# Patient Record
Sex: Male | Born: 1965 | Race: White | Hispanic: No | Smoking: Former smoker
Health system: Southern US, Community
[De-identification: ages and names within clinical notes are randomized; demographics above are authoritative.]

## PROBLEM LIST (undated history)

## (undated) DIAGNOSIS — L089 Local infection of the skin and subcutaneous tissue, unspecified: Secondary | ICD-10-CM

## (undated) DIAGNOSIS — N289 Disorder of kidney and ureter, unspecified: Secondary | ICD-10-CM

## (undated) DIAGNOSIS — L97323 Non-pressure chronic ulcer of left ankle with necrosis of muscle: Secondary | ICD-10-CM

## (undated) DIAGNOSIS — I10 Essential (primary) hypertension: Secondary | ICD-10-CM

## (undated) DIAGNOSIS — L98492 Non-pressure chronic ulcer of skin of other sites with fat layer exposed: Secondary | ICD-10-CM

## (undated) DIAGNOSIS — C801 Malignant (primary) neoplasm, unspecified: Secondary | ICD-10-CM

## (undated) DIAGNOSIS — M86172 Other acute osteomyelitis, left ankle and foot: Secondary | ICD-10-CM

## (undated) DIAGNOSIS — E119 Type 2 diabetes mellitus without complications: Secondary | ICD-10-CM

## (undated) DIAGNOSIS — Z22322 Carrier or suspected carrier of Methicillin resistant Staphylococcus aureus: Secondary | ICD-10-CM

## (undated) DIAGNOSIS — E11628 Type 2 diabetes mellitus with other skin complications: Secondary | ICD-10-CM

## (undated) DIAGNOSIS — H547 Unspecified visual loss: Secondary | ICD-10-CM

## (undated) HISTORY — PX: SKIN GRAFT: SHX250

## (undated) HISTORY — DX: Non-pressure chronic ulcer of skin of other sites with fat layer exposed: L98.492

## (undated) HISTORY — DX: Non-pressure chronic ulcer of left ankle with necrosis of muscle: L97.323

## (undated) HISTORY — DX: Local infection of the skin and subcutaneous tissue, unspecified: L08.9

## (undated) HISTORY — DX: Other acute osteomyelitis, left ankle and foot: M86.172

## (undated) HISTORY — PX: TOE AMPUTATION: SHX809

---

## 2003-11-15 ENCOUNTER — Emergency Department (HOSPITAL_COMMUNITY): Admission: EM | Admit: 2003-11-15 | Discharge: 2003-11-15 | Payer: Self-pay | Admitting: Emergency Medicine

## 2004-08-12 HISTORY — PX: KIDNEY TRANSPLANT: SHX239

## 2007-07-14 ENCOUNTER — Ambulatory Visit: Payer: Self-pay | Admitting: Pain Medicine

## 2010-09-12 ENCOUNTER — Ambulatory Visit: Payer: BC Managed Care – PPO

## 2010-09-12 DIAGNOSIS — J069 Acute upper respiratory infection, unspecified: Secondary | ICD-10-CM

## 2010-09-12 DIAGNOSIS — J209 Acute bronchitis, unspecified: Secondary | ICD-10-CM

## 2010-09-20 ENCOUNTER — Encounter: Payer: Self-pay | Admitting: Internal Medicine

## 2010-09-27 NOTE — Letter (Signed)
Summary: medication list  medication list   Imported By: Orma Flaming 09/20/2010 10:22:09  _____________________________________________________________________  External Attachment:    Type:   Image     Comment:   External Document

## 2010-09-27 NOTE — Progress Notes (Signed)
Summary: Office Visit-09/12/10  Office Visit-09/12/10   Imported By: Orma Flaming 09/20/2010 10:16:59  _____________________________________________________________________  External Attachment:    Type:   Image     Comment:   External Document

## 2010-09-27 NOTE — Progress Notes (Signed)
Summary: office visit-09/12/10  office visit-09/12/10   Imported By: Orma Flaming 09/20/2010 10:18:51  _____________________________________________________________________  External Attachment:    Type:   Image     Comment:   External Document

## 2013-04-20 ENCOUNTER — Emergency Department: Payer: Self-pay | Admitting: Emergency Medicine

## 2013-04-20 LAB — COMPREHENSIVE METABOLIC PANEL
Alkaline Phosphatase: 105 U/L (ref 50–136)
BUN: 16 mg/dL (ref 7–18)
Bilirubin,Total: 0.7 mg/dL (ref 0.2–1.0)
Calcium, Total: 10 mg/dL (ref 8.5–10.1)
Chloride: 104 mmol/L (ref 98–107)
Creatinine: 1.69 mg/dL — ABNORMAL HIGH (ref 0.60–1.30)
EGFR (Non-African Amer.): 48 — ABNORMAL LOW
Glucose: 94 mg/dL (ref 65–99)
Potassium: 4.1 mmol/L (ref 3.5–5.1)
SGOT(AST): 22 U/L (ref 15–37)
SGPT (ALT): 17 U/L (ref 12–78)
Sodium: 133 mmol/L — ABNORMAL LOW (ref 136–145)
Total Protein: 8.2 g/dL (ref 6.4–8.2)

## 2013-04-20 LAB — URINALYSIS, COMPLETE
Glucose,UR: NEGATIVE mg/dL (ref 0–75)
Leukocyte Esterase: NEGATIVE
Nitrite: NEGATIVE
RBC,UR: NONE SEEN /HPF (ref 0–5)

## 2013-04-20 LAB — CBC
HCT: 46.9 % (ref 40.0–52.0)
RDW: 14.1 % (ref 11.5–14.5)
WBC: 29.3 10*3/uL — ABNORMAL HIGH (ref 3.8–10.6)

## 2013-04-21 LAB — COMPREHENSIVE METABOLIC PANEL
Albumin: 3.5 g/dL (ref 3.4–5.0)
Calcium, Total: 9 mg/dL (ref 8.5–10.1)
Creatinine: 1.88 mg/dL — ABNORMAL HIGH (ref 0.60–1.30)
EGFR (African American): 49 — ABNORMAL LOW
Osmolality: 277 (ref 275–301)
Sodium: 134 mmol/L — ABNORMAL LOW (ref 136–145)
Total Protein: 6.9 g/dL (ref 6.4–8.2)

## 2013-04-21 LAB — CBC WITH DIFFERENTIAL/PLATELET
Basophil %: 0.2 %
Eosinophil %: 0.1 %
Lymphocyte #: 4.8 10*3/uL — ABNORMAL HIGH (ref 1.0–3.6)
MCH: 29.4 pg (ref 26.0–34.0)
MCHC: 33.3 g/dL (ref 32.0–36.0)
Neutrophil %: 75.5 %
RBC: 4.71 10*6/uL (ref 4.40–5.90)

## 2013-04-25 LAB — CULTURE, BLOOD (SINGLE)

## 2013-06-19 ENCOUNTER — Emergency Department: Payer: Self-pay | Admitting: Emergency Medicine

## 2013-06-19 LAB — COMPREHENSIVE METABOLIC PANEL
Albumin: 4.2 g/dL (ref 3.4–5.0)
Anion Gap: 3 — ABNORMAL LOW (ref 7–16)
Bilirubin,Total: 0.9 mg/dL (ref 0.2–1.0)
Calcium, Total: 9.9 mg/dL (ref 8.5–10.1)
Creatinine: 0.82 mg/dL (ref 0.60–1.30)
EGFR (African American): >60
EGFR (Non-African Amer.): >60
Potassium: 3.9 mmol/L (ref 3.5–5.1)
SGOT(AST): 27 U/L (ref 15–37)
SGPT (ALT): 23 U/L (ref 12–78)
Sodium: 119 mmol/L — CL (ref 136–145)
Total Protein: 8 g/dL (ref 6.4–8.2)

## 2013-06-19 LAB — CBC
HCT: 39.1 % — ABNORMAL LOW (ref 40.0–52.0)
MCHC: 35 g/dL (ref 32.0–36.0)
MCV: 84 fL (ref 80–100)
RDW: 14.1 % (ref 11.5–14.5)
WBC: 19.8 10*3/uL — ABNORMAL HIGH (ref 3.8–10.6)

## 2013-06-19 LAB — URINALYSIS, COMPLETE
Bacteria: NONE SEEN
Bilirubin,UR: NEGATIVE
Glucose,UR: NEGATIVE mg/dL (ref 0–75)
Leukocyte Esterase: NEGATIVE
Nitrite: NEGATIVE
Specific Gravity: 1.005 (ref 1.003–1.030)
WBC UR: 1 /HPF (ref 0–5)

## 2017-08-21 ENCOUNTER — Encounter (HOSPITAL_COMMUNITY): Payer: Self-pay | Admitting: Emergency Medicine

## 2017-08-21 ENCOUNTER — Emergency Department (HOSPITAL_COMMUNITY): Payer: Medicare Other

## 2017-08-21 ENCOUNTER — Inpatient Hospital Stay (HOSPITAL_COMMUNITY)
Admission: EM | Admit: 2017-08-21 | Discharge: 2017-08-26 | DRG: 617 | Disposition: A | Discharge status: Home / Self Care | Payer: Medicare Other | Attending: Oncology | Admitting: Oncology

## 2017-08-21 ENCOUNTER — Other Ambulatory Visit: Payer: Self-pay

## 2017-08-21 DIAGNOSIS — I129 Hypertensive chronic kidney disease with stage 1 through stage 4 chronic kidney disease, or unspecified chronic kidney disease: Secondary | ICD-10-CM | POA: Diagnosis present

## 2017-08-21 DIAGNOSIS — M86172 Other acute osteomyelitis, left ankle and foot: Secondary | ICD-10-CM | POA: Diagnosis present

## 2017-08-21 DIAGNOSIS — H547 Unspecified visual loss: Secondary | ICD-10-CM | POA: Diagnosis present

## 2017-08-21 DIAGNOSIS — W19XXXA Unspecified fall, initial encounter: Secondary | ICD-10-CM | POA: Diagnosis not present

## 2017-08-21 DIAGNOSIS — I10 Essential (primary) hypertension: Secondary | ICD-10-CM | POA: Diagnosis not present

## 2017-08-21 DIAGNOSIS — E1022 Type 1 diabetes mellitus with diabetic chronic kidney disease: Secondary | ICD-10-CM | POA: Diagnosis present

## 2017-08-21 DIAGNOSIS — Z823 Family history of stroke: Secondary | ICD-10-CM | POA: Diagnosis not present

## 2017-08-21 DIAGNOSIS — Z888 Allergy status to other drugs, medicaments and biological substances status: Secondary | ICD-10-CM

## 2017-08-21 DIAGNOSIS — I70209 Unspecified atherosclerosis of native arteries of extremities, unspecified extremity: Secondary | ICD-10-CM

## 2017-08-21 DIAGNOSIS — M869 Osteomyelitis, unspecified: Secondary | ICD-10-CM | POA: Diagnosis not present

## 2017-08-21 DIAGNOSIS — Z794 Long term (current) use of insulin: Secondary | ICD-10-CM

## 2017-08-21 DIAGNOSIS — S88112D Complete traumatic amputation at level between knee and ankle, left lower leg, subsequent encounter: Secondary | ICD-10-CM | POA: Diagnosis not present

## 2017-08-21 DIAGNOSIS — L97323 Non-pressure chronic ulcer of left ankle with necrosis of muscle: Secondary | ICD-10-CM | POA: Diagnosis present

## 2017-08-21 DIAGNOSIS — I70208 Unspecified atherosclerosis of native arteries of extremities, other extremity: Secondary | ICD-10-CM | POA: Diagnosis not present

## 2017-08-21 DIAGNOSIS — B955 Unspecified streptococcus as the cause of diseases classified elsewhere: Secondary | ICD-10-CM | POA: Diagnosis not present

## 2017-08-21 DIAGNOSIS — N186 End stage renal disease: Secondary | ICD-10-CM | POA: Diagnosis not present

## 2017-08-21 DIAGNOSIS — L97429 Non-pressure chronic ulcer of left heel and midfoot with unspecified severity: Secondary | ICD-10-CM | POA: Diagnosis not present

## 2017-08-21 DIAGNOSIS — E785 Hyperlipidemia, unspecified: Secondary | ICD-10-CM | POA: Diagnosis present

## 2017-08-21 DIAGNOSIS — D649 Anemia, unspecified: Secondary | ICD-10-CM | POA: Diagnosis not present

## 2017-08-21 DIAGNOSIS — N183 Chronic kidney disease, stage 3 unspecified: Secondary | ICD-10-CM

## 2017-08-21 DIAGNOSIS — D72829 Elevated white blood cell count, unspecified: Secondary | ICD-10-CM | POA: Diagnosis not present

## 2017-08-21 DIAGNOSIS — D631 Anemia in chronic kidney disease: Secondary | ICD-10-CM | POA: Diagnosis present

## 2017-08-21 DIAGNOSIS — K5900 Constipation, unspecified: Secondary | ICD-10-CM | POA: Diagnosis present

## 2017-08-21 DIAGNOSIS — L97322 Non-pressure chronic ulcer of left ankle with fat layer exposed: Secondary | ICD-10-CM | POA: Diagnosis present

## 2017-08-21 DIAGNOSIS — M5430 Sciatica, unspecified side: Secondary | ICD-10-CM | POA: Diagnosis present

## 2017-08-21 DIAGNOSIS — N179 Acute kidney failure, unspecified: Secondary | ICD-10-CM | POA: Diagnosis present

## 2017-08-21 DIAGNOSIS — Z79899 Other long term (current) drug therapy: Secondary | ICD-10-CM | POA: Diagnosis not present

## 2017-08-21 DIAGNOSIS — N189 Chronic kidney disease, unspecified: Secondary | ICD-10-CM | POA: Diagnosis not present

## 2017-08-21 DIAGNOSIS — D62 Acute posthemorrhagic anemia: Secondary | ICD-10-CM

## 2017-08-21 DIAGNOSIS — M549 Dorsalgia, unspecified: Secondary | ICD-10-CM | POA: Diagnosis not present

## 2017-08-21 DIAGNOSIS — E10621 Type 1 diabetes mellitus with foot ulcer: Secondary | ICD-10-CM | POA: Diagnosis not present

## 2017-08-21 DIAGNOSIS — E1069 Type 1 diabetes mellitus with other specified complication: Principal | ICD-10-CM | POA: Diagnosis present

## 2017-08-21 DIAGNOSIS — E1142 Type 2 diabetes mellitus with diabetic polyneuropathy: Secondary | ICD-10-CM | POA: Diagnosis not present

## 2017-08-21 DIAGNOSIS — R1030 Lower abdominal pain, unspecified: Secondary | ICD-10-CM | POA: Diagnosis not present

## 2017-08-21 DIAGNOSIS — E1042 Type 1 diabetes mellitus with diabetic polyneuropathy: Secondary | ICD-10-CM | POA: Diagnosis present

## 2017-08-21 DIAGNOSIS — R7881 Bacteremia: Secondary | ICD-10-CM | POA: Diagnosis present

## 2017-08-21 DIAGNOSIS — G8929 Other chronic pain: Secondary | ICD-10-CM | POA: Diagnosis not present

## 2017-08-21 DIAGNOSIS — Z940 Kidney transplant status: Secondary | ICD-10-CM

## 2017-08-21 DIAGNOSIS — Z89512 Acquired absence of left leg below knee: Secondary | ICD-10-CM

## 2017-08-21 DIAGNOSIS — R1032 Left lower quadrant pain: Secondary | ICD-10-CM | POA: Diagnosis not present

## 2017-08-21 DIAGNOSIS — Z978 Presence of other specified devices: Secondary | ICD-10-CM | POA: Diagnosis not present

## 2017-08-21 DIAGNOSIS — G8918 Other acute postprocedural pain: Secondary | ICD-10-CM | POA: Diagnosis not present

## 2017-08-21 DIAGNOSIS — E1040 Type 1 diabetes mellitus with diabetic neuropathy, unspecified: Secondary | ICD-10-CM | POA: Diagnosis present

## 2017-08-21 DIAGNOSIS — S88112S Complete traumatic amputation at level between knee and ankle, left lower leg, sequela: Secondary | ICD-10-CM | POA: Diagnosis not present

## 2017-08-21 DIAGNOSIS — Z9483 Pancreas transplant status: Secondary | ICD-10-CM | POA: Diagnosis not present

## 2017-08-21 DIAGNOSIS — E10319 Type 1 diabetes mellitus with unspecified diabetic retinopathy without macular edema: Secondary | ICD-10-CM | POA: Diagnosis not present

## 2017-08-21 DIAGNOSIS — Z8614 Personal history of Methicillin resistant Staphylococcus aureus infection: Secondary | ICD-10-CM

## 2017-08-21 DIAGNOSIS — E1051 Type 1 diabetes mellitus with diabetic peripheral angiopathy without gangrene: Secondary | ICD-10-CM | POA: Diagnosis not present

## 2017-08-21 DIAGNOSIS — Z7952 Long term (current) use of systemic steroids: Secondary | ICD-10-CM

## 2017-08-21 DIAGNOSIS — E1049 Type 1 diabetes mellitus with other diabetic neurological complication: Secondary | ICD-10-CM | POA: Diagnosis not present

## 2017-08-21 DIAGNOSIS — I120 Hypertensive chronic kidney disease with stage 5 chronic kidney disease or end stage renal disease: Secondary | ICD-10-CM | POA: Diagnosis not present

## 2017-08-21 DIAGNOSIS — B954 Other streptococcus as the cause of diseases classified elsewhere: Secondary | ICD-10-CM | POA: Diagnosis not present

## 2017-08-21 DIAGNOSIS — Z4781 Encounter for orthopedic aftercare following surgical amputation: Secondary | ICD-10-CM | POA: Diagnosis not present

## 2017-08-21 DIAGNOSIS — L98492 Non-pressure chronic ulcer of skin of other sites with fat layer exposed: Secondary | ICD-10-CM

## 2017-08-21 DIAGNOSIS — Z8249 Family history of ischemic heart disease and other diseases of the circulatory system: Secondary | ICD-10-CM

## 2017-08-21 DIAGNOSIS — E10622 Type 1 diabetes mellitus with other skin ulcer: Secondary | ICD-10-CM | POA: Diagnosis not present

## 2017-08-21 DIAGNOSIS — Z90410 Acquired total absence of pancreas: Secondary | ICD-10-CM | POA: Diagnosis not present

## 2017-08-21 DIAGNOSIS — Z833 Family history of diabetes mellitus: Secondary | ICD-10-CM

## 2017-08-21 DIAGNOSIS — E871 Hypo-osmolality and hyponatremia: Secondary | ICD-10-CM | POA: Diagnosis not present

## 2017-08-21 DIAGNOSIS — L97529 Non-pressure chronic ulcer of other part of left foot with unspecified severity: Secondary | ICD-10-CM | POA: Diagnosis not present

## 2017-08-21 DIAGNOSIS — L089 Local infection of the skin and subcutaneous tissue, unspecified: Secondary | ICD-10-CM | POA: Diagnosis present

## 2017-08-21 DIAGNOSIS — E891 Postprocedural hypoinsulinemia: Secondary | ICD-10-CM | POA: Diagnosis not present

## 2017-08-21 DIAGNOSIS — L03116 Cellulitis of left lower limb: Secondary | ICD-10-CM | POA: Diagnosis not present

## 2017-08-21 HISTORY — DX: Disorder of kidney and ureter, unspecified: N28.9

## 2017-08-21 HISTORY — DX: Essential (primary) hypertension: I10

## 2017-08-21 HISTORY — DX: Local infection of the skin and subcutaneous tissue, unspecified: E11.628

## 2017-08-21 HISTORY — DX: Unspecified visual loss: H54.7

## 2017-08-21 HISTORY — DX: Carrier or suspected carrier of methicillin resistant Staphylococcus aureus: Z22.322

## 2017-08-21 HISTORY — DX: Type 2 diabetes mellitus with other skin complications: L08.9

## 2017-08-21 HISTORY — DX: Type 2 diabetes mellitus without complications: E11.9

## 2017-08-21 LAB — COMPREHENSIVE METABOLIC PANEL
ALBUMIN: 2.7 g/dL — AB (ref 3.5–5.0)
ALT: 9 U/L — ABNORMAL LOW (ref 17–63)
ANION GAP: 11 (ref 5–15)
AST: 13 U/L — ABNORMAL LOW (ref 15–41)
Alkaline Phosphatase: 116 U/L (ref 38–126)
BILIRUBIN TOTAL: 1 mg/dL (ref 0.3–1.2)
BUN: 17 mg/dL (ref 6–20)
CO2: 20 mmol/L — ABNORMAL LOW (ref 22–32)
Calcium: 8.7 mg/dL — ABNORMAL LOW (ref 8.9–10.3)
Chloride: 98 mmol/L — ABNORMAL LOW (ref 101–111)
Creatinine, Ser: 1.26 mg/dL — ABNORMAL HIGH (ref 0.61–1.24)
GFR calc non Af Amer: >60 mL/min (ref >60)
GLUCOSE: 312 mg/dL — AB (ref 65–99)
POTASSIUM: 4.2 mmol/L (ref 3.5–5.1)
SODIUM: 129 mmol/L — AB (ref 135–145)
Total Protein: 6.5 g/dL (ref 6.5–8.1)

## 2017-08-21 LAB — CBC WITH DIFFERENTIAL/PLATELET
BASOS ABS: 0 10*3/uL (ref 0.0–0.1)
BASOS PCT: 0 %
Eosinophils Absolute: 0.1 10*3/uL (ref 0.0–0.7)
Eosinophils Relative: 0 %
HEMATOCRIT: 34.5 % — AB (ref 39.0–52.0)
HEMOGLOBIN: 11.5 g/dL — AB (ref 13.0–17.0)
LYMPHS PCT: 6 %
Lymphs Abs: 1.1 10*3/uL (ref 0.7–4.0)
MCH: 28.4 pg (ref 26.0–34.0)
MCHC: 33.3 g/dL (ref 30.0–36.0)
MCV: 85.2 fL (ref 78.0–100.0)
MONO ABS: 0.5 10*3/uL (ref 0.1–1.0)
Monocytes Relative: 3 %
NEUTROS ABS: 16.5 10*3/uL — AB (ref 1.7–7.7)
NEUTROS PCT: 91 %
Platelets: 402 10*3/uL — ABNORMAL HIGH (ref 150–400)
RBC: 4.05 MIL/uL — AB (ref 4.22–5.81)
RDW: 13.2 % (ref 11.5–15.5)
WBC: 18.2 10*3/uL — ABNORMAL HIGH (ref 4.0–10.5)

## 2017-08-21 LAB — HEMOGLOBIN A1C
HEMOGLOBIN A1C: 8.6 % — AB (ref 4.8–5.6)
MEAN PLASMA GLUCOSE: 200.12 mg/dL

## 2017-08-21 LAB — GLUCOSE, CAPILLARY: GLUCOSE-CAPILLARY: 97 mg/dL (ref 65–99)

## 2017-08-21 LAB — I-STAT CG4 LACTIC ACID, ED: Lactic Acid, Venous: 1.34 mmol/L (ref 0.5–1.9)

## 2017-08-21 LAB — LIPASE, BLOOD: Lipase: 16 U/L (ref 11–51)

## 2017-08-21 MED ORDER — ACETAMINOPHEN 325 MG PO TABS
650.0000 mg | ORAL_TABLET | Freq: Once | ORAL | Status: AC
  Administered 2017-08-21: 650 mg via ORAL
  Filled 2017-08-21: qty 2

## 2017-08-21 MED ORDER — SODIUM CHLORIDE 0.9 % IV SOLN: 250.0000 mL | INTRAVENOUS | Status: DC | PRN

## 2017-08-21 MED ORDER — HYDROMORPHONE HCL 1 MG/ML IJ SOLN
1.0000 mg | Freq: Once | INTRAMUSCULAR | Status: AC
  Administered 2017-08-21: 1 mg via INTRAVENOUS
  Filled 2017-08-21: qty 1

## 2017-08-21 MED ORDER — VANCOMYCIN HCL IN DEXTROSE 1-5 GM/200ML-% IV SOLN: 1000.0000 mg | Freq: Once | INTRAVENOUS | Status: DC

## 2017-08-21 MED ORDER — PIPERACILLIN-TAZOBACTAM 3.375 G IVPB 30 MIN
3.3750 g | Freq: Once | INTRAVENOUS | Status: AC
  Administered 2017-08-21: 3.375 g via INTRAVENOUS
  Filled 2017-08-21: qty 50

## 2017-08-21 MED ORDER — SODIUM CHLORIDE 0.9% FLUSH
3.0000 mL | Freq: Two times a day (BID) | INTRAVENOUS | Status: DC
  Administered 2017-08-21 – 2017-08-23 (×2): 3 mL via INTRAVENOUS

## 2017-08-21 MED ORDER — CARVEDILOL 25 MG PO TABS
25.0000 mg | ORAL_TABLET | Freq: Two times a day (BID) | ORAL | Status: DC
  Administered 2017-08-21 – 2017-08-26 (×10): 25 mg via ORAL
  Filled 2017-08-21 (×11): qty 1

## 2017-08-21 MED ORDER — SODIUM CHLORIDE 0.9% FLUSH
3.0000 mL | Freq: Two times a day (BID) | INTRAVENOUS | Status: DC
  Administered 2017-08-22 – 2017-08-26 (×3): 3 mL via INTRAVENOUS

## 2017-08-21 MED ORDER — PIPERACILLIN-TAZOBACTAM 3.375 G IVPB
3.3750 g | Freq: Three times a day (TID) | INTRAVENOUS | Status: DC
  Administered 2017-08-21: 3.375 g via INTRAVENOUS
  Filled 2017-08-21: qty 50

## 2017-08-21 MED ORDER — HYDROMORPHONE HCL 1 MG/ML IJ SOLN
1.0000 mg | Freq: Once | INTRAMUSCULAR | Status: AC
Start: 2017-08-21 — End: 2017-08-21
  Administered 2017-08-21: 1 mg via INTRAVENOUS
  Filled 2017-08-21: qty 1

## 2017-08-21 MED ORDER — ONDANSETRON HCL 4 MG/2ML IJ SOLN
4.0000 mg | Freq: Once | INTRAMUSCULAR | Status: AC
  Administered 2017-08-21: 4 mg via INTRAVENOUS
  Filled 2017-08-21: qty 2

## 2017-08-21 MED ORDER — VITAMIN B-12 1000 MCG PO TABS
500.0000 ug | ORAL_TABLET | Freq: Every day | ORAL | Status: DC
  Administered 2017-08-22 – 2017-08-26 (×5): 500 ug via ORAL
  Filled 2017-08-21 (×5): qty 1

## 2017-08-21 MED ORDER — PANTOPRAZOLE SODIUM 40 MG PO TBEC
40.0000 mg | DELAYED_RELEASE_TABLET | Freq: Every day | ORAL | Status: DC
  Administered 2017-08-21 – 2017-08-26 (×6): 40 mg via ORAL
  Filled 2017-08-21 (×6): qty 1

## 2017-08-21 MED ORDER — VANCOMYCIN HCL IN DEXTROSE 1-5 GM/200ML-% IV SOLN
1000.0000 mg | Freq: Two times a day (BID) | INTRAVENOUS | Status: DC
  Administered 2017-08-21 – 2017-08-24 (×6): 1000 mg via INTRAVENOUS
  Filled 2017-08-21 (×6): qty 200

## 2017-08-21 MED ORDER — MYCOPHENOLATE MOFETIL 250 MG PO CAPS
500.0000 mg | ORAL_CAPSULE | Freq: Two times a day (BID) | ORAL | Status: DC
  Administered 2017-08-21 – 2017-08-26 (×11): 500 mg via ORAL
  Filled 2017-08-21 (×12): qty 2

## 2017-08-21 MED ORDER — RAMIPRIL 10 MG PO CAPS
20.0000 mg | ORAL_CAPSULE | Freq: Every day | ORAL | Status: DC
Start: 2017-08-21 — End: 2017-08-26
  Administered 2017-08-21 – 2017-08-26 (×6): 20 mg via ORAL
  Filled 2017-08-21 (×6): qty 2

## 2017-08-21 MED ORDER — DOCUSATE SODIUM 100 MG PO CAPS
100.0000 mg | ORAL_CAPSULE | Freq: Two times a day (BID) | ORAL | Status: DC
  Administered 2017-08-21 – 2017-08-22 (×3): 100 mg via ORAL
  Filled 2017-08-21 (×3): qty 1

## 2017-08-21 MED ORDER — ENOXAPARIN SODIUM 40 MG/0.4ML ~~LOC~~ SOLN: 40.0000 mg | SUBCUTANEOUS | Status: DC

## 2017-08-21 MED ORDER — INSULIN ASPART 100 UNIT/ML ~~LOC~~ SOLN
0.0000 [IU] | Freq: Three times a day (TID) | SUBCUTANEOUS | Status: DC
Start: 2017-08-21 — End: 2017-08-21

## 2017-08-21 MED ORDER — INSULIN GLARGINE 100 UNIT/ML ~~LOC~~ SOLN
28.0000 [IU] | Freq: Every day | SUBCUTANEOUS | Status: DC
  Administered 2017-08-21: 28 [IU] via SUBCUTANEOUS
  Filled 2017-08-21 (×2): qty 0.28

## 2017-08-21 MED ORDER — OXYCODONE HCL 5 MG PO TABS
15.0000 mg | ORAL_TABLET | ORAL | Status: DC | PRN
  Administered 2017-08-21 – 2017-08-23 (×7): 15 mg via ORAL
  Filled 2017-08-21 (×7): qty 3

## 2017-08-21 MED ORDER — TACROLIMUS 1 MG PO CAPS
1.0000 mg | ORAL_CAPSULE | Freq: Two times a day (BID) | ORAL | Status: DC
  Administered 2017-08-21 – 2017-08-26 (×11): 1 mg via ORAL
  Filled 2017-08-21 (×12): qty 1

## 2017-08-21 MED ORDER — INSULIN ASPART 100 UNIT/ML ~~LOC~~ SOLN: 0.0000 [IU] | Freq: Every day | SUBCUTANEOUS | Status: DC

## 2017-08-21 MED ORDER — INSULIN ASPART 100 UNIT/ML ~~LOC~~ SOLN
0.0000 [IU] | Freq: Three times a day (TID) | SUBCUTANEOUS | Status: DC
Start: 2017-08-21 — End: 2017-08-23
  Administered 2017-08-22 – 2017-08-23 (×2): 11 [IU] via SUBCUTANEOUS
  Administered 2017-08-23: 5 [IU] via SUBCUTANEOUS

## 2017-08-21 MED ORDER — SODIUM CHLORIDE 0.9 % IV BOLUS (SEPSIS)
1000.0000 mL | Freq: Once | INTRAVENOUS | Status: AC
  Administered 2017-08-21: 1000 mL via INTRAVENOUS

## 2017-08-21 MED ORDER — CLONIDINE HCL 0.1 MG PO TABS
0.1000 mg | ORAL_TABLET | Freq: Two times a day (BID) | ORAL | Status: DC
  Administered 2017-08-21 – 2017-08-26 (×10): 0.1 mg via ORAL
  Filled 2017-08-21 (×11): qty 1

## 2017-08-21 MED ORDER — HYDROMORPHONE HCL 1 MG/ML IJ SOLN
0.5000 mg | INTRAMUSCULAR | Status: DC | PRN
  Administered 2017-08-21 – 2017-08-23 (×7): 0.5 mg via INTRAVENOUS
  Filled 2017-08-21 (×7): qty 0.5

## 2017-08-21 MED ORDER — GADOBENATE DIMEGLUMINE 529 MG/ML IV SOLN
15.0000 mL | Freq: Once | INTRAVENOUS | Status: AC
  Administered 2017-08-21: 15 mL via INTRAVENOUS

## 2017-08-21 MED ORDER — ATORVASTATIN CALCIUM 40 MG PO TABS
40.0000 mg | ORAL_TABLET | Freq: Every evening | ORAL | Status: DC
  Administered 2017-08-21 – 2017-08-25 (×5): 40 mg via ORAL
  Filled 2017-08-21 (×6): qty 1

## 2017-08-21 MED ORDER — PREDNISONE 5 MG PO TABS
5.0000 mg | ORAL_TABLET | Freq: Every day | ORAL | Status: DC
  Administered 2017-08-21 – 2017-08-26 (×6): 5 mg via ORAL
  Filled 2017-08-21 (×6): qty 1

## 2017-08-21 MED ORDER — DEXTROSE 5 % IV SOLN
2.0000 g | INTRAVENOUS | Status: DC
  Administered 2017-08-21: 2 g via INTRAVENOUS
  Filled 2017-08-21 (×2): qty 2

## 2017-08-21 MED ORDER — SODIUM CHLORIDE 0.9% FLUSH: 3.0000 mL | INTRAVENOUS | Status: DC | PRN

## 2017-08-21 MED ORDER — AMLODIPINE BESYLATE 10 MG PO TABS
10.0000 mg | ORAL_TABLET | Freq: Every day | ORAL | Status: DC
  Administered 2017-08-21 – 2017-08-26 (×6): 10 mg via ORAL
  Filled 2017-08-21 (×6): qty 1

## 2017-08-21 MED ORDER — VANCOMYCIN HCL 10 G IV SOLR
1500.0000 mg | Freq: Once | INTRAVENOUS | Status: AC
  Administered 2017-08-21: 1500 mg via INTRAVENOUS
  Filled 2017-08-21: qty 1500

## 2017-08-21 MED ORDER — ONDANSETRON 4 MG PO TBDP
4.0000 mg | ORAL_TABLET | Freq: Once | ORAL | Status: AC | PRN
  Administered 2017-08-21: 4 mg via ORAL
  Filled 2017-08-21: qty 1

## 2017-08-21 NOTE — ED Provider Notes (Signed)
8:38 AM Care assumed from Dr. Roxanne Mins.  At time of transfer of care, patient is awaiting MRI results to evaluate for osteomyelitis.  After imaging is completed, patient will require admission for further management of foot infection in immunocompromised patient.  Patient has already received broad-spectrum antibiotics.  MRI returned showing evidence of osteomyelitis.   Patient will be admitted for further management of infection.  Patient given more Dilaudid for pain management.    Clinical Impression: 1. Infected ulcer of skin, with fat layer exposed ([place])   2. Renal transplant recipient   3. Anemia in chronic kidney disease, unspecified CKD stage     Disposition: Admit  This note was prepared with assistance of Dragon voice recognition software. Occasional wrong-word or sound-a-like substitutions may have occurred due to the inherent limitations of voice recognition software.     Anaja Monts, Gwenyth Allegra, MD 08/21/17 1140

## 2017-08-21 NOTE — ED Notes (Signed)
Patient stood without assistance to use a uranal

## 2017-08-21 NOTE — ED Notes (Signed)
Patient transported to MRI 

## 2017-08-21 NOTE — ED Notes (Signed)
PT in MRI at this time.

## 2017-08-21 NOTE — Progress Notes (Signed)
Pharmacy Antibiotic Note  Arthur Burke is a 52 y.o. male admitted on 08/21/2017 with fever, chills and N/V. Pt is s/p kidney transplant in 2005. Presents with WBC 18, SCr 1.26, eCrCl 70-75, LA wnl. Starting empiric abx for cellulitis of L foot and ankle.   Plan: -Vancomycin 1500 mg IV x1 then 1g/12h -Zosyn 3.375 g IV q8h -Monitor renal fx, cultures, VR as needed -F/u MRI for rule out MRI   Height: 5\' 11"  (180.3 cm) Weight: 178 lb (80.7 kg) IBW/kg (Calculated) : 75.3  Temp (24hrs), Avg:100.6 F (38.1 C), Min:100.6 F (38.1 C), Max:100.6 F (38.1 C)  No results for input(s): WBC, CREATININE, LATICACIDVEN, VANCOTROUGH, VANCOPEAK, VANCORANDOM, GENTTROUGH, GENTPEAK, GENTRANDOM, TOBRATROUGH, TOBRAPEAK, TOBRARND, AMIKACINPEAK, AMIKACINTROU, AMIKACIN in the last 168 hours.  CrCl cannot be calculated (Patient's most recent lab result is older than the maximum 21 days allowed.).    Allergies  Allergen Reactions  . Ambien [Zolpidem]     Antimicrobials this admission: 1/10 vancomycin > 1/10 zosyn >  Dose adjustments this admission: N/A  Microbiology results: 1/10 blood cx: 1/10 wound cx:   Arthur Burke 08/21/2017 4:33 AM

## 2017-08-21 NOTE — ED Provider Notes (Signed)
[facility] EMERGENCY DEPARTMENT Provider Note   CSN: [id number] Arrival date & time: 08/21/17  0343     History   Chief Complaint Chief Complaint  Patient presents with  . Fever  . Chills  . Nausea  . Emesis    HPI Arthur Burke is a 52 y.o. male.  The history is provided by the patient.  He has history of renal transplant, diabetes, hypertension, MRSA and comes in with fever and chills which started tonight.  He has a chronic wound on the left heel and has been in a special boot to keep pressure off of that.  Unfortunately, the strap from the boot has been rubbing on the top of the foot and he noted some breakdown of the skin several months ago.  That has actually been stable until about 3 days ago when he started to notice some foul-smelling drainage from it, and pain has increased.  He currently rates pain at 9/10.  Because of the chills, he did call his transplant team at [facility] and they advised him to come to the nearest emergency department for a sepsis evaluation.  He denies cough, dysuria.  He has had nausea and vomiting.  His renal transplant was in 2005, and he is currently on low-dose prednisone, mycophenolate, and tacrolimus for immunosuppression.  Past Medical History:  Diagnosis Date  . Diabetes mellitus without complication ([place])   . Diabetic foot infection ([place])   . Hypertension   . MRSA (methicillin resistant staph aureus) culture positive   . Renal disorder   . Vision impairment     There are no active problems to display for this patient.   Past Surgical History:  Procedure Laterality Date  . KIDNEY TRANSPLANT  2006   [facility]  . SKIN GRAFT     L diabetic foot wound with debriedment and skin grafting  . TOE AMPUTATION     R great and 2nd toe       Home Medications    Prior to Admission medications   Not on File    Family History History reviewed. No pertinent family history.  Social History Social History   Tobacco Use  .  Smoking status: Never Smoker  . Smokeless tobacco: Never Used  Substance Use Topics  . Alcohol use: No    Frequency: Never  . Drug use: Not on file     Allergies   Ambien [zolpidem]   Review of Systems Review of Systems  All other systems reviewed and are negative.    Physical Exam Updated Vital Signs BP 128/77   Pulse (!) 107   Temp (!) 100.6 F (38.1 C) (Oral)   Resp 18   Ht 5\' 11"  (1.803 m)   Wt 80.7 kg (178 lb)   SpO2 94%   BMI 24.83 kg/m   Physical Exam  Nursing note and vitals reviewed.  52 year old male, resting comfortably and in no acute distress. Vital signs are significant for fever and tachycardia. Oxygen saturation is 94%, which is normal. Head is normocephalic and atraumatic. PERRLA, EOMI. Oropharynx is clear. Neck is nontender and supple without adenopathy or JVD. Back is nontender and there is no CVA tenderness. Lungs are clear without rales, wheezes, or rhonchi. Chest is nontender. Heart has regular rate and rhythm without murmur. Abdomen is soft, flat, nontender without masses or hepatosplenomegaly and peristalsis is normoactive. Extremities: Status post amputation of left first toe.  Wound on left heel is clean appearing and without signs  of infection.  Ulcer on the dorsum of the left foot and anterior aspect of the left ankle has foul-smelling drainage.  There is erythema extending about halfway up the left lower leg. Skin is warm and dry without rash. Neurologic: Mental status is normal, cranial nerves are intact, there are no motor or sensory deficits.  ED Treatments / Results  Labs (all labs ordered are listed, but only abnormal results are displayed) Labs Reviewed  COMPREHENSIVE METABOLIC PANEL - Abnormal; Notable for the following components:      Result Value   Sodium 129 (*)    Chloride 98 (*)    CO2 20 (*)    Glucose, Bld 312 (*)    Creatinine, Ser 1.26 (*)    Calcium 8.7 (*)    Albumin 2.7 (*)    AST 13 (*)    ALT 9 (*)    All  other components within normal limits  CBC WITH DIFFERENTIAL/PLATELET - Abnormal; Notable for the following components:   WBC 18.2 (*)    RBC 4.05 (*)    Hemoglobin 11.5 (*)    HCT 34.5 (*)    Platelets 402 (*)    Neutro Abs 16.5 (*)    All other components within normal limits  CULTURE, BLOOD (ROUTINE X 2)  CULTURE, BLOOD (ROUTINE X 2)  AEROBIC CULTURE (SUPERFICIAL SPECIMEN)  LIPASE, BLOOD  URINALYSIS, ROUTINE W REFLEX MICROSCOPIC  I-STAT CG4 LACTIC ACID, ED  I-STAT CG4 LACTIC ACID, ED    EKG  EKG Interpretation  Date/Time:  Thursday August 21 2017 04:37:21 EST Ventricular Rate:  99 PR Interval:    QRS Duration: 90 QT Interval:  321 QTC Calculation: 412 R Axis:   -19 Text Interpretation:  Sinus rhythm Borderline left axis deviation RSR' in V1 or V2, probably normal variant When compared with ECG of 11/15/2003, No significant change was found Confirmed by Delora Fuel ([id number]) on 08/21/2017 4:57:55 AM Also confirmed by Delora Fuel ([id number]), editor Hattie Perch [phone])  on 08/21/2017 6:52:05 AM       Radiology Dg Chest Port 1 View  Result Date: 08/21/2017 CLINICAL DATA:  Fever EXAM: PORTABLE CHEST 1 VIEW COMPARISON:  Chest radiograph 04/20/2013 FINDINGS: The heart size and mediastinal contours are within normal limits. There is mild bibasilar atelectasis. Both lungs are otherwise clear. The visualized skeletal structures are unremarkable. IMPRESSION: Mild bibasilar atelectasis. Electronically Signed   By: Ulyses Jarred M.D.   On: 08/21/2017 04:50    Procedures Procedures (including critical care time)  Medications Ordered in ED Medications  ondansetron (ZOFRAN-ODT) disintegrating tablet 4 mg (not administered)  piperacillin-tazobactam (ZOSYN) IVPB 3.375 g (not administered)  vancomycin (VANCOCIN) IVPB 1000 mg/200 mL premix (not administered)  HYDROmorphone (DILAUDID) injection 1 mg (not administered)  ondansetron (ZOFRAN) injection 4 mg (not administered)  sodium  chloride 0.9 % bolus 1,000 mL (not administered)  acetaminophen (TYLENOL) tablet 650 mg (650 mg Oral Given 08/21/17 0429)     Initial Impression / Assessment and Plan / ED Course  I have reviewed the triage vital signs and the nursing notes.  Pertinent labs & imaging results that were available during my care of the patient were reviewed by me and considered in my medical decision making (see chart for details).  Cellulitis and infected ulcer of the left foot and ankle.  Given history of diabetes and immunosuppression, concern for osteomyelitis.  Old records are reviewed confirming renal transplant status, no admissions that I can see related to infection or sepsis.  Cultures are  obtained of blood and wound, lactic acid level will be checked.  He is started on IV fluids and antibiotics of vancomycin and ampicillin-tazobactam.  He will be sent for MRI to evaluate possible osteomyelitis.   Lactic acid level is normal.  WBC is significantly elevated with left shift.  Creatinine has actually come down from most recent value-no evidence of transplant rejection.  MRI is in process.  Case is signed out to Dr. Sherry Ruffing to evaluate MRI report.  He will need to be admitted for IV antibiotics regardless of results of MRI.  Final Clinical Impressions(s) / ED Diagnoses   Final diagnoses:  Infected ulcer of skin, with fat layer exposed ([place])  Renal transplant recipient  Anemia in chronic kidney disease, unspecified CKD stage    ED Discharge Orders    None       Delora Fuel, MD 58/48/35 [phone]

## 2017-08-21 NOTE — Consult Note (Signed)
Reason for Consult:Left heel osteo Referring Physician: Chryl Heck is an 52 y.o. male with DM, HTN, s/p renal transplant. HPI: Arthur Burke was in his usual state of health when he developed N/V and fevers and chills and presented to the ED at the recommendation of the transplant team at [facility]. He has been struggling with left foot ulcers for 7 years. He sees podiatry ([facility]?) for this. This particular time the anterior strap on his offloading boot rubbed an ulcer on his ankle and it got infected. He does get occasional pain with it.  Past Medical History:  Diagnosis Date  . Diabetes mellitus without complication ([place])   . Diabetic foot infection ([place])   . Hypertension   . MRSA (methicillin resistant staph aureus) culture positive   . Renal disorder   . Vision impairment     Past Surgical History:  Procedure Laterality Date  . KIDNEY TRANSPLANT  2006   [facility]  . SKIN GRAFT     L diabetic foot wound with debriedment and skin grafting  . TOE AMPUTATION     R great and 2nd toe    History reviewed. No pertinent family history.  Social History:  reports that  has never smoked. he has never used smokeless tobacco. He reports that he does not drink alcohol. His drug history is not on file.  Allergies:  Allergies  Allergen Reactions  . Zolpidem Other (See Comments)    Other Reaction: CNS Disorder    Medications: I have reviewed the patient's current medications.  Results for orders placed or performed during the hospital encounter of 08/21/17 (from the past 48 hour(s))  Lipase, blood     Status: None   Collection Time: 08/21/17  4:57 AM  Result Value Ref Range   Lipase 16 11 - 51 U/L  Comprehensive metabolic panel     Status: Abnormal   Collection Time: 08/21/17  4:57 AM  Result Value Ref Range   Sodium 129 (L) 135 - 145 mmol/L   Potassium 4.2 3.5 - 5.1 mmol/L   Chloride 98 (L) 101 - 111 mmol/L   CO2 20 (L) 22 - 32 mmol/L   Glucose, Bld 312 (H) 65 - 99 mg/dL   BUN  17 6 - 20 mg/dL   Creatinine, Ser 1.26 (H) 0.61 - 1.24 mg/dL   Calcium 8.7 (L) 8.9 - 10.3 mg/dL   Total Protein 6.5 6.5 - 8.1 g/dL   Albumin 2.7 (L) 3.5 - 5.0 g/dL   AST 13 (L) 15 - 41 U/L   ALT 9 (L) 17 - 63 U/L   Alkaline Phosphatase 116 38 - 126 U/L   Total Bilirubin 1.0 0.3 - 1.2 mg/dL   GFR calc non Af Amer >60 >60 mL/min   GFR calc Af Amer >60 >60 mL/min    Comment: (NOTE) The eGFR has been calculated using the CKD EPI equation. This calculation has not been validated in all clinical situations. eGFR's persistently <60 mL/min signify possible Chronic Kidney Disease.    Anion gap 11 5 - 15  CBC with Differential     Status: Abnormal   Collection Time: 08/21/17  4:57 AM  Result Value Ref Range   WBC 18.2 (H) 4.0 - 10.5 K/uL   RBC 4.05 (L) 4.22 - 5.81 MIL/uL   Hemoglobin 11.5 (L) 13.0 - 17.0 g/dL   HCT 34.5 (L) 39.0 - 52.0 %   MCV 85.2 78.0 - 100.0 fL   MCH 28.4 26.0 - 34.0 pg  MCHC 33.3 30.0 - 36.0 g/dL   RDW 13.2 11.5 - 15.5 %   Platelets 402 (H) 150 - 400 K/uL   Neutrophils Relative % 91 %   Neutro Abs 16.5 (H) 1.7 - 7.7 K/uL   Lymphocytes Relative 6 %   Lymphs Abs 1.1 0.7 - 4.0 K/uL   Monocytes Relative 3 %   Monocytes Absolute 0.5 0.1 - 1.0 K/uL   Eosinophils Relative 0 %   Eosinophils Absolute 0.1 0.0 - 0.7 K/uL   Basophils Relative 0 %   Basophils Absolute 0.0 0.0 - 0.1 K/uL  Wound or Superficial Culture     Status: None (Preliminary result)   Collection Time: 08/21/17  5:01 AM  Result Value Ref Range   Specimen Description WOUND ANKLE    Special Requests Immunocompromised    Gram Stain      MODERATE WBC PRESENT, PREDOMINANTLY PMN ABUNDANT GRAM NEGATIVE RODS ABUNDANT GRAM POSITIVE COCCI RARE GRAM POSITIVE RODS    Culture PENDING    Report Status PENDING   I-Stat CG4 Lactic Acid, ED  (not at  [facility])     Status: None   Collection Time: 08/21/17  5:21 AM  Result Value Ref Range   Lactic Acid, Venous 1.34 0.5 - 1.9 mmol/L    Mr Foot Left W Wo  Contrast  Result Date: 08/21/2017 CLINICAL DATA:  Left anterior ankle and heel ulcers with fever and chills. Concern for osteomyelitis. History of diabetes and renal transplant. EXAM: MRI OF THE LEFT FOREFOOT WITHOUT AND WITH CONTRAST; MRI OF THE LEFT ANKLE WITHOUT AND WITH CONTRAST TECHNIQUE: Multiplanar, multisequence MR imaging of the left ankle and foot was performed both before and after administration of intravenous contrast. CONTRAST:  95m MULTIHANCE GADOBENATE DIMEGLUMINE 529 MG/ML IV SOLN COMPARISON:  None. FINDINGS: Bones/Joint/Cartilage There is a 2.0 x 1.2 x 1.7 cm (AP by transverse by CC) rim enhancing fluid collection within the calcaneal tuberosity, consistent with abscess, this extends into the plantar soft tissues of the heel, where it measures 4.9 x 4.6 x 2.2 cm, with a small drainage tract to the skin (series 20, image 10). No fracture or dislocation. Prior amputations of the first and second toes. Mild degenerative marrow edema in the midfoot. No joint effusion. Ligaments Ankle ligaments are intact. Lisfranc ligament is intact. Toe collateral ligaments are intact. Muscles and Tendons Soft tissue ulceration over the anterior ankle, with exposed anterior tibialis tendon. There is rim enhancement surrounding the tendon, consistent with tenosynovitis. Remaining flexor, extensor, and peroneal tendons are unremarkable. Moderate Achilles tendinosis. Complete fatty atrophy of the intrinsic muscles of the foot. Soft tissue Plantar heel abscess as described above. Diffuse soft tissue swelling and enhancement along the anterior ankle. IMPRESSION: 1. 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 2. Soft tissue ulceration over the anterior ankle with exposed anterior tibialis tendon and resultant infectious tenosynovitis. Extensive surrounding cellulitis along the anterior lower leg and ankle. 3. No osteomyelitis or soft  tissue abscess within the forefoot. Electronically Signed   By: WTitus DubinM.D.   On: 08/21/2017 09:40   Mr Ankle Left W Wo Contrast  Result Date: 08/21/2017 CLINICAL DATA:  Left anterior ankle and heel ulcers with fever and chills. Concern for osteomyelitis. History of diabetes and renal transplant. EXAM: MRI OF THE LEFT FOREFOOT WITHOUT AND WITH CONTRAST; MRI OF THE LEFT ANKLE WITHOUT AND WITH CONTRAST TECHNIQUE: Multiplanar, multisequence MR imaging of the left ankle and foot was  performed both before and after administration of intravenous contrast. CONTRAST:  30m MULTIHANCE GADOBENATE DIMEGLUMINE 529 MG/ML IV SOLN COMPARISON:  None. FINDINGS: Bones/Joint/Cartilage There is a 2.0 x 1.2 x 1.7 cm (AP by transverse by CC) rim enhancing fluid collection within the calcaneal tuberosity, consistent with abscess, this extends into the plantar soft tissues of the heel, where it measures 4.9 x 4.6 x 2.2 cm, with a small drainage tract to the skin (series 20, image 10). No fracture or dislocation. Prior amputations of the first and second toes. Mild degenerative marrow edema in the midfoot. No joint effusion. Ligaments Ankle ligaments are intact. Lisfranc ligament is intact. Toe collateral ligaments are intact. Muscles and Tendons Soft tissue ulceration over the anterior ankle, with exposed anterior tibialis tendon. There is rim enhancement surrounding the tendon, consistent with tenosynovitis. Remaining flexor, extensor, and peroneal tendons are unremarkable. Moderate Achilles tendinosis. Complete fatty atrophy of the intrinsic muscles of the foot. Soft tissue Plantar heel abscess as described above. Diffuse soft tissue swelling and enhancement along the anterior ankle. IMPRESSION: 1. 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 2. Soft tissue ulceration over the anterior ankle with exposed anterior tibialis tendon and resultant  infectious tenosynovitis. Extensive surrounding cellulitis along the anterior lower leg and ankle. 3. No osteomyelitis or soft tissue abscess within the forefoot. Electronically Signed   By: WTitus DubinM.D.   On: 08/21/2017 09:40   Dg Chest Port 1 View  Result Date: 08/21/2017 CLINICAL DATA:  Fever EXAM: PORTABLE CHEST 1 VIEW COMPARISON:  Chest radiograph 04/20/2013 FINDINGS: The heart size and mediastinal contours are within normal limits. There is mild bibasilar atelectasis. Both lungs are otherwise clear. The visualized skeletal structures are unremarkable. IMPRESSION: Mild bibasilar atelectasis. Electronically Signed   By: KUlyses JarredM.D.   On: 08/21/2017 04:50    Review of Systems  Constitutional: Positive for chills and fever. Negative for weight loss.  HENT: Negative for ear discharge, ear pain, hearing loss and tinnitus.   Eyes: Negative for blurred vision, double vision, photophobia and pain.  Respiratory: Negative for cough, sputum production and shortness of breath.   Cardiovascular: Negative for chest pain.  Gastrointestinal: Positive for nausea and vomiting. Negative for abdominal pain.  Genitourinary: Negative for dysuria, flank pain, frequency and urgency.  Musculoskeletal: Positive for joint pain (Left foot/ankle). Negative for back pain, falls, myalgias and neck pain.  Neurological: Negative for dizziness, tingling, sensory change, focal weakness, loss of consciousness and headaches.  Endo/Heme/Allergies: Does not bruise/bleed easily.  Psychiatric/Behavioral: Negative for depression, memory loss and substance abuse. The patient is not nervous/anxious.    Blood pressure 125/61, pulse 78, temperature (!) 100.4 F (38 C), temperature source Oral, resp. rate 17, height '5\' 11"'  (1.803 m), weight 80.7 kg (178 lb), SpO2 97 %. Physical Exam  Constitutional: He appears well-developed and well-nourished. No distress.  HENT:  Head: Normocephalic.  Eyes: Conjunctivae are normal.  Right eye exhibits no discharge. Left eye exhibits no discharge. No scleral icterus.  Neck: Normal range of motion.  Cardiovascular: Normal rate and regular rhythm.  Respiratory: Effort normal. No respiratory distress.  Musculoskeletal:  LLE No traumatic wounds, ecchymosis, or rash  2x3cm ulceration anterior ankle, purulent discharge, heel pad s/p reconstruction, old ulceration with eschar on bottom  No knee or ankle effusion  Knee stable to varus/ valgus and anterior/posterior stress  Sens DPN, SPN, TN paresthetic, especially DPN  Motor EHL, ext, flex, evers 5/5  DP 1+, PT 1+, No significant edema  Neurological: He is alert.  Skin: Skin is warm and dry. He is not diaphoretic.  Psychiatric: He has a normal mood and affect. His behavior is normal.    Assessment/Plan: Left foot/ankle ulcerations with osteo and cellulitis -- Agree with initial broad-spectrum abx. Will get ABI to check vascularity but anticipate it will be ok. Likely will recommend amputation at this point. Dr. Sharol Given to evaluate either this afternoon or in the morning. Will make NPO after MN just in case.    Lisette Abu, PA-C Orthopedic Surgery [phone] 08/21/2017, 3:08 PM

## 2017-08-21 NOTE — H&P (Signed)
Date: 08/21/2017               Patient Name:  Arthur Burke MRN: [id number]  DOB: 1965/09/16 Age / Sex: 52 y.o., male   PCP: Patient, No Pcp Per         Medical Service: Internal Medicine Teaching Service         Attending Physician: Dr. Annia Belt, MD    First Contact: Dr. Frederico Hamman Pager: [phone]  Second Contact: Dr. Hetty Ely Pager: [phone]       After Hours (After 5p/  First Contact Pager: [phone]  weekends / holidays): Second Contact Pager: [phone]   Chief Complaint: Foot ulcer  History of Present Illness:  Mr. 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 He reports that he has a chronic left heel ulcer that he wears a boot to offload pressure. He reports that he strap of the boot was rubbing the dorsum of his foot and he noted some skin breakdown about over the past several weeks. He noticed worsening of this over the past several days with an ulcer developing and foul smelling discharge. He was planning to go to his podiatrist but developed fevers and chills last night. He called his renal transplant team at [facility] who advised him to come to the ED for evaluation. His renal function has been stable and did not feel there was need to transfer to [facility] for management.   In the ED, he was noted to be febrile to 100.6 with a leukocytosis. MRI was obtained which demonstrated osteomyelitis of the calcaneal tuberosity with intraosseous abscess extending into the plantar soft tissues as well as infectious tenosynovitis of the anterior tibialis tendon and extensive surround cellulitis along the anterior lower leg and ankle. He was started on vancomycin and zosyn in the ED and IMTS was asked to admit the patient for further management.    Meds:  Current Meds  Medication Sig  . amLODipine (NORVASC) 10 MG tablet Take 10 mg by mouth daily.  Marland Kitchen atorvastatin (LIPITOR) 40 MG tablet Take 40 mg by mouth every evening.  . Calcium 600-200 MG-UNIT tablet Take 1 tablet by mouth daily.  . Calcium Ascorbate 500 MG TABS Take 500 mg by mouth daily.  . carvedilol (COREG) 25 MG tablet Take 25 mg by mouth 2 (two) times daily.  . cloNIDine (CATAPRES) 0.1 MG tablet Take 0.1 mg by mouth 2 (two) times daily.  . Insulin Glargine (LANTUS SOLOSTAR) 100 UNIT/ML Solostar Pen Inject 32 Units into the skin at bedtime.  . insulin lispro (HUMALOG KWIKPEN) 100 UNIT/ML KiwkPen Inject 11-15 Units into the skin 3 (three) times daily with meals. Sliding scale  . Multiple Vitamins-Minerals (ZINC PO) Take 1 tablet by mouth every Monday, Wednesday, and Friday.  . mycophenolate (CELLCEPT) 500 MG tablet Take 500 mg by mouth 2 (two) times daily.  Marland Kitchen omeprazole (PRILOSEC) 20 MG capsule Take 20 mg by mouth 2 (two) times daily.  Marland Kitchen oxyCODONE (ROXICODONE) 15 MG immediate release tablet Take 15 mg by mouth every 4 (four) hours.  . predniSONE (DELTASONE) 5 MG tablet Take 5 mg by mouth daily.  . ramipril (ALTACE) 10 MG capsule Take 20 mg by mouth daily.  . tacrolimus (PROGRAF) 1 MG capsule Take 1  mg by mouth 2 (two) times daily.  . vitamin B-12 (CYANOCOBALAMIN) 500 MCG tablet Take 500 mcg by mouth daily.     Allergies: Allergies as of 08/21/2017 - Review Complete 08/21/2017  Allergen Reaction Noted  . Zolpidem Other (See Comments) 08/21/2017   Past Medical History:  Diagnosis Date  . Diabetes mellitus without complication ([place])   . Diabetic foot infection ([place])   . Hypertension   . MRSA (methicillin resistant staph aureus) culture positive   . Renal disorder   . Vision impairment     Family History:  CAD - father CVA - father DM - maternal GM CAD - maternal GM HTN - mother  Social History:  Social History   Socioeconomic History  . Marital status:  Divorced    Spouse name: None  . Number of children: None  . Years of education: None  . Highest education level: None  Social Needs  . Financial resource strain: None  . Food insecurity - worry: None  . Food insecurity - inability: None  . Transportation needs - medical: None  . Transportation needs - non-medical: None  Occupational History  . None  Tobacco Use  . Smoking status: Never Smoker  . Smokeless tobacco: Never Used  Substance and Sexual Activity  . Alcohol use: No    Frequency: Never  . Drug use: None  . Sexual activity: None  Other Topics Concern  . None  Social History Narrative  . None   Review of Systems: A complete ROS was negative except as per HPI.   Physical Exam: Blood pressure 118/64, pulse 76, temperature (!) 100.4 F (38 C), temperature source Oral, resp. rate 18, height 5\' 11"  (1.803 m), weight 178 lb (80.7 kg), SpO2 93 %.  General: alert, well-developed, and cooperative to examination.  Head: normocephalic and atraumatic.  Eyes: vision grossly intact, pupils equal, pupils round, pupils reactive to light, no injection and anicteric.  Mouth: pharynx pink and moist, no erythema, and no exudates. Neck: supple, full ROM, no thyromegaly, no JVD, and no carotid bruits. Lungs: normal respiratory effort, no accessory muscle use, normal breath sounds, no crackles, and no wheezes. Heart: tachycardic, regular rhythm, no murmur, no gallop, and no rub.  Abdomen: soft, non-tender, normal bowel sounds, no distention, no guarding Msk: no joint swelling, no joint warmth, and no redness over joints.  Pulses: 2+ DP/PT pulses bilaterally Extremities: No cyanosis, clubbing, edema Neurologic: alert & oriented X3, no focal deficits Psych: normal mood and affect Skin: S/P amputation of left great and second toes. Heel wound clean without signs of infection. Ucler on dorsum of left foot 2 cm x 2 cm with yellow purulent drainage and foul odor. Surrounding erythema and  warmth extending up anterior shin. Picture below.        EKG: personally reviewed my interpretation is NSR  CXR: personally reviewed my interpretation is no acute abnormalities  Assessment & Plan by Problem:  Osteomyelitis of left calcaneal tuberosity and intraosseous abscess with surrounding cellulitis: MRI obtained in the ED c/w osteomyelitis. Started on vanc/zosyn in the ED. He does have a history of MRSA osteomyelitis in this foot with toe amputations. Given his history of renal transplant will narrow to vanc and ceftriaxone for now. Clinically appears stable and non-toxic. Did have a fever on presentation with a leukocytosis of 18k (though on chronic low dose prednisone). Ortho consulted and suspect he will require amputation. Obtaining ABIs per ortho request to assess vascularity. Albumin 2.7 on presentation. Will consult nutrition  to attmept to maximize his nutrition status in anticipation of amputation.   S/P renal transplant with immunosuppression: Followed at [facility]. On tacrolimus, cellcept and prednisone 5 mg daily. Cr function 1.0-1.1 baseline. Cr mildly elevated at 1.26 on presentation today. Will continue home medications and monitor renal function.   DM type I: Reports most recent A1c was 7.8. Reports taking Lantus 32 units qhs and sliding scale TID with meals. CBGs here initially in the 300s. Will order SSI-m with Lantus 28 units qhs and monitor. Will need good CBG control in anticipation of amputation.  HTN: Continue home amlodipine, coreg, clonidine, ramipril   Dispo: Admit patient to Inpatient with expected length of stay greater than 2 midnights.  Signed: Maryellen Pile, MD 08/21/2017, 2:03 PM  Pager: [phone]

## 2017-08-21 NOTE — ED Notes (Signed)
Care handoff to [facility]

## 2017-08-21 NOTE — ED Triage Notes (Signed)
Pt presents from home with PTAR for fever, chills, n/v since 0100 this morning; pt states he is a kidney transplant patient on rejection meds since 2005 at [facility]; denies being around other with similar symptoms

## 2017-08-22 ENCOUNTER — Other Ambulatory Visit: Payer: Self-pay

## 2017-08-22 ENCOUNTER — Inpatient Hospital Stay (HOSPITAL_COMMUNITY): Payer: Medicare Other

## 2017-08-22 DIAGNOSIS — L089 Local infection of the skin and subcutaneous tissue, unspecified: Secondary | ICD-10-CM | POA: Diagnosis present

## 2017-08-22 DIAGNOSIS — L97323 Non-pressure chronic ulcer of left ankle with necrosis of muscle: Secondary | ICD-10-CM

## 2017-08-22 DIAGNOSIS — N186 End stage renal disease: Secondary | ICD-10-CM

## 2017-08-22 DIAGNOSIS — M869 Osteomyelitis, unspecified: Secondary | ICD-10-CM

## 2017-08-22 DIAGNOSIS — Z8614 Personal history of Methicillin resistant Staphylococcus aureus infection: Secondary | ICD-10-CM

## 2017-08-22 DIAGNOSIS — E1051 Type 1 diabetes mellitus with diabetic peripheral angiopathy without gangrene: Secondary | ICD-10-CM | POA: Diagnosis present

## 2017-08-22 DIAGNOSIS — Z940 Kidney transplant status: Secondary | ICD-10-CM

## 2017-08-22 DIAGNOSIS — R1030 Lower abdominal pain, unspecified: Secondary | ICD-10-CM

## 2017-08-22 DIAGNOSIS — E1022 Type 1 diabetes mellitus with diabetic chronic kidney disease: Secondary | ICD-10-CM

## 2017-08-22 DIAGNOSIS — Z8249 Family history of ischemic heart disease and other diseases of the circulatory system: Secondary | ICD-10-CM

## 2017-08-22 DIAGNOSIS — E1042 Type 1 diabetes mellitus with diabetic polyneuropathy: Secondary | ICD-10-CM

## 2017-08-22 DIAGNOSIS — R1032 Left lower quadrant pain: Secondary | ICD-10-CM

## 2017-08-22 DIAGNOSIS — D649 Anemia, unspecified: Secondary | ICD-10-CM | POA: Diagnosis present

## 2017-08-22 DIAGNOSIS — I70209 Unspecified atherosclerosis of native arteries of extremities, unspecified extremity: Secondary | ICD-10-CM

## 2017-08-22 DIAGNOSIS — M86172 Other acute osteomyelitis, left ankle and foot: Secondary | ICD-10-CM | POA: Diagnosis present

## 2017-08-22 DIAGNOSIS — L98492 Non-pressure chronic ulcer of skin of other sites with fat layer exposed: Secondary | ICD-10-CM

## 2017-08-22 DIAGNOSIS — Z89422 Acquired absence of other left toe(s): Secondary | ICD-10-CM

## 2017-08-22 DIAGNOSIS — Z833 Family history of diabetes mellitus: Secondary | ICD-10-CM

## 2017-08-22 DIAGNOSIS — L03116 Cellulitis of left lower limb: Secondary | ICD-10-CM

## 2017-08-22 DIAGNOSIS — I120 Hypertensive chronic kidney disease with stage 5 chronic kidney disease or end stage renal disease: Secondary | ICD-10-CM

## 2017-08-22 DIAGNOSIS — E1069 Type 1 diabetes mellitus with other specified complication: Principal | ICD-10-CM

## 2017-08-22 DIAGNOSIS — E10319 Type 1 diabetes mellitus with unspecified diabetic retinopathy without macular edema: Secondary | ICD-10-CM

## 2017-08-22 DIAGNOSIS — E1040 Type 1 diabetes mellitus with diabetic neuropathy, unspecified: Secondary | ICD-10-CM | POA: Diagnosis present

## 2017-08-22 DIAGNOSIS — Z7952 Long term (current) use of systemic steroids: Secondary | ICD-10-CM

## 2017-08-22 DIAGNOSIS — L97429 Non-pressure chronic ulcer of left heel and midfoot with unspecified severity: Secondary | ICD-10-CM

## 2017-08-22 LAB — BLOOD CULTURE ID PANEL (REFLEXED)
Acinetobacter baumannii: NOT DETECTED
CANDIDA KRUSEI: NOT DETECTED
Candida albicans: NOT DETECTED
Candida glabrata: NOT DETECTED
Candida parapsilosis: NOT DETECTED
Candida tropicalis: NOT DETECTED
ESCHERICHIA COLI: NOT DETECTED
Enterobacter cloacae complex: NOT DETECTED
Enterobacteriaceae species: NOT DETECTED
Enterococcus species: NOT DETECTED
HAEMOPHILUS INFLUENZAE: NOT DETECTED
Klebsiella oxytoca: NOT DETECTED
Klebsiella pneumoniae: NOT DETECTED
Listeria monocytogenes: NOT DETECTED
NEISSERIA MENINGITIDIS: NOT DETECTED
PROTEUS SPECIES: NOT DETECTED
PSEUDOMONAS AERUGINOSA: NOT DETECTED
SERRATIA MARCESCENS: NOT DETECTED
STAPHYLOCOCCUS AUREUS BCID: NOT DETECTED
STAPHYLOCOCCUS SPECIES: NOT DETECTED
STREPTOCOCCUS PNEUMONIAE: NOT DETECTED
Streptococcus agalactiae: NOT DETECTED
Streptococcus pyogenes: NOT DETECTED
Streptococcus species: DETECTED — AB

## 2017-08-22 LAB — GLUCOSE, CAPILLARY
GLUCOSE-CAPILLARY: 79 mg/dL (ref 65–99)
Glucose-Capillary: 100 mg/dL — ABNORMAL HIGH (ref 65–99)
Glucose-Capillary: 108 mg/dL — ABNORMAL HIGH (ref 65–99)
Glucose-Capillary: 340 mg/dL — ABNORMAL HIGH (ref 65–99)
Glucose-Capillary: 335 mg/dL — ABNORMAL HIGH (ref 65–99)

## 2017-08-22 LAB — BASIC METABOLIC PANEL
Anion gap: 11 (ref 5–15)
BUN: 23 mg/dL — AB (ref 6–20)
CO2: 23 mmol/L (ref 22–32)
Calcium: 8.5 mg/dL — ABNORMAL LOW (ref 8.9–10.3)
Chloride: 98 mmol/L — ABNORMAL LOW (ref 101–111)
Creatinine, Ser: 1.29 mg/dL — ABNORMAL HIGH (ref 0.61–1.24)
GFR calc Af Amer: >60 mL/min (ref >60)
GFR calc non Af Amer: >60 mL/min (ref >60)
Glucose, Bld: 92 mg/dL (ref 65–99)
POTASSIUM: 3.8 mmol/L (ref 3.5–5.1)
SODIUM: 132 mmol/L — AB (ref 135–145)

## 2017-08-22 LAB — CBC
HCT: 34.6 % — ABNORMAL LOW (ref 39.0–52.0)
HEMOGLOBIN: 11.1 g/dL — AB (ref 13.0–17.0)
MCH: 28.1 pg (ref 26.0–34.0)
MCHC: 32.1 g/dL (ref 30.0–36.0)
MCV: 87.6 fL (ref 78.0–100.0)
Platelets: 340 10*3/uL (ref 150–400)
RBC: 3.95 MIL/uL — AB (ref 4.22–5.81)
RDW: 13.7 % (ref 11.5–15.5)
WBC: 22 10*3/uL — ABNORMAL HIGH (ref 4.0–10.5)

## 2017-08-22 LAB — HIV ANTIBODY (ROUTINE TESTING W REFLEX): HIV Screen 4th Generation wRfx: NONREACTIVE

## 2017-08-22 LAB — SURGICAL PCR SCREEN
MRSA, PCR: NEGATIVE
STAPHYLOCOCCUS AUREUS: POSITIVE — AB

## 2017-08-22 MED ORDER — INSULIN GLARGINE 100 UNIT/ML ~~LOC~~ SOLN
25.0000 [IU] | Freq: Every day | SUBCUTANEOUS | Status: DC
  Administered 2017-08-22: 25 [IU] via SUBCUTANEOUS
  Filled 2017-08-22 (×2): qty 0.25

## 2017-08-22 MED ORDER — GLUCERNA SHAKE PO LIQD
237.0000 mL | Freq: Two times a day (BID) | ORAL | Status: DC
  Administered 2017-08-23 – 2017-08-24 (×3): 237 mL via ORAL
  Filled 2017-08-22 (×11): qty 237

## 2017-08-22 MED ORDER — CHLORHEXIDINE GLUCONATE 4 % EX LIQD
60.0000 mL | Freq: Once | CUTANEOUS | Status: DC
  Filled 2017-08-22: qty 60

## 2017-08-22 MED ORDER — CHLORHEXIDINE GLUCONATE CLOTH 2 % EX PADS
6.0000 | MEDICATED_PAD | Freq: Every day | CUTANEOUS | Status: DC
  Administered 2017-08-22: 6 via TOPICAL

## 2017-08-22 MED ORDER — POVIDONE-IODINE 10 % EX SWAB: 2.0000 "application " | Freq: Once | CUTANEOUS | Status: DC

## 2017-08-22 MED ORDER — JUVEN PO PACK
1.0000 | PACK | Freq: Two times a day (BID) | ORAL | Status: DC
  Administered 2017-08-22 – 2017-08-26 (×8): 1 via ORAL
  Filled 2017-08-22 (×9): qty 1

## 2017-08-22 MED ORDER — MUPIROCIN 2 % EX OINT
1.0000 "application " | TOPICAL_OINTMENT | Freq: Two times a day (BID) | CUTANEOUS | Status: DC
  Administered 2017-08-22 – 2017-08-26 (×9): 1 via NASAL
  Filled 2017-08-22 (×2): qty 22

## 2017-08-22 MED ORDER — CEFAZOLIN SODIUM-DEXTROSE 2-4 GM/100ML-% IV SOLN
2.0000 g | INTRAVENOUS | Status: AC
  Administered 2017-08-23: 2 g via INTRAVENOUS
  Filled 2017-08-22 (×2): qty 100

## 2017-08-22 MED ORDER — DEXTROSE 5 % IV SOLN
2.0000 g | Freq: Three times a day (TID) | INTRAVENOUS | Status: DC
  Administered 2017-08-22 – 2017-08-23 (×4): 2 g via INTRAVENOUS
  Filled 2017-08-22 (×5): qty 2

## 2017-08-22 NOTE — Progress Notes (Signed)
ABI's have been completed. Right 1.47 Left 1.47  08/22/17 8:45 AM Carlos Levering RVT

## 2017-08-22 NOTE — Evaluation (Signed)
Physical Therapy Evaluation Patient Details Name: Arthur Burke MRN: [id number] DOB: 11/25/1965 Today's Date: 08/22/2017   History of Present Illness  52 y.o. male admitted on 08/21/17 for Infected L heel ulcer.  Dx with osteomyelitis of left calcaneal tuberosity and intraosseous abscess with surrounding cellulitis.  He is scheduled to undergo a L BKA on 08/23/17.  Pt with other significant PMH of DM1, kidney transplant, diabetic retinopathy and neuropathy, MRSA, HTN, and multiple left foot amputations.    Clinical Impression  Pt due to have L LE BKA tomorrow.  Therapist started education on post op therapy and BKA eduction.  He is not sure (and we will not be sure until we see him post op) if he wants to pursue CIR vs HHPT.  He has the support of his mother at home.   PT to follow acutely for deficits listed below.     Follow Up Recommendations CIR    Equipment Recommendations  Wheelchair (measurements PT);Wheelchair cushion (measurements PT);Other (comment)(16x16 with amputee leg attachment L)    Recommendations for Other Services Rehab consult     Precautions / Restrictions Precautions Precautions: Fall Restrictions Weight Bearing Restrictions: No      Mobility  Bed Mobility Overal bed mobility: Modified Independent                Transfers Overall transfer level: Needs assistance   Transfers: Sit to/from Stand Sit to Stand: Supervision         General transfer comment: supervision for safety      Balance Overall balance assessment: Needs assistance Sitting-balance support: Feet supported;No upper extremity supported Sitting balance-Leahy Scale: Good     Standing balance support: Single extremity supported Standing balance-Leahy Scale: Fair                               Pertinent Vitals/Pain Pain Assessment: Faces Faces Pain Scale: Hurts whole lot Pain Location: left foot/lower leg as well as chronic low back pain Pain Descriptors /  Indicators: Grimacing;Guarding Pain Intervention(s): Limited activity within patient's tolerance;Monitored during session;Repositioned    Home Living Family/patient expects to be discharged to:: Private residence Living Arrangements: Parent(mother) Available Help at Discharge: Family;Available 24 hours/day Type of Home: House Home Access: Level entry     Home Layout: One level Home Equipment: Walker - 2 wheels;Walker - 4 wheels;Cane - single point;Shower seat      Prior Function Level of Independence: Independent with assistive device(s)         Comments: cane vs RW     Hand Dominance   Dominant Hand: Right    Extremity/Trunk Assessment   Upper Extremity Assessment Upper Extremity Assessment: Defer to OT evaluation    Lower Extremity Assessment Lower Extremity Assessment: LLE deficits/detail LLE Deficits / Details: left foot with h/o multiple surgeries (toe amputations, heel surgery), and non healing ulcers.  He is very sensative to touch in his lower legs bil due to peripherial neuropathy.  Knee and hip ROM are WNL and strength is at least 3/5 per gross functional assessment.   LLE Sensation: history of peripheral neuropathy    Cervical / Trunk Assessment Cervical / Trunk Assessment: Other exceptions Cervical / Trunk Exceptions: chronic low back pain reported  Communication   Communication: No difficulties  Cognition Arousal/Alertness: Awake/alert Behavior During Therapy: WFL for tasks assessed/performed Overall Cognitive Status: Within Functional Limits for tasks assessed  General Comments General comments (skin integrity, edema, etc.): Educated pt on post-op therapy including visit on POD #1, phantom pain and self massage, no pillow balled up under left knee to avoid contractures.          Assessment/Plan    PT Assessment Patient needs continued PT services  PT Problem List Decreased  strength;Decreased activity tolerance;Decreased mobility;Decreased balance;Decreased knowledge of use of DME;Decreased knowledge of precautions;Impaired sensation;Pain;Decreased skin integrity       PT Treatment Interventions DME instruction;Gait training;Functional mobility training;Therapeutic activities;Therapeutic exercise;Balance training;Patient/family education;Wheelchair mobility training    PT Goals (Current goals can be found in the Care Plan section)  Acute Rehab PT Goals Patient Stated Goal: to get a prosthetic leg PT Goal Formulation: With patient Time For Goal Achievement: 08/29/17 Potential to Achieve Goals: Good    Frequency Min 3X/week           AM-PAC PT "6 Clicks" Daily Activity  Outcome Measure Difficulty turning over in bed (including adjusting bedclothes, sheets and blankets)?: None Difficulty moving from lying on back to sitting on the side of the bed? : None Difficulty sitting down on and standing up from a chair with arms (e.g., wheelchair, bedside commode, etc,.)?: None Help needed moving to and from a bed to chair (including a wheelchair)?: None Help needed walking in hospital room?: A Little Help needed climbing 3-5 steps with a railing? : A Little 6 Click Score: 22    End of Session   Activity Tolerance: Patient limited by pain Patient left: in chair;with call bell/phone within reach Nurse Communication: Mobility status PT Visit Diagnosis: Difficulty in walking, not elsewhere classified (R26.2);Pain;Muscle weakness (generalized) (M62.81) Pain - Right/Left: Left Pain - part of body: Leg    Time: 0630-1601 PT Time Calculation (min) (ACUTE ONLY): 25 min   Charges:           Wells Guiles B. Cassell Voorhies, PT, DPT [phone]   PT Evaluation $PT Eval Moderate Complexity: 1 Mod PT Treatments $Therapeutic Activity: 8-22 mins   08/22/2017, 5:36 PM

## 2017-08-22 NOTE — Progress Notes (Addendum)
PHARMACY - PHYSICIAN COMMUNICATION CRITICAL VALUE ALERT - BLOOD CULTURE IDENTIFICATION (BCID)  Arthur Burke is an 52 y.o. male who presented to [facility] on 08/21/2017 with chronic osteomyelitis - abscess and ulcer over tibial tendon. Planning for BKA amputation on Saturday. Blood cx with 1/4 bottles with strep species.  Name of physician (or Provider) Contacted: Sent page to IMTS  Current antibiotics: Vancomycin + cefepime  Changes to prescribed antibiotics recommended:  Patient is on recommended antibiotics - No changes needed  Results for orders placed or performed during the hospital encounter of 08/21/17  Blood Culture ID Panel (Reflexed) (Collected: 08/21/2017  5:08 AM)  Result Value Ref Range   Enterococcus species NOT DETECTED NOT DETECTED   Listeria monocytogenes NOT DETECTED NOT DETECTED   Staphylococcus species NOT DETECTED NOT DETECTED   Staphylococcus aureus NOT DETECTED NOT DETECTED   Streptococcus species DETECTED (A) NOT DETECTED   Streptococcus agalactiae NOT DETECTED NOT DETECTED   Streptococcus pneumoniae NOT DETECTED NOT DETECTED   Streptococcus pyogenes NOT DETECTED NOT DETECTED   Acinetobacter baumannii NOT DETECTED NOT DETECTED   Enterobacteriaceae species NOT DETECTED NOT DETECTED   Enterobacter cloacae complex NOT DETECTED NOT DETECTED   Escherichia coli NOT DETECTED NOT DETECTED   Klebsiella oxytoca NOT DETECTED NOT DETECTED   Klebsiella pneumoniae NOT DETECTED NOT DETECTED   Proteus species NOT DETECTED NOT DETECTED   Serratia marcescens NOT DETECTED NOT DETECTED   Haemophilus influenzae NOT DETECTED NOT DETECTED   Neisseria meningitidis NOT DETECTED NOT DETECTED   Pseudomonas aeruginosa NOT DETECTED NOT DETECTED   Candida albicans NOT DETECTED NOT DETECTED   Candida glabrata NOT DETECTED NOT DETECTED   Candida krusei NOT DETECTED NOT DETECTED   Candida parapsilosis NOT DETECTED NOT DETECTED   Candida tropicalis NOT DETECTED NOT DETECTED    [name] 08/22/2017  11:26 PM

## 2017-08-22 NOTE — Progress Notes (Signed)
   Subjective:  Febrile overnight 100.9, otherwise hemodynamically stable. No acute events overnight.  Continues to have pain in his lower extremities. No other complaints this morning. Discussed need for likely amputation   Objective:  Vital signs in last 24 hours: Vitals:   08/21/17 1750 08/21/17 1925 08/21/17 2024 08/22/17 0433  BP: (!) 158/66  132/64 138/60  Pulse: 85  91 72  Resp: 18  19 18   Temp: 99.1 F (37.3 C)  (!) 100.9 F (38.3 C) 98.7 F (37.1 C)  TempSrc: Oral  Oral Oral  SpO2: 97%  95% 97%  Weight:   177 lb 11.1 oz (80.6 kg)   Height:  5\' 11"  (1.803 m)     General: very pleasant male, well-nourished, well-developed, in no acute distress  HENT: NCAT, neck supple and FROM, OP clear without exudates or erythema Eyes: anicteric sclera, R pupil greater in size than L pupil  Cardiac: regular rate and rhythm, nl S1/S2, no murmurs, rubs or gallops  Pulm: CTAB, no wheezes or crackles, no increased work of breathing  Abd: soft, NTND, bowel sounds present Neuro: A&Ox3, no focal deficits noted  Ext: LLE warm to the touch with trace edema and overlying erythema. LLE ulcer bandaged.   Assessment/Plan:  # Osteomyelitis of left calcaneal tuberosity and intraosseous abscess with surrounding cellulitis: Febrile overnight and leukocytosis worsening, but appears stable clinically. Currently on vancomycin and Rocephin. Will switch to cefepime for pseudomonal coverage and monitor renal function given AKI on admission. Per Dr. Jess Barters note, OR tomorrow morning for transtibial amputation.  - Continue vancomycin  - Switch Rocephin to cefepime  - NPO at [facility] for OR tomorrow morning   # S/P renal and pancreatic transplant with immunosuppression: Followed at [facility]. On tacrolimus, cellcept and prednisone 5 mg daily. Cr function 1.0-1.1 baseline. Cr continues to be mildly elevated at 1.26. Will continue home medications and monitor renal function.  - Continue Cellcept 500 mg BID  - Continue  Prograf 1 mg BID  - Continue prednisone 5 mg QD   # DM type I: Reports most recent A1c was 7.8. A1c here 8.6. On Lantus 32 units qhs and sliding scale TID with meals at home. CBGs here initially in the 300s. Will need good CBG control in anticipation of amputation. - Lantus 28 qHS.  - SSI-M with CBG monitoring   # HTN: - Continue home amlodipine 10 mg Qd - Continue Coreg 25 mg BI - Continue clonidine 0.1mg  BID - Continue ramipril 20 mg QD    Dispo: Anticipated discharge in approximately 4-5 day(s) pending LLE amputation due to osteomyelitis.   Welford Roche, MD 08/22/2017, 6:36 AM Pager: [phone]

## 2017-08-22 NOTE — Care Management Note (Signed)
Case Management Note  Patient Details  Name: TRISTEN LUCE MRN: [id number] Date of Birth: 01-09-1966  Subjective/Objective:                 NEDIM OKI is a 52 y.o. male who presents with a left heel ulcer and necrotic ulcer over the anterior tibial tendon on the left. Surgery planned for 1/12 am for BKA. CM will continue to follow.      Action/Plan:   Expected Discharge Date:                  Expected Discharge Plan:  [place]  In-House Referral:     Discharge planning Services  CM Consult  Post Acute Care Choice:    Choice offered to:     DME Arranged:    DME Agency:     HH Arranged:    [place] Agency:     Status of Service:  In process, will continue to follow  If discussed at Long Length of Stay Meetings, dates discussed:    Additional Comments:  Carles Collet, RN 08/22/2017, 2:20 PM

## 2017-08-22 NOTE — Consult Note (Signed)
ORTHOPAEDIC CONSULTATION  REQUESTING PHYSICIAN: Annia Belt, MD  Chief Complaint: Chronic osteomyelitis left calcaneus with ischemic ulcer and necrotic anterior tibial tendon.  HPI: Arthur Burke is a 52 y.o. male who presents with a left heel ulcer and necrotic ulcer over the anterior tibial tendon on the left.  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  Patient presents at this time complaining of increasing necrotic ulcer anteriorly of the ankle with ulceration and drainage from the left heel.  Past Medical History:  Diagnosis Date  . Diabetes mellitus without complication ([place])   . Diabetic foot infection ([place])   . Hypertension   . MRSA (methicillin resistant staph aureus) culture positive   . Renal disorder   . Vision impairment    Past Surgical History:  Procedure Laterality Date  . KIDNEY TRANSPLANT  2006   [facility]  . SKIN GRAFT     L diabetic foot wound with debriedment and skin grafting  . TOE AMPUTATION     R great and 2nd toe   Social History   Socioeconomic History  . Marital status: Divorced    Spouse name: None  . Number of children: None  . Years of education: None  . Highest education level: None  Social Needs  . Financial resource strain: None  . Food insecurity - worry: None  . Food insecurity - inability: None  . Transportation needs - medical: None  . Transportation needs - non-medical: None  Occupational History  . None  Tobacco Use  . Smoking status: Never Smoker  . Smokeless tobacco: Never Used  Substance and Sexual Activity  . Alcohol use: No    Frequency: Never  . Drug use: None  . Sexual activity: None  Other Topics Concern  . None  Social History Narrative  . None   History reviewed. No pertinent family history. - negative except otherwise stated in the family history section Allergies  Allergen Reactions  . Zolpidem Other (See Comments)    Other Reaction: CNS  Disorder   Prior to Admission medications   Medication Sig Start Date End Date Taking? Authorizing Provider  amLODipine (NORVASC) 10 MG tablet Take 10 mg by mouth daily. 04/29/17  Yes [provider]  atorvastatin (LIPITOR) 40 MG tablet Take 40 mg by mouth every evening. 11/28/16  Yes [provider]  Calcium 600-200 MG-UNIT tablet Take 1 tablet by mouth daily.   Yes [provider]  Calcium Ascorbate 500 MG TABS Take 500 mg by mouth daily.   Yes [provider]  carvedilol (COREG) 25 MG tablet Take 25 mg by mouth 2 (two) times daily. 05/12/17  Yes [provider]  cloNIDine (CATAPRES) 0.1 MG tablet Take 0.1 mg by mouth 2 (two) times daily. 07/25/17  Yes [provider]  Insulin Glargine (LANTUS SOLOSTAR) 100 UNIT/ML Solostar Pen Inject 32 Units into the skin at bedtime. 10/09/16  Yes [provider]  insulin lispro (HUMALOG KWIKPEN) 100 UNIT/ML KiwkPen Inject 11-15 Units into the skin 3 (three) times daily with meals. Sliding scale 07/29/17  Yes [provider]  Multiple Vitamins-Minerals (ZINC PO) Take 1 tablet by mouth every Monday, Wednesday, and Friday.   Yes [provider]  mycophenolate (CELLCEPT) 500 MG tablet Take 500 mg by mouth 2 (two) times daily. 06/03/14  Yes [provider]  omeprazole (PRILOSEC) 20 MG capsule Take 20 mg by mouth 2 (two) times daily. 11/14/16 11/14/17 Yes [provider]  oxyCODONE (ROXICODONE) 15 MG immediate release tablet Take 15 mg by mouth every 4 (four) hours.   Yes [provider]  predniSONE (DELTASONE) 5 MG tablet Take 5 mg by mouth daily. 06/24/17  Yes [provider]  ramipril (ALTACE) 10 MG capsule Take 20 mg by mouth daily. 07/25/17  Yes [provider]  tacrolimus (PROGRAF) 1 MG capsule Take 1 mg by mouth 2 (two) times daily. 06/27/17  Yes [provider]  vitamin B-12 (CYANOCOBALAMIN) 500 MCG tablet Take 500 mcg by mouth  daily.   Yes [provider]   Mr Foot Left W Wo Contrast  Result Date: 08/21/2017 CLINICAL DATA:  Left anterior ankle and heel ulcers with fever and chills. Concern for osteomyelitis. History of diabetes and renal transplant. EXAM: MRI OF THE LEFT FOREFOOT WITHOUT AND WITH CONTRAST; MRI OF THE LEFT ANKLE WITHOUT AND WITH CONTRAST TECHNIQUE: Multiplanar, multisequence MR imaging of the left ankle and foot was performed both before and after administration of intravenous contrast. CONTRAST:  59mL MULTIHANCE GADOBENATE DIMEGLUMINE 529 MG/ML IV SOLN COMPARISON:  None. FINDINGS: Bones/Joint/Cartilage There is a 2.0 x 1.2 x 1.7 cm (AP by transverse by CC) rim enhancing fluid collection within the calcaneal tuberosity, consistent with abscess, this extends into the plantar soft tissues of the heel, where it measures 4.9 x 4.6 x 2.2 cm, with a small drainage tract to the skin (series 20, image 10). No fracture or dislocation. Prior amputations of the first and second toes. Mild degenerative marrow edema in the midfoot. No joint effusion. Ligaments Ankle ligaments are intact. Lisfranc ligament is intact. Toe collateral ligaments are intact. Muscles and Tendons Soft tissue ulceration over the anterior ankle, with exposed anterior tibialis tendon. There is rim enhancement surrounding the tendon, consistent with tenosynovitis. Remaining flexor, extensor, and peroneal tendons are unremarkable. Moderate Achilles tendinosis. Complete fatty atrophy of the intrinsic muscles of the foot. Soft tissue Plantar heel abscess as described above. Diffuse soft tissue swelling and enhancement along the anterior ankle. IMPRESSION: 1. 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 2. Soft tissue ulceration over the anterior ankle with exposed anterior tibialis tendon and resultant infectious tenosynovitis. Extensive surrounding cellulitis along the  anterior lower leg and ankle. 3. No osteomyelitis or soft tissue abscess within the forefoot. Electronically Signed   By: Titus Dubin M.D.   On: 08/21/2017 09:40   Mr Ankle Left W Wo Contrast  Result Date: 08/21/2017 CLINICAL DATA:  Left anterior ankle and heel ulcers with fever and chills. Concern for osteomyelitis. History of diabetes and renal transplant. EXAM: MRI OF THE LEFT FOREFOOT WITHOUT AND WITH CONTRAST; MRI OF THE LEFT ANKLE WITHOUT AND WITH CONTRAST TECHNIQUE: Multiplanar, multisequence MR imaging of the left ankle and foot was performed both before and after administration of intravenous contrast. CONTRAST:  53mL MULTIHANCE GADOBENATE DIMEGLUMINE 529 MG/ML IV SOLN COMPARISON:  None. FINDINGS: Bones/Joint/Cartilage There is a 2.0 x 1.2 x 1.7 cm (AP by transverse by CC) rim enhancing fluid collection within the calcaneal tuberosity, consistent with abscess, this extends into the plantar soft tissues of the heel, where it measures 4.9 x 4.6 x 2.2 cm, with a small drainage tract to the skin (series 20, image 10). No fracture or dislocation. Prior amputations of the first and second toes. Mild degenerative marrow edema in the midfoot. No joint effusion. Ligaments Ankle ligaments are intact. Lisfranc ligament is intact. Toe collateral ligaments are intact. Muscles  and Tendons Soft tissue ulceration over the anterior ankle, with exposed anterior tibialis tendon. There is rim enhancement surrounding the tendon, consistent with tenosynovitis. Remaining flexor, extensor, and peroneal tendons are unremarkable. Moderate Achilles tendinosis. Complete fatty atrophy of the intrinsic muscles of the foot. Soft tissue Plantar heel abscess as described above. Diffuse soft tissue swelling and enhancement along the anterior ankle. IMPRESSION: 1. 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 2. Soft tissue ulceration over the anterior  ankle with exposed anterior tibialis tendon and resultant infectious tenosynovitis. Extensive surrounding cellulitis along the anterior lower leg and ankle. 3. No osteomyelitis or soft tissue abscess within the forefoot. Electronically Signed   By: Titus Dubin M.D.   On: 08/21/2017 09:40   Dg Chest Port 1 View  Result Date: 08/21/2017 CLINICAL DATA:  Fever EXAM: PORTABLE CHEST 1 VIEW COMPARISON:  Chest radiograph 04/20/2013 FINDINGS: The heart size and mediastinal contours are within normal limits. There is mild bibasilar atelectasis. Both lungs are otherwise clear. The visualized skeletal structures are unremarkable. IMPRESSION: Mild bibasilar atelectasis. Electronically Signed   By: Ulyses Jarred M.D.   On: 08/21/2017 04:50   - pertinent xrays, CT, MRI studies were reviewed and independently interpreted  Positive ROS: All other systems have been reviewed and were otherwise negative with the exception of those mentioned in the HPI and as above.  Physical Exam: General: Alert, no acute distress Psychiatric: Patient is competent for consent with normal mood and affect Lymphatic: No axillary or cervical lymphadenopathy Cardiovascular: No pedal edema Respiratory: No cyanosis, no use of accessory musculature GI: No organomegaly, abdomen is soft and non-tender  Skin:  Patient has cellulitis dorsally over the wound on the ankle.  There is no cellulitis over the heel.  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   MUSCULOSKELETAL:  Examination patient does not have a palpable dorsalis pedis pulse he does have brawny skin color changes but very minimal swelling in the left leg.  He has an ulcer over the calcaneus he has a 3 cm necrotic ulcer over the anterior tibial tendon with necrotic tendon.  Review of the MRI scan shows tendon involvement as well as chronic osteomyelitis involving  the remainder of the calcaneus.  Assessment: With abscess and necrotic ulcer over the anterior tibial tendon on the left with chronic osteomyelitis of the left calcaneus plan: Discussed with patient as best option would be to proceed with a transtibial amputation.  Plan: Patient states he understands wished to proceed with surgery.  He did eat some mashed potatoes and drank Sprite earlier this morning.  Plan for surgery Saturday morning first case.  Risk and benefits were discussed patient understands and wished to proceed at this time.  Thank you for the consult and the opportunity to see Mr. Arthur Burke, [place] [phone] 10:50 AM

## 2017-08-22 NOTE — Progress Notes (Signed)
Initial Nutrition Assessment  DOCUMENTATION CODES:   Not applicable  INTERVENTION:   Provide Juven packet BID, provides arginine and glutamine for additional wound healing  Provide Glucerna Shake po BID, each supplement provides 220 kcal and 10 grams of protein  NUTRITION DIAGNOSIS:   Increased nutrient needs related to wound healing as evidenced by estimated needs.  GOAL:   Patient will meet greater than or equal to 90% of their needs  MONITOR:   PO intake, Supplement acceptance, Weight trends, I & O's, Skin  REASON FOR ASSESSMENT:   Consult Assessment of nutrition requirement/status, Wound healing  ASSESSMENT:   Pt with PMH of type I DM, HTN, s/p renal transplant (2006), multiple toe amputations, and vision impairment presents with osteomyelitis.    Per Orthopedics note, BKA surgery scheduled for tomorrow morning 01/12  Pt reports he is on Lantus and sliding scale at home, checking his blood sugars 4-6 times/day. Pt reports he consumes 2-3 meals per day and often a bedtime snack depending on his blood sugar levels.  Pt with intentional weight loss prior to admission, making lifestyle changes and choosing healthier snacks. However his appetite and activity level decreased slightly PTA r/t foot pain. No recent weight history available per chart. Pt reports no nutrition impact symptoms and that he is hungry at visit.   Pt amenable to supplementation. RD discussed optimizing his nutrition while admitted and also once discharged.   Labs reviewed; CBG 79-108, Na 132, Chloride 98, BUN 23, Albumin 2.7, Hemoglobin A1C 8.6 Medications reviewed; sliding scale insulin, Lantus, Protonix, Colace, Prednisone, vitamin B12  NUTRITION - FOCUSED PHYSICAL EXAM:    Most Recent Value  Orbital Region  No depletion  Upper Arm Region  No depletion  Thoracic and Lumbar Region  No depletion  Buccal Region  No depletion  Temple Region  No depletion  Clavicle Bone Region  Mild depletion   Clavicle and Acromion Bone Region  Mild depletion  Scapular Bone Region  No depletion  Dorsal Hand  No depletion  Patellar Region  Mild depletion  Anterior Thigh Region  Mild depletion  Posterior Calf Region  Mild depletion  Edema (RD Assessment)  Moderate      Diet Order:  Diet Carb Modified Fluid consistency: Thin; Room service appropriate? Yes Diet NPO time specified  EDUCATION NEEDS:   Education needs have been addressed  Skin:  Skin Assessment: Skin Integrity Issues: Skin Integrity Issues:: Diabetic Ulcer Diabetic Ulcer: L foot and lower L leg  Last BM:  08/20/17  Height:   Ht Readings from Last 1 Encounters:  08/21/17 5\' 11"  (1.803 m)   Weight:   Wt Readings from Last 1 Encounters:  08/21/17 177 lb 11.1 oz (80.6 kg)   Ideal Body Weight:  78.2 kg  BMI:  Body mass index is 24.78 kg/m.  Estimated Nutritional Needs:   Kcal:  2000-2200  Protein:  120-130 grams  Fluid:  >/= 2 L/d  Parks Ranger, MS, RDN, LDN 08/22/2017 2:51 PM

## 2017-08-22 NOTE — Progress Notes (Signed)
Patient ID: Arthur Burke, male   DOB: 11-Dec-1965, 52 y.o.   MRN: [id number] No operating room time available today, plan for BKA surgery Saturday AM 7:30

## 2017-08-22 NOTE — Progress Notes (Signed)
Pharmacy Antibiotic Note  Arthur Burke is a 52 y.o. male admitted on 08/21/2017 with fever, chills and N/V. Pt is s/p kidney transplant in 2005 and is on mycophenolate, tacrolimus, prednisone.  Pharmacy consulted for vancomycin and cefepime for osteomyelitis. Orthopedics has evaluated the patient and plans for amputation tomorrow morning. WBC elevated, tmax/24h 100.9. Renal function is stable.  Plan: -Vancomycin 1g IV q12h -Cefepime 2g IV q8h -Monitor renal fx, cultures, VR as needed -Follow length of therapy post-op   Height: 5\' 11"  (180.3 cm) Weight: 177 lb 11.1 oz (80.6 kg) IBW/kg (Calculated) : 75.3  Temp (24hrs), Avg:99.3 F (37.4 C), Min:98.6 F (37 C), Max:100.9 F (38.3 C)  Recent Labs  Lab 08/21/17 0457 08/21/17 0521 08/22/17 0452  WBC 18.2*  --  22.0*  CREATININE 1.26*  --  1.29*  LATICACIDVEN  --  1.34  --     Estimated Creatinine Clearance: 73 mL/min (A) (by C-G formula based on SCr of 1.29 mg/dL (H)).    Allergies  Allergen Reactions  . Zolpidem Other (See Comments)    Other Reaction: CNS Disorder    Antimicrobials this admission: 1/10 vancomycin > 1/10 zosyn >  Dose adjustments this admission: N/A  Microbiology results: 1/10 blood cx: 1/10 wound cx: abundant GNR and GPC, rare GPR  Shavonn Convey D. Lizzie Cokley, PharmD, BCPS Clinical Pharmacist Clinical Phone for 08/22/2017 until 3:30pm: [phone] If after 3:30pm, please call main pharmacy at [phone] 08/22/2017 11:58 AM

## 2017-08-22 NOTE — Progress Notes (Signed)
PT Cancellation Note  Patient Details Name: Arthur Burke MRN: [id number] DOB: Apr 29, 1966   Cancelled Treatment:    Reason Eval/Treat Not Completed: Patient at procedure or test/unavailable. Pt preparing to go to OR soon. RN requesting PT wait to perform evaluation. PT will continue to f/u with pt as available.    [place] 08/22/2017, 11:01 AM

## 2017-08-22 NOTE — Progress Notes (Signed)
OT Cancellation Note  Patient Details Name: Arthur Burke MRN: [id number] DOB: 10-May-1966   Cancelled Treatment:    Reason Eval/Treat Not Completed: Medical issues which prohibited therapy(Pt with plan for L transtibial amputation today.)  Malka So 08/22/2017, 1:15 PM  08/22/2017 Nestor Lewandowsky, OTR/L Pager: [phone]

## 2017-08-22 NOTE — H&P (View-Only) (Signed)
Patient ID: Arthur Burke, male   DOB: 10/14/1965, 52 y.o.   MRN: [id number] No operating room time available today, plan for BKA surgery Saturday AM 7:30

## 2017-08-23 ENCOUNTER — Inpatient Hospital Stay (HOSPITAL_COMMUNITY): Payer: Medicare Other | Admitting: Certified Registered Nurse Anesthetist

## 2017-08-23 ENCOUNTER — Encounter (HOSPITAL_COMMUNITY): Payer: Self-pay | Admitting: Certified Registered Nurse Anesthetist

## 2017-08-23 ENCOUNTER — Encounter (HOSPITAL_COMMUNITY)
Admission: EM | Disposition: A | Discharge status: Home / Self Care | Payer: Self-pay | Source: Home / Self Care | Attending: Oncology

## 2017-08-23 DIAGNOSIS — G8929 Other chronic pain: Secondary | ICD-10-CM

## 2017-08-23 DIAGNOSIS — M549 Dorsalgia, unspecified: Secondary | ICD-10-CM

## 2017-08-23 DIAGNOSIS — B955 Unspecified streptococcus as the cause of diseases classified elsewhere: Secondary | ICD-10-CM

## 2017-08-23 DIAGNOSIS — E1051 Type 1 diabetes mellitus with diabetic peripheral angiopathy without gangrene: Secondary | ICD-10-CM

## 2017-08-23 DIAGNOSIS — Z940 Kidney transplant status: Secondary | ICD-10-CM

## 2017-08-23 DIAGNOSIS — R7881 Bacteremia: Secondary | ICD-10-CM

## 2017-08-23 DIAGNOSIS — Z978 Presence of other specified devices: Secondary | ICD-10-CM

## 2017-08-23 DIAGNOSIS — L97529 Non-pressure chronic ulcer of other part of left foot with unspecified severity: Secondary | ICD-10-CM

## 2017-08-23 DIAGNOSIS — I70209 Unspecified atherosclerosis of native arteries of extremities, unspecified extremity: Secondary | ICD-10-CM

## 2017-08-23 DIAGNOSIS — Z90410 Acquired total absence of pancreas: Secondary | ICD-10-CM

## 2017-08-23 DIAGNOSIS — E891 Postprocedural hypoinsulinemia: Secondary | ICD-10-CM

## 2017-08-23 DIAGNOSIS — Z8619 Personal history of other infectious and parasitic diseases: Secondary | ICD-10-CM

## 2017-08-23 DIAGNOSIS — E10621 Type 1 diabetes mellitus with foot ulcer: Secondary | ICD-10-CM

## 2017-08-23 DIAGNOSIS — I70208 Unspecified atherosclerosis of native arteries of extremities, other extremity: Secondary | ICD-10-CM

## 2017-08-23 DIAGNOSIS — E1040 Type 1 diabetes mellitus with diabetic neuropathy, unspecified: Secondary | ICD-10-CM

## 2017-08-23 DIAGNOSIS — Z888 Allergy status to other drugs, medicaments and biological substances status: Secondary | ICD-10-CM

## 2017-08-23 DIAGNOSIS — Z89512 Acquired absence of left leg below knee: Secondary | ICD-10-CM

## 2017-08-23 DIAGNOSIS — Z79899 Other long term (current) drug therapy: Secondary | ICD-10-CM

## 2017-08-23 HISTORY — PX: AMPUTATION: SHX166

## 2017-08-23 HISTORY — PX: APPLICATION OF WOUND VAC: SHX5189

## 2017-08-23 LAB — CBC
HCT: 35.2 % — ABNORMAL LOW (ref 39.0–52.0)
Hemoglobin: 11.6 g/dL — ABNORMAL LOW (ref 13.0–17.0)
MCH: 28.2 pg (ref 26.0–34.0)
MCHC: 33 g/dL (ref 30.0–36.0)
MCV: 85.4 fL (ref 78.0–100.0)
PLATELETS: 312 10*3/uL (ref 150–400)
RBC: 4.12 MIL/uL — ABNORMAL LOW (ref 4.22–5.81)
RDW: 13.4 % (ref 11.5–15.5)
WBC: 15.8 10*3/uL — AB (ref 4.0–10.5)

## 2017-08-23 LAB — BASIC METABOLIC PANEL
Anion gap: 8 (ref 5–15)
BUN: 21 mg/dL — AB (ref 6–20)
CALCIUM: 8.7 mg/dL — AB (ref 8.9–10.3)
CO2: 23 mmol/L (ref 22–32)
CREATININE: 1.07 mg/dL (ref 0.61–1.24)
Chloride: 99 mmol/L — ABNORMAL LOW (ref 101–111)
GFR calc Af Amer: >60 mL/min (ref >60)
Glucose, Bld: 316 mg/dL — ABNORMAL HIGH (ref 65–99)
Potassium: 4.5 mmol/L (ref 3.5–5.1)
SODIUM: 130 mmol/L — AB (ref 135–145)

## 2017-08-23 LAB — GLUCOSE, CAPILLARY
GLUCOSE-CAPILLARY: 221 mg/dL — AB (ref 65–99)
GLUCOSE-CAPILLARY: 276 mg/dL — AB (ref 65–99)
GLUCOSE-CAPILLARY: 312 mg/dL — AB (ref 65–99)
Glucose-Capillary: 226 mg/dL — ABNORMAL HIGH (ref 65–99)
Glucose-Capillary: 215 mg/dL — ABNORMAL HIGH (ref 65–99)
Glucose-Capillary: 329 mg/dL — ABNORMAL HIGH (ref 65–99)
Glucose-Capillary: 422 mg/dL — ABNORMAL HIGH (ref 65–99)

## 2017-08-23 SURGERY — AMPUTATION BELOW KNEE
Anesthesia: Monitor Anesthesia Care | Site: Leg Lower | Laterality: Left

## 2017-08-23 MED ORDER — INSULIN ASPART 100 UNIT/ML ~~LOC~~ SOLN
5.0000 [IU] | Freq: Once | SUBCUTANEOUS | Status: AC
  Administered 2017-08-23: 5 [IU] via SUBCUTANEOUS

## 2017-08-23 MED ORDER — MIDAZOLAM HCL 2 MG/2ML IJ SOLN
INTRAMUSCULAR | Status: DC | PRN
  Administered 2017-08-23: 2 mg via INTRAVENOUS

## 2017-08-23 MED ORDER — ROPIVACAINE HCL 7.5 MG/ML IJ SOLN
INTRAMUSCULAR | Status: DC | PRN
  Administered 2017-08-23 (×2): 10 mL via PERINEURAL

## 2017-08-23 MED ORDER — BISACODYL 10 MG RE SUPP: 10.0000 mg | Freq: Every day | RECTAL | Status: DC | PRN

## 2017-08-23 MED ORDER — PROPOFOL 10 MG/ML IV BOLUS
INTRAVENOUS | Status: AC
  Filled 2017-08-23: qty 20

## 2017-08-23 MED ORDER — ACETAMINOPHEN 650 MG RE SUPP: 650.0000 mg | RECTAL | Status: DC | PRN

## 2017-08-23 MED ORDER — INSULIN GLARGINE 100 UNIT/ML ~~LOC~~ SOLN
30.0000 [IU] | Freq: Every day | SUBCUTANEOUS | Status: DC
  Administered 2017-08-23 – 2017-08-24 (×2): 30 [IU] via SUBCUTANEOUS
  Filled 2017-08-23 (×2): qty 0.3

## 2017-08-23 MED ORDER — OXYCODONE HCL 5 MG PO TABS
10.0000 mg | ORAL_TABLET | ORAL | Status: DC | PRN
  Administered 2017-08-23 – 2017-08-24 (×6): 10 mg via ORAL
  Filled 2017-08-23 (×6): qty 2

## 2017-08-23 MED ORDER — FENTANYL CITRATE (PF) 100 MCG/2ML IJ SOLN
INTRAMUSCULAR | Status: DC | PRN
  Administered 2017-08-23: 100 ug via INTRAVENOUS
  Administered 2017-08-23: 25 ug via INTRAVENOUS

## 2017-08-23 MED ORDER — MIDAZOLAM HCL 2 MG/2ML IJ SOLN
INTRAMUSCULAR | Status: AC
  Filled 2017-08-23: qty 2

## 2017-08-23 MED ORDER — INSULIN GLARGINE 100 UNIT/ML ~~LOC~~ SOLN
5.0000 [IU] | Freq: Once | SUBCUTANEOUS | Status: DC
  Filled 2017-08-23: qty 0.05

## 2017-08-23 MED ORDER — FENTANYL CITRATE (PF) 250 MCG/5ML IJ SOLN
INTRAMUSCULAR | Status: AC
  Filled 2017-08-23: qty 5

## 2017-08-23 MED ORDER — SENNOSIDES-DOCUSATE SODIUM 8.6-50 MG PO TABS
1.0000 | ORAL_TABLET | Freq: Two times a day (BID) | ORAL | Status: DC
  Administered 2017-08-23 – 2017-08-26 (×6): 1 via ORAL
  Filled 2017-08-23 (×6): qty 1

## 2017-08-23 MED ORDER — ONDANSETRON HCL 4 MG PO TABS: 4.0000 mg | ORAL_TABLET | Freq: Four times a day (QID) | ORAL | Status: DC | PRN

## 2017-08-23 MED ORDER — PROPOFOL 10 MG/ML IV BOLUS
INTRAVENOUS | Status: DC | PRN
  Administered 2017-08-23: 30 mg via INTRAVENOUS

## 2017-08-23 MED ORDER — ONDANSETRON HCL 4 MG/2ML IJ SOLN: 4.0000 mg | Freq: Four times a day (QID) | INTRAMUSCULAR | Status: DC | PRN

## 2017-08-23 MED ORDER — 0.9 % SODIUM CHLORIDE (POUR BTL) OPTIME
TOPICAL | Status: DC | PRN
  Administered 2017-08-23: 1000 mL

## 2017-08-23 MED ORDER — ACETAMINOPHEN 325 MG PO TABS
650.0000 mg | ORAL_TABLET | ORAL | Status: DC | PRN
  Filled 2017-08-23: qty 2

## 2017-08-23 MED ORDER — METHOCARBAMOL 500 MG PO TABS
500.0000 mg | ORAL_TABLET | Freq: Four times a day (QID) | ORAL | Status: DC | PRN
  Administered 2017-08-23 – 2017-08-24 (×4): 500 mg via ORAL
  Filled 2017-08-23 (×4): qty 1

## 2017-08-23 MED ORDER — OXYCODONE HCL 5 MG PO TABS: 5.0000 mg | ORAL_TABLET | Freq: Once | ORAL | Status: DC | PRN

## 2017-08-23 MED ORDER — FENTANYL CITRATE (PF) 100 MCG/2ML IJ SOLN: 25.0000 ug | INTRAMUSCULAR | Status: DC | PRN

## 2017-08-23 MED ORDER — PROPOFOL 1000 MG/100ML IV EMUL
INTRAVENOUS | Status: AC
  Filled 2017-08-23: qty 100

## 2017-08-23 MED ORDER — METOCLOPRAMIDE HCL 5 MG PO TABS: 5.0000 mg | ORAL_TABLET | Freq: Three times a day (TID) | ORAL | Status: DC | PRN

## 2017-08-23 MED ORDER — SODIUM CHLORIDE 0.9 % IV SOLN
INTRAVENOUS | Status: DC
Start: 2017-08-23 — End: 2017-08-24
  Administered 2017-08-23 (×2): via INTRAVENOUS

## 2017-08-23 MED ORDER — HYDROMORPHONE HCL 1 MG/ML IJ SOLN
1.0000 mg | INTRAMUSCULAR | Status: DC | PRN
  Administered 2017-08-23 – 2017-08-26 (×17): 1 mg via INTRAVENOUS
  Filled 2017-08-23 (×17): qty 1

## 2017-08-23 MED ORDER — DOCUSATE SODIUM 100 MG PO CAPS
100.0000 mg | ORAL_CAPSULE | Freq: Two times a day (BID) | ORAL | Status: DC
  Administered 2017-08-23: 100 mg via ORAL
  Filled 2017-08-23: qty 1

## 2017-08-23 MED ORDER — OXYCODONE HCL 5 MG PO TABS: 5.0000 mg | ORAL_TABLET | ORAL | Status: DC | PRN

## 2017-08-23 MED ORDER — OXYCODONE HCL 5 MG/5ML PO SOLN: 5.0000 mg | Freq: Once | ORAL | Status: DC | PRN

## 2017-08-23 MED ORDER — METHOCARBAMOL 1000 MG/10ML IJ SOLN: 500.0000 mg | Freq: Four times a day (QID) | INTRAVENOUS | Status: DC | PRN

## 2017-08-23 MED ORDER — INSULIN ASPART 100 UNIT/ML ~~LOC~~ SOLN
0.0000 [IU] | Freq: Three times a day (TID) | SUBCUTANEOUS | Status: DC
  Administered 2017-08-23: 15 [IU] via SUBCUTANEOUS
  Administered 2017-08-24: 5 [IU] via SUBCUTANEOUS
  Administered 2017-08-24: 3 [IU] via SUBCUTANEOUS
  Administered 2017-08-24: 5 [IU] via SUBCUTANEOUS
  Administered 2017-08-24: 2 [IU] via SUBCUTANEOUS
  Administered 2017-08-25: 3 [IU] via SUBCUTANEOUS
  Administered 2017-08-25: 8 [IU] via SUBCUTANEOUS
  Administered 2017-08-25: 2 [IU] via SUBCUTANEOUS
  Administered 2017-08-26: 8 [IU] via SUBCUTANEOUS
  Administered 2017-08-26: 3 [IU] via SUBCUTANEOUS

## 2017-08-23 MED ORDER — PROPOFOL 500 MG/50ML IV EMUL
INTRAVENOUS | Status: DC | PRN
  Administered 2017-08-23: 100 ug/kg/min via INTRAVENOUS

## 2017-08-23 MED ORDER — SODIUM CHLORIDE 0.9 % IV SOLN: INTRAVENOUS | Status: DC

## 2017-08-23 MED ORDER — LIDOCAINE HCL (CARDIAC) 20 MG/ML IV SOLN
INTRAVENOUS | Status: DC | PRN
  Administered 2017-08-23: 100 mg via INTRAVENOUS

## 2017-08-23 MED ORDER — POLYETHYLENE GLYCOL 3350 17 G PO PACK: 17.0000 g | PACK | Freq: Every day | ORAL | Status: DC | PRN

## 2017-08-23 MED ORDER — BUPIVACAINE-EPINEPHRINE (PF) 0.5% -1:200000 IJ SOLN
INTRAMUSCULAR | Status: DC | PRN
  Administered 2017-08-23: 10 mL via PERINEURAL
  Administered 2017-08-23: 20 mL via PERINEURAL

## 2017-08-23 MED ORDER — METOCLOPRAMIDE HCL 5 MG/ML IJ SOLN: 5.0000 mg | Freq: Three times a day (TID) | INTRAMUSCULAR | Status: DC | PRN

## 2017-08-23 MED ORDER — ONDANSETRON HCL 4 MG/2ML IJ SOLN
INTRAMUSCULAR | Status: DC | PRN
  Administered 2017-08-23: 4 mg via INTRAVENOUS

## 2017-08-23 MED ORDER — MAGNESIUM CITRATE PO SOLN
1.0000 | Freq: Once | ORAL | Status: AC | PRN
  Administered 2017-08-25: 1 via ORAL
  Filled 2017-08-23: qty 296

## 2017-08-23 SURGICAL SUPPLY — 32 items
BLADE SAW RECIP 87.9 MT (BLADE) ×6 IMPLANT
BLADE SURG 21 STRL SS (BLADE) ×3 IMPLANT
BNDG COHESIVE 6X5 TAN STRL LF (GAUZE/BANDAGES/DRESSINGS) ×6 IMPLANT
BNDG GAUZE ELAST 4 BULKY (GAUZE/BANDAGES/DRESSINGS) ×6 IMPLANT
COVER SURGICAL LIGHT HANDLE (MISCELLANEOUS) ×3 IMPLANT
CUFF TOURNIQUET SINGLE 34IN LL (TOURNIQUET CUFF) IMPLANT
CUFF TOURNIQUET SINGLE 44IN (TOURNIQUET CUFF) IMPLANT
DRAPE INCISE IOBAN 66X45 STRL (DRAPES) IMPLANT
DRAPE U-SHAPE 47X51 STRL (DRAPES) ×3 IMPLANT
DRESSING PREVENA PLUS CUSTOM (GAUZE/BANDAGES/DRESSINGS) ×2 IMPLANT
DRSG PREVENA PLUS CUSTOM (GAUZE/BANDAGES/DRESSINGS) ×6
ELECT REM PT RETURN 9FT ADLT (ELECTROSURGICAL) ×3
ELECTRODE REM PT RTRN 9FT ADLT (ELECTROSURGICAL) ×1 IMPLANT
GLOVE BIOGEL PI IND STRL 9 (GLOVE) ×1 IMPLANT
GLOVE BIOGEL PI INDICATOR 9 (GLOVE) ×2
GLOVE SURG ORTHO 9.0 STRL STRW (GLOVE) ×3 IMPLANT
GOWN STRL REUS W/ TWL XL LVL3 (GOWN DISPOSABLE) ×2 IMPLANT
GOWN STRL REUS W/TWL XL LVL3 (GOWN DISPOSABLE) ×4
KIT BASIN OR (CUSTOM PROCEDURE TRAY) ×3 IMPLANT
KIT ROOM TURNOVER OR (KITS) ×3 IMPLANT
MANIFOLD NEPTUNE II (INSTRUMENTS) ×3 IMPLANT
NS IRRIG 1000ML POUR BTL (IV SOLUTION) ×3 IMPLANT
PACK ORTHO EXTREMITY (CUSTOM PROCEDURE TRAY) ×3 IMPLANT
PAD ARMBOARD 7.5X6 YLW CONV (MISCELLANEOUS) ×3 IMPLANT
SPONGE LAP 18X18 X RAY DECT (DISPOSABLE) IMPLANT
STAPLER VISISTAT 35W (STAPLE) IMPLANT
STOCKINETTE IMPERVIOUS LG (DRAPES) ×3 IMPLANT
SUT SILK 2 0 (SUTURE) ×2
SUT SILK 2-0 18XBRD TIE 12 (SUTURE) ×1 IMPLANT
SUT VIC AB 1 CTX 27 (SUTURE) IMPLANT
TOWEL OR 17X26 10 PK STRL BLUE (TOWEL DISPOSABLE) ×3 IMPLANT
WND VAC CANISTER 500ML (MISCELLANEOUS) ×3 IMPLANT

## 2017-08-23 NOTE — Interval H&P Note (Signed)
History and Physical Interval Note:  08/23/2017 7:38 AM  Arthur Burke  has presented today for surgery, with the diagnosis of osteomeylitis left foot  The various methods of treatment have been discussed with the patient and family. After consideration of risks, benefits and other options for treatment, the patient has consented to  Procedure(s): AMPUTATION BELOW KNEE (Left) APPLICATION OF WOUND VAC (Left) as a surgical intervention .  The patient's history has been reviewed, patient examined, no change in status, stable for surgery.  I have reviewed the patient's chart and labs.  Questions were answered to the patient's satisfaction.     Newt Minion

## 2017-08-23 NOTE — Anesthesia Procedure Notes (Signed)
Anesthesia Regional Block: Popliteal block   Pre-Anesthetic Checklist: ,, timeout performed, Correct Patient, Correct Site, Correct Laterality, Correct Procedure, Correct Position, site marked, Risks and benefits discussed,  Surgical consent,  Pre-op evaluation,  At surgeon's request and post-op pain management  Laterality: Lower and Left  Prep: chloraprep       Needles:  Injection technique: Single-shot  Needle Type: Echogenic Needle          Additional Needles:   Procedures:, nerve stimulator,,,, intact distal pulses,,,   Nerve Stimulator or Paresthesia:  Response: plantar, 0.4 mA,   Additional Responses:   Narrative:  Start time: 08/23/2017 7:30 AM End time: 08/23/2017 7:45 AM Injection made incrementally with aspirations every 5 mL.  Performed by: Personally   Additional Notes: H+P and labs reviewed, risks and benefits discussed with patient, procedure tolerated well without complications

## 2017-08-23 NOTE — Anesthesia Postprocedure Evaluation (Signed)
Anesthesia Post Note  Patient: Arthur Burke  Procedure(s) Performed: AMPUTATION BELOW KNEE (Left Leg Lower) APPLICATION OF WOUND VAC (Left Leg Lower)     Patient location during evaluation: PACU Anesthesia Type: MAC and Regional Level of consciousness: awake and alert Pain management: pain level controlled Vital Signs Assessment: post-procedure vital signs reviewed and stable Respiratory status: spontaneous breathing, nonlabored ventilation, respiratory function stable and patient connected to nasal cannula oxygen Cardiovascular status: stable and blood pressure returned to baseline Postop Assessment: no apparent nausea or vomiting Anesthetic complications: no    Last Vitals:  Vitals:   08/23/17 0921 08/23/17 0930  BP: (!) 148/74   Pulse: 65 64  Resp: (!) 7 14  Temp:  (!) 36.3 C  SpO2: 97% 97%    Last Pain:  Vitals:   08/23/17 0920  TempSrc:   PainSc: 9                  Moses Odoherty

## 2017-08-23 NOTE — Progress Notes (Signed)
Orthopedic Tech Progress Note Patient Details:  Arthur Burke 19-Sep-1965 [id number]  Patient ID: Arthur Burke, male   DOB: 08-16-1965, 52 y.o.   MRN: [id number]   Arthur Burke 08/23/2017, 5:26 PMCalled [facility] for stump shrinker.

## 2017-08-23 NOTE — Anesthesia Preprocedure Evaluation (Signed)
Anesthesia Evaluation  Patient identified by MRN, date of birth, ID band Patient awake    Reviewed: Allergy & Precautions, NPO status , Patient's Chart, lab work & pertinent test results  History of Anesthesia Complications Negative for: history of anesthetic complications  Airway Mallampati: II  TM Distance: >3 FB Neck ROM: Full    Dental  (+) Teeth Intact   Pulmonary neg pulmonary ROS,    breath sounds clear to auscultation       Cardiovascular hypertension, Pt. 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, left UE av fistula  Reproductive/Obstetrics                             Anesthesia Physical Anesthesia Plan  ASA: III  Anesthesia Plan: MAC and Regional   Post-op Pain Management:    Induction:   PONV Risk Score and Plan: 1 and Ondansetron  Airway Management Planned: Nasal Cannula  Additional Equipment: None  Intra-op Plan:   Post-operative Plan:   Informed Consent: I have reviewed the patients History and Physical, chart, labs and discussed the procedure including the risks, benefits and alternatives for the proposed anesthesia with the patient or authorized representative who has indicated his/her understanding and acceptance.   Dental advisory given  Plan Discussed with: CRNA and Surgeon  Anesthesia Plan Comments:         Anesthesia Quick Evaluation

## 2017-08-23 NOTE — Op Note (Addendum)
   Date of Surgery: 08/23/2017  INDICATIONS: Mr. Arthur Burke is a 52 y.o.-year-old male who has severe peripheral vascular disease.  He has had chronic osteomyelitis of the left calcaneus is status post previous partial calcanectomy with MRI scan showing recurrent osteomyelitis he also has extensive necrosis anteriorly over the ankle of the anterior tibial tendon.Marland Kitchen  PREOPERATIVE DIAGNOSIS: Osteomyelitis and necrotic ulcer left ankle  POSTOPERATIVE DIAGNOSIS: Same.  PROCEDURE: Transtibial amputation Application of Prevena wound VAC  SURGEON: Sharol Given, M.D.  ANESTHESIA:  general  IV FLUIDS AND URINE: See anesthesia.  ESTIMATED BLOOD LOSS: Minimal mL.  COMPLICATIONS: None.  DESCRIPTION OF PROCEDURE: The patient was brought to the operating room after undergoing a femoral and popliteal block.  After adequate levels of anesthesia were obtained patient's lower extremity was prepped using DuraPrep draped into a sterile field. A timeout was called. The foot was draped out of the sterile field with impervious stockinette. A transverse incision was made 11 cm distal to the tibial tubercle. This curved proximally and a large posterior flap was created. The tibia was transected 1 cm proximal to the skin incision. The fibula was transected just proximal to the tibial incision. The tibia was beveled anteriorly. A large posterior flap was created. The sciatic nerve was pulled cut and allowed to retract. The vascular bundles were suture ligated with 2-0 silk. The deep and superficial fascial layers were closed using #1 Vicryl. The skin was closed using staples and 2-0 nylon. The wound was covered with a Prevena wound VAC. There was a good suction fit. A prosthetic shrinker will be applied on the floor.   Patient was taken to the PACU in stable condition.   DISCHARGE PLANNING:  Antibiotic duration: Continue antibiotics for 24 hours postoperatively and then discontinue.  There was no infection at the amputation  site.  Weightbearing: Nonweightbearing on the left.  Pain medication: Oral and IV medications ordered  Dressing care/ Wound VAC: Continue wound VAC for 1 week  Discharge to: Anticipate discharge to skilled nursing  Follow-up: In the office 1 week post operative.  Meridee Score, MD [place] 8:50 AM

## 2017-08-23 NOTE — Progress Notes (Signed)
   Subjective: Seen after returning from his room from surgery this morning. Says his only concern is that chronic back pain is not controlled post procedure.   Objective:  Vital signs in last 24 hours: Vitals:   08/23/17 0921 08/23/17 0930 08/23/17 0949 08/23/17 0951  BP: (!) 148/74  (!) 148/70   Pulse: 65 64 65   Resp: (!) 7 14 16    Temp:  (!) 97.3 F (36.3 C) 97.6 F (36.4 C)   TempSrc:   Oral   SpO2: 97% 97% 99% 99%  Weight:      Height:       General: well appearing, no acute distress  Cardiac: RRR, no murmur appreciated, no peripheral edema  Pulm: lungs clear to auscultation b/l  MSK: L leg BKA with wound vac in place   Assessment/Plan:    Acute osteomyelitis of left calcaneus (HCC)    Lower limb ulcer, ankle, left, with necrosis of muscle (HCC)   Infected ulcer of skin, with fat layer exposed ([place]) - S/p R BKA this morning  - Pain regiment increased today, currently: dilaudid 1 mg q2h prn, oxycodone 10 mg q3h PRN and oxycodone 5 mg q3 hr PRN - senokot scheduled, mag citrate PRN, miralax prn  - blood cultures 1/10 with GPC in anaerobic bottles only, awaiting speciation and sensitivities - repeat blood cultures today  - continue vanc and cefepime  - consulting ID   Type 1 diabetes    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   Ledell Noss,  MD 08/23/2017, 2:05 PM Pager: [phone]

## 2017-08-23 NOTE — Transfer of Care (Signed)
Immediate Anesthesia Transfer of Care Note  Patient: Arthur Burke  Procedure(s) Performed: AMPUTATION BELOW KNEE (Left Leg Lower) APPLICATION OF WOUND VAC (Left Leg Lower)  Patient Location: PACU  Anesthesia Type:MAC combined with regional for post-op pain  Level of Consciousness: awake, alert  and oriented  Airway & Oxygen Therapy: Patient Spontanous Breathing and Patient connected to face mask oxygen  Post-op Assessment: Report given to RN and Post -op Vital signs reviewed and stable  Post vital signs: Reviewed and stable  Last Vitals:  Vitals:   08/22/17 2129 08/23/17 0555  BP: 140/60 134/64  Pulse: 65 60  Resp: 18 16  Temp: (!) 36.3 C 36.6 C  SpO2: 100% 97%    Last Pain:  Vitals:   08/23/17 0555  TempSrc: Oral  PainSc:       Patients Stated Pain Goal: 4 (56/70/14 1030)  Complications: No apparent anesthesia complications

## 2017-08-23 NOTE — Progress Notes (Signed)
  Date: 08/23/2017  Patient name: Arthur Burke  Medical record number: [id number]  Date of birth: 1966/03/25   I have seen and evaluated this patient and I have discussed the plan of care with the house staff. Please see their note for complete details. I concur with their findings with the following additions/corrections:   Doing well post BKA this morning. We'll try to adjust his pain management regimen in light of his home prescription of oxycodone. Appreciate ID evaluating him and recommending narrowing to vancomycin alone given his streptococcal bacteremia.  Oda Kilts, MD 08/23/2017, 7:26 PM

## 2017-08-23 NOTE — Anesthesia Procedure Notes (Signed)
Anesthesia Regional Block: Femoral nerve block   Pre-Anesthetic Checklist: ,, timeout performed, Correct Patient, Correct Site, Correct Laterality, Correct Procedure, Correct Position, site marked, Risks and benefits discussed,  Surgical consent,  Pre-op evaluation,  At surgeon's request and post-op pain management  Laterality: Lower and Left  Prep: chloraprep       Needles:  Injection technique: Single-shot  Needle Type: Echogenic Stimulator Needle          Additional Needles:   Procedures:, nerve stimulator,,,,,,,   Nerve Stimulator or Paresthesia:  Response: quad, 0.4 mA,   Additional Responses:   Narrative:  Start time: 08/23/2017 7:30 AM End time: 08/23/2017 7:45 AM Injection made incrementally with aspirations every 5 mL.  Performed by: Personally  Anesthesiologist: Oleta Mouse, MD  Additional Notes: H+P and labs reviewed, risks and benefits discussed with patient, procedure tolerated well without complications

## 2017-08-23 NOTE — Consult Note (Signed)
Arthur Burke for Infectious Disease    Date of Admission:  08/21/2017           Day 4 vancomycin        Day 2 cefepime       Reason for Consult: Streptococcal bacteremia    Referring Provider: Dr. Ledell Noss  Assessment: He had transient streptococcal bacteremia related to his diabetic foot infection.  I would continue vancomycin pending final speciation and antibiotic susceptibilities.  Plan: 1. Continue vancomycin 2. 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 amLODipine  10 mg Oral Daily  . atorvastatin  40 mg Oral QPM  . carvedilol  25 mg Oral BID  . Chlorhexidine Gluconate Cloth  6 each Topical Daily  . cloNIDine  0.1 mg Oral BID  . feeding supplement (GLUCERNA SHAKE)  237 mL Oral BID BM  . insulin aspart  0-15 Units Subcutaneous TID WC  . insulin glargine  30 Units Subcutaneous QHS  . mupirocin ointment  1 application Nasal BID  . mycophenolate  500 mg Oral BID  . nutrition supplement (JUVEN)  1 packet Oral BID WC  . pantoprazole  40 mg Oral Daily  . predniSONE  5 mg Oral Daily  . ramipril  20 mg Oral Daily  . senna-docusate  1 tablet Oral BID  . sodium chloride flush  3 mL Intravenous Q12H  . sodium chloride flush  3 mL Intravenous Q12H  . tacrolimus  1 mg Oral BID  . vitamin B-12  500 mcg Oral Daily   Continuous Infusions: . sodium chloride    . sodium chloride 10 mL/hr at 08/23/17 0658  . sodium chloride    . ceFEPime (MAXIPIME) IV 2 g (08/23/17 1435)  .  methocarbamol (ROBAXIN)  IV    . vancomycin 1,000 mg (08/23/17 1102)   PRN Meds:.sodium chloride, acetaminophen **OR** acetaminophen, bisacodyl, HYDROmorphone (DILAUDID) injection, magnesium citrate, methocarbamol **OR** methocarbamol (ROBAXIN)  IV, metoCLOPramide **OR** metoCLOPramide (REGLAN) injection, ondansetron **OR** ondansetron (ZOFRAN) IV, oxyCODONE, oxyCODONE, polyethylene glycol, sodium chloride flush  HPI: Arthur Burke is a 52 y.o. male with type 1 diabetes who has had problems with a diabetic foot ulcer on his left heel for several years.  He recently developed an ulcer on the top of his left foot from his boot.  He has been followed by his podiatrist locally.  He had sudden onset of fever and chills on 08/20/2017.  He also noted some swelling, redness and warmth of his left foot.  He was admitted and an MRI showed calcaneal osteomyelitis with intraosseous abscess.  Blood cultures were obtained and he was started on broad empiric antibiotic therapy.  He underwent left BKA today.  He is feeling much better other than the chronic pain.  1 of 2 admission blood cultures is growing strep species.  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  Fortunately he has done well with regard to his renal transplant and required any further hemodialysis.   Review of Systems: Review of Systems  Constitutional: Positive for chills, fever, malaise/fatigue and weight loss. Negative for diaphoresis.  HENT: Negative for congestion and sore throat.   Respiratory: Negative for cough, sputum production and shortness of breath.   Cardiovascular: Negative for chest pain.  Gastrointestinal: Positive for nausea and vomiting. Negative for abdominal pain and diarrhea.  Genitourinary: Negative for dysuria.  Musculoskeletal: Positive for back pain.  Skin:  Negative for rash.  Neurological: Positive for sensory change. Negative for dizziness.    Past Medical History:  Diagnosis Date  . Diabetes mellitus without complication ([place])   . Diabetic foot infection ([place])   . Hypertension   . MRSA (methicillin resistant staph aureus) culture positive   . Renal disorder   . Vision impairment     Social History   Tobacco Use  . Smoking status: Never Smoker  . Smokeless tobacco: Never Used  Substance Use Topics  . Alcohol use: No    Frequency: Never  . Drug use: Not on file    History reviewed. No pertinent family history. Allergies  Allergen Reactions  . Zolpidem Other (See Comments)    Other Reaction: CNS Disorder    OBJECTIVE: Blood pressure (!) 148/70, pulse 65, temperature 97.6 F (36.4 C), temperature source Oral, resp. rate 16, height 5\' 11"  (1.803 m), weight 177 lb 11.1 oz (80.6 kg), SpO2 99 %.  Physical Exam  Constitutional: He is oriented to person, place, and time.  He is sitting up in bed watching television.  He is talkative and in good spirits.  Cardiovascular: Normal rate and regular rhythm.  No murmur heard. Pulmonary/Chest: Effort normal. He has no wheezes. He has no rales.  Abdominal: Soft. He exhibits no distension. There is no tenderness.  Musculoskeletal:  There is a VAC dressing on his left BKA stump.  He has a fistula in his left forearm.  Neurological: He is alert and oriented to person, place, and time.  Skin: No rash noted.  Psychiatric: Mood and affect normal.    Lab Results Lab Results  Component Value Date   WBC 15.8 (H) 08/23/2017   HGB 11.6 (L) 08/23/2017   HCT 35.2 (L) 08/23/2017   MCV 85.4 08/23/2017   PLT 312 08/23/2017    Lab Results  Component Value Date   CREATININE 1.07 08/23/2017   BUN 21 (H) 08/23/2017   NA 130 (L) 08/23/2017   K 4.5 08/23/2017   CL 99 (L) 08/23/2017   CO2 23 08/23/2017    Lab Results  Component Value Date   ALT 9 (L) 08/21/2017   AST 13 (L) 08/21/2017    ALKPHOS 116 08/21/2017   BILITOT 1.0 08/21/2017     Microbiology: Recent Results (from the past 240 hour(s))  Wound or Superficial Culture     Status: None (Preliminary result)   Collection Time: 08/21/17  5:01 AM  Result Value Ref Range Status   Specimen Description WOUND ANKLE  Final   Special Requests Immunocompromised  Final   Gram Stain   Final    MODERATE WBC PRESENT, PREDOMINANTLY PMN ABUNDANT GRAM NEGATIVE RODS ABUNDANT GRAM POSITIVE COCCI RARE GRAM POSITIVE RODS    Culture   Final    ABUNDANT STAPHYLOCOCCUS AUREUS SUSCEPTIBILITIES TO FOLLOW    Report Status PENDING  Incomplete  Blood Culture (routine x 2)     Status: None (Preliminary result)  Collection Time: 08/21/17  5:08 AM  Result Value Ref Range Status   Specimen Description BLOOD RIGHT ARM  Final   Special Requests   Final    BOTTLES DRAWN AEROBIC AND ANAEROBIC Blood Culture adequate volume   Culture  Setup Time   Final    GRAM POSITIVE COCCI ANAEROBIC BOTTLE ONLY CRITICAL RESULT CALLED TO, READ BACK BY AND VERIFIED WITH: AMASTERS,PHARMD @2300  08/22/17 BY LHOWARD    Culture   Final    GRAM POSITIVE COCCI CULTURE REINCUBATED FOR BETTER GROWTH    Report Status PENDING  Incomplete  Blood Culture ID Panel (Reflexed)     Status: Abnormal   Collection Time: 08/21/17  5:08 AM  Result Value Ref Range Status   Enterococcus species NOT DETECTED NOT DETECTED Final   Listeria monocytogenes NOT DETECTED NOT DETECTED Final   Staphylococcus species NOT DETECTED NOT DETECTED Final   Staphylococcus aureus NOT DETECTED NOT DETECTED Final   Streptococcus species DETECTED (A) NOT DETECTED Final    Comment: Not Enterococcus species, Streptococcus agalactiae, Streptococcus pyogenes, or Streptococcus pneumoniae. CRITICAL RESULT CALLED TO, READ BACK BY AND VERIFIED WITH: AMASTERS,PHARMD @2300  08/22/17 BY LHOWARD    Streptococcus agalactiae NOT DETECTED NOT DETECTED Final   Streptococcus pneumoniae NOT DETECTED NOT DETECTED  Final   Streptococcus pyogenes NOT DETECTED NOT DETECTED Final   Acinetobacter baumannii NOT DETECTED NOT DETECTED Final   Enterobacteriaceae species NOT DETECTED NOT DETECTED Final   Enterobacter cloacae complex NOT DETECTED NOT DETECTED Final   Escherichia coli NOT DETECTED NOT DETECTED Final   Klebsiella oxytoca NOT DETECTED NOT DETECTED Final   Klebsiella pneumoniae NOT DETECTED NOT DETECTED Final   Proteus species NOT DETECTED NOT DETECTED Final   Serratia marcescens NOT DETECTED NOT DETECTED Final   Haemophilus influenzae NOT DETECTED NOT DETECTED Final   Neisseria meningitidis NOT DETECTED NOT DETECTED Final   Pseudomonas aeruginosa NOT DETECTED NOT DETECTED Final   Candida albicans NOT DETECTED NOT DETECTED Final   Candida glabrata NOT DETECTED NOT DETECTED Final   Candida krusei NOT DETECTED NOT DETECTED Final   Candida parapsilosis NOT DETECTED NOT DETECTED Final   Candida tropicalis NOT DETECTED NOT DETECTED Final  Blood Culture (routine x 2)     Status: None (Preliminary result)   Collection Time: 08/21/17  5:16 AM  Result Value Ref Range Status   Specimen Description BLOOD WRIST RIGHT  Final   Special Requests IN PEDIATRIC BOTTLE Blood Culture adequate volume  Final   Culture NO GROWTH 2 DAYS  Final   Report Status PENDING  Incomplete  Surgical pcr screen     Status: Abnormal   Collection Time: 08/22/17 11:07 AM  Result Value Ref Range Status   MRSA, PCR NEGATIVE NEGATIVE Final   Staphylococcus aureus POSITIVE (A) NEGATIVE Final    Comment: (NOTE) The Xpert SA Assay (FDA approved for NASAL specimens in patients 46 years of age and older), is one component of a comprehensive surveillance program. It is not intended to diagnose infection nor to guide or monitor treatment.     Michel Bickers, MD [facility] [phone] pager   [phone] cell 08/23/2017, 5:01 PM

## 2017-08-24 ENCOUNTER — Encounter (HOSPITAL_COMMUNITY): Payer: Self-pay | Admitting: Orthopedic Surgery

## 2017-08-24 DIAGNOSIS — M5430 Sciatica, unspecified side: Secondary | ICD-10-CM

## 2017-08-24 LAB — IRON AND TIBC
IRON: 69 ug/dL (ref 45–182)
Saturation Ratios: 34 % (ref 17.9–39.5)
TIBC: 204 ug/dL — ABNORMAL LOW (ref 250–450)
UIBC: 135 ug/dL

## 2017-08-24 LAB — BASIC METABOLIC PANEL
ANION GAP: 10 (ref 5–15)
BUN: 21 mg/dL — AB (ref 6–20)
CHLORIDE: 99 mmol/L — AB (ref 101–111)
CO2: 21 mmol/L — ABNORMAL LOW (ref 22–32)
Calcium: 8.9 mg/dL (ref 8.9–10.3)
Creatinine, Ser: 0.77 mg/dL (ref 0.61–1.24)
GFR calc Af Amer: >60 mL/min (ref >60)
GLUCOSE: 170 mg/dL — AB (ref 65–99)
POTASSIUM: 4.5 mmol/L (ref 3.5–5.1)
Sodium: 130 mmol/L — ABNORMAL LOW (ref 135–145)

## 2017-08-24 LAB — GLUCOSE, CAPILLARY
GLUCOSE-CAPILLARY: 140 mg/dL — AB (ref 65–99)
Glucose-Capillary: 183 mg/dL — ABNORMAL HIGH (ref 65–99)
Glucose-Capillary: 212 mg/dL — ABNORMAL HIGH (ref 65–99)
Glucose-Capillary: 211 mg/dL — ABNORMAL HIGH (ref 65–99)

## 2017-08-24 MED ORDER — VANCOMYCIN HCL IN DEXTROSE 1-5 GM/200ML-% IV SOLN
1000.0000 mg | Freq: Three times a day (TID) | INTRAVENOUS | Status: DC
  Administered 2017-08-24 – 2017-08-25 (×3): 1000 mg via INTRAVENOUS
  Filled 2017-08-24 (×4): qty 200

## 2017-08-24 MED ORDER — OXYCODONE HCL 5 MG PO TABS
15.0000 mg | ORAL_TABLET | ORAL | Status: DC
  Administered 2017-08-24 – 2017-08-26 (×13): 15 mg via ORAL
  Filled 2017-08-24 (×13): qty 3

## 2017-08-24 MED ORDER — RAMELTEON 8 MG PO TABS
8.0000 mg | ORAL_TABLET | Freq: Every day | ORAL | Status: DC
  Administered 2017-08-24 – 2017-08-25 (×2): 8 mg via ORAL
  Filled 2017-08-24 (×3): qty 1

## 2017-08-24 MED ORDER — INSULIN ASPART 100 UNIT/ML ~~LOC~~ SOLN
8.0000 [IU] | Freq: Three times a day (TID) | SUBCUTANEOUS | Status: DC
  Administered 2017-08-24 – 2017-08-25 (×2): 8 [IU] via SUBCUTANEOUS

## 2017-08-24 MED ORDER — VANCOMYCIN HCL IN DEXTROSE 750-5 MG/150ML-% IV SOLN
750.0000 mg | Freq: Three times a day (TID) | INTRAVENOUS | Status: DC
  Filled 2017-08-24: qty 150

## 2017-08-24 MED ORDER — TRAZODONE HCL 50 MG PO TABS
50.0000 mg | ORAL_TABLET | Freq: Every evening | ORAL | Status: DC | PRN
  Administered 2017-08-24 – 2017-08-26 (×2): 50 mg via ORAL
  Filled 2017-08-24 (×2): qty 1

## 2017-08-24 MED ORDER — OXYCODONE HCL 5 MG PO TABS
15.0000 mg | ORAL_TABLET | ORAL | Status: DC | PRN
  Administered 2017-08-24: 15 mg via ORAL
  Filled 2017-08-24: qty 3

## 2017-08-24 NOTE — Evaluation (Signed)
Physical Therapy Re-Evaluation Patient Details Name: Arthur Burke MRN: [id number] DOB: Dec 26, 1965 Today's Date: 08/24/2017   History of Present Illness  52 y.o. male admitted on 08/21/17 for Infected L heel ulcer.  Dx with osteomyelitis of left calcaneal tuberosity and intraosseous abscess with surrounding cellulitis.  He is scheduled to undergo a L BKA on 08/23/17.  Pt with other significant PMH of DM1, kidney transplant, diabetic retinopathy and neuropathy, MRSA, HTN, and multiple left foot amputations.  Now s/p L BKA  Clinical Impression   Patient is s/p above surgery resulting in functional limitations due to the deficits listed below (see PT Problem List). Presents with decr functional mobility and decr activity tolerance, as well as impaired balance; very motivated to obtain and use prosthesis; Continue to recommend comprehensive inpatient rehab (CIR) for post-acute therapy needs; Will place rehab screen;  Patient will benefit from skilled PT to increase their independence and safety with mobility to allow discharge to the venue listed below.       Follow Up Recommendations CIR    Equipment Recommendations  Wheelchair (measurements PT);Wheelchair cushion (measurements PT);Other (comment)(16x16 with amputee leg attachment L)    Recommendations for Other Services Rehab consult     Precautions / Restrictions Precautions Precautions: Fall Restrictions Weight Bearing Restrictions: No      Mobility  Bed Mobility Overal bed mobility: Modified Independent             General bed mobility comments: Slow moving, HOB elevated  Transfers Overall transfer level: Needs assistance Equipment used: Rolling walker (2 wheeled) Transfers: Sit to/from Stand Sit to Stand: Min assist         General transfer comment: Min assist to steady, especially during transition of hands form bed to RW  Ambulation/Gait Ambulation/Gait assistance: Min guard Ambulation Distance (Feet): 4  Feet(including pivot steps bed to chair) Assistive device: Rolling walker (2 wheeled) Gait Pattern/deviations: (small hop-steps)     General Gait Details: Cues for technique and for full support on RW with bil UEs; Reports feeling like his balance is off after amputation  Stairs            Wheelchair Mobility    Modified Rankin (Stroke Patients Only)       Balance     Sitting balance-Leahy Scale: Good       Standing balance-Leahy Scale: Poor                               Pertinent Vitals/Pain Pain Assessment: 0-10 Pain Score: 9 ("and a half") Faces Pain Scale: Hurts even more Pain Location: L LE/knee Pain Descriptors / Indicators: Grimacing;Guarding Pain Intervention(s): Monitored during session;Premedicated before session;Patient requesting pain meds-RN notified    Home Living Family/patient expects to be discharged to:: Private residence Living Arrangements: Parent(mother) Available Help at Discharge: Family;Available 24 hours/day Type of Home: House Home Access: Level entry     Home Layout: One level Home Equipment: Walker - 2 wheels;Walker - 4 wheels;Cane - single point;Shower seat      Prior Function Level of Independence: Independent with assistive device(s)         Comments: cane vs RW     Hand Dominance   Dominant Hand: Right    Extremity/Trunk Assessment   Upper Extremity Assessment Upper Extremity Assessment: Overall WFL for tasks assessed    Lower Extremity Assessment Lower Extremity Assessment: LLE deficits/detail LLE Deficits / Details: New L BKA.  He is very sensative  to touch in his lower legs bil due to peripherial neuropathy.  Knee and hip ROM are WNL and strength is at least 3/5 per gross functional assessment.  Short hamstrings LLE: Unable to fully assess due to pain LLE Sensation: history of peripheral neuropathy    Cervical / Trunk Assessment Cervical / Trunk Assessment: Other exceptions Cervical / Trunk  Exceptions: chronic low back pain reported  Communication   Communication: No difficulties  Cognition Arousal/Alertness: Awake/alert Behavior During Therapy: WFL for tasks assessed/performed Overall Cognitive Status: Within Functional Limits for tasks assessed                                        General Comments General comments (skin integrity, edema, etc.): Continuing education re: the importance of knee extension and hamstring length in prep for eventual prosthesis    Exercises  Performed L seated hamstring stretch Performed standing RW push ups for bil UE strengthening x5   Assessment/Plan    PT Assessment Patient needs continued PT services  PT Problem List Decreased strength;Decreased activity tolerance;Decreased mobility;Decreased balance;Decreased knowledge of use of DME;Decreased knowledge of precautions;Impaired sensation;Pain;Decreased skin integrity       PT Treatment Interventions DME instruction;Gait training;Functional mobility training;Therapeutic activities;Therapeutic exercise;Balance training;Patient/family education;Wheelchair mobility training    PT Goals (Current goals can be found in the Care Plan section)  Acute Rehab PT Goals Patient Stated Goal: to get a prosthetic leg PT Goal Formulation: With patient Time For Goal Achievement: 08/29/17 Potential to Achieve Goals: Good    Frequency Min 3X/week   Barriers to discharge        Co-evaluation               AM-PAC PT "6 Clicks" Daily Activity  Outcome Measure Difficulty turning over in bed (including adjusting bedclothes, sheets and blankets)?: A Little Difficulty moving from lying on back to sitting on the side of the bed? : A Little Difficulty sitting down on and standing up from a chair with arms (e.g., wheelchair, bedside commode, etc,.)?: A Little Help needed moving to and from a bed to chair (including a wheelchair)?: A Little Help needed walking in hospital room?: A  Little Help needed climbing 3-5 steps with a railing? : A Lot 6 Click Score: 17    End of Session Equipment Utilized During Treatment: Gait belt Activity Tolerance: Patient tolerated treatment well;Patient limited by pain Patient left: in chair;with call bell/phone within reach;with chair alarm set Nurse Communication: Mobility status PT Visit Diagnosis: Difficulty in walking, not elsewhere classified (R26.2);Pain;Muscle weakness (generalized) (M62.81) Pain - Right/Left: Left Pain - part of body: Leg    Time: 0800-0821 PT Time Calculation (min) (ACUTE ONLY): 21 min   Charges:   PT Evaluation $PT Re-evaluation: 1 Re-eval     PT G Codes:        Roney Marion, PT  Acute Rehabilitation Services Pager [phone] Office [phone]   [place] 08/24/2017, 9:20 AM

## 2017-08-24 NOTE — Progress Notes (Signed)
   Subjective: Arthur Burke says he is feeling okay except for uncontrolled back pain from sciatica today. Feels that the site of BKA incision is healing well. Denies chills, nausea, feeling unwell. We discussed making his pain medications scheduled to better reflect how he takes medications at home. Oxycodone IR dose had been decreased yesterday, this will be corrected to reflect home dose and scheduled today.   Objective:  Vital signs in last 24 hours: Vitals:   08/23/17 0949 08/23/17 0951 08/23/17 1657 08/24/17 0420  BP: (!) 148/70  127/61 (!) 149/68  Pulse: 65  65 63  Resp: 16  18 17   Temp: 97.6 F (36.4 C)  97.7 F (36.5 C) 97.8 F (36.6 C)  TempSrc: Oral  Oral Oral  SpO2: 99% 99% 97% 100%  Weight:      Height:       General: well appearing, no acute distress, sitting up in a chair beside his bed  Cardiac: RRR, no murmur appreciated, no peripheral edema  Lungs: clear to auscultation bilateral, no respiratory distress  MSK: wound vac in place, minimal drainage   Assessment/Plan:    Acute osteomyelitis of left calcaneus (HCC)   Lower limb ulcer, ankle, left, with necrosis of muscle (HCC)   S/P BKA (below knee amputation) unilateral, left (HCC) - amputation incision site healing well, vac is with minimal drainage, pain is controlled at this time, - [employer] arranging for stump shrinker  - PT has evaluated, they are recommending CIR and wheelchair  - CM consulted and following, consult placed to rehab physician today in epic     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 ([place])   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 ([place]) - blood glucose rising 300-400s yesterday, he is on chronic prednisone - Cefepime given in D50 was discontinued yesterday  - lantus increased to 30 units yesterday, an additional dose of lantus had been ordered earlier in the day but was cancelled, he did receive 30 units yesterday evening  - start novolog 8 units TID  qAC, continue additional SSI- M      Streptococcal bacteremia - Blood cultures 1/10  with strep species in 1/4 bottles - afebrile today without systemic signs/ symptoms of infection  - ID consulted, appreciate their recommendations and care for this patient  - continue vancomycin 1/10 >>  - discontinued cefepime yesterday 1/11 > 1/12  Normocytic anemia  - No reports of bleeding  - F/u iron panel, cont trend hgb tomorrow     Renal transplant recipient - continue home immunosupresive meds - tacrolimus, prednisone, and mycophenolate   Dispo: Anticipated discharge in approximately 2-3 day(s).   Ledell Noss, MD 08/24/2017, 6:52 AM Pager: [phone]

## 2017-08-24 NOTE — Progress Notes (Signed)
  Date: 08/24/2017  Patient name: Arthur Burke  Medical record number: [id number]  Date of birth: [birth year]   I have seen and evaluated this patient and I have discussed the plan of care with the house staff. Please see their note for complete details. I concur with their findings with the following additions/corrections:   Doing well after left BKA yesterday for osteomyelitis with abscess of the left calcaneus. However, reports pain control has not been adequate, we will schedule his home oxycodone 15 mg every 4 hours and provide breakthrough pain control on top of that.  Oda Kilts, MD 08/24/2017, 2:39 PM

## 2017-08-24 NOTE — Progress Notes (Addendum)
Pt reports that he does not feel that his pain medication is sufficient because it is not his home dose and his pain is increasing.  Page to [phone] for notification. New order noted to increase oxycodone to 15 mg po. Dorthey Sawyer, RN

## 2017-08-24 NOTE — Progress Notes (Addendum)
PHARMACY NOTE:  ANTIMICROBIAL RENAL DOSAGE ADJUSTMENT  Current antimicrobial regimen includes a mismatch between antimicrobial dosage and estimated renal function.  As per policy approved by the Pharmacy & Therapeutics and Medical Executive Committees, the antimicrobial dosage will be adjusted accordingly.  Current antimicrobial dosage: Vancomycin 1000 mg IV q12h  Indication: Osteomyelitis   Renal Function:  Estimated Creatinine Clearance: 117.7 mL/min (by C-G formula based on SCr of 0.77 mg/dL). []      On intermittent HD, scheduled: []      On CRRT    Antimicrobial dosage has been changed to:  Vancomycin 1000 mg IV q8h  Additional comments:  Arthur Burke is a 52 y.o. male admitted on 08/21/2017 with fever, chills and N/V. Pt is s/p kidney transplant in 2005 and is on mycophenolate, tacrolimus, prednisone.  Pharmacy consulted for vancomycin for osteomyelitis. 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  WBC trending down, tmax/24h 98.1. Renal function is trending down and most recent UOP is 1.2.  Plan: -Change vancomycin to 1000 mg IV q8h for improved renal function -Monitor clinic progress, renal function, electrolytes - F/u BCx, C/S, vancomycin trough levels, future de-escalation, & length of therapy post-op   Height: 5\' 11"  (180.3 cm) Weight: 177 lb 11.1 oz (80.6 kg) IBW/kg (Calculated) : 75.3  Temp (24hrs), Avg:97.6 F (36.4 C), Min:97 F (36.1 C), Max:98.1 F (36.7 C)  Recent Labs  Lab 08/21/17 0457 08/21/17 0521 08/22/17 0452 08/23/17 0548 08/24/17 0735  WBC 18.2*  --  22.0* 15.8*  --   CREATININE 1.26*  --  1.29* 1.07 0.77  LATICACIDVEN  --  1.34  --   --   --     Estimated Creatinine Clearance: 88 mL/min (by C-G formula based on SCr of 1.07 mg/dL).    Allergies  Allergen Reactions  . Zolpidem Other (See Comments)    Other Reaction: CNS Disorder    Antimicrobials this admission: 1/10 vancomycin > 1/10 zosyn >1/10 1/11 cefepime> 1/12   Microbiology  results: 1/10 blood cx: 1/10 wound cx: abundant GNR and GPC, rare GPR  Nida Boatman, PharmD PGY1 Acute Care Pharmacy Resident Pager: [phone] 08/24/2017 8:24 AM

## 2017-08-25 DIAGNOSIS — G8918 Other acute postprocedural pain: Secondary | ICD-10-CM

## 2017-08-25 DIAGNOSIS — D649 Anemia, unspecified: Secondary | ICD-10-CM

## 2017-08-25 DIAGNOSIS — N189 Chronic kidney disease, unspecified: Secondary | ICD-10-CM

## 2017-08-25 DIAGNOSIS — B954 Other streptococcus as the cause of diseases classified elsewhere: Secondary | ICD-10-CM

## 2017-08-25 DIAGNOSIS — S88112D Complete traumatic amputation at level between knee and ankle, left lower leg, subsequent encounter: Secondary | ICD-10-CM

## 2017-08-25 DIAGNOSIS — Z9483 Pancreas transplant status: Secondary | ICD-10-CM

## 2017-08-25 DIAGNOSIS — M86272 Subacute osteomyelitis, left ankle and foot: Secondary | ICD-10-CM

## 2017-08-25 DIAGNOSIS — Z79891 Long term (current) use of opiate analgesic: Secondary | ICD-10-CM

## 2017-08-25 DIAGNOSIS — N183 Chronic kidney disease, stage 3 unspecified: Secondary | ICD-10-CM

## 2017-08-25 DIAGNOSIS — D62 Acute posthemorrhagic anemia: Secondary | ICD-10-CM

## 2017-08-25 DIAGNOSIS — E1049 Type 1 diabetes mellitus with other diabetic neurological complication: Secondary | ICD-10-CM

## 2017-08-25 DIAGNOSIS — I10 Essential (primary) hypertension: Secondary | ICD-10-CM

## 2017-08-25 DIAGNOSIS — D631 Anemia in chronic kidney disease: Secondary | ICD-10-CM

## 2017-08-25 DIAGNOSIS — E871 Hypo-osmolality and hyponatremia: Secondary | ICD-10-CM

## 2017-08-25 DIAGNOSIS — E10622 Type 1 diabetes mellitus with other skin ulcer: Secondary | ICD-10-CM

## 2017-08-25 LAB — CBC
HEMATOCRIT: 32.4 % — AB (ref 39.0–52.0)
HEMOGLOBIN: 10.6 g/dL — AB (ref 13.0–17.0)
MCH: 27.7 pg (ref 26.0–34.0)
MCHC: 32.7 g/dL (ref 30.0–36.0)
MCV: 84.8 fL (ref 78.0–100.0)
Platelets: 323 10*3/uL (ref 150–400)
RBC: 3.82 MIL/uL — ABNORMAL LOW (ref 4.22–5.81)
RDW: 13.2 % (ref 11.5–15.5)
WBC: 12.7 10*3/uL — ABNORMAL HIGH (ref 4.0–10.5)

## 2017-08-25 LAB — BASIC METABOLIC PANEL
Anion gap: 10 (ref 5–15)
BUN: 22 mg/dL — ABNORMAL HIGH (ref 6–20)
CO2: 24 mmol/L (ref 22–32)
Calcium: 9.1 mg/dL (ref 8.9–10.3)
Chloride: 97 mmol/L — ABNORMAL LOW (ref 101–111)
Creatinine, Ser: 0.77 mg/dL (ref 0.61–1.24)
GFR calc Af Amer: >60 mL/min (ref >60)
GFR calc non Af Amer: >60 mL/min (ref >60)
Glucose, Bld: 136 mg/dL — ABNORMAL HIGH (ref 65–99)
Potassium: 4.5 mmol/L (ref 3.5–5.1)
Sodium: 131 mmol/L — ABNORMAL LOW (ref 135–145)

## 2017-08-25 LAB — GLUCOSE, CAPILLARY
GLUCOSE-CAPILLARY: 49 mg/dL — AB (ref 65–99)
GLUCOSE-CAPILLARY: 89 mg/dL (ref 65–99)
Glucose-Capillary: 128 mg/dL — ABNORMAL HIGH (ref 65–99)
Glucose-Capillary: 164 mg/dL — ABNORMAL HIGH (ref 65–99)
Glucose-Capillary: 40 mg/dL — CL (ref 65–99)
Glucose-Capillary: 56 mg/dL — ABNORMAL LOW (ref 65–99)
Glucose-Capillary: 292 mg/dL — ABNORMAL HIGH (ref 65–99)

## 2017-08-25 LAB — CULTURE, BLOOD (ROUTINE X 2): Special Requests: ADEQUATE

## 2017-08-25 MED ORDER — INSULIN ASPART 100 UNIT/ML ~~LOC~~ SOLN
10.0000 [IU] | Freq: Three times a day (TID) | SUBCUTANEOUS | Status: DC
  Administered 2017-08-25 – 2017-08-26 (×3): 10 [IU] via SUBCUTANEOUS

## 2017-08-25 MED ORDER — INSULIN GLARGINE 100 UNIT/ML ~~LOC~~ SOLN
32.0000 [IU] | Freq: Every day | SUBCUTANEOUS | Status: DC
  Administered 2017-08-25: 32 [IU] via SUBCUTANEOUS
  Filled 2017-08-25 (×2): qty 0.32

## 2017-08-25 NOTE — Progress Notes (Signed)
PT Cancellation Note  Patient Details Name: Arthur Burke MRN: [id number] DOB: November 29, 1965   Cancelled Treatment:    Reason Eval/Treat Not Completed: Pain limiting ability to participate, patient recently back to bed after having been up for a while. Patient also with hypoglycemic event and states right now he is just hurting. Spoke with patient regarding positioning in bed for residual limb as well as techniques for desensitization. Patient receptive. Will try back next day.   Duncan Dull 08/25/2017, 4:41 PM Alben Deeds, PT DPT  Board Certified Neurologic Specialist [phone]

## 2017-08-25 NOTE — Progress Notes (Signed)
Patient ID: Arthur Burke, male   DOB: [birth year], 52 y.o.   MRN: [id number]          [facility] for Infectious Disease    Date of Admission:  08/21/2017   Day 6 vancomycin         [name] is doing well and recovering nicely after recent BKA for diabetic foot infection.  He had transient viridans streptococcal bacteremia.  His isolate is sensitive to vancomycin.  I suspect that he has had enough treatment at this point and I will stop vancomycin now.        Michel Bickers, MD [facility] [phone] pager   [phone] cell 08/25/2017, 12:03 PM

## 2017-08-25 NOTE — Consult Note (Signed)
Physical Medicine and Rehabilitation Consult Reason for Consult: Decreased functional mobility Referring Physician: [facility]   HPI: Arthur Burke is a 52 y.o. 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 Per chart review and patient, patient lives with mother. Independent prior to admission using a walker. One level home. His mother uses a walker for balance and still drives and she can assist but limited physically. Presented 08/21/2017 with gangrenous changes of the left foot, fever 100.9, WBC 18,000. MRI shows recurrent osteomyelitis extensive necrosis. 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 Sharol Given. Hospital course pain management. Hyponatremia 129-132. Remains on contact precautions for MRSA. Physical therapy evaluation completed 08/24/2017 with recommendations of physical medicine rehabilitation consult.   Review of Systems  HENT: Negative for hearing loss.   Eyes: Negative for blurred vision and double vision.  Respiratory: Positive for shortness of breath. Negative for cough.   Cardiovascular: Positive for leg swelling. Negative for chest pain and palpitations.  Gastrointestinal: Negative for nausea.  Genitourinary: Negative for dysuria, flank pain and hematuria.  Musculoskeletal: Positive for joint pain and myalgias.  Skin: Negative for rash.  Neurological: Negative for seizures.  All other systems reviewed and are negative.  Past Medical History:  Diagnosis Date  . Diabetes mellitus without complication ([place])   . Diabetic foot infection ([place])   . Hypertension   . MRSA (methicillin resistant staph aureus) culture positive   . Renal disorder   . Vision impairment    Past Surgical History:  Procedure Laterality Date  .  AMPUTATION Left 08/23/2017   Procedure: AMPUTATION BELOW KNEE;  Surgeon: Newt Minion, MD;  Location: [place];  Service: Orthopedics;  Laterality: Left;  . APPLICATION OF WOUND VAC Left 08/23/2017   Procedure: APPLICATION OF WOUND VAC;  Surgeon: Newt Minion, MD;  Location: [place];  Service: Orthopedics;  Laterality: Left;  . KIDNEY TRANSPLANT  2006   [facility]  . SKIN GRAFT     L diabetic foot wound with debriedment and skin grafting  . TOE AMPUTATION     R great and 2nd toe   No pertinent family history of amputation. Social History:  reports that  has never smoked. he has never used smokeless tobacco. He reports that he does not drink alcohol. His drug history is not on file. Allergies:  Allergies  Allergen Reactions  . Zolpidem Other (See Comments)    Other Reaction: CNS Disorder   Medications Prior to Admission  Medication Sig Dispense Refill  . amLODipine (NORVASC) 10 MG tablet Take 10 mg by mouth daily.    Marland Kitchen atorvastatin (LIPITOR) 40 MG tablet Take 40 mg by mouth every evening.    . Calcium 600-200 MG-UNIT tablet Take 1 tablet by mouth daily.    . Calcium Ascorbate 500 MG TABS Take 500 mg by mouth daily.    . carvedilol (COREG) 25 MG tablet Take 25 mg by mouth 2 (two) times daily.    . cloNIDine (CATAPRES) 0.1 MG tablet Take 0.1 mg by mouth 2 (two) times daily.    . Insulin Glargine (LANTUS SOLOSTAR) 100 UNIT/ML Solostar Pen Inject 32 Units into the skin at bedtime.    . insulin lispro (HUMALOG KWIKPEN) 100 UNIT/ML KiwkPen Inject 11-15 Units into the skin 3 (three) times daily with meals. Sliding  scale    . Multiple Vitamins-Minerals (ZINC PO) Take 1 tablet by mouth every Monday, Wednesday, and Friday.    . mycophenolate (CELLCEPT) 500 MG tablet Take 500 mg by mouth 2 (two) times daily.    Marland Kitchen omeprazole (PRILOSEC) 20 MG capsule Take 20 mg by mouth 2 (two) times daily.    Marland Kitchen oxyCODONE (ROXICODONE) 15 MG immediate release tablet Take 15 mg by mouth every 4 (four) hours.    . predniSONE  (DELTASONE) 5 MG tablet Take 5 mg by mouth daily.    . ramipril (ALTACE) 10 MG capsule Take 20 mg by mouth daily.    . tacrolimus (PROGRAF) 1 MG capsule Take 1 mg by mouth 2 (two) times daily.    . vitamin B-12 (CYANOCOBALAMIN) 500 MCG tablet Take 500 mcg by mouth daily.      Home: Home Living Family/patient expects to be discharged to:: Private residence Living Arrangements: Parent(mother) Available Help at Discharge: Family, Available 24 hours/day Type of Home: House Home Access: Level entry [place]: One level Bathroom Shower/Tub: Chiropodist: Standard Bathroom Accessibility: Yes(with walker sideways) Home Equipment: Environmental consultant - 2 wheels, Environmental consultant - 4 wheels, Cane - single point, Careers adviser History: Prior Function Level of Independence: Independent with assistive device(s) Comments: cane vs RW Functional Status:  Mobility: Bed Mobility Overal bed mobility: Modified Independent General bed mobility comments: Slow moving, HOB elevated Transfers Overall transfer level: Needs assistance Equipment used: Rolling walker (2 wheeled) Transfers: Sit to/from Stand Sit to Stand: Min assist General transfer comment: Min assist to steady, especially during transition of hands form bed to RW Ambulation/Gait Ambulation/Gait assistance: Min guard Ambulation Distance (Feet): 4 Feet(including pivot steps bed to chair) Assistive device: Rolling walker (2 wheeled) Gait Pattern/deviations: (small hop-steps) General Gait Details: Cues for technique and for full support on RW with bil UEs; Reports feeling like his balance is off after amputation    ADL:    Cognition: Cognition Overall Cognitive Status: Within Functional Limits for tasks assessed Orientation Level: Oriented X4 Cognition Arousal/Alertness: Awake/alert Behavior During Therapy: WFL for tasks assessed/performed Overall Cognitive Status: Within Functional Limits for tasks assessed  Blood  pressure 114/66, pulse 62, temperature 98.3 F (36.8 C), temperature source Oral, resp. rate 18, height 5\' 11"  (1.803 m), weight 80.6 kg (177 lb 11.1 oz), SpO2 98 %. Physical Exam  Vitals reviewed. Constitutional: He is oriented to person, place, and time. He appears well-developed and well-nourished.  HENT:  Head: Normocephalic and atraumatic.  Eyes: EOM are normal. Right eye exhibits no discharge. Left eye exhibits no discharge.  Neck: Normal range of motion. Neck supple. No thyromegaly present.  Cardiovascular: Normal rate, regular rhythm and normal heart sounds.  Respiratory: Effort normal and breath sounds normal. No respiratory distress.  GI: Soft. Bowel sounds are normal. He exhibits no distension.  Musculoskeletal: He exhibits tenderness (Left BKA). He exhibits no edema.  Neurological: He is alert and oriented to person, place, and time.  Motor: B/l UE 5/5 proximal to distal RLE: HF: 4+/5, distally 5/5 LLE: HF 4/5 (pain inhibition)  Skin:  Left BKA amputation site is dressed with wound VAC in place. Appropriately tender  Psychiatric: He has a normal mood and affect. His behavior is normal.    Results for orders placed or performed during the hospital encounter of 08/21/17 (from the past 24 hour(s))  Iron and TIBC     Status: Abnormal   Collection Time: 08/24/17  7:35 AM  Result Value Ref Range  Iron 69 45 - 182 ug/dL   TIBC 204 (L) 250 - 450 ug/dL   Saturation Ratios 34 17.9 - 39.5 %   UIBC 135 ug/dL  Basic metabolic panel     Status: Abnormal   Collection Time: 08/24/17  7:35 AM  Result Value Ref Range   Sodium 130 (L) 135 - 145 mmol/L   Potassium 4.5 3.5 - 5.1 mmol/L   Chloride 99 (L) 101 - 111 mmol/L   CO2 21 (L) 22 - 32 mmol/L   Glucose, Bld 170 (H) 65 - 99 mg/dL   BUN 21 (H) 6 - 20 mg/dL   Creatinine, Ser 0.77 0.61 - 1.24 mg/dL   Calcium 8.9 8.9 - 10.3 mg/dL   GFR calc non Af Amer >60 >60 mL/min   GFR calc Af Amer >60 >60 mL/min   Anion gap 10 5 - 15    Glucose, capillary     Status: Abnormal   Collection Time: 08/24/17  7:36 AM  Result Value Ref Range   Glucose-Capillary 183 (H) 65 - 99 mg/dL  Glucose, capillary     Status: Abnormal   Collection Time: 08/24/17 11:44 AM  Result Value Ref Range   Glucose-Capillary 140 (H) 65 - 99 mg/dL  Glucose, capillary     Status: Abnormal   Collection Time: 08/24/17  5:09 PM  Result Value Ref Range   Glucose-Capillary 212 (H) 65 - 99 mg/dL  Glucose, capillary     Status: Abnormal   Collection Time: 08/24/17  9:41 PM  Result Value Ref Range   Glucose-Capillary 211 (H) 65 - 99 mg/dL   No results found.  Assessment/Plan: Diagnosis: Left BKA Labs and images independently reviewed.  Records reviewed and summated above. Clean amputation daily with soap and water Monitor incision site for signs of infection or impending skin breakdown. Staples to remain in place for 3-4 weeks Stump shrinker, for edema control  Scar mobilization massaging to prevent soft tissue adherence Stump protector during therapies Prevent flexion contractures by implementing the following:   Encourage prone lying for 20-30 mins per day BID to avoid hip flexion  Contractures if medically appropriate;  Avoid pillow under knees when patient is lying in bed in order to prevent both  knee and hip flexion contractures;  Avoid prolonged sitting Post surgical pain control with oral medication Phantom limb pain control with physical modalities including desensitization techniques (gentle self massage to the residual stump,hot packs if sensation intact, [place]) and mirror therapy, TENS. If ineffective, consider pharmacological treatment for neuropathic pain (e.g gabapentin, pregabalin, amytriptalyine, duloxetine).  When using wheelchair, patient should have knee on amputated side fully extended with board under the seat cushion. Avoid injury to contralateral side  1. Does the need for close, 24 hr/day medical supervision in concert with  the patient's rehab needs make it unreasonable for this patient to be served in a less intensive setting? Potentially  2. Co-Morbidities requiring supervision/potential complications: type 1 diabetes mellitus (Monitor in accordance with exercise and adjust meds as necessary), CKD stage III with history of renal transplant maintained on immunosuppressive therapy (avoid nephrotoxic meds), post-op pain management (Biofeedback training with therapies to help reduce reliance on opiate pain medications, particularly IV dilaudid, monitor pain control during therapies, and sedation at rest and titrate to maximum efficacy to ensure participation and gains in therapies), hyponatremia (cont to monitor, treat if necessary), HTN (monitor and provide prns in accordance with increased physical exertion and pain), d/c IV Vanc when appropriate, leukocytosis (cont to monitor  for signs and symptoms of infection, further workup if indicated), ABLA (transfuse if necessary to ensure appropriate perfusion for increased activity tolerance) 3. Due to bowel management, safety, skin/wound care, disease management, pain management and patient education, does the patient require 24 hr/day rehab nursing? Yes 4. Does the patient require coordinated care of a physician, rehab nurse, PT (1-2 hrs/day, 5 days/week) and OT (1-2 hrs/day, 5 days/week) to address physical and functional deficits in the context of the above medical diagnosis(es)? Potentially Addressing deficits in the following areas: balance, endurance, locomotion, strength, transferring, bathing, dressing, toileting and psychosocial support 5. Can the patient actively participate in an intensive therapy program of at least 3 hrs of therapy per day at least 5 days per week? Yes 6. The potential for patient to make measurable gains while on inpatient rehab is good 7. Anticipated functional outcomes upon discharge from inpatient rehab are modified independent  with PT, modified  independent with OT, n/a with SLP. 8. Estimated rehab length of stay to reach the above functional goals is: 5-7 days. 9. Anticipated D/C setting: Home 10. Anticipated post D/C treatments: HH therapy and Home excercise program 11. Overall Rehab/Functional Prognosis: good  RECOMMENDATIONS: This patient's condition is appropriate for continued rehabilitative care in the following setting: Recommend follow up therapies to determine progress once pain better controlled.  Pt may not require CIR. If so, recommend PM&R outpt follow up. Patient has agreed to participate in recommended program. Potentially Note that insurance prior authorization may be required for reimbursement for recommended care.  Comment: Rehab Admissions Coordinator to follow up.  Delice Lesch, MD, ABPMR Lavon Paganini Angiulli, PA-C 08/25/2017

## 2017-08-25 NOTE — Evaluation (Signed)
Occupational Therapy Evaluation Patient Details Name: Arthur Burke MRN: [id number] DOB: [birth year] Today's Date: 08/25/2017    History of Present Illness 52 y.o. male admitted on 08/21/17 for Infected L heel ulcer.  Dx with osteomyelitis of left calcaneal tuberosity and intraosseous abscess with surrounding cellulitis.  He is scheduled to undergo a L BKA on 08/23/17.  Pt with other significant PMH of DM1, kidney transplant, diabetic retinopathy and neuropathy, MRSA, HTN, and multiple left foot amputations.  Now s/p L BKA   Clinical Impression   This 52 y/o M presents with the above. PTA, Pt is mod independent with ADLs and functional mobility. Pt presenting with decreased dynamic standing balance, pain, and decreased functional performance. Pt completed stand pivot transfers with MinA this session, currently requires MinA for LB ADLs. Pt motivated and willing to work/progress with therapy. Pt will benefit from continued acute OT services and recommend CIR level therapies after discharge to maximize his safety and independence with ADLs and mobility prior to return home.     Follow Up Recommendations  CIR    Equipment Recommendations  Other (comment)(defer to next venue )           Precautions / Restrictions Precautions Precautions: Fall Restrictions Weight Bearing Restrictions: No      Mobility Bed Mobility Overal bed mobility: Modified Independent             General bed mobility comments: increased time to complete, HOB elevated  Transfers Overall transfer level: Needs assistance Equipment used: Rolling walker (2 wheeled) Transfers: Sit to/from [facility] Sit to Stand: Min assist Stand pivot transfers: Min assist       General transfer comment: assist to rise and steady; verbal cues for safe hand placement; completed stand pivot EOB<>BSC     Balance Overall balance assessment: Needs assistance Sitting-balance support: Feet supported;No upper  extremity supported Sitting balance-Leahy Scale: Good     Standing balance support: Bilateral upper extremity supported Standing balance-Leahy Scale: Poor Standing balance comment: reliant on UE support                            ADL either performed or assessed with clinical judgement   ADL Overall ADL's : Needs assistance/impaired Eating/Feeding: Sitting;Modified independent   Grooming: Set up;Sitting   Upper Body Bathing: Min guard;Sitting   Lower Body Bathing: Minimal assistance;Sit to/from stand Lower Body Bathing Details (indicate cue type and reason): discussed option of utilizing lateral leans to complete task vs sit<>stand  Upper Body Dressing : Sitting;Set up   Lower Body Dressing: Minimal assistance;Sit to/from stand Lower Body Dressing Details (indicate cue type and reason): Pt easily able to reach and adjust sock on RLE Toilet Transfer: Minimal assistance;Stand-pivot;BSC;RW   Toileting- Clothing Manipulation and Hygiene: Minimal assistance;Sit to/from stand       Functional mobility during ADLs: Minimal assistance;Rolling walker(for stand pivot ) General ADL Comments: education provided on desensitization techniques                          Pertinent Vitals/Pain Pain Assessment: Faces Faces Pain Scale: Hurts even more Pain Location: L LE/knee Pain Descriptors / Indicators: Grimacing;Guarding Pain Intervention(s): Limited activity within patient's tolerance;Monitored during session     Hand Dominance Right   Extremity/Trunk Assessment Upper Extremity Assessment Upper Extremity Assessment: Overall WFL for tasks assessed   Lower Extremity Assessment Lower Extremity Assessment: Defer to PT evaluation   Cervical /  Trunk Assessment Cervical / Trunk Assessment: Other exceptions Cervical / Trunk Exceptions: chronic low back pain reported   Communication Communication Communication: No difficulties   Cognition Arousal/Alertness:  Awake/alert Behavior During Therapy: WFL for tasks assessed/performed Overall Cognitive Status: Within Functional Limits for tasks assessed                                                      Home Living Family/patient expects to be discharged to:: Private residence Living Arrangements: Parent(mother ) Available Help at Discharge: Family;Available 24 hours/day Type of Home: House Home Access: Level entry     Home Layout: One level     Bathroom Shower/Tub: Teacher, early years/pre: Standard Bathroom Accessibility: Yes(with walker sideways )   Home Equipment: Walker - 2 wheels;Walker - 4 wheels;Cane - single point;Shower seat          Prior Functioning/Environment Level of Independence: Independent with assistive device(s)        Comments: cane vs RW        OT Problem List: Decreased strength;Impaired balance (sitting and/or standing);Pain;Decreased activity tolerance;Decreased knowledge of use of DME or AE      OT Treatment/Interventions: Self-care/ADL training;DME and/or AE instruction;Therapeutic activities;Balance training;Therapeutic exercise;Energy conservation;Patient/family education    OT Goals(Current goals can be found in the care plan section) Acute Rehab OT Goals Patient Stated Goal: to get a prosthetic leg OT Goal Formulation: With patient Time For Goal Achievement: 09/08/17 Potential to Achieve Goals: Good  OT Frequency: Min 2X/week                             AM-PAC PT "6 Clicks" Daily Activity     Outcome Measure Help from another person eating meals?: None Help from another person taking care of personal grooming?: A Little Help from another person toileting, which includes using toliet, bedpan, or urinal?: A Little Help from another person bathing (including washing, rinsing, drying)?: A Little Help from another person to put on and taking off regular upper body clothing?: None Help from another  person to put on and taking off regular lower body clothing?: A Lot 6 Click Score: 19   End of Session Equipment Utilized During Treatment: Gait belt;Rolling walker Nurse Communication: Mobility status  Activity Tolerance: Patient tolerated treatment well Patient left: in bed;with call bell/phone within reach  OT Visit Diagnosis: Other abnormalities of gait and mobility (R26.89);Unsteadiness on feet (R26.81)                Time: 6734-1937 OT Time Calculation (min): 32 min Charges:  OT General Charges $OT Visit: 1 Visit OT Evaluation $OT Eval Moderate Complexity: 1 Mod OT Treatments $Self Care/Home Management : 8-22 mins G-Codes:     Lou Cal, OT Pager [phone] 08/25/2017   Arthur Burke 08/25/2017, 1:04 PM

## 2017-08-25 NOTE — Progress Notes (Signed)
   Subjective:  No acute events overnight. Patient continues to complain of pain. He is currently on his home oxy 15 q4h and IV Dilaudid q2h PRN. Patient under the impression IV Dilaudid was every 4 hours. Discussed with patient he has Dilaudid available every 2 hours as needed for pain. All questions answered.   Objective:  Vital signs in last 24 hours: Vitals:   08/24/17 0743 08/24/17 1708 08/24/17 2146 08/25/17 0434  BP: (!) 163/71 (!) 152/72 (!) 152/67 114/66  Pulse: 67 66 65 62  Resp: 18 18 17 18   Temp: 98.1 F (36.7 C) (!) 97.5 F (36.4 C) 98.1 F (36.7 C) 98.3 F (36.8 C)  TempSrc: Oral Oral Oral Oral  SpO2: 97% 97% 98% 98%  Weight:      Height:       General: pleasant male, eating breakfast this AM, in no acute distress  Cardiac: regular rate and rhythm, nl S1/S2, no murmurs, rubs or gallops  Pulm: CTAB, no wheezes or crackles, no increased work of breathing  Abd: soft, NTND, bowel sounds present Ext: s/p L  BKA, wound vac in place, no signs of infection present, RLE warm and well perfused  Assessment/Plan:  Principal Problem:   Acute osteomyelitis of left calcaneus (HCC) Active Problems:   Lower limb ulcer, ankle, left, with necrosis of muscle (HCC)   Normocytic anemia   Infected ulcer of skin, with fat layer exposed ([place])   Renal transplant recipient   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 Surgical wound healing well. No signs of infection noted on exam. Patient continues to complain of pain, but using IV Dilaudid q4h PRN instead of q2h PRN. PT recommended CIR. However, CIR recommended continuing PT to determine progress once better pain control achieved.  - Continue  scheduled oxy 15 mg q4h + IV Dilaudid 1 mg q2h PRN  - [employer] arranging for stump shrinker  - Continue PT  - CM consulted and following   # Diabetes type 1 complicated by atherosclerosis of arteries of extremities and neuropathy On Lantus 30 QHS. CBG 136 this morning. Mealtime CBG improving, now in low 200s.  - Increase Lantus 30-> 32U QHS  - Novolog 8-> 10 units TID qAC - Continue additional SSI- M    # Streptococcal bacteremia: Blood cultures 1/10  positive for Streptococcus viridians  1/4 bottles sensitive to levo and vancomycin. Repeat Bcx negative at 24h. Will discuss with ID for outpatient antibiotic regimen.  - Remains afebrile without systemic signs/ symptoms of infection  - ID consulted, appreciate their recommendations - Continue vancomycin 1/10 >>  - Discontinued cefepime 1/11 > 1/12  # Normocytic anemia: No active signs/symptoms of bleeding. Hgb 10.6 this AM from 11.6 yesterday. Iron studies normal.  - Continue to monitor    # Renal and pancreas transplant recipient - Continue home immunosupresive meds - tacrolimus, prednisone, and mycophenolate   Dispo: Anticipated discharge in approximately 2-4 day(s) pending pain control optimization and dispo.   Welford Roche, MD 08/25/2017, 7:27 AM Pager: [phone]

## 2017-08-25 NOTE — H&P (Signed)
Physical Medicine and Rehabilitation Admission H&P    Chief Complaint  Patient presents with  . Fever  . Chills  . Nausea  . Emesis  : HPI: Arthur Burke is a 52 y.o. 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 Per chart review and patient, patient lives with mother. Independent prior to admission using a walker. One level home. His mother uses a walker for balance and still drives and she can assist but limited physically. Presented 08/21/2017 with gangrenous changes of the left foot, fever 100.9, WBC 18,000. MRI shows recurrent osteomyelitis extensive necrosis. Blood cultures 1 of 2 grew strep species with infectious disease consulted and placed on cefepime changed to vancomycin which was later discontinued 08/25/2017 Limb was not felt to be salvageable and underwent transtibial amputation 08/23/2017 per Dr. Sharol Given. Hospital course pain management with home dose of oxycodone resumed 50 mg every 4 hours and was also receiving intravenous Dilaudid. Hyponatremia 129-132. Remains on contact precautions for MRSA. Acute blood loss anemia 10.4 and monitored. Physical and therapy evaluations completed 08/24/2017 with recommendations of physical medicine rehabilitation consult. Patient was admitted for a comprehensive rehabilitation program    Review of Systems  Constitutional: Positive for fever. Negative for chills.  HENT: Negative for hearing loss.   Eyes: Negative for blurred vision and double vision.  Respiratory:       Occasional shortness of breath with exertion  Cardiovascular: Positive for leg swelling. Negative for chest pain and palpitations.  Gastrointestinal: Positive for constipation. Negative for nausea and vomiting.    Genitourinary: Negative for dysuria, flank pain and hematuria.  Musculoskeletal: Positive for myalgias.  Skin: Negative for rash.  Neurological: Negative for seizures.  All other systems reviewed and are negative.  Past Medical History:  Diagnosis Date  . Diabetes mellitus without complication ([place])   . Diabetic foot infection ([place])   . Hypertension   . MRSA (methicillin resistant staph aureus) culture positive   . Renal disorder   . Vision impairment    Past Surgical History:  Procedure Laterality Date  . AMPUTATION Left 08/23/2017   Procedure: AMPUTATION BELOW KNEE;  Surgeon: Newt Minion, MD;  Location: [facility];  Service: Orthopedics;  Laterality: Left;  . APPLICATION OF WOUND VAC Left 08/23/2017   Procedure: APPLICATION OF WOUND VAC;  Surgeon: Newt Minion, MD;  Location: [facility];  Service: Orthopedics;  Laterality: Left;  . KIDNEY TRANSPLANT  2006   [facility]  . SKIN GRAFT     L diabetic foot wound with debriedment and skin grafting  . TOE AMPUTATION     R great and 2nd toe   History reviewed. No pertinent family history. Social History:  reports that  has never smoked. he has never used smokeless tobacco. He reports that he does not drink alcohol. His drug history is not on file. Allergies:  Allergies  Allergen Reactions  . Zolpidem Other (See Comments)    Other Reaction: CNS Disorder   Medications Prior to Admission  Medication Sig Dispense Refill  . amLODipine (NORVASC) 10 MG tablet Take 10 mg by mouth daily.    Marland Kitchen atorvastatin (LIPITOR) 40 MG tablet Take 40 mg by mouth every evening.    . Calcium 600-200 MG-UNIT tablet Take 1 tablet by mouth daily.    Marland Kitchen  Calcium Ascorbate 500 MG TABS Take 500 mg by mouth daily.    . carvedilol (COREG) 25 MG tablet Take 25 mg by mouth 2 (two) times daily.    . cloNIDine (CATAPRES) 0.1 MG tablet Take 0.1 mg by mouth 2 (two) times daily.    . Insulin Glargine (LANTUS SOLOSTAR) 100 UNIT/ML Solostar Pen Inject 32 Units into the skin at  bedtime.    . insulin lispro (HUMALOG KWIKPEN) 100 UNIT/ML KiwkPen Inject 11-15 Units into the skin 3 (three) times daily with meals. Sliding scale    . Multiple Vitamins-Minerals (ZINC PO) Take 1 tablet by mouth every Monday, Wednesday, and Friday.    . mycophenolate (CELLCEPT) 500 MG tablet Take 500 mg by mouth 2 (two) times daily.    Marland Kitchen omeprazole (PRILOSEC) 20 MG capsule Take 20 mg by mouth 2 (two) times daily.    Marland Kitchen oxyCODONE (ROXICODONE) 15 MG immediate release tablet Take 15 mg by mouth every 4 (four) hours.    . predniSONE (DELTASONE) 5 MG tablet Take 5 mg by mouth daily.    . ramipril (ALTACE) 10 MG capsule Take 20 mg by mouth daily.    . tacrolimus (PROGRAF) 1 MG capsule Take 1 mg by mouth 2 (two) times daily.    . vitamin B-12 (CYANOCOBALAMIN) 500 MCG tablet Take 500 mcg by mouth daily.      Drug Regimen Review Drug regimen was reviewed and remains appropriate with no significant issues identified  Home: Home Living Family/patient expects to be discharged to:: Private residence Living Arrangements: Parent(mother ) Available Help at Discharge: Family, Available 24 hours/day Type of Home: House Home Access: Level entry Home Layout: One level Bathroom Shower/Tub: Chiropodist: Standard Bathroom Accessibility: Yes(with walker sideways ) Home Equipment: Environmental consultant - 2 wheels, Environmental consultant - 4 wheels, Cane - single point, Industrial/product designer History: Prior Function Level of Independence: Independent with assistive device(s) Comments: cane vs RW  Functional Status:  Mobility: Bed Mobility Overal bed mobility: Modified Independent General bed mobility comments: increased time to complete, HOB elevated Transfers Overall transfer level: Needs assistance Equipment used: Rolling walker (2 wheeled) Transfers: Sit to/from Stand, [employer] Transfers Sit to Stand: Min assist Stand pivot transfers: Min assist General transfer comment: assist to rise and steady;  verbal cues for safe hand placement; completed stand pivot EOB<>BSC  Ambulation/Gait Ambulation/Gait assistance: Min guard Ambulation Distance (Feet): 4 Feet(including pivot steps bed to chair) Assistive device: Rolling walker (2 wheeled) Gait Pattern/deviations: (small hop-steps) General Gait Details: Cues for technique and for full support on RW with bil UEs; Reports feeling like his balance is off after amputation    ADL: ADL Overall ADL's : Needs assistance/impaired Eating/Feeding: Sitting, Modified independent Grooming: Set up, Sitting Upper Body Bathing: Min guard, Sitting Lower Body Bathing: Minimal assistance, Sit to/from stand Lower Body Bathing Details (indicate cue type and reason): discussed option of utilizing lateral leans to complete task vs sit<>stand  Upper Body Dressing : Sitting, Set up Lower Body Dressing: Minimal assistance, Sit to/from stand Lower Body Dressing Details (indicate cue type and reason): Pt easily able to reach and adjust sock on RLE Toilet Transfer: Minimal assistance, Stand-pivot, BSC, RW Toileting- Clothing Manipulation and Hygiene: Minimal assistance, Sit to/from stand Functional mobility during ADLs: Minimal assistance, Rolling walker(for stand pivot ) General ADL Comments: education provided on desensitization techniques   Cognition: Cognition Overall Cognitive Status: Within Functional Limits for tasks assessed Orientation Level: Oriented X4 Cognition Arousal/Alertness: Awake/alert Behavior During Therapy: [facility]  for tasks assessed/performed Overall Cognitive Status: Within Functional Limits for tasks assessed  Physical Exam: Blood pressure (!) 165/58, pulse (!) 57, temperature 97.9 F (36.6 C), temperature source Oral, resp. rate 18, height _0  (1.803 m), weight 80.6 kg (177 lb 11.1 oz), SpO2 97 %. Physical Exam  Vitals reviewed. Constitutional: He appears well-developed.  HENT:  Head: Normocephalic.  Eyes: EOM are normal. Right eye  exhibits no discharge. Left eye exhibits no discharge.  Neck: Normal range of motion. Neck supple. No thyromegaly present.  Cardiovascular: Normal rate, regular rhythm and normal heart sounds.  Respiratory: Effort normal and breath sounds normal. No respiratory distress.  GI: Soft. Bowel sounds are normal. He exhibits no distension.   Skin. Left BKA amputation site is dressed with wound VAC in place. RLE intact without callus and breakdown.  Neurological: He is alert and oriented to person, place, and time.  Motor: B/l UE 5/5 proximal to distal RLE: HF:4+ to 5/5 prox to distal LLE: HF 3+/5 HF, KE 2-3/5.  Sensory decreased below ankle RLE. No sensory changes in UE's.  Cognitively intact.  Psych: pleasant and appropriate.     Results for orders placed or performed during the hospital encounter of 08/21/17 (from the past 48 hour(s))  Culture, blood (routine x 2)     Status: None (Preliminary result)   Collection Time: 08/23/17  3:40 PM  Result Value Ref Range   Specimen Description BLOOD RIGHT HAND    Special Requests      BOTTLES DRAWN AEROBIC ONLY Blood Culture results may not be optimal due to an excessive volume of blood received in culture bottles   Culture NO GROWTH < 24 HOURS    Report Status PENDING   Culture, blood (routine x 2)     Status: None (Preliminary result)   Collection Time: 08/23/17  3:48 PM  Result Value Ref Range   Specimen Description BLOOD RIGHT WRIST    Special Requests      BOTTLES DRAWN AEROBIC ONLY Blood Culture adequate volume   Culture NO GROWTH < 24 HOURS    Report Status PENDING   Glucose, capillary     Status: Abnormal   Collection Time: 08/23/17  4:57 PM  Result Value Ref Range   Glucose-Capillary 329 (H) 65 - 99 mg/dL  Glucose, capillary     Status: Abnormal   Collection Time: 08/23/17  8:54 PM  Result Value Ref Range   Glucose-Capillary 422 (H) 65 - 99 mg/dL  Glucose, capillary     Status: Abnormal   Collection Time: 08/23/17 11:42 PM    Result Value Ref Range   Glucose-Capillary 312 (H) 65 - 99 mg/dL  Iron and TIBC     Status: Abnormal   Collection Time: 08/24/17  7:35 AM  Result Value Ref Range   Iron 69 45 - 182 ug/dL   TIBC 204 (L) 250 - 450 ug/dL   Saturation Ratios 34 17.9 - 39.5 %   UIBC 135 ug/dL  Basic metabolic panel     Status: Abnormal   Collection Time: 08/24/17  7:35 AM  Result Value Ref Range   Sodium 130 (L) 135 - 145 mmol/L   Potassium 4.5 3.5 - 5.1 mmol/L   Chloride 99 (L) 101 - 111 mmol/L   CO2 21 (L) 22 - 32 mmol/L   Glucose, Bld 170 (H) 65 - 99 mg/dL   BUN 21 (H) 6 - 20 mg/dL   Creatinine, Ser 0.77 0.61 - 1.24 mg/dL   Calcium 8.9  8.9 - 10.3 mg/dL   GFR calc non Af Amer >60 >60 mL/min   GFR calc Af Amer >60 >60 mL/min    Comment: (NOTE) The eGFR has been calculated using the CKD EPI equation. This calculation has not been validated in all clinical situations. eGFR's persistently <60 mL/min signify possible Chronic Kidney Disease.    Anion gap 10 5 - 15  Glucose, capillary     Status: Abnormal   Collection Time: 08/24/17  7:36 AM  Result Value Ref Range   Glucose-Capillary 183 (H) 65 - 99 mg/dL  Glucose, capillary     Status: Abnormal   Collection Time: 08/24/17 11:44 AM  Result Value Ref Range   Glucose-Capillary 140 (H) 65 - 99 mg/dL  Glucose, capillary     Status: Abnormal   Collection Time: 08/24/17  5:09 PM  Result Value Ref Range   Glucose-Capillary 212 (H) 65 - 99 mg/dL  Glucose, capillary     Status: Abnormal   Collection Time: 08/24/17  9:41 PM  Result Value Ref Range   Glucose-Capillary 211 (H) 65 - 99 mg/dL  CBC     Status: Abnormal   Collection Time: 08/25/17  6:28 AM  Result Value Ref Range   WBC 12.7 (H) 4.0 - 10.5 K/uL   RBC 3.82 (L) 4.22 - 5.81 MIL/uL   Hemoglobin 10.6 (L) 13.0 - 17.0 g/dL   HCT 32.4 (L) 39.0 - 52.0 %   MCV 84.8 78.0 - 100.0 fL   MCH 27.7 26.0 - 34.0 pg   MCHC 32.7 30.0 - 36.0 g/dL   RDW 13.2 11.5 - 15.5 %   Platelets 323 150 - 400 K/uL   Basic metabolic panel     Status: Abnormal   Collection Time: 08/25/17  6:28 AM  Result Value Ref Range   Sodium 131 (L) 135 - 145 mmol/L   Potassium 4.5 3.5 - 5.1 mmol/L   Chloride 97 (L) 101 - 111 mmol/L   CO2 24 22 - 32 mmol/L   Glucose, Bld 136 (H) 65 - 99 mg/dL   BUN 22 (H) 6 - 20 mg/dL   Creatinine, Ser 0.77 0.61 - 1.24 mg/dL   Calcium 9.1 8.9 - 10.3 mg/dL   GFR calc non Af Amer >60 >60 mL/min   GFR calc Af Amer >60 >60 mL/min    Comment: (NOTE) The eGFR has been calculated using the CKD EPI equation. This calculation has not been validated in all clinical situations. eGFR's persistently <60 mL/min signify possible Chronic Kidney Disease.    Anion gap 10 5 - 15  Glucose, capillary     Status: Abnormal   Collection Time: 08/25/17  7:55 AM  Result Value Ref Range   Glucose-Capillary 128 (H) 65 - 99 mg/dL  Glucose, capillary     Status: Abnormal   Collection Time: 08/25/17 12:03 PM  Result Value Ref Range   Glucose-Capillary 164 (H) 65 - 99 mg/dL   No results found.     Medical Problem List and Plan: 1.  Decreased functional mobility secondary to left BKA 08/23/2017  -admit to inpatient rehab 2.  DVT Prophylaxis/Anticoagulation: SCDs right lower extremity 3. Pain Management/chronic pain: Oxycodone immediate release 15 mg every 4 hours as prior to admission, low dose oxycodone immediate release 5 mg every 4 hours as needed and Robaxin as needed. Will schedule gabapentin for phantom/neuropathic pain. 4. Mood: Provide emotional support 5. Neuropsych: This patient is capable of making decisions on his own behalf. 6. Skin/Wound Care: Routine  skin checks 7. Fluids/Electrolytes/Nutrition: Routine I&O's with follow-up chemistries 8. Hypertension. Coreg 25 mg twice a day, clonidine 0.1 mg twice a day, Norvasc 10 mg daily, Altace 20 mg daily 9. Diabetes mellitus peripheral neuropathy. Hemoglobin A1c 8.6. Lantus insulin 32 units daily, NovoLog 10 units 3 times a day with meals.  Check blood sugars before meals and at bedtime. Diabetic teaching 10. Renal and pancreas transplant recipient. Continue home immunosuppressive meds 11. Acute blood loss anemia. Follow-up CBC 12. Constipation. Laxative assistance 13. Hyperlipidemia. Lipitor   Post Admission Physician Evaluation: 1. Functional deficits secondary  to left BKA. 2. Patient is admitted to receive collaborative, interdisciplinary care between the physiatrist, rehab nursing staff, and therapy team. 3. Patient's level of medical complexity and substantial therapy needs in context of that medical necessity cannot be provided at a lesser intensity of care such as a SNF. 4. Patient has experienced substantial functional loss from his/her baseline which was documented above under the "Functional History" and "Functional Status" headings.  Judging by the patient's diagnosis, physical exam, and functional history, the patient has potential for functional progress which will result in measurable gains while on inpatient rehab.  These gains will be of substantial and practical use upon discharge  in facilitating mobility and self-care at the household level. 5. Physiatrist will provide 24 hour management of medical needs as well as oversight of the therapy plan/treatment and provide guidance as appropriate regarding the interaction of the two. 6. The Preadmission Screening has been reviewed and patient status is unchanged unless otherwise stated above. 7. 24 hour rehab nursing will assist with bladder management, bowel management, safety, skin/wound care, disease management, medication administration, pain management and patient education  and help integrate therapy concepts, techniques,education, etc. 8. PT will assess and treat for/with: Lower extremity strength, range of motion, stamina, balance, functional mobility, safety, adaptive techniques and equipment, NMR, pre-prosthetic ed, pain control.   Goals are: mod I. 9. OT will  assess and treat for/with: ADL's, functional mobility, safety, upper extremity strength, adaptive techniques and equipment, pre-prosthetic education, pain control, community reentry.   Goals are: mod I. Therapy may proceed with showering this patient. 10. SLP will assess and treat for/with: n/a.  Goals are: n/a. 11. Case Management and Social Worker will assess and treat for psychological issues and discharge planning. 12. Team conference will be held weekly to assess progress toward goals and to determine barriers to discharge. 13. Patient will receive at least 3 hours of therapy per day at least 5 days per week. 14. ELOS: 7 days       15. Prognosis:  excellent     Meredith Staggers, MD, [facility] 08/26/2017  Lavon Paganini [place], PA-C 08/25/2017

## 2017-08-25 NOTE — Progress Notes (Signed)
Medicine attending: I examined this patient today together with resident physician Dr. Isac Sarna and I concur with her evaluation and management plan which we discussed together.  He underwent a transtibial left BKA on January 12 as definitive treatment of recurrent osteomyelitis of the left foot.  1 of 10 blood cultures positive for strep viridans not felt to be pathologically significant per discussion with infectious disease consultant.  Staph aureus culture from the nasal mucosa but not MRSA.  We do not have deep cultures from the wound.  He was initially on vancomycin and cefipime.  Now modified to vancomycin alone.  Peak temperature 100.9 degrees within first 24 hours of admission subsequently afebrile. Wound looks clean.  Wound VAC device in place.  Still having significant pain. Lungs overall clear with minimal rales at the right base.  No cardiac murmurs.  Impression: Recurrent osteomyelitis initially requiring amputation of 2 toes of the left foot with recent ulcer of the left heel extending to the calcaneal bone and a long-standing type I diabetic with multiple systemic complications status post renal transplant now postop day 2 transtibial amputation. Duration of postop antibiotics to be determined when final cultures and sensitivities are back and per infectious disease recommendations.

## 2017-08-26 ENCOUNTER — Inpatient Hospital Stay (HOSPITAL_COMMUNITY)
Admission: RE | Admit: 2017-08-26 | Discharge: 2017-09-02 | DRG: 560 | Disposition: A | Discharge status: Home Health | Payer: Medicare Other | Source: Intra-hospital | Attending: Physical Medicine & Rehabilitation | Admitting: Physical Medicine & Rehabilitation

## 2017-08-26 ENCOUNTER — Encounter (HOSPITAL_COMMUNITY): Payer: Self-pay | Admitting: Emergency Medicine

## 2017-08-26 ENCOUNTER — Other Ambulatory Visit: Payer: Self-pay

## 2017-08-26 DIAGNOSIS — Z79899 Other long term (current) drug therapy: Secondary | ICD-10-CM | POA: Diagnosis not present

## 2017-08-26 DIAGNOSIS — E1142 Type 2 diabetes mellitus with diabetic polyneuropathy: Secondary | ICD-10-CM | POA: Diagnosis not present

## 2017-08-26 DIAGNOSIS — Z9483 Pancreas transplant status: Secondary | ICD-10-CM | POA: Diagnosis not present

## 2017-08-26 DIAGNOSIS — Z794 Long term (current) use of insulin: Secondary | ICD-10-CM

## 2017-08-26 DIAGNOSIS — E785 Hyperlipidemia, unspecified: Secondary | ICD-10-CM | POA: Diagnosis present

## 2017-08-26 DIAGNOSIS — Z4781 Encounter for orthopedic aftercare following surgical amputation: Secondary | ICD-10-CM | POA: Diagnosis present

## 2017-08-26 DIAGNOSIS — S88112S Complete traumatic amputation at level between knee and ankle, left lower leg, sequela: Secondary | ICD-10-CM

## 2017-08-26 DIAGNOSIS — Z888 Allergy status to other drugs, medicaments and biological substances status: Secondary | ICD-10-CM

## 2017-08-26 DIAGNOSIS — G8929 Other chronic pain: Secondary | ICD-10-CM | POA: Diagnosis present

## 2017-08-26 DIAGNOSIS — I10 Essential (primary) hypertension: Secondary | ICD-10-CM | POA: Diagnosis present

## 2017-08-26 DIAGNOSIS — K5900 Constipation, unspecified: Secondary | ICD-10-CM | POA: Diagnosis present

## 2017-08-26 DIAGNOSIS — Z940 Kidney transplant status: Secondary | ICD-10-CM | POA: Diagnosis not present

## 2017-08-26 DIAGNOSIS — Z89512 Acquired absence of left leg below knee: Secondary | ICD-10-CM

## 2017-08-26 DIAGNOSIS — Z7952 Long term (current) use of systemic steroids: Secondary | ICD-10-CM

## 2017-08-26 DIAGNOSIS — D62 Acute posthemorrhagic anemia: Secondary | ICD-10-CM | POA: Diagnosis present

## 2017-08-26 DIAGNOSIS — W19XXXA Unspecified fall, initial encounter: Secondary | ICD-10-CM | POA: Diagnosis not present

## 2017-08-26 DIAGNOSIS — E1042 Type 1 diabetes mellitus with diabetic polyneuropathy: Secondary | ICD-10-CM | POA: Diagnosis present

## 2017-08-26 DIAGNOSIS — N179 Acute kidney failure, unspecified: Secondary | ICD-10-CM | POA: Diagnosis present

## 2017-08-26 DIAGNOSIS — D72829 Elevated white blood cell count, unspecified: Secondary | ICD-10-CM | POA: Diagnosis present

## 2017-08-26 DIAGNOSIS — E871 Hypo-osmolality and hyponatremia: Secondary | ICD-10-CM | POA: Diagnosis present

## 2017-08-26 DIAGNOSIS — S88112A Complete traumatic amputation at level between knee and ankle, left lower leg, initial encounter: Secondary | ICD-10-CM

## 2017-08-26 DIAGNOSIS — Z8614 Personal history of Methicillin resistant Staphylococcus aureus infection: Secondary | ICD-10-CM | POA: Diagnosis not present

## 2017-08-26 LAB — GLUCOSE, CAPILLARY
GLUCOSE-CAPILLARY: 271 mg/dL — AB (ref 65–99)
Glucose-Capillary: 182 mg/dL — ABNORMAL HIGH (ref 65–99)
Glucose-Capillary: 68 mg/dL (ref 65–99)
Glucose-Capillary: 103 mg/dL — ABNORMAL HIGH (ref 65–99)
Glucose-Capillary: 304 mg/dL — ABNORMAL HIGH (ref 65–99)

## 2017-08-26 LAB — BASIC METABOLIC PANEL
ANION GAP: 8 (ref 5–15)
BUN: 21 mg/dL — ABNORMAL HIGH (ref 6–20)
CO2: 26 mmol/L (ref 22–32)
Calcium: 8.9 mg/dL (ref 8.9–10.3)
Chloride: 95 mmol/L — ABNORMAL LOW (ref 101–111)
Creatinine, Ser: 1.17 mg/dL (ref 0.61–1.24)
Glucose, Bld: 311 mg/dL — ABNORMAL HIGH (ref 65–99)
POTASSIUM: 4.8 mmol/L (ref 3.5–5.1)
SODIUM: 129 mmol/L — AB (ref 135–145)

## 2017-08-26 LAB — CBC
HCT: 31.6 % — ABNORMAL LOW (ref 39.0–52.0)
Hemoglobin: 10.4 g/dL — ABNORMAL LOW (ref 13.0–17.0)
MCH: 28.2 pg (ref 26.0–34.0)
MCHC: 32.9 g/dL (ref 30.0–36.0)
MCV: 85.6 fL (ref 78.0–100.0)
PLATELETS: 315 10*3/uL (ref 150–400)
RBC: 3.69 MIL/uL — AB (ref 4.22–5.81)
RDW: 13.3 % (ref 11.5–15.5)
WBC: 14.1 10*3/uL — AB (ref 4.0–10.5)

## 2017-08-26 LAB — CULTURE, BLOOD (ROUTINE X 2)
CULTURE: NO GROWTH
Special Requests: ADEQUATE

## 2017-08-26 MED ORDER — INSULIN GLARGINE 100 UNIT/ML ~~LOC~~ SOLN
32.0000 [IU] | Freq: Every day | SUBCUTANEOUS | Status: DC
  Administered 2017-08-26 – 2017-09-01 (×7): 32 [IU] via SUBCUTANEOUS
  Filled 2017-08-26 (×7): qty 0.32

## 2017-08-26 MED ORDER — ONDANSETRON HCL 4 MG/2ML IJ SOLN: 4.0000 mg | Freq: Four times a day (QID) | INTRAMUSCULAR | Status: DC | PRN

## 2017-08-26 MED ORDER — GLUCERNA SHAKE PO LIQD
237.0000 mL | Freq: Two times a day (BID) | ORAL | Status: DC
  Administered 2017-08-27 – 2017-09-02 (×13): 237 mL via ORAL

## 2017-08-26 MED ORDER — METHOCARBAMOL 500 MG PO TABS: 500.0000 mg | ORAL_TABLET | Freq: Four times a day (QID) | ORAL | 0 refills | Status: DC | PRN

## 2017-08-26 MED ORDER — ACETAMINOPHEN 650 MG RE SUPP: 650.0000 mg | RECTAL | Status: DC | PRN

## 2017-08-26 MED ORDER — SENNOSIDES-DOCUSATE SODIUM 8.6-50 MG PO TABS
1.0000 | ORAL_TABLET | Freq: Two times a day (BID) | ORAL | Status: DC
  Administered 2017-08-26 – 2017-09-02 (×14): 1 via ORAL
  Filled 2017-08-26 (×14): qty 1

## 2017-08-26 MED ORDER — ACETAMINOPHEN 325 MG PO TABS: 650.0000 mg | ORAL_TABLET | ORAL | Status: DC | PRN

## 2017-08-26 MED ORDER — TACROLIMUS 1 MG PO CAPS
1.0000 mg | ORAL_CAPSULE | Freq: Two times a day (BID) | ORAL | Status: DC
  Administered 2017-08-26 – 2017-09-02 (×14): 1 mg via ORAL
  Filled 2017-08-26 (×14): qty 1

## 2017-08-26 MED ORDER — ATORVASTATIN CALCIUM 40 MG PO TABS
40.0000 mg | ORAL_TABLET | Freq: Every evening | ORAL | Status: DC
  Administered 2017-08-26 – 2017-09-01 (×7): 40 mg via ORAL
  Filled 2017-08-26 (×7): qty 1

## 2017-08-26 MED ORDER — TRAZODONE HCL 50 MG PO TABS
50.0000 mg | ORAL_TABLET | Freq: Every evening | ORAL | Status: DC | PRN
  Administered 2017-08-26 – 2017-09-01 (×7): 50 mg via ORAL
  Filled 2017-08-26 (×7): qty 1

## 2017-08-26 MED ORDER — VITAMIN B-12 1000 MCG PO TABS
500.0000 ug | ORAL_TABLET | Freq: Every day | ORAL | Status: DC
  Administered 2017-08-27 – 2017-09-02 (×7): 500 ug via ORAL
  Filled 2017-08-26 (×7): qty 1

## 2017-08-26 MED ORDER — AMLODIPINE BESYLATE 10 MG PO TABS
10.0000 mg | ORAL_TABLET | Freq: Every day | ORAL | Status: DC
  Administered 2017-08-27 – 2017-09-02 (×7): 10 mg via ORAL
  Filled 2017-08-26 (×7): qty 1

## 2017-08-26 MED ORDER — METHOCARBAMOL 1000 MG/10ML IJ SOLN: 500.0000 mg | Freq: Four times a day (QID) | INTRAVENOUS | Status: DC | PRN

## 2017-08-26 MED ORDER — MYCOPHENOLATE MOFETIL 250 MG PO CAPS
500.0000 mg | ORAL_CAPSULE | Freq: Two times a day (BID) | ORAL | Status: DC
  Administered 2017-08-26 – 2017-09-02 (×14): 500 mg via ORAL
  Filled 2017-08-26 (×14): qty 2

## 2017-08-26 MED ORDER — RAMIPRIL 10 MG PO CAPS
20.0000 mg | ORAL_CAPSULE | Freq: Every day | ORAL | Status: DC
  Administered 2017-08-27 – 2017-09-02 (×7): 20 mg via ORAL
  Filled 2017-08-26 (×7): qty 2

## 2017-08-26 MED ORDER — BISACODYL 10 MG RE SUPP: 10.0000 mg | Freq: Every day | RECTAL | Status: DC | PRN

## 2017-08-26 MED ORDER — POLYETHYLENE GLYCOL 3350 17 G PO PACK: 17.0000 g | PACK | Freq: Every day | ORAL | 0 refills | Status: DC | PRN

## 2017-08-26 MED ORDER — ONDANSETRON HCL 4 MG PO TABS: 4.0000 mg | ORAL_TABLET | Freq: Four times a day (QID) | ORAL | Status: DC | PRN

## 2017-08-26 MED ORDER — OXYCODONE HCL 5 MG PO TABS
5.0000 mg | ORAL_TABLET | ORAL | Status: DC | PRN
  Administered 2017-08-26 – 2017-09-02 (×6): 5 mg via ORAL
  Filled 2017-08-26 (×7): qty 1

## 2017-08-26 MED ORDER — CLONIDINE HCL 0.1 MG PO TABS
0.1000 mg | ORAL_TABLET | Freq: Two times a day (BID) | ORAL | Status: DC
  Administered 2017-08-26 – 2017-09-02 (×14): 0.1 mg via ORAL
  Filled 2017-08-26 (×14): qty 1

## 2017-08-26 MED ORDER — RAMELTEON 8 MG PO TABS
8.0000 mg | ORAL_TABLET | Freq: Every day | ORAL | Status: DC
  Administered 2017-08-26 – 2017-09-01 (×7): 8 mg via ORAL
  Filled 2017-08-26 (×7): qty 1

## 2017-08-26 MED ORDER — PREDNISONE 10 MG PO TABS
5.0000 mg | ORAL_TABLET | Freq: Every day | ORAL | Status: DC
  Administered 2017-08-27 – 2017-09-02 (×7): 5 mg via ORAL
  Filled 2017-08-26 (×7): qty 1

## 2017-08-26 MED ORDER — PANTOPRAZOLE SODIUM 40 MG PO TBEC
40.0000 mg | DELAYED_RELEASE_TABLET | Freq: Every day | ORAL | Status: DC
  Administered 2017-08-27 – 2017-09-02 (×7): 40 mg via ORAL
  Filled 2017-08-26 (×7): qty 1

## 2017-08-26 MED ORDER — POLYETHYLENE GLYCOL 3350 17 G PO PACK
17.0000 g | PACK | Freq: Every day | ORAL | Status: DC | PRN
  Administered 2017-09-01: 17 g via ORAL
  Filled 2017-08-26: qty 1

## 2017-08-26 MED ORDER — BISACODYL 10 MG RE SUPP: 10.0000 mg | Freq: Every day | RECTAL | 0 refills | Status: DC | PRN

## 2017-08-26 MED ORDER — SORBITOL 70 % SOLN
30.0000 mL | Freq: Every day | Status: DC | PRN
  Administered 2017-08-31: 30 mL via ORAL
  Filled 2017-08-26: qty 30

## 2017-08-26 MED ORDER — JUVEN PO PACK
1.0000 | PACK | Freq: Two times a day (BID) | ORAL | Status: DC
  Administered 2017-08-26 – 2017-09-01 (×13): 1 via ORAL
  Filled 2017-08-26 (×14): qty 1

## 2017-08-26 MED ORDER — INSULIN ASPART 100 UNIT/ML ~~LOC~~ SOLN
10.0000 [IU] | Freq: Three times a day (TID) | SUBCUTANEOUS | Status: DC
  Administered 2017-08-27 – 2017-09-02 (×18): 10 [IU] via SUBCUTANEOUS

## 2017-08-26 MED ORDER — OXYCODONE HCL 5 MG PO TABS
15.0000 mg | ORAL_TABLET | ORAL | Status: DC
  Administered 2017-08-26 – 2017-09-02 (×41): 15 mg via ORAL
  Filled 2017-08-26 (×41): qty 3

## 2017-08-26 MED ORDER — INSULIN ASPART 100 UNIT/ML ~~LOC~~ SOLN
0.0000 [IU] | Freq: Three times a day (TID) | SUBCUTANEOUS | Status: DC
  Administered 2017-08-26: 11 [IU] via SUBCUTANEOUS
  Administered 2017-08-27: 2 [IU] via SUBCUTANEOUS
  Administered 2017-08-27: 3 [IU] via SUBCUTANEOUS
  Administered 2017-08-27: 2 [IU] via SUBCUTANEOUS
  Administered 2017-08-28 – 2017-08-29 (×2): 3 [IU] via SUBCUTANEOUS
  Administered 2017-08-29 (×2): 2 [IU] via SUBCUTANEOUS
  Administered 2017-08-30: 5 [IU] via SUBCUTANEOUS
  Administered 2017-08-30: 2 [IU] via SUBCUTANEOUS
  Administered 2017-08-31 (×3): 3 [IU] via SUBCUTANEOUS
  Administered 2017-09-01: 5 [IU] via SUBCUTANEOUS
  Administered 2017-09-01 – 2017-09-02 (×2): 2 [IU] via SUBCUTANEOUS

## 2017-08-26 MED ORDER — METHOCARBAMOL 500 MG PO TABS
500.0000 mg | ORAL_TABLET | Freq: Four times a day (QID) | ORAL | Status: DC | PRN
  Administered 2017-08-28 – 2017-09-02 (×11): 500 mg via ORAL
  Filled 2017-08-26 (×11): qty 1

## 2017-08-26 MED ORDER — CARVEDILOL 25 MG PO TABS
25.0000 mg | ORAL_TABLET | Freq: Two times a day (BID) | ORAL | Status: DC
  Administered 2017-08-26 – 2017-09-02 (×14): 25 mg via ORAL
  Filled 2017-08-26 (×14): qty 1

## 2017-08-26 NOTE — Care Management Important Message (Signed)
Important Message  Patient Details  Name: TAHSIN BENYO MRN: [id number] Date of Birth: 1965/10/02   Medicare Important Message Given:  Yes    Carles Collet, RN 08/26/2017, 9:55 AM

## 2017-08-26 NOTE — Progress Notes (Signed)
Report given to [facility] on 4MW. Patient transported to 4MW04 in bed.

## 2017-08-26 NOTE — IPOC Note (Addendum)
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)   Unilateral complete BKA, left, sequela (HCC)   Leukocytosis   AKI (acute kidney injury) ([place])   Diabetic peripheral neuropathy associated with type 2 diabetes mellitus ([place])     Functional Problem List: Nursing Pain, Medication Management, Skin Integrity  PT Balance, Edema, Endurance, Motor, Pain, Skin Integrity  OT Balance, Safety, Endurance, Pain, Skin Integrity  SLP    TR         Basic ADL's: OT Bathing, Dressing, Toileting     Advanced  ADL's: OT Simple Meal Preparation     Transfers: PT Bed Mobility, Bed to Chair, Car, [employer], Floor  OT Toilet, Metallurgist: PT Ambulation, Emergency planning/management officer, Stairs     Additional Impairments: OT None  SLP        TR      Anticipated Outcomes Item Anticipated Outcome  Self Feeding n/a  Swallowing      Basic self-care  mod I   Toileting  mod I    Bathroom Transfers mod I   Bowel/Bladder  mod I   Transfers  Mod I  Locomotion  supervision  Communication     Cognition     Pain  less than 3 out of 10  Safety/Judgment  Independent   Therapy Plan: PT Intensity: Minimum of 1-2 x/day ,45 to 90 minutes PT Frequency: 5 out of 7 days PT Duration Estimated Length of Stay: 5-7 days OT Intensity: Minimum of 1-2 x/day, 45 to 90 minutes OT Duration/Estimated Length of Stay: 7 days  OT frequency: 5 out of 7 days      Team Interventions: Nursing Interventions Patient/Family Education, Pain Management, Medication Management, Skin Care/Wound Management  PT interventions Ambulation/gait training, Discharge planning, Functional mobility training, Therapeutic Activities, Balance/vestibular training, Disease management/prevention, Therapeutic Exercise, Wheelchair propulsion/positioning,  Neuromuscular re-education, Pain management, UE/LE Strength taining/ROM, DME/adaptive equipment instruction, Academic librarian, IT trainer, Barrister's clerk education, UE/LE Coordination activities  OT Interventions Training and development officer, Discharge planning, Pain management, Self Care/advanced ADL retraining, Therapeutic Activities, UE/LE Coordination activities, Therapeutic Exercise, Skin care/wound managment, Patient/family education, Functional mobility training, Disease mangement/prevention, Academic librarian, Engineer, drilling, Neuromuscular re-education, Psychosocial support, UE/LE Strength taining/ROM, Splinting/orthotics, Wheelchair propulsion/positioning  SLP Interventions    TR Interventions    SW/CM Interventions Discharge Planning, Psychosocial Support, Patient/Family Education   Barriers to Discharge MD  Medical stability and Weight bearing restrictions  Nursing Wound Care    PT      OT      SLP      SW Decreased caregiver support Mom has health issues and can offer physical assist   Team Discharge Planning: Destination: PT-Home ,OT- Home , SLP-  Projected Follow-up: PT-Home health PT, OT-  Home health OT, SLP-  Projected Equipment Needs: PT-To be determined, OT- To be determined, SLP-  Equipment Details: PT-pt has RW already, OT-  Patient/family involved in discharge planning: PT- Patient,  OT-Patient, SLP-   MD ELOS: 5-7 days. Medical Rehab Prognosis:  Excellent Assessment: 52 y.o. 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 Presented 08/21/2017 with gangrenous changes of the left foot,  fever 100.9, WBC 18,000. MRI shows recurrent osteomyelitis extensive necrosis. Blood cultures 1  of 2 grew strep species with infectious disease consulted and placed on cefepime changed to vancomycin which was later discontinued 08/25/2017 Limb was not felt to be salvageable and underwent transtibial amputation 08/23/2017 per Dr. Sharol Given. Hospital course pain management with home dose of oxycodone resumed 15mg  every 4 hours and was also receiving intravenous Dilaudid. Hyponatremia. Remains on contact precautions for MRSA. Acute blood loss anemia monitored. Patient with resulting deficits with mobility, transfers, self-care.  Will set goals for Mod I with PT/OT.   See Team Conference Notes for weekly updates to the plan of care

## 2017-08-26 NOTE — Progress Notes (Signed)
Retta Diones, RN  Rehab Admission Coordinator  Physical Medicine and Rehabilitation  PMR Pre-admission  Signed  Date of Service:  08/26/2017 12:38 PM       Related encounter: ED to Hosp-Admission (Current) from 08/21/2017 in [place]            [] Hide copied text  [] Hover for details   PMR Admission Coordinator Pre-Admission Assessment  Patient: Arthur Burke is an 52 y.o., male MRN: [id number] DOB: [birth year] Height: 5\' 11"  (180.3 cm) Weight: 80.6 kg (177 lb 11.1 oz)                                                                                                                                            Insurance Information HMO: No     PPO:       PCP:       IPA:       80/20:       OTHER:   PRIMARY:  Medicare A and B      Policy#: [id number]      Subscriber:  Lane Hacker CM Name:        Phone#:       Fax#:   Pre-Cert#:        Employer:  Disabled/Not employed Benefits:  Phone #:       Name: Checked in passport one source Eff. Date: A=05/12/13 and B=09/12/14     Deduct: $1364      Out of Pocket Max: none      Life Max: N/A CIR: 100%      SNF: 100 days Outpatient: 80%     Co-Pay: 20% Home Health: 100%      Co-Pay: none DME: 80%     Co-Pay: 20% Providers: patient's choice  Emergency Contact Information        Contact Information    Name Relation Home Work Mobile   [facility]  [phone]       Current Medical History  Patient Admitting Diagnosis:  L BKA  History of Present Illness: A 52 y.o.right handed malewith history of type 1 diabetes mellitus, CKDstage III with history of renal transplant maintained on immunosuppressive therapy, 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 Per chart review and patient,patient lives with mother. Independent prior to [place] a walker. One level home.His mother  uses a walker for balance and still drivesandshe can assist but limited physically.Presented 08/21/2017 with gangrenous changes of the left foot, fever 100.9, WBC 18,000. MRI shows recurrent osteomyelitis extensive necrosis.Blood cultures 1 of 2 grew strep species with infectious disease consulted and placed on cefepime changed to vancomycin which was later discontinued 01/14/2019Limb was not felt to be salvageable and underwent transtibial amputation 08/23/2017 per Dr. Sharol Given. Hospital course pain  managementwith home dose of oxycodone resumed 50 mg every 4 hours and was also receiving intravenous Dilaudid. Hyponatremia 129-132. Remains on contact precautions for MRSA.Acute blood loss anemia 10.4and monitored. Physicalandtherapy evaluationscompleted 08/24/2017 with recommendations of physical medicine rehabilitation consult.Patient was admitted for a comprehensive rehabilitation program  Past Medical History      Past Medical History:  Diagnosis Date  . Diabetes mellitus without complication ([place])   . Diabetic foot infection ([place])   . Hypertension   . MRSA (methicillin resistant staph aureus) culture positive   . Renal disorder   . Vision impairment     Family History  family history is not on file.  Prior Rehab/Hospitalizations: No previous rehab.  Has the patient had major surgery during 100 days prior to admission? No  Current Medications   Current Facility-Administered Medications:  .  acetaminophen (TYLENOL) tablet 650 mg, 650 mg, Oral, Q4H PRN **OR** acetaminophen (TYLENOL) suppository 650 mg, 650 mg, Rectal, Q4H PRN, Newt Minion, MD .  amLODipine (NORVASC) tablet 10 mg, 10 mg, Oral, Daily, Maryellen Pile, MD, 10 mg at 08/26/17 1056 .  atorvastatin (LIPITOR) tablet 40 mg, 40 mg, Oral, QPM, Maryellen Pile, MD, 40 mg at 08/25/17 1619 .  bisacodyl (DULCOLAX) suppository 10 mg, 10 mg, Rectal, Daily PRN, Newt Minion, MD .  carvedilol (COREG) tablet 25 mg,  25 mg, Oral, BID, Maryellen Pile, MD, 25 mg at 08/26/17 1057 .  Chlorhexidine Gluconate Cloth 2 % PADS 6 each, 6 each, Topical, Daily, Annia Belt, MD, 6 each at 08/22/17 2119 .  cloNIDine (CATAPRES) tablet 0.1 mg, 0.1 mg, Oral, BID, Maryellen Pile, MD, 0.1 mg at 08/26/17 1057 .  feeding supplement (GLUCERNA SHAKE) (GLUCERNA SHAKE) liquid 237 mL, 237 mL, Oral, BID BM, Annia Belt, MD, 237 mL at 08/24/17 1732 .  HYDROmorphone (DILAUDID) injection 1 mg, 1 mg, Intravenous, Q2H PRN, Newt Minion, MD, 1 mg at 08/26/17 1054 .  insulin aspart (novoLOG) injection 0-15 Units, 0-15 Units, Subcutaneous, TID WC & HS, Tawny Asal, MD, 3 Units at 08/26/17 1240 .  insulin aspart (novoLOG) injection 10 Units, 10 Units, Subcutaneous, TID WC, Santos-Sanchez, Idalys, MD, 10 Units at 08/26/17 1240 .  insulin glargine (LANTUS) injection 32 Units, 32 Units, Subcutaneous, QHS, Santos-Sanchez, Idalys, MD, 32 Units at 08/25/17 2131 .  methocarbamol (ROBAXIN) tablet 500 mg, 500 mg, Oral, Q6H PRN, 500 mg at 08/24/17 1647 **OR** methocarbamol (ROBAXIN) 500 mg in dextrose 5 % 50 mL IVPB, 500 mg, Intravenous, Q6H PRN, Newt Minion, MD .  metoCLOPramide (REGLAN) tablet 5-10 mg, 5-10 mg, Oral, Q8H PRN **OR** metoCLOPramide (REGLAN) injection 5-10 mg, 5-10 mg, Intravenous, Q8H PRN, Newt Minion, MD .  mupirocin ointment (BACTROBAN) 2 % 1 application, 1 application, Nasal, BID, Annia Belt, MD, 1 application at 75/91/63 1054 .  mycophenolate (CELLCEPT) capsule 500 mg, 500 mg, Oral, BID, Maryellen Pile, MD, 500 mg at 08/26/17 1057 .  nutrition supplement (JUVEN) (JUVEN) powder packet 1 packet, 1 packet, Oral, BID WC, Annia Belt, MD, 1 packet at 08/26/17 1053 .  ondansetron (ZOFRAN) tablet 4 mg, 4 mg, Oral, Q6H PRN **OR** ondansetron (ZOFRAN) injection 4 mg, 4 mg, Intravenous, Q6H PRN, Newt Minion, MD .  oxyCODONE (Oxy IR/ROXICODONE) immediate release tablet 15 mg, 15 mg, Oral, Q4H,  Ledell Noss, MD, 15 mg at 08/26/17 1239 .  pantoprazole (PROTONIX) EC tablet 40 mg, 40 mg, Oral, Daily, Maryellen Pile, MD, 40 mg at 08/26/17 1057 .  polyethylene glycol (MIRALAX /  GLYCOLAX) packet 17 g, 17 g, Oral, Daily PRN, Newt Minion, MD .  predniSONE (DELTASONE) tablet 5 mg, 5 mg, Oral, Daily, Maryellen Pile, MD, 5 mg at 08/26/17 1057 .  ramelteon (ROZEREM) tablet 8 mg, 8 mg, Oral, QHS, Ledell Noss, MD, 8 mg at 08/25/17 2127 .  ramipril (ALTACE) capsule 20 mg, 20 mg, Oral, Daily, Maryellen Pile, MD, 20 mg at 08/26/17 1054 .  senna-docusate (Senokot-S) tablet 1 tablet, 1 tablet, Oral, BID, Ledell Noss, MD, 1 tablet at 08/26/17 1056 .  sodium chloride flush (NS) 0.9 % injection 3 mL, 3 mL, Intravenous, Q12H, Maryellen Pile, MD, 3 mL at 08/26/17 1059 .  tacrolimus (PROGRAF) capsule 1 mg, 1 mg, Oral, BID, Maryellen Pile, MD, 1 mg at 08/26/17 1056 .  traZODone (DESYREL) tablet 50 mg, 50 mg, Oral, QHS PRN, Ledell Noss, MD, 50 mg at 08/26/17 0223 .  vitamin B-12 (CYANOCOBALAMIN) tablet 500 mcg, 500 mcg, Oral, Daily, Maryellen Pile, MD, 500 mcg at 08/26/17 1057  Patients Current Diet: Diet Carb Modified Fluid consistency: Thin; Room service appropriate? Yes  Precautions / Restrictions Precautions Precautions: Fall Precaution Comments: wound Vac in place Restrictions Weight Bearing Restrictions: No   Has the patient had 2 or more falls or a fall with injury in the past year?No  Prior Activity Level Limited Community (1-2x/wk): Martin Majestic out a couple times a week.  His mother drives and he does the shopping.  Home Assistive Devices / Equipment Home Assistive Devices/Equipment: Gilford Rile (specify type) Home Equipment: Walker - 2 wheels, Walker - 4 wheels, Cane - single point, Shower seat  Prior Device Use: Indicate devices/aids used by the patient prior to current illness, exacerbation or injury? Walker (uses his mother's rollator walker at times)  Prior Functional Level Prior  Function Level of Independence: Independent with assistive device(s) Comments: cane vs RW  Self Care: Did the patient need help bathing, dressing, using the toilet or eating?  Independent  Indoor Mobility: Did the patient need assistance with walking from room to room (with or without device)? Independent  Stairs: Did the patient need assistance with internal or external stairs (with or without device)? Independent  Functional Cognition: Did the patient need help planning regular tasks such as shopping or remembering to take medications? Independent  Current Functional Level Cognition  Overall Cognitive Status: Within Functional Limits for tasks assessed Orientation Level: Oriented X4    Extremity Assessment (includes Sensation/Coordination)  Upper Extremity Assessment: Overall WFL for tasks assessed  Lower Extremity Assessment: Defer to PT evaluation LLE Deficits / Details: New L BKA.  He is very sensative to touch in his lower legs bil due to peripherial neuropathy.  Knee and hip ROM are WNL and strength is at least 3/5 per gross functional assessment.  Short hamstrings LLE: Unable to fully assess due to pain LLE Sensation: history of peripheral neuropathy    ADLs  Overall ADL's : Needs assistance/impaired Eating/Feeding: Sitting, Modified independent Grooming: Set up, Sitting Upper Body Bathing: Min guard, Sitting Lower Body Bathing: Minimal assistance, Sit to/from stand Lower Body Bathing Details (indicate cue type and reason): discussed option of utilizing lateral leans to complete task vs sit<>stand  Upper Body Dressing : Sitting, Set up Lower Body Dressing: Minimal assistance, Sit to/from stand Lower Body Dressing Details (indicate cue type and reason): Pt easily able to reach and adjust sock on RLE Toilet Transfer: Minimal assistance, Stand-pivot, BSC, RW Toileting- Clothing Manipulation and Hygiene: Minimal assistance, Sit to/from stand Functional mobility  during ADLs: Minimal assistance,  Rolling walker(for stand pivot ) General ADL Comments: education provided on desensitization techniques     Mobility  Overal bed mobility: Modified Independent General bed mobility comments: increased time to perform    Transfers  Overall transfer level: Needs assistance Equipment used: Rolling walker (2 wheeled) Transfers: Sit to/from Stand Sit to Stand: Min assist Stand pivot transfers: Min assist General transfer comment: Min assist for stability during power up, VCs for positioning and hand placement. Improved control with descent     Ambulation / Gait / Stairs / Wheelchair Mobility  Ambulation/Gait Ambulation/Gait assistance: Min guard, Min assist Ambulation Distance (Feet): 35 Feet(min guard 32ft, min assist 15) Assistive device: Rolling walker (2 wheeled) Gait Pattern/deviations: Step-to pattern General Gait Details: VCs for positioning with RW, min guard for inital 20 ft, however, patient with BUE fatigue for remaining 15 ft requiring min assist for stability and safety to prevent fall.  Gait velocity: decreased    Posture / Balance Balance Overall balance assessment: Needs assistance Sitting-balance support: Feet supported, No upper extremity supported Sitting balance-Leahy Scale: Good Standing balance support: Bilateral upper extremity supported Standing balance-Leahy Scale: Poor Standing balance comment: reliant on UE support, attempted to release RW for functional task performance with UEs, patient unable to maintain single leg stance without increased physical assist    Special needs/care consideration BiPAP/CPAP No CPM No Continuous Drip IV No Dialysis No      Life Vest No Oxygen No Special Bed No Trach Size No Wound Vac (area) Yes, L BKA site for 7 days post op      Skin:  Has new L BKA with VAC to incision                             Bowel mgmt: Last BM 08/26/17  Bladder mgmt: Voiding up on [facility] with assist Diabetic  mgmt Yes, on insulin at home    Previous Home Environment Living Arrangements: Parent(mother ) Available Help at Discharge: Family, Available 24 hours/day Type of Home: House Home Layout: One level Home Access: Level entry Bathroom Shower/Tub: Chiropodist: Standard Bathroom Accessibility: Yes(with walker sideways ) Home Care Services: No  Discharge Living Setting Plans for Discharge Living Setting: Lives with (comment), Apartment(Lives with mom in an apartment.) Type of Home at Discharge: Apartment Discharge Home Layout: One level Discharge Home Access: Level entry Does the patient have any problems obtaining your medications?: Yes (Describe)  Social/Family/Support Systems Patient Roles: Other (Comment)(He has a mom.) Contact Information: Madilyn Fireman - mother - [phone] Anticipated Caregiver: self and his mom Ability/Limitations of Caregiver: Mom uses a cane and cannot provide physical assistance. Caregiver Availability: 24/7 Discharge Plan Discussed with Primary Caregiver: Yes Is Caregiver In Agreement with Plan?: Yes Does Caregiver/Family have Issues with Lodging/Transportation while Pt is in Rehab?: No  Goals/Additional Needs Patient/Family Goal for Rehab: PT/OT mod I goals Expected length of stay: 5-7 days Cultural Considerations: None Dietary Needs: Carb mod, med cal, thin liquids Equipment Needs: TBD Pt/Family Agrees to Admission and willing to participate: Yes Program Orientation Provided & Reviewed with Pt/Caregiver Including Roles  & Responsibilities: Yes  Decrease burden of Care through IP rehab admission: N/A  Possible need for SNF placement upon discharge: Not anticipated  Patient Condition: This patient's condition remains as documented in the consult dated 08/25/17, in which the Rehabilitation Physician determined and documented that the patient's condition is appropriate for intensive rehabilitative care in an inpatient  rehabilitation facility pending  pain is better controlled and plans are to transition to oral medications.  PT continue to recommend CIR and patient is requiring min assist for transfers. These areas have been addressed. Will admit to inpatient rehab today.  Preadmission Screen Completed By:  Retta Diones, 08/26/2017 12:57 PM ______________________________________________________________________   Discussed status with Dr. Naaman Plummer on 08/26/17 at 1257 and received telephone approval for admission today.  Admission Coordinator:  Retta Diones, time 1257/Date 08/26/17             Cosigned by: Meredith Staggers, MD at 08/26/2017 1:35 PM  Revision History

## 2017-08-26 NOTE — Discharge Summary (Signed)
Name: Arthur Burke MRN: [id number] DOB: May 21, 1966 52 y.o. PCP: Patient, No Pcp Per  Date of Admission: 08/21/2017  3:51 AM Date of Discharge: 08/26/2017 Attending Physician: Annia Belt, MD  Discharge Diagnosis: 1. Acute osteomyelitis of left calcaneus 2.  Left lower limb ulcer with necrosis of muscle 3.  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   atorvastatin 40 MG tablet Commonly known as:  LIPITOR Take 40 mg by mouth every evening.   bisacodyl 10 MG suppository Commonly known as:  DULCOLAX Place 1 suppository (10 mg total) rectally daily as needed for moderate constipation.   Calcium 600-200 MG-UNIT tablet Take 1 tablet by mouth daily.   Calcium Ascorbate 500 MG Tabs Take 500 mg by mouth daily.   carvedilol 25 MG tablet Commonly known as:  COREG Take 25 mg by mouth 2 (two) times daily.   cloNIDine 0.1 MG tablet Commonly known as:  CATAPRES Take 0.1 mg by mouth 2 (two) times daily.   HUMALOG KWIKPEN 100 UNIT/ML  KiwkPen Generic drug:  insulin lispro Inject 11-15 Units into the skin 3 (three) times daily with meals. Sliding scale   LANTUS SOLOSTAR 100 UNIT/ML Solostar Pen Generic drug:  Insulin Glargine Inject 32 Units into the skin at bedtime.   methocarbamol 500 MG tablet Commonly known as:  ROBAXIN Take 1 tablet (500 mg total) by mouth every 6 (six) hours as needed for muscle spasms.   mycophenolate 500 MG tablet Commonly known as:  CELLCEPT Take 500 mg by mouth 2 (two) times daily.   omeprazole 20 MG capsule Commonly known as:  PRILOSEC Take 20 mg by mouth 2 (two) times daily.   oxyCODONE 15 MG immediate release tablet Commonly known as:  ROXICODONE Take 15 mg by mouth every 4 (four) hours.   polyethylene glycol packet Commonly known as:  MIRALAX / GLYCOLAX Take 17 g by mouth daily as needed for mild constipation.   predniSONE 5 MG tablet Commonly known as:  DELTASONE Take 5 mg by mouth daily.   ramipril 10 MG capsule Commonly known as:  ALTACE Take 20 mg by mouth daily.   tacrolimus 1 MG capsule Commonly known as:  PROGRAF Take 1 mg by mouth 2 (two) times daily.   vitamin B-12 500 MCG tablet Commonly known as:  CYANOCOBALAMIN Take 500 mcg by mouth daily.   ZINC PO Take 1 tablet by mouth every Monday, Wednesday, and Friday.       Disposition and follow-up:  Arthur Burke was discharged from [facility] in Stable condition.  At the hospital follow up visit please address:  1.  Please assess pain and for signs/symptoms of infection and surgical site.  2.  Labs / imaging needed at time of follow-up: CBC to check Hgb   3.  Pending labs/ test needing follow-up: None   Follow-up Appointments: Follow-up Information    Newt Minion, MD Follow up in 1 week(s).   Specialty:  Orthopedic Surgery Contact information: [place] [address] [place] Follow up.   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 Acute osteomyelitis of left calcaneus and left lower limb ulcer with necrosis of muscle: Patient presented with left foot ulcer associated with purulent drainage and was found to have left calcaneus osteomyelitis as well as an intraosseous abscess seen on MRI.  He underwent left transtibial amputation on 1/12 and tolerated the procedure well.  He received 5 days of vancomycin.  Per infectious disease, no need for further antibiotic therapy as infectious source removed.  Patient was discharged CIR per PT recommendations.  2.  Streptococcal bacteremia: Patient grew Streptococcus viridans sensitive to vancomycin in 1/2 blood cultures.  This was thought to be a contaminant and transient in nature as repeat blood cultures negative.  Given that he had received 5 days of vancomycin and repeat blood cultures were negative there was no indication to continue antibiotic therapy.   He was continued on all of his home medications for immunosuppression and diabetes.  Discharge Vitals:     BP 132/65 (BP Location: Right Arm)   Pulse 60   Temp (!) 97.5 F (36.4 C) (Oral)   Resp 17   Ht 5\' 11"  (1.803 m)   Wt 177 lb 11.1 oz (80.6 kg)   SpO2 96%   BMI 24.78 kg/m   Pertinent Labs, Studies, and Procedures:  CBC Latest Ref Rng & Units 08/27/2017 08/26/2017 08/25/2017  WBC 4.0 - 10.5 K/uL 14.9(H) 14.1(H) 12.7(H)  Hemoglobin 13.0 - 17.0 g/dL 11.1(L) 10.4(L) 10.6(L)  Hematocrit 39.0 - 52.0 % 33.0(L) 31.6(L) 32.4(L)  Platelets 150 - 400 K/uL 361 315 323   BMP Latest Ref Rng & Units 08/27/2017 08/26/2017 08/25/2017  Glucose 65 - 99 mg/dL 214(H) 311(H) 136(H)  BUN 6 - 20 mg/dL 24(H) 21(H) 22(H)  Creatinine 0.61 - 1.24 mg/dL 1.26(H) 1.17 0.77  Sodium 135 - 145 mmol/L 133(L) 129(L) 131(L)  Potassium 3.5 - 5.1 mmol/L 4.9 4.8 4.5  Chloride 101 - 111 mmol/L 100(L) 95(L) 97(L)  CO2 22 - 32 mmol/L 26 26 24   Calcium 8.9 - 10.3 mg/dL 9.2 8.9 9.1   MRI L foot and L ankle 08/21/2017: IMPRESSION: 1. 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 2. Soft tissue ulceration over the anterior ankle with exposed anterior tibialis tendon and resultant infectious tenosynovitis. Extensive surrounding cellulitis along the anterior lower leg and ankle. 3. No osteomyelitis or soft tissue abscess within the forefoot.  CXR 08/21/2017: FINDINGS: The heart size and mediastinal contours are within normal limits. There is mild bibasilar atelectasis. Both lungs are otherwise clear. The visualized skeletal structures are unremarkable.   Discharge Instructions: Discharge Instructions    Ambulatory referral to Physical Medicine Rehab   Complete by:  As directed    1 month post- hospital follow up for left BKA   Call MD for:  redness, tenderness, or signs of infection (pain, swelling, redness, odor or green/yellow discharge around incision site)   Complete by:  As directed    Call MD for:  severe uncontrolled pain   Complete  by:  As directed    Call MD for:  temperature >100.4   Complete by:  As directed    Diet Carb Modified   Complete by:  As directed    Discharge instructions   Complete by:  As directed    You were admitted to the hospital with an ulcer in your left foot and we found an infection in your bone that required amputation. You completed your antibiotics and will not need any further antibiotics. The orthopedic surgeon will continue to follow up with you in rehabilitation. They will manage your wound vac. They will also set up follow up for you when you leave. Please make an hospital follow up with your regular doctor after you complete your rehabilitation. If you do not have one, we have provided you with the information for the [place]. Please call us if you have any questions.   Increase activity slowly   Complete by:  As directed       Signed: Welford Roche, MD 08/28/2017, 11:45 AM   Pager: [phone]

## 2017-08-26 NOTE — PMR Pre-admission (Signed)
PMR Admission Coordinator Pre-Admission Assessment  Patient: Arthur Burke is an 52 y.o., male MRN: [id number] DOB: 11/24/1965 Height: 5\' 11"  (180.3 cm) Weight: 80.6 kg (177 lb 11.1 oz)             Insurance Information HMO: No     PPO:       PCP:       IPA:       80/20:       OTHER:   PRIMARY:  Medicare A and B      Policy#: [id number]      Subscriber:  Lane Hacker CM Name:        Phone#:       Fax#:   Pre-Cert#:        Employer:  Disabled/Not employed Benefits:  Phone #:       Name: Checked in passport one source Eff. Date: A=05/12/13 and B=09/12/14     Deduct: $1364      Out of Pocket Max: none      Life Max: N/A CIR: 100%      SNF: 100 days Outpatient: 80%     Co-Pay: 20% Home Health: 100%      Co-Pay: none DME: 80%     Co-Pay: 20% Providers: patient's choice  Emergency Contact Information Contact Information    Name Relation Home Work Mobile   [facility]  [phone]       Current Medical History  Patient Admitting Diagnosis:  L BKA  History of Present Illness: A 52 y.o.right handed malewith history of type 1 diabetes mellitus, CKDstage III with history of renal transplant maintained on immunosuppressive therapy, 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 Per chart review and patient,patient lives with mother. Independent prior to [place] a walker. One level home.His mother uses a walker for balance and still drivesandshe can assist but limited physically.Presented 08/21/2017 with gangrenous changes of the left foot, fever 100.9, WBC 18,000. MRI shows recurrent osteomyelitis extensive necrosis. Blood cultures 1 of 2 grew strep species with infectious disease consulted and placed on cefepime changed to vancomycin which was later discontinued 08/25/2017 Limb was not felt to be salvageable and underwent transtibial amputation 08/23/2017 per Dr. Sharol Given.  Hospital course pain management with home dose of oxycodone resumed 50 mg every 4 hours and was also receiving intravenous Dilaudid. Hyponatremia 129-132. Remains on contact precautions for MRSA. Acute blood loss anemia 10.4 and monitored. Physical and therapy evaluations completed 08/24/2017 with recommendations of physical medicine rehabilitation consult. Patient was admitted for a comprehensive rehabilitation program  Past Medical History  Past Medical History:  Diagnosis Date  . Diabetes mellitus without complication ([place])   . Diabetic foot infection ([place])   . Hypertension   . MRSA (methicillin resistant staph aureus) culture positive   . Renal disorder   . Vision impairment     Family History  family history is not on file.  Prior Rehab/Hospitalizations: No previous rehab.  Has the patient had major surgery during 100 days prior to admission? No  Current Medications   Current Facility-Administered Medications:  .  acetaminophen (TYLENOL) tablet 650 mg, 650 mg, Oral, Q4H PRN **OR** acetaminophen (TYLENOL) suppository 650 mg, 650 mg, Rectal, Q4H PRN, Newt Minion, MD .  amLODipine (NORVASC) tablet 10 mg, 10 mg, Oral, Daily, Maryellen Pile, MD, 10 mg at 08/26/17 1056 .  atorvastatin (LIPITOR) tablet 40 mg, 40 mg, Oral, QPM, [place],  Ovid Curd, MD, 40 mg at 08/25/17 1619 .  bisacodyl (DULCOLAX) suppository 10 mg, 10 mg, Rectal, Daily PRN, Newt Minion, MD .  carvedilol (COREG) tablet 25 mg, 25 mg, Oral, BID, Maryellen Pile, MD, 25 mg at 08/26/17 1057 .  Chlorhexidine Gluconate Cloth 2 % PADS 6 each, 6 each, Topical, Daily, Annia Belt, MD, 6 each at 08/22/17 2119 .  cloNIDine (CATAPRES) tablet 0.1 mg, 0.1 mg, Oral, BID, Maryellen Pile, MD, 0.1 mg at 08/26/17 1057 .  feeding supplement (GLUCERNA SHAKE) (GLUCERNA SHAKE) liquid 237 mL, 237 mL, Oral, BID BM, Annia Belt, MD, 237 mL at 08/24/17 1732 .  HYDROmorphone (DILAUDID) injection 1 mg, 1 mg, Intravenous, Q2H  PRN, Newt Minion, MD, 1 mg at 08/26/17 1054 .  insulin aspart (novoLOG) injection 0-15 Units, 0-15 Units, Subcutaneous, TID WC & HS, Tawny Asal, MD, 3 Units at 08/26/17 1240 .  insulin aspart (novoLOG) injection 10 Units, 10 Units, Subcutaneous, TID WC, Santos-Sanchez, Idalys, MD, 10 Units at 08/26/17 1240 .  insulin glargine (LANTUS) injection 32 Units, 32 Units, Subcutaneous, QHS, Santos-Sanchez, Idalys, MD, 32 Units at 08/25/17 2131 .  methocarbamol (ROBAXIN) tablet 500 mg, 500 mg, Oral, Q6H PRN, 500 mg at 08/24/17 1647 **OR** methocarbamol (ROBAXIN) 500 mg in dextrose 5 % 50 mL IVPB, 500 mg, Intravenous, Q6H PRN, Newt Minion, MD .  metoCLOPramide (REGLAN) tablet 5-10 mg, 5-10 mg, Oral, Q8H PRN **OR** metoCLOPramide (REGLAN) injection 5-10 mg, 5-10 mg, Intravenous, Q8H PRN, Newt Minion, MD .  mupirocin ointment (BACTROBAN) 2 % 1 application, 1 application, Nasal, BID, Annia Belt, MD, 1 application at 83/66/29 1054 .  mycophenolate (CELLCEPT) capsule 500 mg, 500 mg, Oral, BID, Maryellen Pile, MD, 500 mg at 08/26/17 1057 .  nutrition supplement (JUVEN) (JUVEN) powder packet 1 packet, 1 packet, Oral, BID WC, Annia Belt, MD, 1 packet at 08/26/17 1053 .  ondansetron (ZOFRAN) tablet 4 mg, 4 mg, Oral, Q6H PRN **OR** ondansetron (ZOFRAN) injection 4 mg, 4 mg, Intravenous, Q6H PRN, Newt Minion, MD .  oxyCODONE (Oxy IR/ROXICODONE) immediate release tablet 15 mg, 15 mg, Oral, Q4H, Ledell Noss, MD, 15 mg at 08/26/17 1239 .  pantoprazole (PROTONIX) EC tablet 40 mg, 40 mg, Oral, Daily, Maryellen Pile, MD, 40 mg at 08/26/17 1057 .  polyethylene glycol (MIRALAX / GLYCOLAX) packet 17 g, 17 g, Oral, Daily PRN, Newt Minion, MD .  predniSONE (DELTASONE) tablet 5 mg, 5 mg, Oral, Daily, Maryellen Pile, MD, 5 mg at 08/26/17 1057 .  ramelteon (ROZEREM) tablet 8 mg, 8 mg, Oral, QHS, Ledell Noss, MD, 8 mg at 08/25/17 2127 .  ramipril (ALTACE) capsule 20 mg, 20 mg, Oral, Daily, Maryellen Pile, MD, 20 mg at 08/26/17 1054 .  senna-docusate (Senokot-S) tablet 1 tablet, 1 tablet, Oral, BID, Ledell Noss, MD, 1 tablet at 08/26/17 1056 .  sodium chloride flush (NS) 0.9 % injection 3 mL, 3 mL, Intravenous, Q12H, Maryellen Pile, MD, 3 mL at 08/26/17 1059 .  tacrolimus (PROGRAF) capsule 1 mg, 1 mg, Oral, BID, Maryellen Pile, MD, 1 mg at 08/26/17 1056 .  traZODone (DESYREL) tablet 50 mg, 50 mg, Oral, QHS PRN, Ledell Noss, MD, 50 mg at 08/26/17 0223 .  vitamin B-12 (CYANOCOBALAMIN) tablet 500 mcg, 500 mcg, Oral, Daily, Maryellen Pile, MD, 500 mcg at 08/26/17 1057  Patients Current Diet: Diet Carb Modified Fluid consistency: Thin; Room service appropriate? Yes  Precautions / Restrictions Precautions Precautions: Fall Precaution Comments: wound Vac in place Restrictions  Weight Bearing Restrictions: No   Has the patient had 2 or more falls or a fall with injury in the past year?No  Prior Activity Level Limited Community (1-2x/wk): [place] Majestic out a couple times a week.  His mother drives and he does the shopping.  Home Assistive Devices / Equipment Home Assistive Devices/Equipment: Gilford Rile (specify type) Home Equipment: Walker - 2 wheels, Walker - 4 wheels, Cane - single point, Shower seat  Prior Device Use: Indicate devices/aids used by the patient prior to current illness, exacerbation or injury? Walker (uses his mother's rollator walker at times)  Prior Functional Level Prior Function Level of Independence: Independent with assistive device(s) Comments: cane vs RW  Self Care: Did the patient need help bathing, dressing, using the toilet or eating?  Independent  Indoor Mobility: Did the patient need assistance with walking from room to room (with or without device)? Independent  Stairs: Did the patient need assistance with internal or external stairs (with or without device)? Independent  Functional Cognition: Did the patient need help planning regular tasks such as shopping or  remembering to take medications? Independent  Current Functional Level Cognition  Overall Cognitive Status: Within Functional Limits for tasks assessed Orientation Level: Oriented X4    Extremity Assessment (includes Sensation/Coordination)  Upper Extremity Assessment: Overall WFL for tasks assessed  Lower Extremity Assessment: Defer to PT evaluation LLE Deficits / Details: New L BKA.  He is very sensative to touch in his lower legs bil due to peripherial neuropathy.  Knee and hip ROM are WNL and strength is at least 3/5 per gross functional assessment.  Short hamstrings LLE: Unable to fully assess due to pain LLE Sensation: history of peripheral neuropathy    ADLs  Overall ADL's : Needs assistance/impaired Eating/Feeding: Sitting, Modified independent Grooming: Set up, Sitting Upper Body Bathing: Min guard, Sitting Lower Body Bathing: Minimal assistance, Sit to/from stand Lower Body Bathing Details (indicate cue type and reason): discussed option of utilizing lateral leans to complete task vs sit<>stand  Upper Body Dressing : Sitting, Set up Lower Body Dressing: Minimal assistance, Sit to/from stand Lower Body Dressing Details (indicate cue type and reason): Pt easily able to reach and adjust sock on RLE Toilet Transfer: Minimal assistance, Stand-pivot, BSC, RW Toileting- Clothing Manipulation and Hygiene: Minimal assistance, Sit to/from stand Functional mobility during ADLs: Minimal assistance, Rolling walker(for stand pivot ) General ADL Comments: education provided on desensitization techniques     Mobility  Overal bed mobility: Modified Independent General bed mobility comments: increased time to perform    Transfers  Overall transfer level: Needs assistance Equipment used: Rolling walker (2 wheeled) Transfers: Sit to/from Stand Sit to Stand: Min assist Stand pivot transfers: Min assist General transfer comment: Min assist for stability during power up, VCs for  positioning and hand placement. Improved control with descent     Ambulation / Gait / Stairs / Wheelchair Mobility  Ambulation/Gait Ambulation/Gait assistance: Min guard, Min assist Ambulation Distance (Feet): 35 Feet(min guard 27ft, min assist 15) Assistive device: Rolling walker (2 wheeled) Gait Pattern/deviations: Step-to pattern General Gait Details: VCs for positioning with RW, min guard for inital 20 ft, however, patient with BUE fatigue for remaining 15 ft requiring min assist for stability and safety to prevent fall.  Gait velocity: decreased    Posture / Balance Balance Overall balance assessment: Needs assistance Sitting-balance support: Feet supported, No upper extremity supported Sitting balance-Leahy Scale: Good Standing balance support: Bilateral upper extremity supported Standing balance-Leahy Scale: Poor Standing balance comment: reliant on  UE support, attempted to release RW for functional task performance with UEs, patient unable to maintain single leg stance without increased physical assist    Special needs/care consideration BiPAP/CPAP No CPM No Continuous Drip IV No Dialysis No      Life Vest No Oxygen No Special Bed No Trach Size No Wound Vac (area) Yes, L BKA site for 7 days post op      Skin:  Has new L BKA with VAC to incision                             Bowel mgmt: Last BM 08/26/17  Bladder mgmt: Voiding up on [facility] with assist Diabetic mgmt Yes, on insulin at home    Previous Home Environment Living Arrangements: Parent(mother ) Available Help at Discharge: Family, Available 24 hours/day Type of Home: House Home Layout: One level Home Access: Level entry Bathroom Shower/Tub: Chiropodist: Standard Bathroom Accessibility: Yes(with walker sideways ) Home Care Services: No  Discharge Living Setting Plans for Discharge Living Setting: Lives with (comment), Apartment(Lives with mom in an apartment.) Type of Home at Discharge:  Apartment Discharge Home Layout: One level Discharge Home Access: Level entry Does the patient have any problems obtaining your medications?: Yes (Describe)  Social/Family/Support Systems Patient Roles: Other (Comment)(He has a mom.) Contact Information: Madilyn Fireman - mother - [phone] Anticipated Caregiver: self and his mom Ability/Limitations of Caregiver: Mom uses a cane and cannot provide physical assistance. Caregiver Availability: 24/7 Discharge Plan Discussed with Primary Caregiver: Yes Is Caregiver In Agreement with Plan?: Yes Does Caregiver/Family have Issues with Lodging/Transportation while Pt is in Rehab?: No  Goals/Additional Needs Patient/Family Goal for Rehab: PT/OT mod I goals Expected length of stay: 5-7 days Cultural Considerations: None Dietary Needs: Carb mod, med cal, thin liquids Equipment Needs: TBD Pt/Family Agrees to Admission and willing to participate: Yes Program Orientation Provided & Reviewed with Pt/Caregiver Including Roles  & Responsibilities: Yes  Decrease burden of Care through IP rehab admission: N/A  Possible need for SNF placement upon discharge: Not anticipated  Patient Condition: This patient's condition remains as documented in the consult dated 08/25/17, in which the Rehabilitation Physician determined and documented that the patient's condition is appropriate for intensive rehabilitative care in an inpatient rehabilitation facility pending pain is better controlled and plans are to transition to oral medications.  PT continue to recommend CIR and patient is requiring min assist for transfers. These areas have been addressed.  Will admit to inpatient rehab today.  Preadmission Screen Completed By:  Retta Diones, 08/26/2017 12:57 PM ______________________________________________________________________   Discussed status with Dr. Naaman Plummer on 08/26/17 at 1257 and received telephone approval for admission today.  Admission Coordinator:   Retta Diones, time 1257/Date 08/26/17

## 2017-08-26 NOTE — Progress Notes (Addendum)
Rehab admissions - I met with patient and I gave him rehab booklets.  PT recommending inpatient rehab.  I spoke with patient about inpatient rehab.  He tells me that he prefers to go home with [facility] therapies and will talk to his momtonight about going home with [facility] therapies.  I received a call from case manager and from MD asking if we were going to admit to CIR.  I do have bed availability, but patient has to be willing and agreeable to inpatient rehab admission.  Call me for questions.  #[phone]  12:21 Patient is now willing to admit to inpatient rehab today.  Bed available and will admit today.

## 2017-08-26 NOTE — Progress Notes (Signed)
Related encounter: ED to Hosp-Admission (Current) from 08/21/2017 in [facility] [place]      Signed      Expand All Collapse All       [] Hide copied text  [] Hover for details        Physical Medicine and Rehabilitation Consult Reason for Consult: Decreased functional mobility Referring Physician: [facility]   HPI: Arthur Burke is a 52 y.o. 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 Per chart review and patient, patient lives with mother. Independent prior to admission using a walker. One level home. His mother uses a walker for balance and still drives and she can assist but limited physically. Presented 08/21/2017 with gangrenous changes of the left foot, fever 100.9, WBC 18,000. MRI shows recurrent osteomyelitis extensive necrosis. 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 Sharol Given. Hospital course pain management. Hyponatremia 129-132. Remains on contact precautions for MRSA. Physical therapy evaluation completed 08/24/2017 with recommendations of physical medicine rehabilitation consult.   Review of Systems  HENT: Negative for hearing loss.   Eyes: Negative for blurred vision and double vision.  Respiratory: Positive for shortness of breath. Negative for cough.   Cardiovascular: Positive for leg swelling. Negative for chest pain and palpitations.  Gastrointestinal: Negative for nausea.  Genitourinary: Negative for dysuria, flank pain and hematuria.  Musculoskeletal: Positive for joint pain and myalgias.  Skin: Negative for rash.  Neurological: Negative for seizures.  All other systems reviewed and are negative.      Past Medical History:  Diagnosis Date  . Diabetes mellitus without complication (Arthur Burke)   .  Diabetic foot infection (Arthur Burke)   . Hypertension   . MRSA (methicillin resistant staph aureus) culture positive   . Renal disorder   . Vision impairment         Past Surgical History:  Procedure Laterality Date  . AMPUTATION Left 08/23/2017   Procedure: AMPUTATION BELOW KNEE;  Surgeon: Newt Minion, MD;  Location: [place];  Service: Orthopedics;  Laterality: Left;  . APPLICATION OF WOUND VAC Left 08/23/2017   Procedure: APPLICATION OF WOUND VAC;  Surgeon: Newt Minion, MD;  Location: [place];  Service: Orthopedics;  Laterality: Left;  . KIDNEY TRANSPLANT  2006   [facility]  . SKIN GRAFT     L diabetic foot wound with debriedment and skin grafting  . TOE AMPUTATION     R great and 2nd toe   No pertinent family history of amputation. Social History:  reports that  has never smoked. he has never used smokeless tobacco. He reports that he does not drink alcohol. His drug history is not on file. Allergies:       Allergies  Allergen Reactions  . Zolpidem Other (See Comments)    Other Reaction: CNS Disorder         Medications Prior to Admission  Medication Sig Dispense Refill  . amLODipine (NORVASC) 10 MG tablet Take 10 mg by mouth daily.    Marland Kitchen atorvastatin (LIPITOR) 40 MG tablet Take 40 mg by mouth every evening.    . Calcium 600-200 MG-UNIT tablet Take 1 tablet by mouth daily.    . Calcium Ascorbate 500 MG TABS Take 500 mg by mouth daily.    . carvedilol (COREG) 25 MG tablet Take 25 mg by mouth 2 (  two) times daily.    . cloNIDine (CATAPRES) 0.1 MG tablet Take 0.1 mg by mouth 2 (two) times daily.    . Insulin Glargine (LANTUS SOLOSTAR) 100 UNIT/ML Solostar Pen Inject 32 Units into the skin at bedtime.    . insulin lispro (HUMALOG KWIKPEN) 100 UNIT/ML KiwkPen Inject 11-15 Units into the skin 3 (three) times daily with meals. Sliding scale    . Multiple Vitamins-Minerals (ZINC PO) Take 1 tablet by mouth every Monday, Wednesday, and Friday.    .  mycophenolate (CELLCEPT) 500 MG tablet Take 500 mg by mouth 2 (two) times daily.    Marland Kitchen omeprazole (PRILOSEC) 20 MG capsule Take 20 mg by mouth 2 (two) times daily.    Marland Kitchen oxyCODONE (ROXICODONE) 15 MG immediate release tablet Take 15 mg by mouth every 4 (four) hours.    . predniSONE (DELTASONE) 5 MG tablet Take 5 mg by mouth daily.    . ramipril (ALTACE) 10 MG capsule Take 20 mg by mouth daily.    . tacrolimus (PROGRAF) 1 MG capsule Take 1 mg by mouth 2 (two) times daily.    . vitamin B-12 (CYANOCOBALAMIN) 500 MCG tablet Take 500 mcg by mouth daily.      Home: Home Living Family/patient expects to be discharged to:: Private residence Living Arrangements: Parent(mother) Available Help at Discharge: Family, Available 24 hours/day Type of Home: House Home Access: Level entry [place]: One level Bathroom Shower/Tub: Chiropodist: Standard Bathroom Accessibility: Yes(with walker sideways) Home Equipment: Environmental consultant - 2 wheels, Environmental consultant - 4 wheels, Cane - single point, Careers adviser History: Prior Function Level of Independence: Independent with assistive device(s) Comments: cane vs RW Functional Status:  Mobility: Bed Mobility Overal bed mobility: Modified Independent General bed mobility comments: Slow moving, HOB elevated Transfers Overall transfer level: Needs assistance Equipment used: Rolling walker (2 wheeled) Transfers: Sit to/from Stand Sit to Stand: Min assist General transfer comment: Min assist to steady, especially during transition of hands form bed to RW Ambulation/Gait Ambulation/Gait assistance: Min guard Ambulation Distance (Feet): 4 Feet(including pivot steps bed to chair) Assistive device: Rolling walker (2 wheeled) Gait Pattern/deviations: (small hop-steps) General Gait Details: Cues for technique and for full support on RW with bil UEs; Reports feeling like his balance is off after  amputation  ADL:  Cognition: Cognition Overall Cognitive Status: Within Functional Limits for tasks assessed Orientation Level: Oriented X4 Cognition Arousal/Alertness: Awake/alert Behavior During Therapy: WFL for tasks assessed/performed Overall Cognitive Status: Within Functional Limits for tasks assessed  Blood pressure 114/66, pulse 62, temperature 98.3 F (36.8 C), temperature source Oral, resp. rate 18, height 5\' 11"  (1.803 m), weight 80.6 kg (177 lb 11.1 oz), SpO2 98 %. Physical Exam  Vitals reviewed. Constitutional: He is oriented to person, place, and time. He appears well-developed and well-nourished.  HENT:  Head: Normocephalic and atraumatic.  Eyes: EOM are normal. Right eye exhibits no discharge. Left eye exhibits no discharge.  Neck: Normal range of motion. Neck supple. No thyromegaly present.  Cardiovascular: Normal rate, regular rhythm and normal heart sounds.  Respiratory: Effort normal and breath sounds normal. No respiratory distress.  GI: Soft. Bowel sounds are normal. He exhibits no distension.  Musculoskeletal: He exhibits tenderness (Left BKA). He exhibits no edema.  Neurological: He is alert and oriented to person, place, and time.  Motor: B/l UE 5/5 proximal to distal RLE: HF: 4+/5, distally 5/5 LLE: HF 4/5 (pain inhibition)  Skin:  Left BKA amputation site is dressed with wound VAC  in place. Appropriately tender  Psychiatric: He has a normal mood and affect. His behavior is normal.             Assessment/Plan: Diagnosis: Left BKA Labs and images independently reviewed.  Records reviewed and summated above. Clean amputation daily with soap and water Monitor incision site for signs of infection or impending skin breakdown. Staples to remain in place for 3-4 weeks Stump shrinker, for edema control  Scar mobilization massaging to prevent soft tissue adherence Stump protector during therapies Prevent flexion contractures by implementing the  following:              Encourage prone lying for 20-30 mins per day BID to avoid hip flexion       Contractures if medically appropriate;             Avoid pillow under knees when patient is lying in bed in order to prevent both        knee and hip flexion contractures;             Avoid prolonged sitting Post surgical pain control with oral medication Phantom limb pain control with physical modalities including desensitization techniques (gentle self massage to the residual stump,hot packs if sensation intact, [place]) and mirror therapy, TENS. If ineffective, consider pharmacological treatment for neuropathic pain (e.g gabapentin, pregabalin, amytriptalyine, duloxetine).  When using wheelchair, patient should have knee on amputated side fully extended with board under the seat cushion. Avoid injury to contralateral side  1. Does the need for close, 24 hr/day medical supervision in concert with the patient's rehab needs make it unreasonable for this patient to be served in a less intensive setting? Potentially  2. Co-Morbidities requiring supervision/potential complications: type 1 diabetes mellitus (Monitor in accordance with exercise and adjust meds as necessary), CKD stage III with history of renal transplant maintained on immunosuppressive therapy (avoid nephrotoxic meds), post-op pain management (Biofeedback training with therapies to help reduce reliance on opiate pain medications, particularly IV dilaudid, monitor pain control during therapies, and sedation at rest and titrate to maximum efficacy to ensure participation and gains in therapies), hyponatremia (cont to monitor, treat if necessary), HTN (monitor and provide prns in accordance with increased physical exertion and pain), d/c IV Vanc when appropriate, leukocytosis (cont to monitor for signs and symptoms of infection, further workup if indicated), ABLA (transfuse if necessary to ensure appropriate perfusion for increased activity  tolerance) 3. Due to bowel management, safety, skin/wound care, disease management, pain management and patient education, does the patient require 24 hr/day rehab nursing? Yes 4. Does the patient require coordinated care of a physician, rehab nurse, PT (1-2 hrs/day, 5 days/week) and OT (1-2 hrs/day, 5 days/week) to address physical and functional deficits in the context of the above medical diagnosis(es)? Potentially Addressing deficits in the following areas: balance, endurance, locomotion, strength, transferring, bathing, dressing, toileting and psychosocial support 5. Can the patient actively participate in an intensive therapy program of at least 3 hrs of therapy per day at least 5 days per week? Yes 6. The potential for patient to make measurable gains while on inpatient rehab is good 7. Anticipated functional outcomes upon discharge from inpatient rehab are modified independent  with PT, modified independent with OT, n/a with SLP. 8. Estimated rehab length of stay to reach the above functional goals is: 5-7 days. 9. Anticipated D/C setting: Home 10. Anticipated post D/C treatments: HH therapy and Home excercise program 11. Overall Rehab/Functional Prognosis: good  RECOMMENDATIONS: This patient's condition is  appropriate for continued rehabilitative care in the following setting: Recommend follow up therapies to determine progress once pain better controlled.  Pt may not require CIR. If so, recommend PM&R outpt follow up. Patient has agreed to participate in recommended program. Potentially Note that insurance prior authorization may be required for reimbursement for recommended care.  Comment: Rehab Admissions Coordinator to follow up.  Delice Lesch, MD, ABPMR Lavon Paganini Angiulli, PA-C 08/25/2017          Revision History                        Routing History

## 2017-08-26 NOTE — Clinical Social Work Note (Signed)
Two social work consults received for SNF placement for patient. Patient will discharge to CIR today per note. CSW signing off as no SW intervention services needed.  Alvia Tory Givens, MSW, LCSW Licensed Clinical Social Worker [place] [phone]

## 2017-08-26 NOTE — Progress Notes (Signed)
Patient admitted about 1650 to 36M04. Patient alert and orientedx4. Patient oriented to rehab unit. All questions answered and belongings at bedside.

## 2017-08-26 NOTE — Progress Notes (Signed)
Spoke with patient, discussed disposition options. Patient is agreeable to go to CIR. LM w IP Rehab coordinator Prescott Gum RN.

## 2017-08-26 NOTE — Progress Notes (Signed)
Patient ID: Arthur Burke, male   DOB: 05-Aug-1966, 52 y.o.   MRN: [id number] Patient without complaints, wound VA functioning well. S/P BKA

## 2017-08-26 NOTE — Progress Notes (Addendum)
Physical Therapy Treatment Patient Details Name: Arthur Burke MRN: [id number] DOB: 1965-09-11 Today's Date: 08/26/2017    History of Present Illness 52 y.o. male admitted on 08/21/17 for Infected L heel ulcer.  Dx with osteomyelitis of left calcaneal tuberosity and intraosseous abscess with surrounding cellulitis.  He is scheduled to undergo a L BKA on 08/23/17.  Pt with other significant PMH of DM1, kidney transplant, diabetic retinopathy and neuropathy, MRSA, HTN, and multiple left foot amputations.  Now s/p L BKA    PT Comments    Patient seen for mobility progression s/p amputation. Patient eager and motivated to progress. Session focused on mobility and education/ exercise training for residual limb. Patient is able to ambulate with RW very short distance approximately across the room (20') with min guard, but then demonstrates bilateral UE fatigue requiring increased physical assist to maintain mobility.  Patient attempted to perform basic functional tasks in standing at counter but currently demonstrates deficits in balance impeding ability to relinquish UE support safely to complete tasks, hands on assist required for SLS. Patient receptive to education regarding positioning and activities for desensitization.  At this time, continue to feel patient would benefit from comprehensive therapies to maximize endurance, safety and independence upon return home. Would benefit from education, mobility training, and further limb management techniques to ensure success with residual limb and eventual transition to prosthesis.   Follow Up Recommendations  CIR     Equipment Recommendations  Wheelchair (measurements PT);Wheelchair cushion (measurements PT);Other (comment)(16x16 with amputee leg attachment L)    Recommendations for Other Services Rehab consult     Precautions / Restrictions Precautions Precautions: Fall Precaution Comments: wound Vac in place Restrictions Weight Bearing  Restrictions: No    Mobility  Bed Mobility Overal bed mobility: Modified Independent             General bed mobility comments: increased time to perform  Transfers Overall transfer level: Needs assistance Equipment used: Rolling walker (2 wheeled) Transfers: Sit to/from Stand Sit to Stand: Min assist         General transfer comment: Min assist for stability during power up, VCs for positioning and hand placement. Improved control with descent   Ambulation/Gait Ambulation/Gait assistance: Min guard;Min assist Ambulation Distance (Feet): 35 Feet(min guard 31ft, min assist 15) Assistive device: Rolling walker (2 wheeled) Gait Pattern/deviations: Step-to pattern Gait velocity: decreased   General Gait Details: VCs for positioning with RW, min guard for inital 20 ft, however, patient with BUE fatigue for remaining 15 ft requiring min assist for stability and safety to prevent fall.    Stairs            Wheelchair Mobility    Modified Rankin (Stroke Patients Only)       Balance Overall balance assessment: Needs assistance Sitting-balance support: Feet supported;No upper extremity supported Sitting balance-Leahy Scale: Good     Standing balance support: Bilateral upper extremity supported Standing balance-Leahy Scale: Poor Standing balance comment: reliant on UE support, attempted to release RW for functional task performance with UEs, patient unable to maintain single leg stance without increased physical assist                            Cognition Arousal/Alertness: Awake/alert Behavior During Therapy: WFL for tasks assessed/performed Overall Cognitive Status: Within Functional Limits for tasks assessed  Exercises      General Comments General comments (skin integrity, edema, etc.): Quad sets LLE with 5 sec hold, desensitization activity      Pertinent Vitals/Pain Pain Assessment:  0-10 Pain Score: 5  Pain Location: LLE/ Residual limb Pain Descriptors / Indicators: Grimacing;Guarding;Discomfort;Sore Pain Intervention(s): Monitored during session    Home Living                      Prior Function            PT Goals (current goals can now be found in the care plan section) Acute Rehab PT Goals Patient Stated Goal: to get a prosthetic leg PT Goal Formulation: With patient Time For Goal Achievement: 08/29/17 Potential to Achieve Goals: Good Progress towards PT goals: Progressing toward goals    Frequency    Min 3X/week      PT Plan Current plan remains appropriate    Co-evaluation              AM-PAC PT "6 Clicks" Daily Activity  Outcome Measure  Difficulty turning over in bed (including adjusting bedclothes, sheets and blankets)?: None Difficulty moving from lying on back to sitting on the side of the bed? : A Little Difficulty sitting down on and standing up from a chair with arms (e.g., wheelchair, bedside commode, etc,.)?: A Little Help needed moving to and from a bed to chair (including a wheelchair)?: A Little Help needed walking in hospital room?: A Little Help needed climbing 3-5 steps with a railing? : A Lot 6 Click Score: 18    End of Session Equipment Utilized During Treatment: Gait belt Activity Tolerance: Patient tolerated treatment well;Patient limited by pain Patient left: in bed;with call bell/phone within reach Nurse Communication: Mobility status PT Visit Diagnosis: Difficulty in walking, not elsewhere classified (R26.2);Pain;Muscle weakness (generalized) (M62.81) Pain - Right/Left: Left Pain - part of body: Leg     Time: 3716-9678 PT Time Calculation (min) (ACUTE ONLY): 21 min  Charges:  $Gait Training: 8-22 mins                    G Codes:       Alben Deeds, PT DPT  Board Certified Neurologic Specialist [place] 08/26/2017, 10:23 AM

## 2017-08-26 NOTE — H&P (Signed)
Physical Medicine and Rehabilitation Admission H&P     Chief Complaint  Patient presents with  . Fever  . Chills  . Nausea  . Emesis  :  HPI: Arthur Burke is a 52 y.o. 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 Per chart review and patient, patient lives with mother. Independent prior to admission using a walker. One level home. His mother uses a walker for balance and still drives and she can assist but limited physically. Presented 08/21/2017 with gangrenous changes of the left foot, fever 100.9, WBC 18,000. MRI shows recurrent osteomyelitis extensive necrosis. Blood cultures 1 of 2 grew strep species with infectious disease consulted and placed on cefepime changed to vancomycin which was later discontinued 08/25/2017 Limb was not felt to be salvageable and underwent transtibial amputation 08/23/2017 per Dr. Sharol Given. Hospital course pain management with home dose of oxycodone resumed 50 mg every 4 hours and was also receiving intravenous Dilaudid. Hyponatremia 129-132. Remains on contact precautions for MRSA. Acute blood loss anemia 10.4 and monitored. Physical and therapy evaluations completed 08/24/2017 with recommendations of physical medicine rehabilitation consult. Patient was admitted for a comprehensive rehabilitation program  Review of Systems  Constitutional: Positive for fever. Negative for chills.  HENT: Negative for hearing loss.  Eyes: Negative for blurred vision and double vision.  Respiratory:  Occasional shortness of breath with exertion  Cardiovascular: Positive for leg swelling. Negative for chest pain and palpitations.  Gastrointestinal: Positive for constipation. Negative for nausea and vomiting.  Genitourinary:  Negative for dysuria, flank pain and hematuria.  Musculoskeletal: Positive for myalgias.  Skin: Negative for rash.  Neurological: Negative for seizures.  All other systems reviewed and are negative.       Past Medical History:  Diagnosis Date  . Diabetes mellitus without complication ([place])   . Diabetic foot infection ([place])   . Hypertension   . MRSA (methicillin resistant staph aureus) culture positive   . Renal disorder   . Vision impairment         Past Surgical History:  Procedure Laterality Date  . AMPUTATION Left 08/23/2017   Procedure: AMPUTATION BELOW KNEE; Surgeon: Newt Minion, MD; Location: [facility]; Service: Orthopedics; Laterality: Left;  . APPLICATION OF WOUND VAC Left 08/23/2017   Procedure: APPLICATION OF WOUND VAC; Surgeon: Newt Minion, MD; Location: [facility]; Service: Orthopedics; Laterality: Left;  . KIDNEY TRANSPLANT  2006   [facility]  . SKIN GRAFT     L diabetic foot wound with debriedment and skin grafting  . TOE AMPUTATION     R great and 2nd toe   History reviewed. No pertinent family history.  Social History: reports that has never smoked. he has never used smokeless tobacco. He reports that he does not drink alcohol. His drug history is not on file.  Allergies:       Allergies  Allergen Reactions  . Zolpidem Other (See Comments)    Other Reaction: CNS Disorder         Medications Prior to Admission  Medication Sig Dispense Refill  . amLODipine (NORVASC) 10 MG tablet Take 10 mg by mouth daily.    Marland Kitchen atorvastatin (LIPITOR) 40 MG tablet Take 40 mg by mouth every evening.    . Calcium 600-200 MG-UNIT tablet Take 1 tablet  by mouth daily.    . Calcium Ascorbate 500 MG TABS Take 500 mg by mouth daily.    . carvedilol (COREG) 25 MG tablet Take 25 mg by mouth 2 (two) times daily.    . cloNIDine (CATAPRES) 0.1 MG tablet Take 0.1 mg by mouth 2 (two) times daily.    . Insulin Glargine (LANTUS SOLOSTAR) 100 UNIT/ML Solostar Pen Inject 32 Units into the skin at  bedtime.    . insulin lispro (HUMALOG KWIKPEN) 100 UNIT/ML KiwkPen Inject 11-15 Units into the skin 3 (three) times daily with meals. Sliding scale    . Multiple Vitamins-Minerals (ZINC PO) Take 1 tablet by mouth every Monday, Wednesday, and Friday.    . mycophenolate (CELLCEPT) 500 MG tablet Take 500 mg by mouth 2 (two) times daily.    Marland Kitchen omeprazole (PRILOSEC) 20 MG capsule Take 20 mg by mouth 2 (two) times daily.    Marland Kitchen oxyCODONE (ROXICODONE) 15 MG immediate release tablet Take 15 mg by mouth every 4 (four) hours.    . predniSONE (DELTASONE) 5 MG tablet Take 5 mg by mouth daily.    . ramipril (ALTACE) 10 MG capsule Take 20 mg by mouth daily.    . tacrolimus (PROGRAF) 1 MG capsule Take 1 mg by mouth 2 (two) times daily.    . vitamin B-12 (CYANOCOBALAMIN) 500 MCG tablet Take 500 mcg by mouth daily.     Drug Regimen Review  Drug regimen was reviewed and remains appropriate with no significant issues identified  Home:  Home Living  Family/patient expects to be discharged to:: Private residence  Living Arrangements: Parent(mother )  Available Help at Discharge: Family, Available 24 hours/day  Type of Home: House  Home Access: Level entry  Home Layout: One level  Bathroom Shower/Tub: Administrator, Civil Service: Standard  Bathroom Accessibility: Yes(with walker sideways )  Home Equipment: Environmental consultant - 2 wheels, Environmental consultant - 4 wheels, Cane - single point, Careers adviser History:  Prior Function  Level of Independence: Independent with assistive device(s)  Comments: cane vs RW  Functional Status:  Mobility:  Bed Mobility  Overal bed mobility: Modified Independent  General bed mobility comments: increased time to complete, HOB elevated  Transfers  Overall transfer level: Needs assistance  Equipment used: Rolling walker (2 wheeled)  Transfers: Sit to/from Stand, [employer] Transfers  Sit to Stand: Min assist  Stand pivot transfers: Min assist  General transfer comment: assist to  rise and steady; verbal cues for safe hand placement; completed stand pivot EOB<>BSC  Ambulation/Gait  Ambulation/Gait assistance: Min guard  Ambulation Distance (Feet): 4 Feet(including pivot steps bed to chair)  Assistive device: Rolling walker (2 wheeled)  Gait Pattern/deviations: (small hop-steps)  General Gait Details: Cues for technique and for full support on RW with bil UEs; Reports feeling like his balance is off after amputation   ADL:  ADL  Overall ADL's : Needs assistance/impaired  Eating/Feeding: Sitting, Modified independent  Grooming: Set up, Sitting  Upper Body Bathing: Min guard, Sitting  Lower Body Bathing: Minimal assistance, Sit to/from stand  Lower Body Bathing Details (indicate cue type and reason): discussed option of utilizing lateral leans to complete task vs sit<>stand  Upper Body Dressing : Sitting, Set up  Lower Body Dressing: Minimal assistance, Sit to/from stand  Lower Body Dressing Details (indicate cue type and reason): Pt easily able to reach and adjust sock on RLE  Toilet Transfer: Minimal assistance, Stand-pivot, BSC, RW  Toileting- Clothing Manipulation and Hygiene: Minimal assistance, Sit  to/from stand  Functional mobility during ADLs: Minimal assistance, Rolling walker(for stand pivot )  General ADL Comments: education provided on desensitization techniques  Cognition:  Cognition  Overall Cognitive Status: Within Functional Limits for tasks assessed  Orientation Level: Oriented X4  Cognition  Arousal/Alertness: Awake/alert  Behavior During Therapy: WFL for tasks assessed/performed  Overall Cognitive Status: Within Functional Limits for tasks assessed  Physical Exam:  Blood pressure (!) 165/58, pulse (!) 57, temperature 97.9 F (36.6 C), temperature source Oral, resp. rate 18, height 5\' 11"  (1.803 m), weight 80.6 kg (177 lb 11.1 oz), SpO2 97 %.  Physical Exam  Vitals reviewed.  Constitutional: He appears well-developed.  HENT:  Head:  Normocephalic.  Eyes: EOM are normal. Right eye exhibits no discharge. Left eye exhibits no discharge.  Neck: Normal range of motion. Neck supple. No thyromegaly present.  Cardiovascular: Normal rate, regular rhythm and normal heart sounds.  Respiratory: Effort normal and breath sounds normal. No respiratory distress.  GI: Soft. Bowel sounds are normal. He exhibits no distension.  Skin. Left BKA amputation site is dressed with wound VAC in place. RLE intact without callus and breakdown.  Neurological: He is alert and oriented to person, place, and time.  Motor: B/l UE 5/5 proximal to distal RLE: HF:4+ to 5/5 prox to distal LLE: HF 3+/5 HF, KE 2-3/5.  Sensory decreased below ankle RLE. No sensory changes in UE's.  Cognitively intact.  Psych: pleasant and appropriate.  Lab Results Last 48 Hours  Imaging Results (Last 48 hours)     Medical Problem List and Plan:  1. Decreased functional mobility secondary to left BKA 08/23/2017  -admit to inpatient rehab  2. DVT Prophylaxis/Anticoagulation: SCDs right lower extremity  3. Pain Management/chronic pain: Oxycodone immediate release 15 mg every 4 hours as prior to admission, low dose oxycodone immediate release 5 mg every 4 hours as needed and Robaxin as needed. Will schedule gabapentin for phantom/neuropathic pain.  4. Mood: Provide  emotional support  5. Neuropsych: This patient is capable of making decisions on his own behalf.  6. Skin/Wound Care: Routine skin checks  7. Fluids/Electrolytes/Nutrition: Routine I&O's with follow-up chemistries  8. Hypertension. Coreg 25 mg twice a day, clonidine 0.1 mg twice a day, Norvasc 10 mg daily, Altace 20 mg daily  9. Diabetes mellitus peripheral neuropathy. Hemoglobin A1c 8.6. Lantus insulin 32 units daily, NovoLog 10 units 3 times a day with meals. Check blood sugars before meals and at bedtime. Diabetic teaching  10. Renal and pancreas transplant recipient. Continue home immunosuppressive meds  11. Acute blood loss anemia. Follow-up CBC  12. Constipation. Laxative assistance  13. Hyperlipidemia. Lipitor   Post Admission Physician Evaluation:  1. Functional deficits secondary to left BKA. 2. Patient is admitted to receive collaborative, interdisciplinary care between the physiatrist, rehab nursing staff, and therapy team. 3. Patient's level of medical complexity and substantial therapy needs in context of that medical necessity cannot be provided at a lesser intensity of care such as a SNF. 4. Patient has experienced substantial functional loss from his/her baseline which was documented above under the "Functional History" and "Functional Status" headings. Judging by the patient's diagnosis, physical exam, and functional history, the patient has potential for functional progress which will result in measurable gains while on inpatient rehab. These gains will be of substantial and practical use upon discharge in facilitating mobility and self-care at the household level. 5. Physiatrist will provide 24 hour management of medical needs as well as oversight of the therapy plan/treatment and provide guidance as appropriate regarding the interaction of the two. 6. The Preadmission Screening has been reviewed and patient status is unchanged unless otherwise stated above. 7. 24 hour rehab  nursing will assist with bladder management, bowel management, safety, skin/wound care, disease management, medication administration, pain management and patient education and help integrate therapy concepts, techniques,education, etc. 8. PT will assess and treat for/with: Lower extremity strength, range of motion, stamina, balance, functional mobility, safety, adaptive techniques and equipment, NMR, pre-prosthetic ed, pain control. Goals are: mod I. 9. OT will assess and treat for/with: ADL's, functional mobility, safety, upper extremity strength, adaptive techniques and equipment, pre-prosthetic education, pain control, community reentry. Goals are: mod I. Therapy may proceed with showering this patient. 10. SLP will assess and treat for/with: n/a. Goals are: n/a. 11. Case Management and Social Worker will assess and treat for psychological issues and discharge planning. 12. Team conference will be held weekly to assess progress toward goals and to determine barriers to discharge. 13. Patient will receive at least 3 hours of therapy per day at least 5 days per week. 14. ELOS: 7 days  15. Prognosis: excellent Meredith Staggers, MD, [facility]  08/26/2017  Lavon Paganini [place], PA-C  08/25/2017

## 2017-08-26 NOTE — Progress Notes (Signed)
[facility] PHYSICAL MEDICINE & REHABILITATION     PROGRESS NOTE  Subjective/Complaints:  Pt seen sitting up in bed this AM.  He states she slept fairly, but it is improving.  He notes pain overnight.  ROS: +LLE pain. Denies CP, SOB, N/V/D.  Objective: Vital Signs: Blood pressure 121/63, pulse (!) 59, temperature 97.6 F (36.4 C), temperature source Oral, resp. rate 20, height 5\' 11"  (1.803 m), weight 78.8 kg (173 lb 11.6 oz), SpO2 98 %. No results found. Recent Labs    08/25/17 0628 08/26/17 0646  WBC 12.7* 14.1*  HGB 10.6* 10.4*  HCT 32.4* 31.6*  PLT 323 315   Recent Labs    08/25/17 0628 08/26/17 0646  NA 131* 129*  K 4.5 4.8  CL 97* 95*  GLUCOSE 136* 311*  BUN 22* 21*  CREATININE 0.77 1.17  CALCIUM 9.1 8.9   CBG (last 3)  Recent Labs    08/26/17 1558 08/26/17 1649 08/26/17 2047  GLUCAP 68 103* 304*    Wt Readings from Last 3 Encounters:  08/26/17 78.8 kg (173 lb 11.6 oz)  08/21/17 80.6 kg (177 lb 11.1 oz)    Physical Exam:  BP 121/63 (BP Location: Right Arm)   Pulse (!) 59   Temp 97.6 F (36.4 C) (Oral)   Resp 20   Ht 5\' 11"  (1.803 m)   Wt 78.8 kg (173 lb 11.6 oz)   SpO2 98%   BMI 24.23 kg/m  Constitutional: He appears well-developed. NAD. HENT: Normocephalic. Atraumatic.  Eyes: EOM are normal. No discharge.  Cardiovascular: Normal rate, regular rhythm. No JVD Respiratory: Effort normal and breath sounds normal. GI: Bowel sounds are normal. He exhibits no distension.  Neurological: He is alert and oriented.  Motor: B/l UE 5/5 proximal to distal RLE: HF:4+ to 5/5 prox to distal LLE: HF 3+/5 HF, KE 2-3/5 (pain inhibition).  Skin. 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  Psych: pleasant and appropriate.  Assessment/Plan: 1. Functional deficits secondary to left BKA which require 3+ hours per day of interdisciplinary therapy in a comprehensive inpatient rehab setting. Physiatrist is providing close team supervision and 24 hour  management of active medical problems listed below. Physiatrist and rehab team continue to assess barriers to discharge/monitor patient progress toward functional and medical goals.  Function:  Bathing Bathing position      Bathing parts      Bathing assist        Upper Body Dressing/Undressing Upper body dressing                    Upper body assist        Lower Body Dressing/Undressing Lower body dressing                                  Lower body assist        Toileting Toileting          Toileting assist     Transfers Chair/bed transfer             Locomotion Ambulation           Wheelchair          Cognition Comprehension    Expression    Social Interaction    Problem Solving    Memory      Medical Problem List and Plan:  1. Decreased functional mobility secondary to left BKA 08/23/2017  Begin CIR 2. DVT Prophylaxis/Anticoagulation: SCDs right lower extremity  3. Pain Management/chronic pain: Oxycodone immediate release 15 mg every 4 hours as prior to admission, low dose oxycodone immediate release 5 mg every 4 hours as needed and Robaxin as needed. Scheduled gabapentin for phantom/neuropathic pain.  4. Mood: Provide emotional support  5. Neuropsych: This patient is capable of making decisions on his own behalf.  6. Skin/Wound Care: Routine skin checks  7. Fluids/Electrolytes/Nutrition: Routine I&O's  8. Hypertension. Coreg 25 mg twice a day, clonidine 0.1 mg twice a day, Norvasc 10 mg daily, Altace 20 mg daily    Monitor with increased mobility, labile at present 9. Diabetes mellitus peripheral neuropathy. Hemoglobin A1c 8.6. Lantus insulin 32 units daily, NovoLog 10 units 3 times a day with meals. Check blood sugars before meals and at bedtime. Diabetic teaching    Monitor with increased mobility 10. Renal and pancreas transplant recipient. Continue home immunosuppressive meds    With AKI, Cr 1.26 on 1/16   Cont to  monitor 11. Acute blood loss anemia.    Hb 11.1 on 1/16   Cont to monitor 12. Constipation. Laxative assistance  13. Hyperlipidemia. Lipitor  14. Hyponatremia   Na 133 on 1/16   Cont to monitor 15. Leukocytosis   WBCs 14.9 on 1/16   Afebrile   Cont to monitor  LOS (Days) 0 A FACE TO FACE EVALUATION WAS PERFORMED  Ankit Lorie Phenix 08/26/2017 9:07 PM

## 2017-08-26 NOTE — Progress Notes (Signed)
Subjective: * No acute events overnight. Pain improving on current regimen. Discussed with patient no further need for antibiotic therapy as very low concern for ongoing systemic infection at this time. Also dicussed importance of working PT in order to move towards discharge. Patient voiced understanding and is in agreement with plan. All questions answered.   Objective:  Vital signs in last 24 hours: Vitals:   08/25/17 0758 08/25/17 1632 08/25/17 2013 08/26/17 0414  BP: (!) 165/58 122/64 (!) 146/69 103/63  Pulse: (!) 57 64 65 61  Resp: 18 18 16 16   Temp: 97.9 F (36.6 C) 97.6 F (36.4 C) 97.7 F (36.5 C) (!) 97.5 F (36.4 C)  TempSrc: Oral Oral  Oral  SpO2: 97% 96% 96% 98%  Weight:      Height:       General: pleasant male with flat affect who is sitting up in bed in no acute distress = Cardiac: regular rate and rhythm, nl S1/S2, no murmurs, rubs or gallops  Pulm: CTAB, no wheezes or crackles, no increased work of breathing  Abd: soft, NTND, bowel sounds present Ext: LLE s/p BKA, stump healing well, wound vac in place with no discharge, no signs of infection noted    Assessment/Plan:  Principal Problem:   Acute osteomyelitis of left calcaneus (HCC) Active Problems:   Lower limb ulcer, ankle, left, with necrosis of muscle (HCC)   Normocytic anemia   Infected ulcer of skin, with fat layer exposed ([place])   Renal transplant recipient   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 Surgical wound healing well. No signs of infection noted on exam. Pain improved today. PT recommended CIR who will accept patient today.  - Ortho following, appreciate recommendations  - Continue scheduled oxy 15 mg q4h + IV Dilaudid 1 mg q2h PRN  - [employer] arranging for stump shrinker  - Continue PT  - CM consulted and following  # Diabetes type 1 complicated by atherosclerosis of arteries of extremities and neuropathy CBGs remain labile likely in the setting of inflammatory stress from recent infection and surgery as well as chronic steroid use.  - Continue Lantus 32U QHS  - Continue Novolog 10 units TID qAC - Continue additional SSI- M  # Streptococcal bacteremia: Blood cultures 1/10 positive for Streptococcus viridians 1/4 bottles sensitive to levofloxacin and vancomycin. Repeat Bcx negative at 72h. Remains afebrile without systemic signs/ symptoms of infection. Per ID, no further need for antibiotic therapy as this is likely a contaminant, has already received 5 days of vancomycin.  - ID signed off  - Discontinue vancomycin 1/10-1/14 >> - Discontinued cefepime 1/11 > 1/12  # Normocytic anemia: No active signs/symptoms of bleeding. Hgb 10.4 this AM. Iron studies normal. Likely secondary to acute blood loss from recent surgery.  - Continue to monitor   # Renal and pancreas transplant recipient in 2005 secondary to T1DM and HTN  - Continue home immunosupresive meds - tacrolimus, prednisone, and mycophenolate  Dispo: Anticipated discharge today to CIR.   Welford Roche, MD 08/26/2017, 8:55 AM Pager: [phone]

## 2017-08-26 NOTE — Progress Notes (Signed)
Medicine attending discharge note: I personally examined this patient on the day of discharge and I attest to the accuracy of the discharge evaluation and plan as recorded in the final progress note by resident physician Dr. Isac Sarna.  52 year old type 1 insulin-dependent diabetic since age 89 with multiple diabetic complications including retinopathy, nephropathy status post renal transplant on chronic immunosuppressive agents, peripheral neuropathy, and peripheral vascular disease status post previous amputation of 2 toes on his left foot for infected ulcers and osteomyelitis. He presented at this time with a large ulcer on the left heel with x-ray suspicion for osteomyelitis.  He was seen in consultation by orthopedic surgery.  He underwent an uncomplicated transtibial amputation on August 23, 2017. Postoperative pain slowly improving.  Wound dressing and wound VAC in place. Blood sugars monitored and insulin adjusted as needed. 1 out of multiple blood cultures grew strep viridans.  Per discussion with infectious disease consultant, likely transient bacteremia and does not necessitate long-term antibiotics. Of note: No osteomyelitis was noted on the final pathology from the amputated limb.   He had low-grade fevers prior to amputation.  On antibiotics and subsequent amputation he has been afebrile.  Temperature 97.5 at discharge. Renal function remains stable on his current immunosuppressives; BUN 21 creatinine 1.2 on January 15. No other acute problems occurred during this admission.  Disposition: Condition stable at time of discharge He will be readmitted on the same day to [facility] inpatient rehabilitation service There were no complications

## 2017-08-26 NOTE — Progress Notes (Signed)
Inpatient Diabetes Program Recommendations  AACE/ADA: New Consensus Statement on Inpatient Glycemic Control (2015)  Target Ranges:  Prepandial:   less than 140 mg/dL      Peak postprandial:   less than 180 mg/dL (1-2 hours)      Critically ill patients:  140 - 180 mg/dL   Results for CANUTO, KINGSTON (MRN [id number]) as of 08/26/2017 10:49  Ref. Range 08/25/2017 07:55 08/25/2017 12:03 08/25/2017 15:29 08/25/2017 15:51 08/25/2017 15:57 08/25/2017 16:29 08/25/2017 20:06 08/26/2017 08:01  Glucose-Capillary Latest Ref Range: 65 - 99 mg/dL 128 (H) 164 (H) 40 (LL) 49 (L) 56 (L) 89 292 (H) 271 (H)   Review of Glycemic Control  Diabetes history: DM 1 Outpatient Diabetes medications: Lantus 32 units, Humalog 11-15 units tid with meals Current orders for Inpatient glycemic control: Lantus 32 units, Novolog Moderate Correction 0-15 units tid + Novolog 10 units tid meal coverage  Inpatient Diabetes Program Recommendations:    Patient with hypoglycemia yesterday at 40 mg/dl. Patient received Novolog Correction scale and meal coverage together. However patient only ate 25% of meal.  Please make sure the following parameters are on the Novolog Meal Coverage order: Give if patient eats at least 50% of meal and Glucose is at least 80 mg/dl.  Thanks,  Tama Headings RN, MSN, [facility] Inpatient Diabetes Coordinator Team Pager [phone] (8a-5p)

## 2017-08-27 ENCOUNTER — Inpatient Hospital Stay (HOSPITAL_COMMUNITY): Payer: Medicare Other | Admitting: Occupational Therapy

## 2017-08-27 ENCOUNTER — Inpatient Hospital Stay (HOSPITAL_COMMUNITY): Payer: Medicare Other

## 2017-08-27 DIAGNOSIS — D62 Acute posthemorrhagic anemia: Secondary | ICD-10-CM

## 2017-08-27 DIAGNOSIS — D72829 Elevated white blood cell count, unspecified: Secondary | ICD-10-CM

## 2017-08-27 DIAGNOSIS — I10 Essential (primary) hypertension: Secondary | ICD-10-CM

## 2017-08-27 DIAGNOSIS — N179 Acute kidney failure, unspecified: Secondary | ICD-10-CM

## 2017-08-27 DIAGNOSIS — E1142 Type 2 diabetes mellitus with diabetic polyneuropathy: Secondary | ICD-10-CM

## 2017-08-27 DIAGNOSIS — S88112S Complete traumatic amputation at level between knee and ankle, left lower leg, sequela: Secondary | ICD-10-CM

## 2017-08-27 DIAGNOSIS — E871 Hypo-osmolality and hyponatremia: Secondary | ICD-10-CM

## 2017-08-27 LAB — COMPREHENSIVE METABOLIC PANEL
ALBUMIN: 2.5 g/dL — AB (ref 3.5–5.0)
ALK PHOS: 108 U/L (ref 38–126)
ALT: 12 U/L — AB (ref 17–63)
AST: 16 U/L (ref 15–41)
Anion gap: 7 (ref 5–15)
BILIRUBIN TOTAL: 0.8 mg/dL (ref 0.3–1.2)
BUN: 24 mg/dL — AB (ref 6–20)
CALCIUM: 9.2 mg/dL (ref 8.9–10.3)
CO2: 26 mmol/L (ref 22–32)
CREATININE: 1.26 mg/dL — AB (ref 0.61–1.24)
Chloride: 100 mmol/L — ABNORMAL LOW (ref 101–111)
GFR calc Af Amer: >60 mL/min (ref >60)
GFR calc non Af Amer: >60 mL/min (ref >60)
GLUCOSE: 214 mg/dL — AB (ref 65–99)
Potassium: 4.9 mmol/L (ref 3.5–5.1)
SODIUM: 133 mmol/L — AB (ref 135–145)
Total Protein: 6.5 g/dL (ref 6.5–8.1)

## 2017-08-27 LAB — CBC WITH DIFFERENTIAL/PLATELET
BASOS ABS: 0 10*3/uL (ref 0.0–0.1)
BASOS PCT: 0 %
EOS ABS: 0.3 10*3/uL (ref 0.0–0.7)
Eosinophils Relative: 2 %
HCT: 33 % — ABNORMAL LOW (ref 39.0–52.0)
HEMOGLOBIN: 11.1 g/dL — AB (ref 13.0–17.0)
LYMPHS PCT: 42 %
Lymphs Abs: 6.3 10*3/uL — ABNORMAL HIGH (ref 0.7–4.0)
MCH: 28.7 pg (ref 26.0–34.0)
MCHC: 33.6 g/dL (ref 30.0–36.0)
MCV: 85.3 fL (ref 78.0–100.0)
Monocytes Absolute: 0.9 10*3/uL (ref 0.1–1.0)
Monocytes Relative: 6 %
NEUTROS PCT: 50 %
Neutro Abs: 7.4 10*3/uL (ref 1.7–7.7)
Platelets: 361 10*3/uL (ref 150–400)
RBC: 3.87 MIL/uL — ABNORMAL LOW (ref 4.22–5.81)
RDW: 13.1 % (ref 11.5–15.5)
WBC: 14.9 10*3/uL — ABNORMAL HIGH (ref 4.0–10.5)

## 2017-08-27 LAB — GLUCOSE, CAPILLARY
Glucose-Capillary: 186 mg/dL — ABNORMAL HIGH (ref 65–99)
Glucose-Capillary: 127 mg/dL — ABNORMAL HIGH (ref 65–99)
Glucose-Capillary: 72 mg/dL (ref 65–99)
Glucose-Capillary: 190 mg/dL — ABNORMAL HIGH (ref 65–99)

## 2017-08-27 NOTE — Progress Notes (Signed)
Occupational Therapy Session Note  Patient Details  Name: TIBURCIO LINDER MRN: [id number] Date of Birth: 1966/07/05  Today's Date: 08/27/2017 OT Individual Time: 1302-1400 OT Individual Time Calculation (min): 58 min    Short Term Goals: Week 1:  OT Short Term Goal 1 (Week 1): STG=LTG  Skilled Therapeutic Interventions/Progress Updates:  Pt transferred from supine to sit EOB with supervision.  He was then able to transfer squat pivot to the wheelchair from EOB.  Worked on wheelchair mobility with supervision to the therapy gym.  Once in the gym he needed supervision and min instructional cueing for use of the UE Ergonometer.  He was able to complete 2 sets of 4 mins each with resistance at level 8 for the first set and level 10 for the second set.  He maintained RPMs at 21-25 per set.  Returned to the room at end of session with pt left sitting in the wheelchair with call button and phone in reach.  Notified nursing at end of session for transfer back to bed per pt request.     Therapy Documentation Precautions:  Precautions Precautions: Fall Precaution Comments: wound Vac in place Restrictions Weight Bearing Restrictions: Yes LLE Weight Bearing: Non weight bearing  Pain: Pain Assessment Pain Assessment: 0-10 Pain Score: 6  Pain Type: Surgical pain Pain Location: Leg Pain Orientation: Left Pain Intervention(s): Medication (See eMAR) 2nd Pain Site Pain Location: Abdomen Pain Frequency: Intermittent ADL: See Function Navigator for Current Functional Status.   Therapy/Group: Individual Therapy  Haizlee Henton OTR/L 08/27/2017, 2:11 PM

## 2017-08-27 NOTE — Progress Notes (Signed)
Patient information reviewed and entered into eRehab system by Gilverto Dileonardo, RN, CRRN, PPS Coordinator.  Information including medical coding and functional independence measure will be reviewed and updated through discharge.     Per nursing patient was given "Data Collection Information Summary for Patients in Inpatient Rehabilitation Facilities with attached "Privacy Act Statement-Health Care Records" upon admission.  

## 2017-08-27 NOTE — Patient Care Conference (Signed)
Inpatient RehabilitationTeam Conference and Plan of Care Update Date: 08/27/2017   Time: 2:30 PM    Patient Name: Arthur Burke      Medical Record Number: [id number]  Date of Birth: Mar 17, 1966 Sex: Male         Room/Bed: 4M04C/4M04C-01 Payor Info: Payor: MEDICARE / Plan: MEDICARE PART A AND B / Product Type: *No Product type* /    Admitting Diagnosis: L BKA  Admit Date/Time:  08/26/2017  4:24 PM Admission Comments: No comment available   Primary Diagnosis:  <principal problem not specified> Principal Problem: <principal problem not specified>  Patient Active Problem List   Diagnosis Date Noted  . Unilateral complete BKA, left, sequela ([place])   . Leukocytosis   . AKI (acute kidney injury) ([place])   . Diabetic peripheral neuropathy associated with type 2 diabetes mellitus ([place])   . Amputation of left lower extremity below knee ([place]) 08/26/2017  . Anemia in chronic kidney disease   . Unilateral complete BKA, left, subsequent encounter ([place])   . Post-operative pain   . Stage 3 chronic kidney disease ([place])   . Hyponatremia   . Benign essential HTN   . Acute blood loss anemia   . S/P BKA (below knee amputation) unilateral, left ([place]) 08/23/2017  . Streptococcal bacteremia 08/23/2017  . Acute osteomyelitis of left calcaneus (HCC)   . Lower limb ulcer, ankle, left, with necrosis of muscle ([place])   . Normocytic anemia   . Infected ulcer of skin, with fat layer exposed ([place])   . Renal transplant recipient   . 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 [facility])     Expected Discharge Date: Expected Discharge Date: 09/02/17  Team Members Present: Physician leading conference: Dr. Delice Lesch Social Worker Present: Ovidio Kin, LCSW Nurse Present: Dorien Chihuahua, RN PT Present: Michaelene Song, PT OT Present: Clyda Greener, OT SLP Present: Windell Moulding, SLP PPS Coordinator present : Daiva Nakayama, RN,  CRRN     Current Status/Progress Goal Weekly Team Focus  Medical   Decreased functional mobility secondary to left BKA 08/23/2017   Improve mobility, self-care, transfers, BP, DM, AKI, leukocytosis  See above   Bowel/Bladder   Pt uses bedside commode, continent of b/b. LBM 08/26/17   Remain continent of b/b no s/sx of constipation administer laxatives prn  assess for b/b function treat constipation prn   Swallow/Nutrition/ Hydration             ADL's   supervision for UB selfcare, min assist for LB selfcare and for functional transfers  modified independent  selfcare retraining, transfer training, balance retraining, pt/family education   Mobility   supervision bed mobility and w/c propulsion, min assist transfers and gait with RW 25 ft  supervision- Mod I  transfers, gait, LE strength and flexibility, endurance, w/c propulsion   Communication             Safety/Cognition/ Behavioral Observations  Alert and oriented x4, 1 assist with RW, using BSC   remains with normal cognition free from injury or falls  assess cognition qshift remains free from falls and injury   Pain   Pt has chronic pain in abdomen, legs, back receives oxycodone IR 15mg  Q4H ATC, and also oxycodone IR 5mg  Q4H prn with relief  pts stated pain will be <=3/10  pt will have pain that is <=3 assess pain Q4H and prn medicate as needed and monitor for  relief notify MD is pain not controlled   Skin   Wound vac treatment to Left BKA, no signs symptoms of infection, no skin breakdown  Continue wound vac as ordered, skin will remain free of infection or breakdown  Assess wound and skin qs       *See Care Plan and progress notes for long and short-term goals.     Barriers to Discharge  Current Status/Progress Possible Resolutions Date Resolved   Physician    Medical stability;Weight bearing restrictions;Wound Care     See above  Therapies, optimize BP/DM meds, follow labs, VAC d/c when appropriate      Nursing  Wound  Care               PT                    OT                  SLP                SW Decreased caregiver support Mom has health issues and can offer physical assist            Discharge Planning/Teaching Needs:    Home with mom who can provide supervision level only due to her health issues.     Team Discussion:  Goals mod/i wheelchair and supervision-ambulation. Vac hopefully discharged Friday, MRSA precautions discharged. MD watching HTN, diabetes and labs. DC saline lock. New eval today. MD managing pain meds-pt having much pain and MD plans to decrease preparing for home  Revisions to Treatment Plan:  DC 1/22- new evaluation    Continued Need for Acute Rehabilitation Level of Care: The patient requires daily medical management by a physician with specialized training in physical medicine and rehabilitation for the following conditions: Daily direction of a multidisciplinary physical rehabilitation program to ensure safe treatment while eliciting the highest outcome that is of practical value to the patient.: Yes Daily medical management of patient stability for increased activity during participation in an intensive rehabilitation regime.: Yes Daily analysis of laboratory values and/or radiology reports with any subsequent need for medication adjustment of medical intervention for : Post surgical problems;Diabetes problems;Wound care problems;Blood pressure problems;Other  [name] 08/27/2017, 3:36 PM

## 2017-08-27 NOTE — Evaluation (Signed)
Physical Therapy Assessment and Plan  Patient Details  Name: Arthur Burke MRN: [id number] Date of Birth: [birth year]  PT Diagnosis: Difficulty walking, Muscle weakness and Pain in L LE and LBP Rehab Potential: Good ELOS: 5-7 days   Today's Date: 08/27/2017 PT Individual Time: 0900-1000 PT Individual Time Calculation (min): 60 min    Problem List:  Patient Active Problem List   Diagnosis Date Noted  . Unilateral complete BKA, left, sequela ([place])   . Leukocytosis   . AKI (acute kidney injury) ([place])   . Diabetic peripheral neuropathy associated with type 2 diabetes mellitus ([place])   . Amputation of left lower extremity below knee ([place]) 08/26/2017  . Anemia in chronic kidney disease   . Unilateral complete BKA, left, subsequent encounter ([place])   . Post-operative pain   . Stage 3 chronic kidney disease ([place])   . Hyponatremia   . Benign essential HTN   . Acute blood loss anemia   . S/P BKA (below knee amputation) unilateral, left ([place]) 08/23/2017  . Streptococcal bacteremia 08/23/2017  . Acute osteomyelitis of left calcaneus (HCC)   . Lower limb ulcer, ankle, left, with necrosis of muscle ([place])   . Normocytic anemia   . Infected ulcer of skin, with fat layer exposed ([place])   . Renal transplant recipient   . 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 ([place])     Past Medical History:  Past Medical History:  Diagnosis Date  . Diabetes mellitus without complication ([place])   . Diabetic foot infection ([place])   . Hypertension   . MRSA (methicillin resistant staph aureus) culture positive   . Renal disorder   . Vision impairment    Past Surgical History:  Past Surgical History:  Procedure Laterality Date  . AMPUTATION Left 08/23/2017   Procedure: AMPUTATION BELOW KNEE;  Surgeon: Newt Minion, MD;  Location: [facility];  Service: Orthopedics;  Laterality: Left;  . APPLICATION OF WOUND VAC Left  08/23/2017   Procedure: APPLICATION OF WOUND VAC;  Surgeon: Newt Minion, MD;  Location: [facility];  Service: Orthopedics;  Laterality: Left;  . KIDNEY TRANSPLANT  2006   [facility]  . SKIN GRAFT     L diabetic foot wound with debriedment and skin grafting  . TOE AMPUTATION     R great and 2nd toe    Assessment & Plan Clinical Impression: Patient is a 52 y.o. 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 Per chart review and patient, patient lives with mother. Independent prior to admission using a walker. One level home. His mother uses a walker for balance and still drives and she can assist but limited physically. Presented 08/21/2017 with gangrenous changes of the left foot, fever 100.9, WBC 18,000. MRI shows recurrent osteomyelitis extensive necrosis. Blood cultures 1 of 2 grew strep species with infectious disease consulted and placed on cefepime changed to vancomycin which was later discontinued 08/25/2017 Limb was not felt to be salvageable and underwent transtibial amputation 08/23/2017 per Dr. Sharol Given. Hospital course pain management with home dose of oxycodone resumed 50 mg every 4 hours and was also receiving intravenous Dilaudid. Hyponatremia 129-132. Remains on contact precautions for MRSA. Acute blood loss anemia 10.4  and monitored. Physical and therapy evaluations completed 08/24/2017 with recommendations of physical medicine rehabilitation consult. Patient was admitted for a comprehensive rehabilitation program. Patient transferred to CIR on 08/26/2017 .   Patient currently requires min with mobility secondary to muscle weakness and decreased standing balance and decreased balance strategies.  Prior to hospitalization, patient was modified  independent  with mobility and lived with alone   in a House home.  Home access is  Level entry.  Patient will benefit from skilled PT intervention to maximize safe functional mobility, minimize fall risk and decrease caregiver burden for planned discharge intermittent supervision.  Anticipate patient will benefit from follow up [place] at discharge.  PT - End of Session Activity Tolerance: Tolerates 30+ min activity with multiple rests Endurance Deficit: Yes PT Assessment Rehab Potential (ACUTE/IP ONLY): Good PT Patient demonstrates impairments in the following area(s): Balance;Edema;Endurance;Motor;Pain;Skin Integrity PT Transfers Functional Problem(s): Bed Mobility;Bed to Chair;Car;Furniture;Floor PT Locomotion Functional Problem(s): Ambulation;Wheelchair Mobility;Stairs PT Plan PT Intensity: Minimum of 1-2 x/day ,45 to 90 minutes PT Frequency: 5 out of 7 days PT Duration Estimated Length of Stay: 5-7 days PT Treatment/Interventions: Ambulation/gait training;Discharge planning;Functional mobility training;Therapeutic Activities;Balance/vestibular training;Disease management/prevention;Therapeutic Exercise;Wheelchair propulsion/positioning;Neuromuscular re-education;Pain management;UE/LE Strength taining/ROM;DME/adaptive equipment instruction;Community reintegration;Stair training;Patient/family education;UE/LE Coordination activities PT Transfers Anticipated Outcome(s): Mod I PT Locomotion Anticipated Outcome(s): supervision PT Recommendation Follow Up Recommendations: Home health PT Patient destination: Home Equipment Recommended: To be determined Equipment Details: pt has RW already  Skilled Therapeutic Intervention Evaluation completed (see details above and below) with education on PT POC and goals and individual treatment initiated with focus on functional mobility, limb desensitization and safety. Pt transferred from supine>sitting EOB with supervision. Pt performed lower body dressing  from EOB with set up, sit<>stand with min assist and RW to pull pants over hips. Pt transferred from bed>w/c with min assist, squat pivot. Pt propelled w/c throughout unit >150 ft with supervision using B UEs, w/c gloves provided. Pt transferred from w/c<>car with min assist, stand pivot using RW. Therapist provided amputee pad on L with towels to allow for improved knee extension and comfort. Pt ambulated x 25 ft using RW and min assist, verbal cues for technique with emphasis on swing through rather than hopping. Pt propelled back to room and left seated in w/c with needs in reach.   PT Evaluation Precautions/Restrictions Precautions Precautions: Fall Precaution Comments: wound Vac in place Restrictions Weight Bearing Restrictions: No LLE Weight Bearing: Non weight bearing Pain Pain Assessment Pain Assessment: 0-10 Pain Score: 2  Faces Pain Scale: Hurts a little bit Pain Type: Surgical pain Pain Location: Leg Pain Intervention(s): Repositioned 2nd Pain Site Pain Location: Abdomen Pain Frequency: Intermittent Pt reports pain 4/10 in L LE at rest Home Living/Prior Functioning Home Living Available Help at Discharge: Family;Available 24 hours/day Type of Home: House Home Access: Level entry Home Layout: One level Bathroom Shower/Tub: Chiropodist: Standard Prior Function Level of Independence: Requires assistive device for independence  Able to Take Stairs?: No Driving: No Vocation: On disability Leisure: Hobbies-yes (Comment) Comments: Pt already has RW. Likes to get on the computer and listen to music, used to play the drums.  Cognition Overall Cognitive Status: Within Functional Limits for tasks assessed Arousal/Alertness: Awake/alert Orientation Level: Oriented X4 Attention: Sustained;Focused Focused Attention: Appears intact Sustained Attention: Appears intact Memory: Appears intact Awareness: Appears intact Problem Solving: Appears  intact Safety/Judgment: Appears intact Sensation Sensation Light Touch: Appears Intact Stereognosis: Not tested Hot/Cold: Not tested Proprioception: Appears Intact Additional Comments: grossly intact B  LEs Coordination Gross Motor Movements are Fluid and Coordinated: No Fine Motor Movements are Fluid and Coordinated: Yes Coordination and Movement Description: coordination impaired in B LEs secondary to pain and L BKA Motor  Motor Motor: Within Functional Limits Motor - Skilled Clinical Observations: generalized weakness and acute pain  Trunk/Postural Assessment  Cervical Assessment Cervical Assessment: Within Functional Limits Thoracic Assessment Thoracic Assessment: Within Functional Limits Lumbar Assessment Lumbar Assessment: Within Functional Limits Postural Control Postural Control: Within Functional Limits  Balance Balance Balance Assessed: Yes Static Sitting Balance Static Sitting - Level of Assistance: 5: Stand by assistance Dynamic Sitting Balance Dynamic Sitting - Level of Assistance: 5: Stand by assistance Static Standing Balance Static Standing - Level of Assistance: 4: Min assist Dynamic Standing Balance Dynamic Standing - Level of Assistance: 4: Min assist Extremity Assessment  RLE Assessment RLE Assessment: Within Functional Limits LLE Assessment LLE Assessment: Exceptions to WFL(grossly 4/5 throughout at hip/knee, limited knee ROM )   See Function Navigator for Current Functional Status.   Refer to Care Plan for Long Term Goals  Recommendations for other services: None   Discharge Criteria: Patient will be discharged from PT if patient refuses treatment 3 consecutive times without medical reason, if treatment goals not met, if there is a change in medical status, if patient makes no progress towards goals or if patient is discharged from hospital.  The above assessment, treatment plan, treatment alternatives and goals were discussed and mutually  agreed upon: by patient  Netta Corrigan, PT, DPT 08/27/2017, 12:59 PM

## 2017-08-27 NOTE — Progress Notes (Signed)
Social Work   Arthur Burke, Arthur Burke  Social Worker  Physical Medicine and Rehabilitation  Patient Care Conference  Signed  Date of Service:  08/27/2017  3:36 PM          Signed          [] Hide copied text  [] Hover for details   Inpatient RehabilitationTeam Conference and Plan of Care Update Date: 08/27/2017   Time: 2:30 PM      Patient Name: Arthur Burke      Medical Record Number: [id number]  Date of Birth: Aug 23, 1965 Sex: Male         Room/Bed: 4M04C/4M04C-01 Payor Info: Payor: MEDICARE / Plan: MEDICARE PART A AND B / Product Type: *No Product type* /     Admitting Diagnosis: L BKA  Admit Date/Time:  08/26/2017  4:24 PM Admission Comments: No comment available    Primary Diagnosis:  <principal problem not specified> Principal Problem: <principal problem not specified>       Patient Active Problem List    Diagnosis Date Noted  . Unilateral complete BKA, left, sequela ([place])    . Leukocytosis    . AKI (acute kidney injury) ([place])    . Diabetic peripheral neuropathy associated with type 2 diabetes mellitus ([place])    . Amputation of left lower extremity below knee ([place]) 08/26/2017  . Anemia in chronic kidney disease    . Unilateral complete BKA, left, subsequent encounter ([place])    . Post-operative pain    . Stage 3 chronic kidney disease ([place])    . Hyponatremia    . Benign essential HTN    . Acute blood loss anemia    . S/P BKA (below knee amputation) unilateral, left ([place]) 08/23/2017  . Streptococcal bacteremia 08/23/2017  . Acute osteomyelitis of left calcaneus (HCC)    . Lower limb ulcer, ankle, left, with necrosis of muscle ([place])    . Normocytic anemia    . Infected ulcer of skin, with fat layer exposed ([place])    . Renal transplant recipient    . 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 [facility])        Expected Discharge Date: Expected Discharge Date: 09/02/17     Team Members Present: Physician leading conference: Dr. Delice Lesch Social Worker Present: Arthur Kin, LCSW Nurse Present: Arthur Chihuahua, RN PT Present: Arthur Burke, PT OT Present: Arthur Burke, OT SLP Present: Arthur Burke, SLP PPS Coordinator present : Arthur Nakayama, RN, CRRN       Current Status/Progress Goal Weekly Team Focus  Medical     Decreased functional mobility secondary to left BKA 08/23/2017   Improve mobility, self-care, transfers, BP, DM, AKI, leukocytosis  See above   Bowel/Bladder     Pt uses bedside commode, continent of b/b. LBM 08/26/17   Remain continent of b/b no s/sx of constipation administer laxatives prn  assess for b/b function treat constipation prn   Swallow/Nutrition/ Hydration               ADL's     supervision for UB selfcare, min assist for LB selfcare and for functional transfers  modified independent  selfcare retraining, transfer training, balance retraining, pt/family education   Mobility     supervision bed mobility and w/c propulsion, min assist transfers and gait with RW 25 ft  supervision- Mod I  transfers, gait, LE strength and flexibility, endurance, w/c propulsion  Communication               Safety/Cognition/ Behavioral Observations   Alert and oriented x4, 1 assist with RW, using BSC   remains with normal cognition free from injury or falls  assess cognition qshift remains free from falls and injury   Pain     Pt has chronic pain in abdomen, legs, back receives oxycodone IR 15mg  Q4H ATC, and also oxycodone IR 5mg  Q4H prn with relief  pts stated pain will be <=3/10  pt will have pain that is <=3 assess pain Q4H and prn medicate as needed and monitor for relief notify MD is pain not controlled   Skin     Wound vac treatment to Left BKA, no signs symptoms of infection, no skin breakdown  Continue wound vac as ordered, skin will remain free of infection or breakdown  Assess wound and skin qs      *See Care Plan and progress notes  for long and short-term goals.      Barriers to Discharge   Current Status/Progress Possible Resolutions Date Resolved   Physician     Medical stability;Weight bearing restrictions;Wound Care     See above  Therapies, optimize BP/DM meds, follow labs, VAC d/c when appropriate      Nursing   Wound Care             PT                    OT                 SLP            SW Decreased caregiver support Mom has health issues and can offer physical assist             Discharge Planning/Teaching Needs:    Home with mom who can provide supervision level only due to her health issues.     Team Discussion:  Goals mod/i wheelchair and supervision-ambulation. Vac hopefully discharged Friday, MRSA precautions discharged. MD watching HTN, diabetes and labs. DC saline lock. New eval today. MD managing pain meds-pt having much pain and MD plans to decrease preparing for home  Revisions to Treatment Plan:  DC 1/22- new evaluation    Continued Need for Acute Rehabilitation Level of Care: The patient requires daily medical management by a physician with specialized training in physical medicine and rehabilitation for the following conditions: Daily direction of a multidisciplinary physical rehabilitation program to ensure safe treatment while eliciting the highest outcome that is of practical value to the patient.: Yes Daily medical management of patient stability for increased activity during participation in an intensive rehabilitation regime.: Yes Daily analysis of laboratory values and/or radiology reports with any subsequent need for medication adjustment of medical intervention for : Post surgical problems;Diabetes problems;Wound care problems;Blood pressure problems;Other   Arthur Burke 08/27/2017, 3:36 PM                 Patient ID: Arthur Burke, male   DOB: 1966/03/17, 52 y.o.   MRN: [id number]

## 2017-08-27 NOTE — Evaluation (Signed)
Occupational Therapy Assessment and Plan  Patient Details  Name: Arthur Burke MRN: [id number] Date of Birth: 1966/03/17  OT Diagnosis: acute pain and muscle weakness (generalized) Rehab Potential: Rehab Potential (ACUTE ONLY): Good ELOS: 7 days   Today's Date: 08/27/2017 OT Individual Time: 1100-1200 OT Individual Time Calculation (min): 60 min     Problem List:  Patient Active Problem List   Diagnosis Date Noted  . Unilateral complete BKA, left, sequela ([place])   . Leukocytosis   . AKI (acute kidney injury) ([place])   . Diabetic peripheral neuropathy associated with type 2 diabetes mellitus ([place])   . Amputation of left lower extremity below knee ([place]) 08/26/2017  . Anemia in chronic kidney disease   . Unilateral complete BKA, left, subsequent encounter ([place])   . Post-operative pain   . Stage 3 chronic kidney disease ([place])   . Hyponatremia   . Benign essential HTN   . Acute blood loss anemia   . S/P BKA (below knee amputation) unilateral, left ([place]) 08/23/2017  . Streptococcal bacteremia 08/23/2017  . Acute osteomyelitis of left calcaneus (HCC)   . Lower limb ulcer, ankle, left, with necrosis of muscle ([place])   . Normocytic anemia   . Infected ulcer of skin, with fat layer exposed ([place])   . Renal transplant recipient   . 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 ([place])     Past Medical History:  Past Medical History:  Diagnosis Date  . Diabetes mellitus without complication ([place])   . Diabetic foot infection ([place])   . Hypertension   . MRSA (methicillin resistant staph aureus) culture positive   . Renal disorder   . Vision impairment    Past Surgical History:  Past Surgical History:  Procedure Laterality Date  . AMPUTATION Left 08/23/2017   Procedure: AMPUTATION BELOW KNEE;  Surgeon: Newt Minion, MD;  Location: [facility];  Service: Orthopedics;  Laterality: Left;  . APPLICATION OF  WOUND VAC Left 08/23/2017   Procedure: APPLICATION OF WOUND VAC;  Surgeon: Newt Minion, MD;  Location: [facility];  Service: Orthopedics;  Laterality: Left;  . KIDNEY TRANSPLANT  2006   [facility]  . SKIN GRAFT     L diabetic foot wound with debriedment and skin grafting  . TOE AMPUTATION     R great and 2nd toe    Assessment & Plan Clinical Impression: Patient is a 52 y.o. 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 Per chart review and patient, patient lives with mother. Independent prior to admission using a walker. One level home. His mother uses a walker for balance and still drives and she can assist but limited physically. Presented 08/21/2017 with gangrenous changes of the left foot, fever 100.9, WBC 18,000. MRI shows recurrent osteomyelitis extensive necrosis. Blood cultures 1 of 2 grew strep species with infectious disease consulted and placed on cefepime changed to vancomycin which was later discontinued 08/25/2017 Limb was not felt to be salvageable and underwent transtibial amputation 08/23/2017 per Dr. Sharol Given. Hospital course pain management with home dose of oxycodone resumed 50 mg every 4 hours and was also receiving intravenous Dilaudid. Hyponatremia 129-132. Remains on contact precautions for MRSA. Acute blood loss  anemia 10.4 and monitored.    Patient transferred to CIR on 08/26/2017 .    Patient currently requires min with basic self-care skills and functional mobility  secondary to muscle weakness and acute pain, decreased cardiorespiratoy endurance and decreased sitting balance, decreased standing balance and decreased balance strategies.  Prior to hospitalization, patient could complete ADL with modified independent  .  Patient will benefit from skilled intervention to decrease level of assist with basic self-care skills and increase independence with basic self-care skills prior to discharge home with care partner.  Anticipate patient will require intermittent supervision and follow up home health.  OT - End of Session Activity Tolerance: Tolerates 30+ min activity with multiple rests Endurance Deficit: Yes OT Assessment Rehab Potential (ACUTE ONLY): Good OT Patient demonstrates impairments in the following area(s): Balance;Safety;Endurance;Pain;Skin Integrity OT Basic ADL's Functional Problem(s): Bathing;Dressing;Toileting OT Advanced ADL's Functional Problem(s): Simple Meal Preparation OT Transfers Functional Problem(s): Toilet;Tub/Shower OT Additional Impairment(s): None OT Plan OT Intensity: Minimum of 1-2 x/day, 45 to 90 minutes OT Duration/Estimated Length of Stay: 7 days OT Treatment/Interventions: Balance/vestibular training;Discharge planning;Pain management;Self Care/advanced ADL retraining;Therapeutic Activities;UE/LE Coordination activities;Therapeutic Exercise;Skin care/wound managment;Patient/family education;Functional mobility training;Disease mangement/prevention;Community reintegration;DME/adaptive equipment instruction;Neuromuscular re-education;Psychosocial support;UE/LE Strength taining/ROM;Splinting/orthotics;Wheelchair propulsion/positioning OT Self Feeding Anticipated Outcome(s): n/a OT Basic Self-Care Anticipated Outcome(s): mod I  OT Toileting Anticipated Outcome(s): mod I  OT Bathroom Transfers Anticipated Outcome(s): mod I  OT Recommendation Patient destination: Home Follow Up Recommendations: Home health OT Equipment Recommended: To be determined   Skilled Therapeutic Intervention OT eval initiated with OT goals, purpose and role discussed. Self care retraining at sink level due to current wound vac. Educated and discussed option of showering once wound vac is d/ced.  Focus on sit to stand, standing balance with bilateral to one only one UE support, functional transfers etc. Pt anticipated needing to ambulate into the bathroom due to doorway being too narrow for w/c. Pt self propelled with w/c gloves to tub room with instructional cues for w/c mechanics. Pt demonstrated functional ambulation with RW into bathroom to toilet with min guard (therapist carrying wound vac) min A to sit on toilet and min A to return to standing. Pt required use of sink next to toilet to assist himself up. Discussed and recommended a BSC over the toilet.  Pt does report his shower chair does completely sit in the tub but it is small enough he can reach the seat to sit back on and turn to get in the shower (he was doing this before).  Ambulated from room door to bed and performed mobility mod I. Discussed and educated on the [employer] as a resource.   OT Evaluation Precautions/Restrictions  Precautions Precautions: Fall Precaution Comments: wound Vac in place Restrictions Weight Bearing Restrictions: No LLE Weight Bearing: Non weight bearing General Chart Reviewed: Yes   Pain Pain Assessment Pain Score: 8  Faces Pain Scale: Hurts a little bit Pain Type: Surgical pain Pain Location: Leg Pain Intervention(s): RN made aware;Repositioned 2nd Pain Site Pain Location: Abdomen Pain Frequency: Intermittent Home Living/Prior Functioning Home Living Family/patient expects to be discharged to:: Private residence Living Arrangements: Parent Available Help at Discharge: Family, Available 24 hours/day Type of Home: House Home Access: Level entry Home Layout: One level Bathroom Shower/Tub: Chiropodist: Standard Prior Function Level of Independence: Requires assistive device for independence  Able to Take Stairs?: No Driving: No Vocation: On disability Leisure: Hobbies-yes (Comment) Comments: Pt already has RW. Likes to get on the computer and listen  to music, used to play the drums.  ADL ADL ADL Comments: (P) see66fnctional navigator Vision Baseline Vision/History: Wears glasses;Cataracts Wears Glasses: Reading only Patient Visual Report: No change from baseline Perception  Perception: Within Functional Limits Praxis Praxis: Intact Cognition Overall Cognitive Status: Within Functional Limits for tasks assessed Arousal/Alertness: Awake/alert Orientation Level: Person;Place;Situation Person: Oriented Place: Oriented Situation: Oriented Year: 2019 Month: January Day of Week: Correct Memory: Appears intact Immediate Memory Recall: Sock;Blue;Bed Memory Recall: Sock;Blue;Bed Memory Recall Sock: Without Cue Memory Recall Blue: Without Cue Memory Recall Bed: Without Cue Attention: Sustained;Focused Focused Attention: Appears intact Sustained Attention: Appears intact Awareness: Appears intact Problem Solving: Appears intact Safety/Judgment: Appears intact Sensation Sensation Light Touch: Appears Intact Stereognosis: Not tested Hot/Cold: Not tested Proprioception: Appears Intact Additional Comments: grossly intact B LEs Coordination Gross Motor Movements are Fluid and Coordinated: No Fine Motor Movements are Fluid and Coordinated: Yes Coordination and Movement Description: coordination impaired in B LEs secondary to pain and L BKA Motor  Motor Motor: Within Functional Limits Motor - Skilled Clinical Observations: generalized weakness and acute pain Mobility  Transfers Transfers: Sit to Stand;Stand to Sit Sit to Stand: 4: Min assist Stand to Sit: 4: Min guard  Trunk/Postural Assessment  Cervical Assessment Cervical Assessment: Within Functional Limits Thoracic Assessment Thoracic Assessment: Within Functional Limits Lumbar Assessment Lumbar Assessment: Within Functional Limits Postural Control Postural Control: Within Functional Limits  Balance Balance Balance Assessed: Yes Static Sitting Balance Static  Sitting - Level of Assistance: 5: Stand by assistance Dynamic Sitting Balance Dynamic Sitting - Level of Assistance: 5: Stand by assistance Static Standing Balance Static Standing - Level of Assistance: 4: Min assist Dynamic Standing Balance Dynamic Standing - Level of Assistance: 4: Min assist Extremity/Trunk Assessment RUE Assessment RUE Assessment: Within Functional Limits LUE Assessment LUE Assessment: Within Functional Limits   See Function Navigator for Current Functional Status.   Refer to Care Plan for Long Term Goals  Recommendations for other services: None    Discharge Criteria: Patient will be discharged from OT if patient refuses treatment 3 consecutive times without medical reason, if treatment goals not met, if there is a change in medical status, if patient makes no progress towards goals or if patient is discharged from hospital.  The above assessment, treatment plan, treatment alternatives and goals were discussed and mutually agreed upon: by patient  SNicoletta Ba1/16/2019, 11:44 AM

## 2017-08-27 NOTE — Care Management Note (Signed)
Inpatient Rehabilitation Center Individual Statement of Services  Patient Name:  Arthur Burke  Date:  08/27/2017  Welcome to the [facility].  Our goal is to provide you with an individualized program based on your diagnosis and situation, designed to meet your specific needs.  With this comprehensive rehabilitation program, you will be expected to participate in at least 3 hours of rehabilitation therapies Monday-Friday, with modified therapy programming on the weekends.  Your rehabilitation program will include the following services:  Physical Therapy (PT), Occupational Therapy (OT), 24 hour per day rehabilitation nursing, Therapeutic Recreaction (TR), Neuropsychology, Case Management (Social Worker), Rehabilitation Medicine, Nutrition Services and Pharmacy Services  Weekly team conferences will be held on Wednesday to discuss your progress.  Your Social Worker will talk with you frequently to get your input and to update you on team discussions.  Team conferences with you and your family in attendance may also be held.  Expected length of stay: 7 days Overall anticipated outcome: mod/i-supervision level  Depending on your progress and recovery, your program may change. Your Social Worker will coordinate services and will keep you informed of any changes. Your Social Worker's name and contact numbers are listed  below.  The following services may also be recommended but are not provided by the [facility]:    [facility] will be made to provide these services after discharge if needed.  Arrangements include referral to agencies that provide these services.  Your insurance has been verified to be:  Medicare Your primary doctor is: [facility]   Pertinent information will be shared with your doctor and your insurance company.  Social Worker:  Ovidio Kin, [place] or ([phone]  Information discussed with and copy given to patient by: [name], 08/27/2017, 9:44 AM

## 2017-08-27 NOTE — Progress Notes (Signed)
Social Work Patient ID: [name], male   DOB: 09-Dec-1965, 52 y.o.   MRN: [id number]  Met with pt to discuss team conference goals mod/i wheelchair level and supervision with ambulation. [employer] discharge date 1/22. He feels he will be ready by then. This gives him a week to get his therapies and prepare for home. Will work on discharge needs, brother or Mom to measure the doorways of home to see if wheelchair accessible.

## 2017-08-27 NOTE — Progress Notes (Signed)
Social Work  Social Work Assessment and Plan  Patient Details  Name: Arthur Burke MRN: [id number] Date of Birth: 05/21/1966  Today's Date: 08/27/2017  Problem List:  Patient Active Problem List   Diagnosis Date Noted  . Unilateral complete BKA, left, sequela ([place])   . Leukocytosis   . AKI (acute kidney injury) ([place])   . Diabetic peripheral neuropathy associated with type 2 diabetes mellitus ([place])   . Amputation of left lower extremity below knee ([place]) 08/26/2017  . Anemia in chronic kidney disease   . Unilateral complete BKA, left, subsequent encounter ([place])   . Post-operative pain   . Stage 3 chronic kidney disease ([place])   . Hyponatremia   . Benign essential HTN   . Acute blood loss anemia   . S/P BKA (below knee amputation) unilateral, left ([place]) 08/23/2017  . Streptococcal bacteremia 08/23/2017  . Acute osteomyelitis of left calcaneus (HCC)   . Lower limb ulcer, ankle, left, with necrosis of muscle ([place])   . Normocytic anemia   . Infected ulcer of skin, with fat layer exposed ([place])   . Renal transplant recipient   . 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 ([place])    Past Medical History:  Past Medical History:  Diagnosis Date  . Diabetes mellitus without complication ([place])   . Diabetic foot infection ([place])   . Hypertension   . MRSA (methicillin resistant staph aureus) culture positive   . Renal disorder   . Vision impairment    Past Surgical History:  Past Surgical History:  Procedure Laterality Date  . AMPUTATION Left 08/23/2017   Procedure: AMPUTATION BELOW KNEE;  Surgeon: Newt Minion, MD;  Location: [facility];  Service: Orthopedics;  Laterality: Left;  . APPLICATION OF WOUND VAC Left 08/23/2017   Procedure: APPLICATION OF WOUND VAC;  Surgeon: Newt Minion, MD;  Location: [facility];  Service: Orthopedics;  Laterality: Left;  . KIDNEY TRANSPLANT  2006   [facility]  . SKIN GRAFT      L diabetic foot wound with debriedment and skin grafting  . TOE AMPUTATION     R great and 2nd toe   Social History:  reports that  has never smoked. he has never used smokeless tobacco. He reports that he does not drink alcohol or use drugs.  Family / Support Systems Marital Status: Divorced Patient Roles: Other (Comment)(Mom and brother) Other Supports: Ada Thompson-Mom [phone]-cell  Younger brother Anticipated Caregiver: Self and Mom with limited assist Ability/Limitations of Caregiver: Mom uses a rollator and can not provide physical assist Caregiver Availability: 24/7(supervision only) Family Dynamics: Pt is close with his Mom and brother, they are supportive and involved in each other's lives. Mom does the driving and pt does the shopping. They work together as a Therapist, occupational. His brother helps when he can. Pt has a few friends also who are supportive.  Social History Preferred language: English Religion: Baptist Cultural Background: No issues Education: High School Read: Yes Write: Yes Employment Status: Disabled Date Retired/Disabled/Unemployed: 2012 Freight forwarder Issues: No issues Guardian/Conservator: none-according to MD pt is capable of making his own decisions while here.   Abuse/Neglect Abuse/Neglect Assessment Can Be Completed: Yes Physical Abuse: Denies Verbal Abuse: Denies Sexual Abuse: Denies Exploitation of patient/patient's resources: Denies Self-Neglect: Denies  Emotional Status Pt's affect, behavior adn adjustment status: Pt is motivated to do well and recover from his amputation. He is having pain issues  but is trying to work through them. He wants to be sure he is mod/i so as not to burden his Mom she has her own health issues. Will make sure team is on board with this. Recent Psychosocial Issues: other health issues-goes to several [facility] clinics for follow up Pyschiatric History: No history deferred depression screen due to coping appropriately  and able to verbalize his feelings. Do feel he wouold benefit from seeing neuro-psych while here will ask team's input. Substance Abuse History: No issues  Patient / Family Perceptions, Expectations & Goals Pt/Family understanding of illness & functional limitations: Pt and Mom can explain his amputation and the MD did try to safe his leg but it just wasn't possible. Pt is ready to move forward and recover and become independent again. His main goal is to heal now so he is a prothestic candidate. Premorbid pt/family roles/activities: Son, brother, friend, retiree, neighbor, Social research officer, government Anticipated changes in roles/activities/participation: resume Pt/family expectations/goals: Pt states: " I hope to be able to take care of myself before I leave here."  Mom states: " I know he will do well here, he is a Nurse, adult."  [employer]: Other (Comment)([facility], Wound Clinic) Premorbid Home Care/DME Agencies: Other (Comment)(has rw, tub seat and bsc) Transportation available at discharge: Mom does the driving Resource referrals recommended: Neuropsychology, Support group (specify)  Discharge Planning Living Arrangements: Parent Support Systems: Parent, Other relatives, Friends/neighbors Type of Residence: Private residence Insurance Resources: [employer] Financial Resources: SSD, Family Support Financial Screen Referred: Previously completed Living Expenses: Lives with family Money Management: Patient, Family Does the patient have any problems obtaining your medications?: No Home Management: Mom and pt do their best Patient/Family Preliminary Plans: Return home with Mom who can be here with him and offer supervision if needed. Pt plans on being mod/i and will have brother measure the doorways for wheelchair access. Await team's goals and work on safe discharge plan. Sw Barriers to Discharge: Decreased caregiver support Sw Barriers to Discharge Comments: Mom has health issues and  can offer physical assist Social Work Anticipated Follow Up Needs: HH/OP  Clinical Impression Pleasant gentleman who has been through a lot in his life. He is optimistic and motivated to recover and be independent again. His mom and brother are involved and supportive. Do fel pt would benefit from seeing neuro-psych while here will ask team's input. Work on discharge needs.  [name] 08/27/2017, 1:38 PM

## 2017-08-28 ENCOUNTER — Inpatient Hospital Stay (HOSPITAL_COMMUNITY): Payer: Medicare Other | Admitting: Occupational Therapy

## 2017-08-28 ENCOUNTER — Inpatient Hospital Stay (HOSPITAL_COMMUNITY): Payer: Medicare Other | Admitting: Physical Therapy

## 2017-08-28 ENCOUNTER — Inpatient Hospital Stay (HOSPITAL_COMMUNITY): Payer: Medicare Other

## 2017-08-28 DIAGNOSIS — I10 Essential (primary) hypertension: Secondary | ICD-10-CM

## 2017-08-28 LAB — CULTURE, BLOOD (ROUTINE X 2)
CULTURE: NO GROWTH
Culture: NO GROWTH
SPECIAL REQUESTS: ADEQUATE

## 2017-08-28 LAB — GLUCOSE, CAPILLARY
GLUCOSE-CAPILLARY: 105 mg/dL — AB (ref 65–99)
GLUCOSE-CAPILLARY: 181 mg/dL — AB (ref 65–99)
GLUCOSE-CAPILLARY: 168 mg/dL — AB (ref 65–99)
Glucose-Capillary: 87 mg/dL (ref 65–99)

## 2017-08-28 NOTE — Progress Notes (Signed)
Physical Therapy Session Note  Patient Details  Name: Arthur Burke MRN: [id number] Date of Birth: [birth year]  Today's Date: 08/28/2017 PT Individual Time: 1105-1200 AND 1530-1600 PT Individual Time Calculation (min): 55 min AND 30 min   Short Term Goals: Week 1:  PT Short Term Goal 1 (Week 1): stg=ltg dues to ELOS  Skilled Therapeutic Interventions/Progress Updates:   Session 1:  Pt supine and agreeable to therapy, c/o pain as detailed below but tolerable. Pt transferred to EOB and to w/c via lateral scoot w/ supervision. Pt self-propelled w/c around unit in 150-200' bouts w/ supervision using BUEs to work on functional endurance. Additionally performed w/c mobility in simulated home environment w/ cones requiring pt to make tight turns and increase awareness of environment. Negotiated ramp w/ supervision and verbal cues for technique. Worked on pre-gait and RLE strengthening tasks in parallel bars, emphasis on slow and controlled steps, performed step to/from x5 and step to/from 2" step x5 w/ close supervision. Ambulated 56' x2 w/ close supervision using RW and w/c follow for safety, verbal cues for step clearance. Returned to room and ended session in supine, call bell within reach and all needs met.   Session 2:  Pt supine and agreeable to therapy, no c/o pain during session. Transferred to EOB and to w/c w/ supervision via squat pivot. Pt self propelled w/c to/from therapy gym w/ supervision using BUEs, emphasized managing w/c parts w/o assistance this session. Performed dynamic standing to increase tolerance to standing and balance as well. Stood at high/low table in 2-3 min bouts w/ close supervision while performing UE task w/ alternating extremities. Brief seated rest breaks in between bouts 2/2 fatigue. Returned to room and transferred back to EOB and to supine w/ supervision. Ended session in supine, call bell within reach and all needs met.   Therapy Documentation Precautions:   Precautions Precautions: Fall Precaution Comments: wound Vac in place Restrictions Weight Bearing Restrictions: No LLE Weight Bearing: Non weight bearing Pain: Pain Assessment Pain Assessment: 0-10 Pain Score: 6  Pain Type: Surgical pain;Phantom pain Pain Location: Leg Pain Orientation: Left Pain Descriptors / Indicators: Sore;Aching;Cramping Pain Frequency: Intermittent Pain Onset: On-going Patients Stated Pain Goal: 2 Pain Intervention(s): Medication (See eMAR) Multiple Pain Sites: No  See Function Navigator for Current Functional Status.   Therapy/Group: Individual Therapy  Leonte Horrigan K Arnette 08/28/2017, 12:09 PM

## 2017-08-28 NOTE — Progress Notes (Signed)
[place] PHYSICAL MEDICINE & REHABILITATION     PROGRESS NOTE  Subjective/Complaints:  Patient seen lying in bed this morning. He states he slept better overnight. He notes he had a good first in therapies because he wants to go home soon as possible.  ROS: denies CP, SOB, N/V/D.  Objective: Vital Signs: Blood pressure (!) 170/64, pulse (!) 56, temperature 97.8 F (36.6 C), temperature source Oral, resp. rate 16, height 5\' 11"  (1.803 m), weight 79.4 kg (175 lb 0.7 oz), SpO2 98 %. No results found. Recent Labs    08/26/17 0646 08/27/17 0558  WBC 14.1* 14.9*  HGB 10.4* 11.1*  HCT 31.6* 33.0*  PLT 315 361   Recent Labs    08/26/17 0646 08/27/17 0558  NA 129* 133*  K 4.8 4.9  CL 95* 100*  GLUCOSE 311* 214*  BUN 21* 24*  CREATININE 1.17 1.26*  CALCIUM 8.9 9.2   CBG (last 3)  Recent Labs    08/27/17 1706 08/27/17 2041 08/28/17 0614  GLUCAP 72 190* 105*    Wt Readings from Last 3 Encounters:  08/27/17 79.4 kg (175 lb 0.7 oz)  08/21/17 80.6 kg (177 lb 11.1 oz)    Physical Exam:  BP (!) 170/64 (BP Location: Right Arm)   Pulse (!) 56   Temp 97.8 F (36.6 C) (Oral)   Resp 16   Ht 5\' 11"  (1.803 m)   Wt 79.4 kg (175 lb 0.7 oz)   SpO2 98%   BMI 24.41 kg/m  Constitutional: He appears well-developed. NAD. HENT: Normocephalic. Atraumatic.  Eyes: EOM are normal. No discharge.  Cardiovascular: RRR. No JVD Respiratory: Effort normaland breath sounds normal. GI: Bowel sounds are normal. He exhibits no distension.  Neurological: He is alert and oriented.  Motor: B/l UE 5/5 proximal to distal RLE: HF:4+ to 5/5 prox to distal LLE: HF 4/5 HF, KE 4-/5 (pain inhibition).  Skin. 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  Psych: pleasant and appropriate.  Assessment/Plan: 1. Functional deficits secondary to left BKA which require 3+ hours per day of interdisciplinary therapy in a comprehensive inpatient rehab setting. Physiatrist is providing close team  supervision and 24 hour management of active medical problems listed below. Physiatrist and rehab team continue to assess barriers to discharge/monitor patient progress toward functional and medical goals.  Function:  Bathing Bathing position   Position: Wheelchair/chair at sink  Bathing parts Body parts bathed by patient: Right arm, Left arm, Chest, Abdomen, Front perineal area, Buttocks, Right upper leg, Left upper leg, Right lower leg Body parts bathed by helper: Back  Bathing assist Assist Level: Touching or steadying assistance(Pt > 75%)      Upper Body Dressing/Undressing Upper body dressing   What is the patient wearing?: Pull over shirt/dress     Pull over shirt/dress - Perfomed by patient: Thread/unthread right sleeve, Thread/unthread left sleeve, Put head through opening, Pull shirt over trunk          Upper body assist Assist Level: Supervision or verbal cues      Lower Body Dressing/Undressing Lower body dressing   What is the patient wearing?: Underwear, Pants, Non-skid slipper socks Underwear - Performed by patient: Thread/unthread right underwear leg, Thread/unthread left underwear leg Underwear - Performed by helper: Pull underwear up/down     Non-skid slipper socks- Performed by patient: Don/doff right sock                    Lower body assist Assist for lower  body dressing: Supervision or verbal cues      Toileting Toileting   Toileting steps completed by patient: Adjust clothing prior to toileting, Adjust clothing after toileting, Performs perineal hygiene      Toileting assist Assist level: More than reasonable time   Transfers Chair/bed transfer   Chair/bed transfer method: Squat pivot, Stand pivot Chair/bed transfer assist level: Touching or steadying assistance (Pt > 75%) Chair/bed transfer assistive device: Armrests     Locomotion Ambulation     Max distance: 25 ft Assist level: Touching or steadying assistance (Pt > 75%)    Wheelchair   Type: Manual Max wheelchair distance: 150 ft Assist Level: Supervision or verbal cues  Cognition Comprehension Comprehension assist level: Follows basic conversation/direction with no assist  Expression Expression assist level: Expresses basic needs/ideas: With no assist  Social Interaction Social Interaction assist level: Interacts appropriately with others - No medications needed.  Problem Solving Problem solving assist level: Solves basic problems with no assist  Memory Memory assist level: Complete Independence: No helper    Medical Problem List and Plan:  1. 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 DVT Prophylaxis/Anticoagulation: SCDs right lower extremity  3. Pain Management/chronic pain: Oxycodone immediate release 15 mg every 4 hours as prior to admission, low dose oxycodone immediate release 5 mg every 4 hours as needed and Robaxin as needed. Scheduled gabapentin for phantom/neuropathic pain.  4. Mood: Provide emotional support  5. Neuropsych: This patient is capable of making decisions on his own behalf.  6. Skin/Wound Care: Routine skin checks.   Plan to DC wound VAC on 1/19 7. Fluids/Electrolytes/Nutrition: Routine I&O's  8. Hypertension. Coreg 25 mg twice a day, clonidine 0.1 mg twice a day, Norvasc 10 mg daily, Altace 20 mg daily    Elevated this morning, otherwise relatively controlled   Continue to monitor 9. Diabetes mellitus peripheral neuropathy. Hemoglobin A1c 8.6. Lantus insulin 32 units daily, NovoLog 10 units 3 times a day with meals. Check blood sugars before meals and at bedtime. Diabetic teaching    Relatively controlled on 1/17 10. Renal and pancreas transplant recipient. Continue home immunosuppressive meds    With AKI, Cr 1.26 on 1/16   Labs ordered for tomorrow   Cont to monitor 11. Acute blood loss anemia.    Hb 11.1 on 1/16   Labs ordered for tomorrow   Cont to monitor 12. Constipation.  Laxative assistance  13. Hyperlipidemia. Lipitor  14. Hyponatremia   Na 133 on 1/16   Labs ordered for tomorrow   Cont to monitor 15. Leukocytosis   WBCs 14.9 on 1/16   Labs ordered for tomorrow   Afebrile   Cont to monitor  LOS (Days) 2 A FACE TO FACE EVALUATION WAS PERFORMED  Arthur Burke Phenix 08/28/2017 8:21 AM

## 2017-08-28 NOTE — Progress Notes (Signed)
Occupational Therapy Session Note  Patient Details  Name: Arthur Burke MRN: [id number] Date of Birth: 1966-05-09  Today's Date: 08/28/2017 OT Individual Time: 5427-0623 OT Individual Time Calculation (min): 45 min    Short Term Goals: Week 1:  OT Short Term Goal 1 (Week 1): STG=LTG  Skilled Therapeutic Interventions/Progress Updates:    Pt completed transfer from supine to sit EOB with supervision and then transferred to the wheelchair squat pivot at the same level.  He was able to work on bathing, grooming, and dressing tasks at the sink.  Min guard assist for sit to stand from the wheelchair to wash his peri area and pull pants over hips.  He completed oral hygiene with modified independence as well from the wheelchair.  Returned to bed at end of session with call button and phone in reach.    Therapy Documentation Precautions:  Precautions Precautions: Fall Precaution Comments: wound Vac in place Restrictions Weight Bearing Restrictions: No LLE Weight Bearing: Non weight bearing  Pain: Pain Assessment Pain Assessment: Faces Pain Score: 6  Faces Pain Scale: Hurts a little bit Pain Type: Surgical pain Pain Location: Leg Pain Orientation: Left Pain Descriptors / Indicators: Burning Pain Frequency: Intermittent Pain Onset: On-going Patients Stated Pain Goal: 2 Pain Intervention(s): Emotional support;Repositioned Multiple Pain Sites: No 2nd Pain Site Pain Location: Leg Pain Orientation: Left Pain Descriptors / Indicators: Discomfort ADL: See Function Navigator for Current Functional Status.   Therapy/Group: Individual Therapy  Sheyla Zaffino OTR/L 08/28/2017, 3:49 PM

## 2017-08-28 NOTE — Progress Notes (Signed)
Occupational Therapy Session Note  Patient Details  Name: Arthur Burke MRN: [id number] Date of Birth: 11-19-1965  Today's Date: 08/28/2017 OT Individual Time: 1000-1055 OT Individual Time Calculation (min): 55 min    Short Term Goals: Week 1:  OT Short Term Goal 1 (Week 1): STG=LTG  Skilled Therapeutic Interventions/Progress Updates:    Pt resting in bed upon arrival with RN present.  OT intervention with focus on bed mobility, functional amb with RW, and standing balance/tolerance.  Pt declined changing clothing or bathing 2/2 no clean clothes to change into.  Discussed home setup and bathroom layout.  Pt bathroom door 24" and RW 23".  Pt currently own tub seat that is situated lengthwise in tub.  Pt currently owns standard walker.  Pt does not own BSC but may benefit from [facility] to use during night (pt states he uses the bathroom Q2hrs at night). Pt performed sit<>standX 7 and stood for 2-3 mins each time standing.  Pt hopped with RW to bathroom door and back.  Pt remained in bed with all needs within reach.   Therapy Documentation Precautions:  Precautions Precautions: Fall Precaution Comments: wound Vac in place Restrictions Weight Bearing Restrictions: No LLE Weight Bearing: Non weight bearing Pain: Pain Assessment Pain Assessment: 0-10 Pain Score: 6  Pain Type: Surgical pain;Phantom pain Pain Location: Leg Pain Orientation: Left Pain Descriptors / Indicators: Sore;Aching;Cramping Pain Frequency: Intermittent Pain Onset: On-going Patients Stated Pain Goal: 2 Pain Intervention(s): Medication (See eMAR) Multiple Pain Sites: No  See Function Navigator for Current Functional Status.   Therapy/Group: Individual Therapy  [name] 08/28/2017, 10:59 AM

## 2017-08-29 ENCOUNTER — Inpatient Hospital Stay (HOSPITAL_COMMUNITY): Payer: Medicare Other | Admitting: Occupational Therapy

## 2017-08-29 ENCOUNTER — Inpatient Hospital Stay (HOSPITAL_COMMUNITY): Payer: Medicare Other

## 2017-08-29 ENCOUNTER — Inpatient Hospital Stay (HOSPITAL_COMMUNITY): Payer: Medicare Other | Admitting: Physical Therapy

## 2017-08-29 LAB — BASIC METABOLIC PANEL
ANION GAP: 11 (ref 5–15)
BUN: 29 mg/dL — ABNORMAL HIGH (ref 6–20)
CO2: 27 mmol/L (ref 22–32)
CREATININE: 1.22 mg/dL (ref 0.61–1.24)
Calcium: 9.7 mg/dL (ref 8.9–10.3)
Chloride: 98 mmol/L — ABNORMAL LOW (ref 101–111)
GFR calc non Af Amer: >60 mL/min (ref >60)
Glucose, Bld: 133 mg/dL — ABNORMAL HIGH (ref 65–99)
Potassium: 4.9 mmol/L (ref 3.5–5.1)
SODIUM: 136 mmol/L (ref 135–145)

## 2017-08-29 LAB — CBC WITH DIFFERENTIAL/PLATELET
Basophils Absolute: 0 10*3/uL (ref 0.0–0.1)
Basophils Relative: 0 %
EOS ABS: 0.3 10*3/uL (ref 0.0–0.7)
EOS PCT: 2 %
HCT: 32.1 % — ABNORMAL LOW (ref 39.0–52.0)
HEMOGLOBIN: 10.4 g/dL — AB (ref 13.0–17.0)
LYMPHS PCT: 39 %
Lymphs Abs: 6.5 10*3/uL — ABNORMAL HIGH (ref 0.7–4.0)
MCH: 28 pg (ref 26.0–34.0)
MCHC: 32.4 g/dL (ref 30.0–36.0)
MCV: 86.5 fL (ref 78.0–100.0)
MONO ABS: 1.3 10*3/uL — AB (ref 0.1–1.0)
Monocytes Relative: 8 %
NEUTROS PCT: 51 %
Neutro Abs: 8.6 10*3/uL — ABNORMAL HIGH (ref 1.7–7.7)
PLATELETS: 411 10*3/uL — AB (ref 150–400)
RBC: 3.71 MIL/uL — ABNORMAL LOW (ref 4.22–5.81)
RDW: 13.2 % (ref 11.5–15.5)
WBC: 16.7 10*3/uL — AB (ref 4.0–10.5)

## 2017-08-29 LAB — GLUCOSE, CAPILLARY
GLUCOSE-CAPILLARY: 167 mg/dL — AB (ref 65–99)
Glucose-Capillary: 131 mg/dL — ABNORMAL HIGH (ref 65–99)
Glucose-Capillary: 69 mg/dL (ref 65–99)
Glucose-Capillary: 131 mg/dL — ABNORMAL HIGH (ref 65–99)

## 2017-08-29 NOTE — Progress Notes (Signed)
Physical Therapy Session Note  Patient Details  Name: Arthur Burke MRN: [id number] Date of Birth: June 30, 1966  Today's Date: 08/29/2017 PT Individual Time: 1405-1450 PT Individual Time Calculation (min): 45 min   Short Term Goals: Week 1:  PT Short Term Goal 1 (Week 1): stg=ltg dues to [facility]  Skilled Therapeutic Interventions/Progress Updates:    Session focused on functional transfers with RW with supervision and assist for management of wound vac, short distance gait to simulate household mobility including obstacle navigation and turns (intermittent steadying assist otherwise supervision level and good control/speed noted), w/c mobility for general strengthening and endurance and education on parts management, and introduced supine and sidelying LE therex for increasing ROM and strengthening to residual limb (instructed in supine hip flexion, SAQ, and sidelying hip abduction and extension x 10 -12 reps each). Handout given and reviewed with patient as well. Positioned in prone for full body stretch, especially hip flexors and promoting increased L knee extension. Pt reports significant relief and comfort in this position. Educated on importance of overall stretching and hip/knee extension for future prosthetic use. Pt grateful for all information and continues to make good progress towards long term goals.   Therapy Documentation Precautions:  Precautions Precautions: Fall Precaution Comments: wound Vac in place Restrictions Weight Bearing Restrictions: No LLE Weight Bearing: Non weight bearing  Pain: No complaints of pain currently.  See Function Navigator for Current Functional Status.   Therapy/Group: Individual Therapy  Canary Brim Ivory Broad, PT, DPT  08/29/2017, 3:01 PM

## 2017-08-29 NOTE — Progress Notes (Signed)
[place] PHYSICAL MEDICINE & REHABILITATION     PROGRESS NOTE  Subjective/Complaints:  Patient seen sitting up in bed this morning. He states he slept better overnight. He continues to note improvement in his pain and cramping.  ROS: Denies CP, SOB, N/V/D.  Objective: Vital Signs: Blood pressure (!) 157/63, pulse (!) 57, temperature 98.1 F (36.7 C), temperature source Oral, resp. rate 16, height 5\' 11"  (1.803 m), weight 79.4 kg (175 lb 0.7 oz), SpO2 100 %. No results found. Recent Labs    08/27/17 0558  WBC 14.9*  HGB 11.1*  HCT 33.0*  PLT 361   Recent Labs    08/27/17 0558  NA 133*  K 4.9  CL 100*  GLUCOSE 214*  BUN 24*  CREATININE 1.26*  CALCIUM 9.2   CBG (last 3)  Recent Labs    08/28/17 1714 08/28/17 2106 08/29/17 0626  GLUCAP 181* 168* 131*    Wt Readings from Last 3 Encounters:  08/27/17 79.4 kg (175 lb 0.7 oz)  08/21/17 80.6 kg (177 lb 11.1 oz)    Physical Exam:  BP (!) 157/63 (BP Location: Right Arm)   Pulse (!) 57   Temp 98.1 F (36.7 C) (Oral)   Resp 16   Ht 5\' 11"  (1.803 m)   Wt 79.4 kg (175 lb 0.7 oz)   SpO2 100%   BMI 24.41 kg/m  Constitutional: He appears well-developed. NAD. HENT: Normocephalic. Atraumatic.  Eyes: EOM are normal. No discharge.  Cardiovascular: RRR. No JVD Respiratory: Effort normaland breath sounds normal. GI: Bowel sounds are normal. He exhibits no distension.  Neurological: He is alert and oriented.  Motor: B/l UE 5/5 proximal to distal RLE: HF:4+ to 5/5 prox to distal LLE: HF 4+/5 HF, KE 4+/5 (pain inhibition).  Skin. 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  Psych: pleasant and appropriate.  Assessment/Plan: 1. Functional deficits secondary to left BKA which require 3+ hours per day of interdisciplinary therapy in a comprehensive inpatient rehab setting. Physiatrist is providing close team supervision and 24 hour management of active medical problems listed below. Physiatrist and rehab team  continue to assess barriers to discharge/monitor patient progress toward functional and medical goals.  Function:  Bathing Bathing position   Position: Wheelchair/chair at sink  Bathing parts Body parts bathed by patient: Right arm, Left arm, Chest, Abdomen, Right upper leg, Left upper leg, Front perineal area, Buttocks Body parts bathed by helper: Back  Bathing assist Assist Level: Touching or steadying assistance(Pt > 75%)      Upper Body Dressing/Undressing Upper body dressing   What is the patient wearing?: Pull over shirt/dress     Pull over shirt/dress - Perfomed by patient: Thread/unthread right sleeve, Thread/unthread left sleeve, Put head through opening, Pull shirt over trunk          Upper body assist Assist Level: Supervision or verbal cues      Lower Body Dressing/Undressing Lower body dressing   What is the patient wearing?: Pants Underwear - Performed by patient: Thread/unthread right underwear leg, Thread/unthread left underwear leg Underwear - Performed by helper: Pull underwear up/down Pants- Performed by patient: Thread/unthread right pants leg, Thread/unthread left pants leg, Pull pants up/down   Non-skid slipper socks- Performed by patient: Don/doff right sock                    Lower body assist Assist for lower body dressing: Touching or steadying assistance (Pt > 75%)      Toileting Toileting  Toileting steps completed by patient: Adjust clothing prior to toileting, Adjust clothing after toileting, Performs perineal hygiene Toileting steps completed by helper: Adjust clothing prior to toileting, Performs perineal hygiene, Adjust clothing after toileting    Toileting assist Assist level: More than reasonable time   Transfers Chair/bed transfer   Chair/bed transfer method: Squat pivot Chair/bed transfer assist level: Touching or steadying assistance (Pt > 75%) Chair/bed transfer assistive device: Armrests     Locomotion Ambulation      Max distance: 49' Assist level: Supervision or verbal cues   Wheelchair   Type: Manual Max wheelchair distance: 200' Assist Level: Supervision or verbal cues  Cognition Comprehension Comprehension assist level: Follows basic conversation/direction with no assist  Expression Expression assist level: Expresses basic needs/ideas: With no assist  Social Interaction Social Interaction assist level: Interacts appropriately 90% of the time - Needs monitoring or encouragement for participation or interaction.  Problem Solving Problem solving assist level: Solves basic problems with no assist  Memory Memory assist level: Recognizes or recalls 90% of the time/requires cueing < 10% of the time    Medical Problem List and Plan:  1. Decreased functional mobility secondary to left BKA 08/23/2017    Continue CIR 2. DVT Prophylaxis/Anticoagulation: SCDs right lower extremity  3. Pain Management/chronic pain: Oxycodone immediate release 15 mg every 4 hours as prior to admission, low dose oxycodone immediate release 5 mg every 4 hours as needed and Robaxin as needed. Scheduled gabapentin for phantom/neuropathic pain.  4. Mood: Provide emotional support  5. Neuropsych: This patient is capable of making decisions on his own behalf.  6. Skin/Wound Care: Routine skin checks.   Plan to DC wound VAC on 1/19, no drainage at present 7. Fluids/Electrolytes/Nutrition: Routine I&O's  8. Hypertension. Coreg 25 mg twice a day, clonidine 0.1 mg twice a day, Norvasc 10 mg daily, Altace 20 mg daily    Labile but? Trending up   Continue to monitor for trend 9. Diabetes mellitus peripheral neuropathy. Hemoglobin A1c 8.6. Lantus insulin 32 units daily, NovoLog 10 units 3 times a day with meals. Check blood sugars before meals and at bedtime. Diabetic teaching    Appears to be stabilizing on 1/18 10. Renal and pancreas transplant recipient. Continue home immunosuppressive meds    With AKI, Cr 1.26 on 1/16   Labs pending  for today   Cont to monitor 11. Acute blood loss anemia.    Hb 11.1 on 1/16   Labs pending for today   Cont to monitor 12. Constipation. Laxative assistance  13. Hyperlipidemia. Lipitor  14. Hyponatremia   Na 133 on 1/16   Labs pending for today   Cont to monitor 15. Leukocytosis   WBCs 14.9 on 1/16   Labs pending for today   Afebrile   Cont to monitor  LOS (Days) 3 A FACE TO FACE EVALUATION WAS PERFORMED  Ankit Lorie Phenix 08/29/2017 8:06 AM

## 2017-08-29 NOTE — Progress Notes (Signed)
Occupational Therapy Session Note  Patient Details  Name: [name] MRN: [id number] Date of Birth: 11-04-1965  Today's Date: 08/29/2017 OT Individual Time: 1300-1401 OT Individual Time Calculation (min): 61 min    Short Term Goals: Week 1:  OT Short Term Goal 1 (Week 1): STG=LTG  Skilled Therapeutic Interventions/Progress Updates:    Pt completed transfer from bed to wheelchair with supervision squat pivot to start session.  He then rolled himself down to the therapy gym with supervision.  Worked on standing balance at the high/low table during session while engaged in UE use.  Pt able to alternate UEs to put together PVC pipe puzzle while stabilizing with the off hand.  When using BUEs simultaneously he was able to stabilize his mid section or forearms up against the table for balance.  He was able to stand for 2 intervals of 10 mins each with supervision, before needing rest break.  Returned to room via wheelchair at end of session with call button and phone in reach and pt waiting on PT for next session.    Therapy Documentation Precautions:  Precautions Precautions: Fall Precaution Comments: wound Vac in place Restrictions Weight Bearing Restrictions: Yes LLE Weight Bearing: Non weight bearing  Pain: Pain Assessment Pain Assessment: No/denies pain Pain Score: 6  Pain Type: Phantom pain;Surgical pain Pain Intervention(s): Medication (See eMAR) ADL: See Function Navigator for Current Functional Status.   Therapy/Group: Individual Therapy  Javar Eshbach OTR/L 08/29/2017, 3:42 PM

## 2017-08-29 NOTE — Progress Notes (Addendum)
Occupational Therapy Session Note  Patient Details  Name: Arthur Burke MRN: [id number] Date of Birth: 1966/07/12  Today's Date: 08/29/2017 OT Individual Time: 6644-0347 OT Individual Time Calculation (min): 35 min   Short Term Goals: Week 1:  OT Short Term Goal 1 (Week 1): STG=LTG  Skilled Therapeutic Interventions/Progress Updates:    Pt greeted via PT handoff in gym. Squat pivot<w/c completed with supervision/cues for setup of DME. He self propelled to therapy kitchen to work on kitchen mobility. IADL retraining with pt retrieving items from fridge via w/c and when standing with RW. Issued pt walker bag. He transported items from fridge and cabinets with supervision for DME mgt/safe placement, side stepping in front of countertop as needed with device. Discussed environmental modifications to implement in increase ease of simple meal prep whether he's using w/c or walker at home. Min cues required for w/c parts mgt pre and post ambulation today. He then self propelled to room to strengthen UB, and transferred to bed via squat pivot in manner as written above. Pt repositioned for comfort and left with all needs within reach.    Therapy Documentation Precautions:  Precautions Precautions: Fall Precaution Comments: wound Vac in place Restrictions Weight Bearing Restrictions: Yes LLE Weight Bearing: Non weight bearing Pain: Pain Assessment Pain Assessment: No/denies pain Pain Score: 6  Pain Type: Phantom pain;Surgical pain Pain Intervention(s): Medication (See eMAR) ADL: ADL ADL Comments: (P) see21functional navigator    See Function Navigator for Current Functional Status.   Therapy/Group: Individual Therapy  Bailei Buist A Eliza Grissinger 08/29/2017, 4:29 PM

## 2017-08-29 NOTE — Progress Notes (Signed)
Occupational Therapy Session Note  Patient Details  Name: CRAIGE PATEL MRN: [id number] Date of Birth: [birth year]  Today's Date: 08/29/2017 OT Individual Time: 2671-2458 OT Individual Time Calculation (min): 56 min    Short Term Goals: Week 1:  OT Short Term Goal 1 (Week 1): STG=LTG  Skilled Therapeutic Interventions/Progress Updates:    1:1. Pt decline bathing and dressing d/t not having clean clothes. Pt requesting to work on standing balance and discuss tub options once cleared to shower. Pt and OT discuss set up of home shower and pt squat pivot transfer EOB<>w/c with supervision to L with VC for hand placement. Pt propels w/c to/from all tx destinations for improved BUE strength required for functional mobility and ADLs. In ADL apartment, OT demo tub transfer, use of shower curtain with TTB and non slip strips at bottom of tub. Pt able to return demo transfer w/c<>TTB with supervision. Pt likes TTB and thinks it will work in bathroom set up. Pt stands to play game of horse shoes with min A for balance and 1 LOB L with min A to recover. Pt stands at sink to groom with touching A. Exited session with pt seated in w./c call light in reach and all needs met  Therapy Documentation Precautions:  Precautions Precautions: Fall Precaution Comments: wound Vac in place Restrictions Weight Bearing Restrictions: No LLE Weight Bearing: Non weight bearing General:   Vital Signs:  Pain: Pain Assessment Pain Assessment: 0-10 Pain Score: 8  Pain Type: Phantom pain;Surgical pain Pain Location: Leg Pain Orientation: Left Pain Descriptors / Indicators: Aching;Burning Pain Frequency: Constant Pain Onset: On-going Pain Intervention(s): Medication (See eMAR) Multiple Pain Sites: No ADL: ADL ADL Comments: (P) [id number] navigator  See Function Navigator for Current Functional Status.   Therapy/Group: Individual Therapy  STonny Branch1/18/2019, 10:26 AM

## 2017-08-30 ENCOUNTER — Inpatient Hospital Stay (HOSPITAL_COMMUNITY): Payer: Medicare Other | Admitting: Physical Therapy

## 2017-08-30 ENCOUNTER — Inpatient Hospital Stay (HOSPITAL_COMMUNITY): Payer: Medicare Other

## 2017-08-30 ENCOUNTER — Inpatient Hospital Stay (HOSPITAL_COMMUNITY): Payer: Medicare Other | Admitting: Occupational Therapy

## 2017-08-30 LAB — SUSCEPTIBILITY, AER + ANAEROB

## 2017-08-30 LAB — GLUCOSE, CAPILLARY
GLUCOSE-CAPILLARY: 122 mg/dL — AB (ref 65–99)
GLUCOSE-CAPILLARY: 217 mg/dL — AB (ref 65–99)
Glucose-Capillary: 96 mg/dL (ref 65–99)
Glucose-Capillary: 82 mg/dL (ref 65–99)

## 2017-08-30 LAB — SUSCEPTIBILITY RESULT

## 2017-08-30 NOTE — Progress Notes (Signed)
Occupational Therapy Session Note  Patient Details  Name: Arthur Burke MRN: [id number] Date of Birth: 1966-01-03  Today's Date: 08/30/2017 OT Individual Time: 4098-1191 OT Individual Time Calculation (min): 77 min    Short Term Goals: Week 1:  OT Short Term Goal 1 (Week 1): STG=LTG  Skilled Therapeutic Interventions/Progress Updates:   Pt greeted supine in bed, declining bathing/dressing completion. Agreeable to tx. Session focus on endurance, UB strengthening, dynamic balance, and functional ambulation during community based IADL participation. After completing squat pivot to w/c (supervision for proper DME setup), pt self propelled to deli and cafe on 2nd floor of [employer]. He went up/down inclines and maneuvered through tight elevator/food service area spaces. Mod cues for carryover of w/c safety education when retrieving beverage from refrigerator. Pt changing directions and avoiding environmental barriers while navigating around display cases and selecting snacks with extra time and min vcs for safe reaching. After he paid for food items, pt ambulated over tile and carpet in [employer] area. Cues required for w/c parts mgt pre ambulation due to impaired vision. Pt transferring to couch, and after seated rest, completed power up without use of armrests and supervision. Pt requiring cues for DME placement during transfers. Afterwards pt self propelled back to room, transferred to bed. At end of tx pt was left with RN and family.   Therapy Documentation Precautions:  Precautions Precautions: Fall Precaution Comments: wound Vac in place Restrictions Weight Bearing Restrictions: Yes LLE Weight Bearing: Non weight bearing Vital Signs: Therapy Vitals Temp: 98 F (36.7 C) Temp Source: Oral Pulse Rate: 64 Resp: 19 BP: 116/83 Patient Position (if appropriate): Sitting Oxygen Therapy SpO2: 98 % O2 Device: Not Delivered Pain: No c/o pain during tx    ADL: ADL ADL Comments:  (P) see87functional navigator     See Function Navigator for Current Functional Status.   Therapy/Group: Individual Therapy  Havanah Nelms A Neveah Bang 08/30/2017, 3:48 PM

## 2017-08-30 NOTE — Progress Notes (Signed)
Physical Therapy Session Note  Patient Details  Name: [name] MRN: [id number] Date of Birth: [birth year]  Today's Date: 08/30/2017 PT Individual Time: 1000-1055 PT Individual Time Calculation (min): 55 min   Short Term Goals: Week 1:  PT Short Term Goal 1 (Week 1): stg=ltg dues to [facility]  Skilled Therapeutic Interventions/Progress Updates:   Pt sitting up in bed and agreeable to therapy, no c/o pain this session. Focused on functional transfers, w/c mobility, and gait in home-like environment. Pt transferred to w/c via squat pivot w/ supervision and pt self-propelled w/c throughout unit at Mod I level. Pt able to verbalize management of w/c parts w/o prompting and required no cues to manage parts safely. Practiced transfers to/from recliner and to/from bed w/ supervision. Discussed home set-up in pt's bedroom and managing w/c in bedroom-sized space w/ supervision. Pt able to reach to lock/unlock w/c from seated at EOB. Practiced w/c mobility and gait in home-like environment on compliant surface and navigating around cones w/ supervision. Pt requesting to self-stretch hip flexors and extensors on mat, educated pt on residual limb care in preparation for prosthesis training in future, provided w/ handout and demonstrated wrapping technique. Returned to room and pt transferred to EOB and to supine w/ supervision, ended session in supine w/ all needs in reach.    Therapy Documentation Precautions:  Precautions Precautions: Fall Precaution Comments: wound Vac in place Restrictions Weight Bearing Restrictions: Yes LLE Weight Bearing: Non weight bearing  See Function Navigator for Current Functional Status.   Therapy/Group: Individual Therapy  Buzz Axel K Arnette 08/30/2017, 11:00 AM

## 2017-08-30 NOTE — Progress Notes (Signed)
Occupational Therapy Session Note  Patient Details  Name: Arthur Burke MRN: [id number] Date of Birth: [birth year]  Today's Date: 08/30/2017 OT Individual Time: 1415-9733 OT Individual Time Calculation (min): 59 min    Short Term Goals: Week 1:  OT Short Term Goal 1 (Week 1): STG=LTG  Skilled Therapeutic Interventions/Progress Updates:    1:1. Pt verbose throughout session and requires min cues for continuing to complete tasks while talking. Pt completing stand pivot transfer with RW with supervision EOB>w/c, w/c<>EOM with supervision. Pt bathes at seated level leaning laterally with supervision and VC for locking breaks prior to reaching outside BOS/leaning for clothing management. Pt dons shirt with set up and pants with A to thread wound vac through pant leg. Pt sit to stand at sink with supervision to advance pants past hips. Pt propels w/c to/from all tx spaces for BUE strength and endurance. In tx gym seated on mat, pt completes 3x30 ball tosses (chest, bounce, and overhead pass) with 1.5# wrist weights on arms and cues to hold arms at 90 degrees isometric shoulder flexion in between passes. Pt states, "I like it, I feel the burn." Exited session with pt seated in bed, call light in reach and all needs met.  Therapy Documentation Precautions:  Precautions Precautions: Fall Precaution Comments: wound Vac in place Restrictions Weight Bearing Restrictions: Yes LLE Weight Bearing: Non weight bearing  See Function Navigator for Current Functional Status.   Therapy/Group: Individual Therapy  Tonny Branch 08/30/2017, 4:50 PM

## 2017-08-30 NOTE — Progress Notes (Signed)
Pt.  last BM 1/15. Pt on scheduled Senokot BID. Offered extra laxative to help move bowels but patient refused. He stated that he does not move his bowels everyday; sometimes it takes 5-6days and doctors are aware. Primary RN informed.

## 2017-08-30 NOTE — Progress Notes (Signed)
[facility] MEDICINE & REHABILITATION     PROGRESS NOTE  Subjective/Complaints:  Patient up in bed.  Upset that he got the wrong meal for breakfast but otherwise doing fairly well.  States that his pain is under better control as a whole  ROS: pt denies nausea, vomiting, diarrhea, cough, shortness of breath or chest pain .  Objective: Vital Signs: Blood pressure (!) 154/69, pulse 60, temperature 98.1 F (36.7 C), temperature source Oral, resp. rate 18, height 5\' 11"  (1.803 m), weight 79.4 kg (175 lb 0.7 oz), SpO2 97 %. No results found. Recent Labs    08/29/17 0717  WBC 16.7*  HGB 10.4*  HCT 32.1*  PLT 411*   Recent Labs    08/29/17 0717  NA 136  K 4.9  CL 98*  GLUCOSE 133*  BUN 29*  CREATININE 1.22  CALCIUM 9.7   CBG (last 3)  Recent Labs    08/29/17 1647 08/29/17 2057 08/30/17 0645  GLUCAP 131* 167* 96    Wt Readings from Last 3 Encounters:  08/27/17 79.4 kg (175 lb 0.7 oz)  08/21/17 80.6 kg (177 lb 11.1 oz)    Physical Exam:  BP (!) 154/69 (BP Location: Right Arm)   Pulse 60   Temp 98.1 F (36.7 C) (Oral)   Resp 18   Ht 5\' 11"  (1.803 m)   Wt 79.4 kg (175 lb 0.7 oz)   SpO2 97%   BMI 24.41 kg/m  Constitutional: He appears well-developed. NAD. HENT: Normocephalic. Atraumatic.  Eyes: EOM are normal. No discharge.  Cardiovascular: RRR without murmur. No JVD  Respiratory: CTA Bilaterally without wheezes or rales. Normal effort  GI: Bowel sounds are normal. He exhibits no distension.  Neurological: He is alert and oriented.  Motor: B/l UE 5/5 proximal to distal RLE: HF:4+ to 5/5 prox to distal LLE: HF 4+/5 HF, KE 4+/5 (some limitations by pain).  Skin. 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  No drainage through [facility] Psych: pleasant and appropriate.  Assessment/Plan: 1. Functional deficits secondary to left BKA which require 3+ hours per day of interdisciplinary therapy in a comprehensive inpatient rehab setting. Physiatrist is  providing close team supervision and 24 hour management of active medical problems listed below. Physiatrist and rehab team continue to assess barriers to discharge/monitor patient progress toward functional and medical goals.  Function:  Bathing Bathing position   Position: Wheelchair/chair at sink  Bathing parts Body parts bathed by patient: Right arm, Left arm, Chest, Abdomen, Right upper leg, Left upper leg, Front perineal area, Buttocks Body parts bathed by helper: Back  Bathing assist Assist Level: Touching or steadying assistance(Pt > 75%)      Upper Body Dressing/Undressing Upper body dressing   What is the patient wearing?: Pull over shirt/dress     Pull over shirt/dress - Perfomed by patient: Thread/unthread right sleeve, Thread/unthread left sleeve, Put head through opening, Pull shirt over trunk          Upper body assist Assist Level: Supervision or verbal cues      Lower Body Dressing/Undressing Lower body dressing   What is the patient wearing?: Pants Underwear - Performed by patient: Thread/unthread right underwear leg, Thread/unthread left underwear leg Underwear - Performed by helper: Pull underwear up/down Pants- Performed by patient: Thread/unthread right pants leg, Thread/unthread left pants leg, Pull pants up/down   Non-skid slipper socks- Performed by patient: Don/doff right sock  Lower body assist Assist for lower body dressing: Touching or steadying assistance (Pt > 75%)      Toileting Toileting   Toileting steps completed by patient: Adjust clothing prior to toileting, Performs perineal hygiene, Adjust clothing after toileting Toileting steps completed by helper: Adjust clothing prior to toileting, Performs perineal hygiene, Adjust clothing after toileting Toileting Assistive Devices: Grab bar or rail  Toileting assist Assist level: More than reasonable time   Transfers Chair/bed transfer   Chair/bed transfer method:  Squat pivot Chair/bed transfer assist level: Supervision or verbal cues Chair/bed transfer assistive device: Armrests, Medical sales representative     Max distance: 30' Assist level: Supervision or verbal cues   Wheelchair   Type: Manual Max wheelchair distance: 200' Assist Level: No help, No cues, assistive device, takes more than reasonable amount of time  Cognition Comprehension Comprehension assist level: Follows basic conversation/direction with no assist  Expression Expression assist level: Expresses basic needs/ideas: With no assist  Social Interaction Social Interaction assist level: Interacts appropriately with others - No medications needed.  Problem Solving Problem solving assist level: Solves basic problems with no assist  Memory Memory assist level: Complete Independence: No helper    Medical Problem List and Plan:  1. Decreased functional mobility secondary to left BKA 08/23/2017    Continue CIR 2. DVT Prophylaxis/Anticoagulation: SCDs right lower extremity  3. Pain Management/chronic pain: Oxycodone immediate release 15 mg every 4 hours as prior to admission, low dose oxycodone immediate release 5 mg every 4 hours as needed and Robaxin as needed. Scheduled gabapentin for phantom/neuropathic pain.  4. Mood: Provide emotional support  5. Neuropsych: This patient is capable of making decisions on his own behalf.  6. Skin/Wound Care: Routine skin checks.   Remove VAC today and apply dry dressing to wound 7. Fluids/Electrolytes/Nutrition: Routine I&O's  8. Hypertension. Coreg 25 mg twice a day, clonidine 0.1 mg twice a day, Norvasc 10 mg daily, Altace 20 mg daily    Fair control at present.  Certainly pain has a impact as well   Continue to monitor for trend 9. Diabetes mellitus peripheral neuropathy. Hemoglobin A1c 8.6. Lantus insulin 32 units daily, NovoLog 10 units 3 times a day with meals. Check blood sugars before meals and at bedtime. Diabetic teaching     Appears to be stabilizing on 1/18 10. Renal and pancreas transplant recipient. Continue home immunosuppressive meds    With AKI, Cr 1.22 on 08/29/2017   Cont to monitor 11. Acute blood loss anemia.    Hb 10.4 on 08/29/2017   H cont to monitor 12. Constipation. Laxative assistance  13. Hyperlipidemia. Lipitor  14. Hyponatremia   Na 136 on 08/29/2017    15. Leukocytosis   WBCs up to 16.7 on 08/29/2017   No obvious signs of infection on examination.      Recheck CBC on Monday   LOS (Days) 4 A FACE TO FACE EVALUATION WAS PERFORMED  SWARTZ,ZACHARY T 08/30/2017 8:14 AM

## 2017-08-31 ENCOUNTER — Inpatient Hospital Stay (HOSPITAL_COMMUNITY): Payer: Medicare Other

## 2017-08-31 LAB — AEROBIC CULTURE W GRAM STAIN (SUPERFICIAL SPECIMEN)

## 2017-08-31 LAB — GLUCOSE, CAPILLARY
GLUCOSE-CAPILLARY: 161 mg/dL — AB (ref 65–99)
Glucose-Capillary: 72 mg/dL (ref 65–99)
Glucose-Capillary: 187 mg/dL — ABNORMAL HIGH (ref 65–99)
Glucose-Capillary: 187 mg/dL — ABNORMAL HIGH (ref 65–99)

## 2017-08-31 LAB — AEROBIC CULTURE  (SUPERFICIAL SPECIMEN)

## 2017-08-31 NOTE — Progress Notes (Signed)
[facility] PHYSICAL MEDICINE & REHABILITATION     PROGRESS NOTE  Subjective/Complaints:  Patient up in bed eating breakfast.  No new issues.  Tolerated removal of VAC from stump quite well.  Pain seems under control at present  ROS: pt denies nausea, vomiting, diarrhea, cough, shortness of breath or chest pain    Objective: Vital Signs: Blood pressure 125/61, pulse (!) 58, temperature 97.8 F (36.6 C), temperature source Oral, resp. rate 18, height 5\' 11"  (1.803 m), weight 79.4 kg (175 lb 0.7 oz), SpO2 97 %. No results found. Recent Labs    08/29/17 0717  WBC 16.7*  HGB 10.4*  HCT 32.1*  PLT 411*   Recent Labs    08/29/17 0717  NA 136  K 4.9  CL 98*  GLUCOSE 133*  BUN 29*  CREATININE 1.22  CALCIUM 9.7   CBG (last 3)  Recent Labs    08/30/17 1721 08/30/17 2058 08/31/17 0633  GLUCAP 122* 217* 72    Wt Readings from Last 3 Encounters:  08/27/17 79.4 kg (175 lb 0.7 oz)  08/21/17 80.6 kg (177 lb 11.1 oz)    Physical Exam:  BP 125/61 (BP Location: Right Arm)   Pulse (!) 58   Temp 97.8 F (36.6 C) (Oral)   Resp 18   Ht 5\' 11"  (1.803 m)   Wt 79.4 kg (175 lb 0.7 oz)   SpO2 97%   BMI 24.41 kg/m  Constitutional: He appears well-developed. NAD. HENT: Normocephalic. Atraumatic.  Eyes: EOM are normal. No discharge.  Cardiovascular: RRR without murmur. No JVD  Respiratory: CTA Bilaterally without wheezes or rales. Normal effort   GI: Bowel sounds are normal. He exhibits no distension.  Neurological: He is alert and oriented.  Motor: B/l UE 5/5 proximal to distal RLE: HF:4+ to 5/5 prox to distal LLE: HF 4+/5 HF, KE 4+/5 (some limitations by pain).  Skin.  Left BKA incision clean and intact with minimal serous drainage.  There is one piece of gauze which is strongly adhered to the skin, likely due to adhesive from [facility] dressing. Psych: pleasant and appropriate.  Assessment/Plan: 1. Functional deficits secondary to left BKA which require 3+ hours per day of  interdisciplinary therapy in a comprehensive inpatient rehab setting. Physiatrist is providing close team supervision and 24 hour management of active medical problems listed below. Physiatrist and rehab team continue to assess barriers to discharge/monitor patient progress toward functional and medical goals.  Function:  Bathing Bathing position   Position: Wheelchair/chair at sink  Bathing parts Body parts bathed by patient: Right arm, Left arm, Chest, Abdomen, Right upper leg, Left upper leg, Front perineal area, Buttocks Body parts bathed by helper: Back  Bathing assist Assist Level: Touching or steadying assistance(Pt > 75%)      Upper Body Dressing/Undressing Upper body dressing   What is the patient wearing?: Pull over shirt/dress     Pull over shirt/dress - Perfomed by patient: Thread/unthread right sleeve, Thread/unthread left sleeve, Put head through opening, Pull shirt over trunk          Upper body assist Assist Level: Supervision or verbal cues      Lower Body Dressing/Undressing Lower body dressing   What is the patient wearing?: Pants Underwear - Performed by patient: Thread/unthread right underwear leg, Thread/unthread left underwear leg Underwear - Performed by helper: Pull underwear up/down Pants- Performed by patient: Thread/unthread right pants leg, Thread/unthread left pants leg, Pull pants up/down   Non-skid slipper socks- Performed by patient: Don/doff right  sock                    Lower body assist Assist for lower body dressing: Touching or steadying assistance (Pt > 75%)      Toileting Toileting   Toileting steps completed by patient: Adjust clothing prior to toileting, Performs perineal hygiene, Adjust clothing after toileting Toileting steps completed by helper: Adjust clothing prior to toileting, Performs perineal hygiene, Adjust clothing after toileting Toileting Assistive Devices: Grab bar or rail  Toileting assist Assist level:  Touching or steadying assistance (Pt.75%)   Transfers Chair/bed transfer   Chair/bed transfer method: Squat pivot Chair/bed transfer assist level: Supervision or verbal cues Chair/bed transfer assistive device: Armrests, Medical sales representative     Max distance: 30' Assist level: Supervision or verbal cues   Wheelchair   Type: Manual Max wheelchair distance: 150' Assist Level: No help, No cues, assistive device, takes more than reasonable amount of time  Cognition Comprehension Comprehension assist level: Follows basic conversation/direction with no assist  Expression Expression assist level: Expresses basic needs/ideas: With no assist  Social Interaction Social Interaction assist level: Interacts appropriately with others - No medications needed.  Problem Solving Problem solving assist level: Solves basic problems with no assist  Memory Memory assist level: Complete Independence: No helper    Medical Problem List and Plan:  1. Decreased functional mobility secondary to left BKA 08/23/2017    Continue CIR 2. DVT Prophylaxis/Anticoagulation: SCDs right lower extremity  3. Pain Management/chronic pain: Oxycodone immediate release 15 mg every 4 hours as prior to admission, low dose oxycodone immediate release 5 mg every 4 hours as needed and Robaxin as needed. Scheduled gabapentin for phantom/neuropathic pain.  4. Mood: Provide emotional support  5. Neuropsych: This patient is capable of making decisions on his own behalf.  6. Skin/Wound Care: Routine skin checks.   VAC removed yesterday.  Incision looks good.  Add oil immersion dressing to incision to prevent adherence of the dry dressing to the incision.  Otherwise continue dry dressing and compression with Ace wrap as well 7. Fluids/Electrolytes/Nutrition: Routine I&O's  8. Hypertension. Coreg 25 mg twice a day, clonidine 0.1 mg twice a day, Norvasc 10 mg daily, Altace 20 mg daily    Improved control today 08/31/2017   9. Diabetes mellitus peripheral neuropathy. Hemoglobin A1c 8.6. Lantus insulin 32 units daily, NovoLog 10 units 3 times a day with meals. Check blood sugars before meals and at bedtime. Diabetic teaching    Some lability but otherwise in improving control 08/31/2017.  Cover spikes with sliding scale insulin for now 10. Renal and pancreas transplant recipient. Continue home immunosuppressive meds    With AKI, Cr 1.22 on 08/29/2017   Cont to monitor 11. Acute blood loss anemia.    Hb 10.4 on 08/29/2017   H cont to monitor 12. Constipation. Laxative assistance  13. Hyperlipidemia. Lipitor  14. Hyponatremia   Na 136 on 08/29/2017    15. Leukocytosis   WBCs up to 16.7 on 08/29/2017   No obvious signs of infection on examination.      Recheck CBC on Monday   LOS (Days) 5 A FACE TO FACE EVALUATION WAS PERFORMED  Linley Moskal Lovins T 08/31/2017 7:59 AM

## 2017-08-31 NOTE — Progress Notes (Signed)
Occupational Therapy Session Note  Patient Details  Name: Arthur Burke MRN: [id number] Date of Birth: 1966/04/29  Today's Date: 08/31/2017 OT Individual Time: 2903-7955 OT Individual Time Calculation (min): 58 min    Short Term Goals: Week 1:  OT Short Term Goal 1 (Week 1): STG=LTG  Skilled Therapeutic Interventions/Progress Updates:    1:1. Pt with no c/o pain. Pt wants to go off unit to deli/cafeteria to purchase snacks. Pt propels w/c to/from all locations in hospital with cueing for steering techniques in tight places, hemi technique while carrying items in cafeteria, and backing into elevators. Pt requires min cueing to use external aides to locate both cafeteria, deli, and room. Pt manages money with increased time and cueing to use feeling/size to distinguish coins for money management (rough v smooth edge on nickel). Pt returned to room with call light in reach all needs met and NT in room.  Therapy Documentation Precautions:  Precautions Precautions: Fall Precaution Comments: wound Vac in place Restrictions Weight Bearing Restrictions: Yes LLE Weight Bearing: Non weight bearing  See Function Navigator for Current Functional Status.   Therapy/Group: Individual Therapy  Tonny Branch 08/31/2017, 4:48 PM

## 2017-09-01 ENCOUNTER — Inpatient Hospital Stay (HOSPITAL_COMMUNITY): Payer: Medicare Other | Admitting: Occupational Therapy

## 2017-09-01 ENCOUNTER — Inpatient Hospital Stay (HOSPITAL_COMMUNITY): Payer: Medicare Other

## 2017-09-01 LAB — CBC
HCT: 33.5 % — ABNORMAL LOW (ref 39.0–52.0)
Hemoglobin: 11 g/dL — ABNORMAL LOW (ref 13.0–17.0)
MCH: 28.6 pg (ref 26.0–34.0)
MCHC: 32.8 g/dL (ref 30.0–36.0)
MCV: 87.2 fL (ref 78.0–100.0)
Platelets: 386 10*3/uL (ref 150–400)
RBC: 3.84 MIL/uL — AB (ref 4.22–5.81)
RDW: 13.4 % (ref 11.5–15.5)
WBC: 15.5 10*3/uL — AB (ref 4.0–10.5)

## 2017-09-01 LAB — GLUCOSE, CAPILLARY
GLUCOSE-CAPILLARY: 99 mg/dL (ref 65–99)
GLUCOSE-CAPILLARY: 79 mg/dL (ref 65–99)
GLUCOSE-CAPILLARY: 125 mg/dL — AB (ref 65–99)
Glucose-Capillary: 221 mg/dL — ABNORMAL HIGH (ref 65–99)

## 2017-09-01 NOTE — Progress Notes (Signed)
Physical Therapy Discharge Summary  Patient Details  Name: Arthur Burke MRN: [id number] Date of Birth: 07-08-1966  Today's Date: 09/01/2017 PT Individual Time: 0800-0910 PT Individual Time Calculation (min): 70 min    Patient has met 7 of 7 long term goals due to improved activity tolerance, improved balance, increased strength and decreased pain.  Patient to discharge at a wheelchair level Modified Independent.   Patient's mother will provided some supervision upon d/c however pt is Mod I for transfers and w/c propulsion.   All goals met.   Recommendation:  Patient will benefit from ongoing skilled PT services in home health setting to continue to advance safe functional mobility, address ongoing impairments in strength, balance, endurance, ROM, and minimize fall risk.  Equipment: 18x18 w/c  Reasons for discharge: treatment goals met  Patient/family agrees with progress made and goals achieved: Yes   PT Treatment Interventions: Pt supine in bed upon PT arrival, agreeable to therapy tx and reports pain as describes below. Pt transferred from supine>sitting Mod I. Reviewed w/c parts management and answered pt questions, discussed desensitization and importance of wearing ace wrap of stump shrinker for limb shaping. Pt transferred bed<>w/c mod I, squat pivot. Pt propelled w/c throughout unit and on incline/decliner Mod I. Pt performed car transfer with set up. Pt ascended/decended single step with B handrails and supervision. Pt ambulated x 65 ft with RW and supervision. Reviewed pt's HEP and provided hand out. Pt performed standing hip abduction and hip extension 2 x 10 each on L LE with RW, therapist held theraband for added resistance for LE strengthening. Discussed importance of maintaining L LE strength and flexibility for pts goal of working towards a prosthesis. Pt propelled w/c back to room and transferred back to bed Mod I. Pt left seated EOB with needs in reach.   PT  Discharge Precautions/Restrictions Precautions Precautions: Fall Restrictions Weight Bearing Restrictions: Yes LLE Weight Bearing: Non weight bearing Pain Pain Assessment Pain Assessment: 0-10 Pain Score: 5  Pain Type: Chronic pain;Surgical pain Pain Location: Leg Pain Orientation: Left;Right Pain Descriptors / Indicators: Burning;Tingling Pain Frequency: Intermittent Pain Onset: On-going Pain Intervention(s): Medication (See eMAR) Cognition Overall Cognitive Status: Within Functional Limits for tasks assessed Arousal/Alertness: Awake/alert Orientation Level: Oriented X4 Focused Attention: Appears intact Sustained Attention: Appears intact Memory: Appears intact Awareness: Appears intact Problem Solving: Appears intact Safety/Judgment: Appears intact Sensation Sensation Light Touch: Appears Intact Proprioception: Appears Intact Additional Comments: grossly intact B LEs Coordination Gross Motor Movements are Fluid and Coordinated: No Fine Motor Movements are Fluid and Coordinated: Yes Coordination and Movement Description: coordination impaired in B LEs secondary to pain and L BKA Motor  Motor Motor: Within Functional Limits Motor - Skilled Clinical Observations: generalized weakness and acute pain  Trunk/Postural Assessment  Cervical Assessment Cervical Assessment: Within Functional Limits Thoracic Assessment Thoracic Assessment: Within Functional Limits Lumbar Assessment Lumbar Assessment: Within Functional Limits Postural Control Postural Control: Within Functional Limits  Balance Balance Balance Assessed: Yes Static Sitting Balance Static Sitting - Level of Assistance: 6: Modified independent (Device/Increase time) Dynamic Sitting Balance Dynamic Sitting - Level of Assistance: 6: Modified independent (Device/Increase time) Static Standing Balance Static Standing - Level of Assistance: 6: Modified independent (Device/Increase time) Dynamic Standing  Balance Dynamic Standing - Level of Assistance: 6: Modified independent (Device/Increase time) Extremity Assessment  RLE Assessment RLE Assessment: Within Functional Limits LLE Assessment LLE Assessment: Exceptions to WFL(grossly 4/5 at hip and knee, limited knee ROM)   See Function Navigator for Current Functional Status.  Raquel Sarna  Enid Skeens, PT, DPT 09/01/2017, 8:11 AM

## 2017-09-01 NOTE — Progress Notes (Signed)
Occupational Therapy Session Note  Patient Details  Name: Arthur Burke MRN: [id number] Date of Birth: [birth year]  Today's Date: 09/01/2017 OT Individual Time: 1000-1100 OT Individual Time Calculation (min): 60 min    Short Term Goals: Week 1:  OT Short Term Goal 1 (Week 1): STG=LTG  Skilled Therapeutic Interventions/Progress Updates:    Pt completed shower and dressing during session.  He was able to transfer to and from the shower with use of the RW and modified independence.  He completed dressing sit to stand on a 3:1 in the bathroom as well.  Finished session with transfer back out to the EOB with modified independence using the RW for support.  Pt left with call button and phone in reach and safety belt in place.  Discussed need for shower/tub bench and 3:1 for home.    Therapy Documentation Precautions:  Precautions Precautions: Fall Precaution Comments:   Restrictions Weight Bearing Restrictions: Yes LLE Weight Bearing: Non weight bearing  Pain: Pain Assessment Faces Pain Scale: No hurt ADL: See Function Navigator for Current Functional Status.   Therapy/Group: Individual Therapy  [name] OTR/L 09/01/2017, 12:30 PM

## 2017-09-01 NOTE — Progress Notes (Addendum)
Occupational Therapy Discharge Summary  Patient Details  Name: Arthur Burke MRN: [id number] Date of Birth: [birth year]  Today's Date: 09/01/2017 OT Individual Time: 1300-1400 OT Individual Time Calculation (min): 60 min   Session Note:  Pt completed functional transfers to the tub/shower bench, 3:1, and recliner during session.  Also completed functional mobility in the ADL apartment with use of the RW for safety.  Pt overall modified independent for these tasks.  Discussed avoiding hopping backwards with the RW and to instead pivot out of spots when needed.  Also discussed use of the wheelchair in the kitchen in order to transport meals to the living room where he uses a TV tray mostly.  He stated that he had access to a rollator but therapist stated he needed to refrain from using this until [facility] could further evaluate.     Patient has met 10 of 10 long term goals due to improved balance and ability to compensate for deficits.  Patient to discharge at overall Modified Independent level.  Patient's care partner unavailable to provide the necessary physical assistance at discharge.    Reasons goals not met: NA  Recommendation:  Patient will benefit from ongoing skilled OT services in home health setting to continue to advance functional skills in the area of BADL and Reduce care partner burden.  Pt will benefit from [facility] eval for safety and function in the home environment as his mother cannot provide any physical assist and pt may have some environmental barriers that could not be simulated in the hospital environment based on setup.   Equipment: tub bench and drop arm commode  Reasons for discharge: treatment goals met and discharge from hospital  Patient/family agrees with progress made and goals achieved: Yes  OT Discharge Precautions/Restrictions  Precautions Precautions: Fall Precaution Comments:   Restrictions Weight Bearing Restrictions: Yes LLE Weight Bearing: Non weight  bearing  Pain Pain Assessment Pain Assessment: Faces Faces Pain Scale: Hurts a little bit Pain Type: Acute pain Pain Location: Leg Pain Orientation: Left Pain Descriptors / Indicators: Burning;Tingling Pain Onset: On-going Pain Intervention(s): Emotional support;Repositioned ADL ADL ADL Comments: (P) see11fnctional navigator Vision Baseline Vision/History: Wears glasses;Cataracts Wears Glasses: Reading only Patient Visual Report: No change from baseline(Pt with decreased visual acuity) Perception  Perception: Within Functional Limits Praxis Praxis: Intact Cognition Overall Cognitive Status: Within Functional Limits for tasks assessed Arousal/Alertness: Awake/alert Orientation Level: Oriented X4 Attention: Sustained;Focused Focused Attention: Appears intact Sustained Attention: Appears intact Memory: Appears intact Awareness: Appears intact Problem Solving: Appears intact Safety/Judgment: Appears intact Sensation Sensation Light Touch: Appears Intact Stereognosis: Appears Intact Hot/Cold: Appears Intact Proprioception: Appears Intact Additional Comments: Sensation intact in BUEs Coordination Gross Motor Movements are Fluid and Coordinated: Yes Fine Motor Movements are Fluid and Coordinated: Yes Coordination and Movement Description: BUE coordination WFLs Motor  Motor Motor - Discharge Observations: generalized weakness and acute pain Mobility    See Function Section of chart for details  Trunk/Postural Assessment  Cervical Assessment Cervical Assessment: Within Functional Limits Thoracic Assessment Thoracic Assessment: Within Functional Limits Lumbar Assessment Lumbar Assessment: Within Functional Limits Postural Control Postural Control: Within Functional Limits  Balance Balance Balance Assessed: Yes Static Sitting Balance Static Sitting - Level of Assistance: 6: Modified independent (Device/Increase time) Dynamic Sitting Balance Dynamic Sitting -  Balance Support: During functional activity Dynamic Sitting - Level of Assistance: 6: Modified independent (Device/Increase time) Static Standing Balance Static Standing - Balance Support: During functional activity Static Standing - Level of Assistance: 6: Modified independent (Device/Increase time) Dynamic  Standing Balance Dynamic Standing - Balance Support: During functional activity Dynamic Standing - Level of Assistance: 6: Modified independent (Device/Increase time) Extremity/Trunk Assessment RUE Assessment RUE Assessment: Within Functional Limits LUE Assessment LUE Assessment: Within Functional Limits   See Function Navigator for Current Functional Status.  Oday Ridings OTR/L 09/01/2017, 4:03 PM

## 2017-09-01 NOTE — Progress Notes (Signed)
[place] PHYSICAL MEDICINE & REHABILITATION     PROGRESS NOTE  Subjective/Complaints:  Pt seen sitting up in bed this AM, eating breakfast.  He is in much better spirits and has questions about discharge tomorrow.  ROS: Denies nausea, vomiting, diarrhea, shortness of breath or chest pain    Objective: Vital Signs: Blood pressure (!) 149/65, pulse 61, temperature 97.6 F (36.4 C), temperature source Oral, resp. rate 18, height 5\' 11"  (1.803 m), weight 79.4 kg (175 lb 0.7 oz), SpO2 97 %. No results found. No results for input(s): WBC, HGB, HCT, PLT in the last 72 hours. No results for input(s): NA, K, CL, GLUCOSE, BUN, CREATININE, CALCIUM in the last 72 hours.  Invalid input(s): CO CBG (last 3)  Recent Labs    08/31/17 1642 08/31/17 2055 09/01/17 0630  GLUCAP 161* 187* 99    Wt Readings from Last 3 Encounters:  08/27/17 79.4 kg (175 lb 0.7 oz)  08/21/17 80.6 kg (177 lb 11.1 oz)    Physical Exam:  BP (!) 149/65 (BP Location: Right Arm)   Pulse 61   Temp 97.6 F (36.4 C) (Oral)   Resp 18   Ht 5\' 11"  (1.803 m)   Wt 79.4 kg (175 lb 0.7 oz)   SpO2 97%   BMI 24.41 kg/m  Constitutional: He appears well-developed. NAD. HENT: Normocephalic. Atraumatic.  Eyes: EOM are normal. No discharge.  Cardiovascular: RRR. No JVD  Respiratory: CTA Bilaterally. Normal effort   GI: Bowel sounds are normal. He exhibits no distension.  Neurological: He is alert and oriented.  Motor: B/l UE 5/5 proximal to distal RLE: HF: 5/5 prox to distal LLE: HF 4+-5/5 HF, KE 4+-5/5 (some limitations by pain).  Skin.  Left BKA incision c/d/i.  Psych: pleasant and appropriate. Normal mood and behavior.   Assessment/Plan: 1. Functional deficits secondary to left BKA which require 3+ hours per day of interdisciplinary therapy in a comprehensive inpatient rehab setting. Physiatrist is providing close team supervision and 24 hour management of active medical problems listed below. Physiatrist and rehab  team continue to assess barriers to discharge/monitor patient progress toward functional and medical goals.  Function:  Bathing Bathing position   Position: Wheelchair/chair at sink  Bathing parts Body parts bathed by patient: Right arm, Left arm, Chest, Abdomen, Right upper leg, Left upper leg, Front perineal area, Buttocks Body parts bathed by helper: Back  Bathing assist Assist Level: Touching or steadying assistance(Pt > 75%)      Upper Body Dressing/Undressing Upper body dressing   What is the patient wearing?: Pull over shirt/dress     Pull over shirt/dress - Perfomed by patient: Thread/unthread right sleeve, Thread/unthread left sleeve, Put head through opening, Pull shirt over trunk          Upper body assist Assist Level: Supervision or verbal cues      Lower Body Dressing/Undressing Lower body dressing   What is the patient wearing?: Pants Underwear - Performed by patient: Thread/unthread right underwear leg, Thread/unthread left underwear leg Underwear - Performed by helper: Pull underwear up/down Pants- Performed by patient: Thread/unthread right pants leg, Thread/unthread left pants leg, Pull pants up/down   Non-skid slipper socks- Performed by patient: Don/doff right sock                    Lower body assist Assist for lower body dressing: Touching or steadying assistance (Pt > 75%)      Toileting Toileting   Toileting steps completed by patient: Adjust clothing  prior to toileting, Performs perineal hygiene, Adjust clothing after toileting Toileting steps completed by helper: Adjust clothing prior to toileting, Performs perineal hygiene, Adjust clothing after toileting Toileting Assistive Devices: Grab bar or rail  Toileting assist Assist level: Touching or steadying assistance (Pt.75%)   Transfers Chair/bed transfer   Chair/bed transfer method: Squat pivot Chair/bed transfer assist level: Supervision or verbal cues Chair/bed transfer assistive  device: Armrests, Medical sales representative     Max distance: 30' Assist level: Supervision or verbal cues   Wheelchair   Type: Manual Max wheelchair distance: 150' Assist Level: No help, No cues, assistive device, takes more than reasonable amount of time  Cognition Comprehension Comprehension assist level: Follows basic conversation/direction with no assist  Expression Expression assist level: Expresses basic needs/ideas: With no assist  Social Interaction Social Interaction assist level: Interacts appropriately with others - No medications needed.  Problem Solving Problem solving assist level: Solves basic problems with no assist  Memory Memory assist level: Complete Independence: No helper    Medical Problem List and Plan:  1. Decreased functional mobility secondary to left BKA 08/23/2017    Continue CIR 2. DVT Prophylaxis/Anticoagulation: SCDs right lower extremity  3. Pain Management/chronic pain: Oxycodone immediate release 15 mg every 4 hours as prior to admission, low dose oxycodone immediate release 5 mg every 4 hours as needed and Robaxin as needed. Scheduled gabapentin for phantom/neuropathic pain.    Controlled at present 4. Mood: Provide emotional support  5. Neuropsych: This patient is capable of making decisions on his own behalf.  6. Skin/Wound Care: Routine skin checks.   VAC removed, incision healing 7. Fluids/Electrolytes/Nutrition: Routine I&O's  8. Hypertension. Coreg 25 mg twice a day, clonidine 0.1 mg twice a day, Norvasc 10 mg daily, Altace 20 mg daily    Relatively control on 1/21 9. Diabetes mellitus peripheral neuropathy. Hemoglobin A1c 8.6. Lantus insulin 32 units daily, NovoLog 10 units 3 times a day with meals. Check blood sugars before meals and at bedtime. Diabetic teaching    Relatively controlled on 1/21 10. Renal and pancreas transplant recipient. Continue home immunosuppressive meds    With AKI, Cr 1.22 on 08/29/2017   Cont to  monitor 11. Acute blood loss anemia.    Hb 11.0 on 1/21   H cont to monitor 12. Constipation. Laxative assistance  13. Hyperlipidemia. Lipitor  14. Hyponatremia: Resolved   Na 136 on 08/29/2017 15. Leukocytosis   WBCs 15.5 on 1/21, improving   Afebrile   No obvious signs of infection on examination.    LOS (Days) 6 A FACE TO FACE EVALUATION WAS PERFORMED  Samson Ralph Lorie Phenix 09/01/2017 8:18 AM

## 2017-09-01 NOTE — Discharge Instructions (Signed)
Inpatient Rehab Discharge Instructions  Arthur Burke Discharge date and time: No discharge date for patient encounter.   Activities/Precautions/ Functional Status: Activity: activity as tolerated Diet: diabetic diet Wound Care: keep wound clean and dry Functional status:  ___ No restrictions     ___ Walk up steps independently ___ 24/7 supervision/assistance   ___ Walk up steps with assistance ___ Intermittent supervision/assistance  ___ Bathe/dress independently ___ Walk with walker     _x__ Bathe/dress with assistance ___ Walk Independently    ___ Shower independently ___ Walk with assistance    ___ Shower with assistance ___ No alcohol     ___ Return to work/school ________  Special Instructions:    COMMUNITY REFERRALS UPON DISCHARGE:    Home Health:   PT & RN   [place] [facility]   Date of last service:09/02/2017  Medical Equipment/Items Ordered:3 IN 1, WHEELCHAIR & TUB BENCH WILL GET [name]  Agency/Supplier:[facility]   [phone]   GENERAL COMMUNITY RESOURCES FOR PATIENT/FAMILY: Support Groups:[facility] SECOND TUESDAY @ 7:00-8:30 PM AT [place] [phone]  My questions have been answered and I understand these instructions. I will adhere to these goals and the provided educational materials after my discharge from the hospital.  Patient/Caregiver Signature _______________________________ Date __________  Clinician Signature _______________________________________ Date __________  Please bring this form and your medication list with you to all your follow-up doctor's appointments.

## 2017-09-01 NOTE — Progress Notes (Signed)
Social Work Patient ID: Arthur Burke, male   DOB: 1965-10-09, 52 y.o.   MRN: [id number]  Met with pt to discuss discharge tomorrow. His equipment will be delivered in am, he plans on getting a rolling walker on his own since not covered by medicare. Home Health set up via [facility] to continue therapies. He feels ready to go home tomorrow. See in am for any other questions. He plans on going to his clinic MD's and not interested in getting PCP local.

## 2017-09-01 NOTE — Discharge Summary (Signed)
Discharge summary job # [id number]

## 2017-09-02 DIAGNOSIS — W19XXXA Unspecified fall, initial encounter: Secondary | ICD-10-CM

## 2017-09-02 LAB — GLUCOSE, CAPILLARY: Glucose-Capillary: 137 mg/dL — ABNORMAL HIGH (ref 65–99)

## 2017-09-02 MED ORDER — RAMIPRIL 10 MG PO CAPS: 20.0000 mg | ORAL_CAPSULE | Freq: Every day | ORAL | 0 refills | Status: DC

## 2017-09-02 MED ORDER — INSULIN ASPART 100 UNIT/ML FLEXPEN: 10.0000 [IU] | PEN_INJECTOR | Freq: Three times a day (TID) | SUBCUTANEOUS | 11 refills | Status: DC

## 2017-09-02 MED ORDER — CALCIUM 600-200 MG-UNIT PO TABS: 1.0000 | ORAL_TABLET | Freq: Every day | ORAL | 0 refills | Status: DC

## 2017-09-02 MED ORDER — OXYCODONE HCL 5 MG PO TABS: 5.0000 mg | ORAL_TABLET | ORAL | 0 refills | Status: DC | PRN

## 2017-09-02 MED ORDER — METHOCARBAMOL 500 MG PO TABS: 500.0000 mg | ORAL_TABLET | Freq: Four times a day (QID) | ORAL | 0 refills | Status: DC | PRN

## 2017-09-02 MED ORDER — CARVEDILOL 25 MG PO TABS: 25.0000 mg | ORAL_TABLET | Freq: Two times a day (BID) | ORAL | 0 refills | Status: DC

## 2017-09-02 MED ORDER — AMLODIPINE BESYLATE 10 MG PO TABS: 10.0000 mg | ORAL_TABLET | Freq: Every day | ORAL | 0 refills | Status: DC

## 2017-09-02 MED ORDER — VITAMIN B-12 500 MCG PO TABS: 500.0000 ug | ORAL_TABLET | Freq: Every day | ORAL | 0 refills | Status: DC

## 2017-09-02 MED ORDER — ATORVASTATIN CALCIUM 40 MG PO TABS: 40.0000 mg | ORAL_TABLET | Freq: Every evening | ORAL | 0 refills | Status: DC

## 2017-09-02 MED ORDER — OMEPRAZOLE 20 MG PO CPDR: 20.0000 mg | DELAYED_RELEASE_CAPSULE | Freq: Two times a day (BID) | ORAL | 11 refills | Status: DC

## 2017-09-02 MED ORDER — CLONIDINE HCL 0.1 MG PO TABS: 0.1000 mg | ORAL_TABLET | Freq: Two times a day (BID) | ORAL | 11 refills | Status: DC

## 2017-09-02 MED ORDER — INSULIN GLARGINE 100 UNITS/ML SOLOSTAR PEN: 32.0000 [IU] | PEN_INJECTOR | Freq: Every day | SUBCUTANEOUS | 11 refills | Status: DC

## 2017-09-02 MED ORDER — CALCIUM ASCORBATE 500 MG PO TABS: 500.0000 mg | ORAL_TABLET | Freq: Every day | ORAL | 0 refills | Status: DC

## 2017-09-02 MED ORDER — RAMELTEON 8 MG PO TABS: 8.0000 mg | ORAL_TABLET | Freq: Every day | ORAL | 0 refills | Status: DC

## 2017-09-02 NOTE — Progress Notes (Signed)
[facility] PHYSICAL MEDICINE & REHABILITATION     PROGRESS NOTE  Subjective/Complaints:  Pt seen sitting up in bed this AM.  He slept well overnight, but notes that he had a fall early this AM going to the bathroom.  He has several questions about discharge.   ROS: Denies nausea, vomiting, diarrhea, shortness of breath or chest pain    Objective: Vital Signs: Blood pressure (!) 153/67, pulse 63, temperature 97.7 F (36.5 C), temperature source Axillary, resp. rate 16, height 5\' 11"  (1.803 m), weight 79.4 kg (175 lb 0.7 oz), SpO2 97 %. No results found. Recent Labs    09/01/17 0634  WBC 15.5*  HGB 11.0*  HCT 33.5*  PLT 386   No results for input(s): NA, K, CL, GLUCOSE, BUN, CREATININE, CALCIUM in the last 72 hours.  Invalid input(s): CO CBG (last 3)  Recent Labs    09/01/17 1639 09/01/17 2059 09/02/17 0640  GLUCAP 125* 221* 137*    Wt Readings from Last 3 Encounters:  08/27/17 79.4 kg (175 lb 0.7 oz)  08/21/17 80.6 kg (177 lb 11.1 oz)    Physical Exam:  BP (!) 153/67 (BP Location: Right Arm)   Pulse 63   Temp 97.7 F (36.5 C) (Axillary)   Resp 16   Ht 5\' 11"  (1.803 m)   Wt 79.4 kg (175 lb 0.7 oz)   SpO2 97%   BMI 24.41 kg/m  Constitutional: He appears well-developed. NAD. HENT: Normocephalic. Atraumatic.  Eyes: EOM are normal. No discharge.  Cardiovascular: RRR. No JVD  Respiratory: CTA Bilaterally. Normal effort   GI: Bowel sounds are normal. He exhibits no distension.  Neurological: He is alert and oriented.  Motor: B/l UE 5/5 proximal to distal RLE: HF: 5/5 prox to distal LLE: HF 4+-5/5 HF, KE 4+-5/5 (some limitations by pain).  Skin.  Left BKA incision with serosanguinous drainage along lateral incision.  Psych: pleasant and appropriate. Normal mood and behavior.   Assessment/Plan: 1. Functional deficits secondary to left BKA which require 3+ hours per day of interdisciplinary therapy in a comprehensive inpatient rehab setting. Physiatrist is  providing close team supervision and 24 hour management of active medical problems listed below. Physiatrist and rehab team continue to assess barriers to discharge/monitor patient progress toward functional and medical goals.  Function:  Bathing Bathing position   Position: Shower  Bathing parts Body parts bathed by patient: Right arm, Left arm, Chest, Abdomen, Right upper leg, Left upper leg, Front perineal area, Buttocks, Back, Right lower leg Body parts bathed by helper: Back  Bathing assist Assist Level: More than reasonable time      Upper Body Dressing/Undressing Upper body dressing   What is the patient wearing?: Pull over shirt/dress     Pull over shirt/dress - Perfomed by patient: Thread/unthread right sleeve, Thread/unthread left sleeve, Put head through opening, Pull shirt over trunk          Upper body assist Assist Level: More than reasonable time      Lower Body Dressing/Undressing Lower body dressing   What is the patient wearing?: Pants Underwear - Performed by patient: Thread/unthread right underwear leg, Thread/unthread left underwear leg Underwear - Performed by helper: Pull underwear up/down Pants- Performed by patient: Thread/unthread right pants leg, Thread/unthread left pants leg, Pull pants up/down   Non-skid slipper socks- Performed by patient: Don/doff right sock                    Lower body assist Assist for lower  body dressing: More than reasonable time      Toileting Toileting   Toileting steps completed by patient: Adjust clothing prior to toileting, Performs perineal hygiene, Adjust clothing after toileting Toileting steps completed by helper: Adjust clothing prior to toileting, Performs perineal hygiene, Adjust clothing after toileting Toileting Assistive Devices: Grab bar or rail  Toileting assist Assist level: More than reasonable time   Transfers Chair/bed transfer   Chair/bed transfer method: Squat pivot Chair/bed transfer  assist level: No Help, no cues, assistive device, takes more than a reasonable amount of time Chair/bed transfer assistive device: Armrests     Locomotion Ambulation     Max distance: 65 ft Assist level: Supervision or verbal cues   Wheelchair   Type: Manual Max wheelchair distance: 150' Assist Level: No help, No cues, assistive device, takes more than reasonable amount of time  Cognition Comprehension Comprehension assist level: Follows complex conversation/direction with no assist  Expression Expression assist level: Expresses complex ideas: With extra time/assistive device  Social Interaction Social Interaction assist level: Interacts appropriately with others - No medications needed.  Problem Solving Problem solving assist level: Solves complex 90% of the time/cues < 10% of the time  Memory Memory assist level: More than reasonable amount of time    Medical Problem List and Plan:  1. Decreased functional mobility secondary to left BKA 08/23/2017    D/c today    Will see patient for transitional care management in 1-2 weeks 2. DVT Prophylaxis/Anticoagulation: SCDs right lower extremity  3. Pain Management/chronic pain: Oxycodone immediate release 15 mg every 4 hours as prior to admission, low dose oxycodone immediate release 5 mg every 4 hours as needed and Robaxin as needed. Scheduled gabapentin for phantom/neuropathic pain.    Controlled at present 4. Mood: Provide emotional support  5. Neuropsych: This patient is capable of making decisions on his own behalf.  6. Skin/Wound Care: Routine skin checks.   VAC removed 7. Fluids/Electrolytes/Nutrition: Routine I&O's  8. Hypertension. Coreg 25 mg twice a day, clonidine 0.1 mg twice a day, Norvasc 10 mg daily, Altace 20 mg daily    Slightly elevated, will need ambulatory adjustments 9. Diabetes mellitus peripheral neuropathy. Hemoglobin A1c 8.6. Lantus insulin 32 units daily, NovoLog 10 units 3 times a day with meals. Check blood  sugars before meals and at bedtime. Diabetic teaching    One elevation yesterday, otherwise relatively controlled 10. Renal and pancreas transplant recipient. Continue home immunosuppressive meds    With AKI, Cr 1.22 on 08/29/2017   Cont to monitor 11. Acute blood loss anemia.    Hb 11.0 on 1/21   H cont to monitor 12. Constipation. Laxative assistance  13. Hyperlipidemia. Lipitor  14. Hyponatremia: Resolved   Na 136 on 08/29/2017 15. Leukocytosis   WBCs 15.5 on 1/21, improving   Afebrile   No obvious signs of infection on examination.   16. Falls   Stump with increased drainage   Educated pt on monitoring of wound and signs/symptoms of infection/dehiscence   LOS (Days) 7 A FACE TO FACE EVALUATION WAS PERFORMED  Mylin Hirano Lorie Phenix 09/02/2017 8:32 AM

## 2017-09-02 NOTE — Progress Notes (Signed)
Orthopedic Tech Progress Note Patient Details:  Arthur Burke 01/23/1966 [id number]  Patient ID: [name], male   DOB: [birth year], 52 y.o.   MRN: [id number]   Marilu Favre [employer] for left BKA stump shrinker. 09/02/2017, 8:49 AM

## 2017-09-02 NOTE — Significant Event (Signed)
Witnessed fall occurred at 0230.  Patient and staff (Rutledge Yount's, CNA)  report patient was ambulating to the bathroom with walker. Patient lost grip of the walker and hand slipped, with no support on the left side patient slowly guided himself to floor.  Patient landed on the buttocks and hit his left knee/leg on the way down.  Assessment complete, no injuries sustained.   No changes in ROM.  Baseline orientation maintained  Provider on call, Reesa Chew, notified at [phone]. No new orders.  Emergency Contact, Madilyn Fireman, notified at [phone].  Questions answered and emotional support provided.

## 2017-09-02 NOTE — Progress Notes (Signed)
Pt discharged to home with family. Pt completed dressing change independently and pt given stump shrinkers for discharge.

## 2017-09-02 NOTE — Progress Notes (Signed)
Social Work  Discharge Note  The overall goal for the admission was met for:   Discharge location: Yes-HOME WITH MOM WHO CAN PROVIDE SUPERVISION LEVEL  Length of Stay: Yes-8 DAYS  Discharge activity level: Yes-MOD/I-SUPERVISION LEVEL  Home/community participation: Yes  Services provided included: MD, RD, PT, OT, RN, CM, TR, Pharmacy and SW  Financial Services: Medicare  Follow-up services arranged: Home Health: [facility], DME: [facility] and Patient/Family has no preference for HH/DME agencies  Comments (or additional information):PT DID WELL AND REACHED MOD/I-SUPERVISION LEVEL. MOM CAN PROVIDE THIS LEVEL OF CARE  Patient/Family verbalized understanding of follow-up arrangements: Yes  Individual responsible for coordination of the follow-up plan: SELF & ADA-MOM  Confirmed correct DME delivered: [name] 09/02/2017    [name]

## 2017-09-02 NOTE — Discharge Summary (Signed)
NAMEMURLE, OTTING                    ACCOUNT NO.:  [id number]  MEDICAL RECORD NO.:  [id number]  LOCATION:                                 FACILITY:  PHYSICIAN:  Delice Lesch, MD        DATE OF BIRTH:  1966-01-27  DATE OF ADMISSION:  08/26/2017 DATE OF DISCHARGE:  09/02/2017                              DISCHARGE SUMMARY   DISCHARGE DIAGNOSES: 1. Left below-knee amputation on August 23, 2017. 2. Sequential compression devices for right lower extremity deep vein     thrombosis prophylaxis. 3. Chronic pain management. 4. Hypertension. 5. Diabetes mellitus with peripheral neuropathy. 6. Renal and pancreas transplant recipient. 7. Acute blood loss anemia. 8. Constipation. 9. Hyperlipidemia. 10.Leukocytosis, resolving.  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  He lives with his mother, independent with a walker prior to admission. Presented on August 21, 2017, with gangrenous changes of the left foot, low-grade fever, white blood cell count 18,000.  MRI showed recurrent osteomyelitis extensive necrosis.  Blood cultures 1 of 2 grew strep species with Infectious Disease consulted, placed on cefepime, changed to vancomycin, later discontinued on August 25, 2017.  Limb was not felt to be salvageable and underwent transtibial amputation on August 23, 2017, per Dr. Sharol Given.  Hospital course, pain management.  Home oxycodone, resumed.  Hyponatremia, 129-132.  Remained on contact precautions for MRSA.  Acute blood loss anemia, 10.4 and monitored. Physical and occupational therapy ongoing.  The patient was admitted for comprehensive rehab program.  PAST MEDICAL HISTORY:  See discharge diagnoses.  SOCIAL HISTORY:  Lives with his mother.  Used a walker prior  to admission.  FUNCTIONAL STATUS UPON ADMISSION TO REHAB SERVICES:  Minimal guard 4 feet rolling walker, minimal assist stand pivot transfers, modified independent overall bed mobility, min to mod assist activities of daily living.  PHYSICAL EXAMINATION:  VITAL SIGNS:  Blood pressure 165/58, pulse 57, temperature 97, and respirations 18. GENERAL:  This was an alert male, in no acute distress. HEENT:  EOMs intact. NECK:  Supple, nontender.  No JVD. CARDIAC:  Rate controlled. ABDOMEN:  Soft, nontender.  Good bowel sounds. LUNGS:  Clear to auscultation without wheeze. EXTREMITIES:  Left BKA site was dressed with a wound VAC in place.  REHABILITATION HOSPITAL COURSE:  The patient was admitted to Inpatient Rehab Services with therapies initiated on a 3-hour daily basis, consisting of physical therapy, occupational therapy, and rehabilitation nursing.  The following issues were addressed during the patient's rehabilitation stay.  Pertaining to Mr. Papandrea's left BKA, surgical site healing nicely.  He would follow up with Orthopedic Services, Dr. Sharol Given. SCDs in place for right lower extremity DVT prophylaxis.  Chronic pain management with the use of oxycodone 15 mg every 4 hours as well as Robaxin as needed for breakthrough pain.  Blood pressures remained well controlled, on Altace, clonidine, Coreg, and Norvasc.  Diabetes mellitus, peripheral neuropathy, hemoglobin A1c of 8.6, he was on Lantus insulin 32 units  at bedtime, NovoLog 10 units t.i.d., full diabetic teaching.  The patient had undergone renal and pancreas transplants in the past.  He continued with immunosuppressive medications, latest creatinine 1.22.  Acute blood loss anemia, stable at 11.0, no bleeding episodes.  Bouts of constipation, resolved with laxative assistance.  He remained on Lipitor for hyperlipidemia.  The patient received weekly collaborative interdisciplinary team conferences to discuss estimated length of stay,  family teaching, any barriers to discharge.  Sessions focused on functional transfers, wheelchair mobility, ambulation in a home-like environment.  Transferred wheelchair, squat pivot, supervision, self propelled wheelchair, modified independent.  The patient able to verbalize and manage wheelchair parts.  Practice transfers to and from recliner and to and from bed with supervision.  He could gather his belongings for activities of daily living and homemaking, dressing, grooming, and hygiene.  Full family teaching was completed and plan discharge to home.  DISCHARGE MEDICATIONS: 1. Norvasc 10 mg p.o. daily. 2. Lipitor 40 mg p.o. daily. 3. Coreg 25 mg p.o. b.i.d. 4. Clonidine 0.1 mg p.o. b.i.d. 5. Insulin, NovoLog 10 units t.i.d. 6. Lantus insulin 32 units at bedtime. 7. CellCept 500 mg p.o. b.i.d. 8. Oxycodone 500 mg p.o. every 4 hours as prior to admission. 9. Protonix 40 mg p.o. daily. 10.Prednisone 5 mg p.o. daily. 11.Rozerem 8 mg p.o. at bedtime. 12.Altace 20 mg p.o. daily. 13.Senokot-S 1 tablet p.o. b.i.d. 14.Prograf 1 mg p.o. b.i.d. 15.Vitamin B12 500 mcg p.o. daily. 16.Robaxin 500 mg p.o. every 6 hours as needed pain. 17.Oxycodone immediate release 5 mg every 4 hours as needed     breakthrough pain.  DIET:  His diet was a diabetic diet.  FOLLOWUP:  The patient would follow up with Dr. Delice Lesch at the Outpatient Rehab Service office as directed, Dr. Meridee Score, Orthopedic Services, call for appointment in 1-2 weeks; Care Management arranging for PCP.     Lauraine Rinne, P.A.   ______________________________ Delice Lesch, MD    DA/MEDQ  D:  09/01/2017  T:  09/01/2017  Job:  [id number]  cc:   Newt Minion, MD Delice Lesch, MD

## 2017-09-03 ENCOUNTER — Telehealth: Payer: Self-pay

## 2017-09-03 NOTE — Telephone Encounter (Addendum)
Transitional Care call  Patient name: Arthur Burke, Arthur Burke) DOB: (10/04/2065) 1. Are you/is patient experiencing any problems since coming home? (NO) a. Are there any questions regarding any aspect of care? (NO) 2. Are there any questions regarding medications administration/dosing? (NO) a. Are meds being taken as prescribed? (YES) b. "Patient should review meds with caller to confirm"  3. Have there been any falls? (NO) 4. Has Home Health been to the house and/or have they contacted you? (NO) a. If not, have you tried to contact them? (NO) b. Can we help you contact them? (NO) 5. Are bowels and bladder emptying properly? (YES) a. Are there any unexpected incontinence issues? (NO) b. If applicable, is patient following bowel/bladder programs? (NA) 6. Any fevers, problems with breathing, unexpected pain? (NO) 7. Are there any skin problems or new areas of breakdown? (NO) 8. Has the patient/family member arranged specialty MD follow up (ie cardiology/neurology/renal/surgical/etc.)?  (YES) a. Can we help arrange? (NO) 9. Does the patient need any other services or support that we can help arrange? (NO) 10. Are caregivers following through as expected in assisting the patient? (YES) 11. Has the patient quit smoking, drinking alcohol, or using drugs as recommended? (NA)  Appointment date/time (09-11-2017 / 1120am), arrive time (1100am) and who it is with here (Dr. Posey Pronto) [place]

## 2017-09-08 ENCOUNTER — Encounter (INDEPENDENT_AMBULATORY_CARE_PROVIDER_SITE_OTHER): Payer: Self-pay | Admitting: Orthopedic Surgery

## 2017-09-08 ENCOUNTER — Ambulatory Visit (INDEPENDENT_AMBULATORY_CARE_PROVIDER_SITE_OTHER): Payer: Medicare Other | Admitting: Orthopedic Surgery

## 2017-09-08 VITALS — Ht 71.0 in | Wt 175.0 lb

## 2017-09-08 DIAGNOSIS — S88112S Complete traumatic amputation at level between knee and ankle, left lower leg, sequela: Secondary | ICD-10-CM

## 2017-09-08 DIAGNOSIS — M86172 Other acute osteomyelitis, left ankle and foot: Secondary | ICD-10-CM

## 2017-09-08 DIAGNOSIS — Z89512 Acquired absence of left leg below knee: Secondary | ICD-10-CM

## 2017-09-08 NOTE — Progress Notes (Signed)
Office Visit Note   Patient: Arthur Burke           Date of Birth: 09-17-1965           MRN: [id number] Visit Date: 09/08/2017              Requested by: No referring provider defined for this encounter. PCP: Patient, No Pcp Per  Chief Complaint  Patient presents with  . Left Leg - Routine Post Op    08/23/17 left below the knee amputation       HPI: Patient is a 52 year old gentleman status post left transtibial amputation 2 weeks out.  Patient denies any pain or complications.  Assessment & Plan: Visit Diagnoses:  1. Unilateral complete BKA, left, sequela ([place])     Plan: Will harvest the staples today patient is in a 4 extra-large stump shrinker he will need a shrinker much smaller he will follow-up with biotech a prescription was provided for a new stump shrinker as well as a K3 level prosthesis.  Patient has the need desire and functional ability to ambulate on uneven terrain ambulate up and down bleachers without problems.  Follow-Up Instructions: Return in about 3 weeks (around 09/29/2017).   Ortho Exam  Patient is alert, oriented, no adenopathy, well-dressed, normal affect, normal respiratory effort. Examination the leg has minimal swelling there is some redness around the staples but no cellulitis no drainage no wound dehiscence.  There is no signs of infection he has full range of motion of his knee.  Imaging: No results found. No images are attached to the encounter.  Labs: Lab Results  Component Value Date   HGBA1C 8.6 (H) 08/21/2017   REPTSTATUS 08/28/2017 FINAL 08/23/2017   GRAMSTAIN  08/21/2017    MODERATE WBC PRESENT, PREDOMINANTLY PMN ABUNDANT GRAM NEGATIVE RODS ABUNDANT GRAM POSITIVE COCCI RARE GRAM POSITIVE RODS    CULT NO GROWTH 5 DAYS 08/23/2017   LABORGA VIRIDANS STREPTOCOCCUS 08/21/2017    @LABSALLVALUES (HGBA1)@  Body mass index is 24.41 kg/m.  Orders:  No orders of the defined types were placed in this encounter.  No orders of the  defined types were placed in this encounter.    Procedures: No procedures performed  Clinical Data: No additional findings.  ROS:  All other systems negative, except as noted in the HPI. Review of Systems  Objective: Vital Signs: Ht 5\' 11"  (1.803 m)   Wt 175 lb (79.4 kg)   BMI 24.41 kg/m   Specialty Comments:  No specialty comments available.  PMFS History: Patient Active Problem List   Diagnosis Date Noted  . Fall   . Essential hypertension   . Unilateral complete BKA, left, sequela ([place])   . Leukocytosis   . AKI (acute kidney injury) ([place])   . Diabetic peripheral neuropathy associated with type 2 diabetes mellitus ([place])   . Amputation of left lower extremity below knee ([place]) 08/26/2017  . Anemia in chronic kidney disease   . Unilateral complete BKA, left, subsequent encounter ([place])   . Post-operative pain   . Stage 3 chronic kidney disease ([place])   . Hyponatremia   . Benign essential HTN   . Acute blood loss anemia   . S/P BKA (below knee amputation) unilateral, left ([place]) 08/23/2017  . Streptococcal bacteremia 08/23/2017  . Acute osteomyelitis of left calcaneus ([facility])   . Lower limb ulcer, ankle, left, with necrosis of muscle ([place])   . Normocytic anemia   . Infected ulcer of skin, with fat layer exposed ([place])   .  Renal transplant recipient   . 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 ([place])    Past Medical History:  Diagnosis Date  . Diabetes mellitus without complication ([place])   . Diabetic foot infection ([place])   . Hypertension   . MRSA (methicillin resistant staph aureus) culture positive   . Renal disorder   . Vision impairment     History reviewed. No pertinent family history.  Past Surgical History:  Procedure Laterality Date  . AMPUTATION Left 08/23/2017   Procedure: AMPUTATION BELOW KNEE;  Surgeon: Newt Minion, MD;  Location: [facility];  Service:  Orthopedics;  Laterality: Left;  . APPLICATION OF WOUND VAC Left 08/23/2017   Procedure: APPLICATION OF WOUND VAC;  Surgeon: Newt Minion, MD;  Location: [facility];  Service: Orthopedics;  Laterality: Left;  . KIDNEY TRANSPLANT  2006   [facility]  . SKIN GRAFT     L diabetic foot wound with debriedment and skin grafting  . TOE AMPUTATION     R great and 2nd toe   Social History   Occupational History  . Not on file  Tobacco Use  . Smoking status: Never Smoker  . Smokeless tobacco: Never Used  Substance and Sexual Activity  . Alcohol use: No    Frequency: Never  . Drug use: No  . Sexual activity: Not on file

## 2017-09-11 ENCOUNTER — Encounter: Payer: Self-pay | Admitting: Physical Medicine & Rehabilitation

## 2017-09-11 ENCOUNTER — Encounter: Payer: Medicare Other | Attending: Physical Medicine & Rehabilitation | Admitting: Physical Medicine & Rehabilitation

## 2017-09-11 VITALS — BP 153/79 | HR 68 | Resp 14

## 2017-09-11 DIAGNOSIS — G894 Chronic pain syndrome: Secondary | ICD-10-CM

## 2017-09-11 DIAGNOSIS — I10 Essential (primary) hypertension: Secondary | ICD-10-CM | POA: Diagnosis not present

## 2017-09-11 DIAGNOSIS — E1142 Type 2 diabetes mellitus with diabetic polyneuropathy: Secondary | ICD-10-CM | POA: Diagnosis not present

## 2017-09-11 DIAGNOSIS — R269 Unspecified abnormalities of gait and mobility: Secondary | ICD-10-CM

## 2017-09-11 DIAGNOSIS — Z89512 Acquired absence of left leg below knee: Secondary | ICD-10-CM

## 2017-09-11 DIAGNOSIS — Z940 Kidney transplant status: Secondary | ICD-10-CM | POA: Diagnosis not present

## 2017-09-11 DIAGNOSIS — S88112S Complete traumatic amputation at level between knee and ankle, left lower leg, sequela: Secondary | ICD-10-CM

## 2017-09-11 NOTE — Progress Notes (Signed)
Subjective:    Patient ID: Arthur Burke, male    DOB: February 23, 1966, 52 y.o.   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   DATE OF ADMISSION:  08/26/2017 DATE OF DISCHARGE:  09/02/2017  At discharge, he was instructed to follow up with Dr. Sharol Given, which he did. He still does have a PCP, but says his Nephrologist at [facility]. He continues to follow up with chronic pain clinic. His BP is slightly elevated. His CBGs have been averaging 125.  He continues to follow up with physicians as [facility] transplant.  Denies falls.    DME: Shower chair, bedside commode Mobility: Wheelchair most of the time, walker for transfers Therapies: States he has been trying to coordinate with therapies  Pain Inventory Average Pain 5 Pain Right Now 6 My pain is dull and tingling  In the last 24 hours, has pain interfered with the following? General activity 3 Relation with others 0 Enjoyment of life 4 What TIME of day is your pain at its worst? morning Sleep (in general) Poor  Pain is worse with: unsure Pain improves with: therapy/exercise and medication Relief from Meds: 7  Mobility walk with assistance how many minutes can you walk? 15 ability to climb steps?  no do you drive?  no use a wheelchair transfers alone Do you have any goals in this area?  yes  Function disabled: date disabled .  Neuro/Psych numbness tingling trouble walking spasms  Prior Studies hospital f/u  Physicians involved in your care hospital f/u   History reviewed. No pertinent family history. Social History   Socioeconomic History  . Marital status: Divorced   Spouse name: None  . Number of children: None  . Years of education: None  . Highest education level: None  Social Needs  . Financial resource strain: None  . Food insecurity - worry: None  . Food insecurity - inability: None  . Transportation needs - medical: None  . Transportation needs - non-medical: None  Occupational History  . None  Tobacco Use  . Smoking status: Never Smoker  . Smokeless tobacco: Never Used  Substance and Sexual Activity  . Alcohol use: No    Frequency: Never  . Drug use: No  . Sexual activity: None  Other Topics Concern  . None  Social History Narrative   Pt plans to be discharge home with mother    Past Surgical History:  Procedure Laterality Date  . AMPUTATION Left 08/23/2017   Procedure: AMPUTATION BELOW KNEE;  Surgeon: Newt Minion, MD;  Location: [place];  Service: Orthopedics;  Laterality: Left;  . APPLICATION OF WOUND VAC Left 08/23/2017   Procedure: APPLICATION OF WOUND VAC;  Surgeon: Newt Minion, MD;  Location: [place];  Service: Orthopedics;  Laterality: Left;  . KIDNEY TRANSPLANT  2006   [facility]  . SKIN GRAFT     L diabetic foot wound with debriedment and skin grafting  . TOE AMPUTATION     R great and 2nd toe   Past Medical History:  Diagnosis Date  . Diabetes mellitus without complication ([place])   . Diabetic foot infection ([place])   . Hypertension   . MRSA (methicillin resistant staph aureus) culture positive   .  Renal disorder   . Vision impairment    BP (!) 153/79 (BP Location: Right Arm, Patient Position: Sitting, Cuff Size: Normal)   Pulse 68   Resp 14   SpO2 97%   Opioid Risk Score:   Fall Risk Score:  `1  Depression screen PHQ 2/9  Depression screen PHQ 2/9 08/22/2017  Decreased Interest 0  Down, Depressed, Hopeless 0  PHQ - 2 Score 0    Review of Systems  Constitutional: Negative.   HENT: Negative.   Eyes: Negative.   Respiratory: Negative.   Cardiovascular: Positive for leg swelling.  Gastrointestinal:  Negative.   Endocrine:       Diabetes  Musculoskeletal: Positive for gait problem.       Spasms  Skin: Negative.   Allergic/Immunologic: Negative.   Neurological: Positive for numbness.       Tingling  Hematological: Negative.   Psychiatric/Behavioral: Negative.        Objective:   Physical Exam Constitutional: He appears well-developed. NAD. HENT: Normocephalic. Atraumatic.  Eyes: EOM are normal. No discharge.  Cardiovascular: RRR. No JVD  Respiratory: CTA Bilaterally. Normal effort   GI: Bowel sounds are normal. He exhibits no distension.  Neurological: He is alert and oriented.  Motor: B/l UE 5/5 proximal to distal RLE: HF: 5/5 prox to distal LLE: HF 5/5 HF, KE 5/5  Skin.  Left BKA incision with small areas still healing Psych: Pleasant and appropriate. Normal mood and behavior.     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  1. Decreased functional mobility secondary to left BKA 08/23/2017   Encouraged follow up with therapies  Follow up with Ortho  Encouraged follow up with Prosthetist for smaller shrinker  2. Pain Management/chronic pain:   Cont to follow up with pain management  3. Hypertension.   Cont meds  Slightly elevated today  4. Diabetes mellitus peripheral neuropathy.   Controlled at present  Cont meds  5. Renal and pancreas transplant recipient.   Cont follow up with [facility]  6. Gait abnormality  Cont therapies  Cont wheelchair for safety  Meds reviewed Referrals reviewed All questions answered

## 2017-09-29 ENCOUNTER — Ambulatory Visit (INDEPENDENT_AMBULATORY_CARE_PROVIDER_SITE_OTHER): Payer: Medicare Other | Admitting: Orthopedic Surgery

## 2017-09-29 ENCOUNTER — Encounter (INDEPENDENT_AMBULATORY_CARE_PROVIDER_SITE_OTHER): Payer: Self-pay | Admitting: Orthopedic Surgery

## 2017-09-29 VITALS — Ht 71.0 in | Wt 175.0 lb

## 2017-09-29 DIAGNOSIS — S88112A Complete traumatic amputation at level between knee and ankle, left lower leg, initial encounter: Secondary | ICD-10-CM

## 2017-09-29 DIAGNOSIS — Z89512 Acquired absence of left leg below knee: Secondary | ICD-10-CM

## 2017-09-29 NOTE — Progress Notes (Signed)
Office Visit Note   Patient: Arthur Burke           Date of Birth: Feb 26, 1966           MRN: [id number] Visit Date: 09/29/2017              Requested by: No referring provider defined for this encounter. PCP: Patient, No Pcp Per  Chief Complaint  Patient presents with  . Left Leg - Routine Post Op    08/23/17 left BKA      HPI: Patient is a 52 year old gentleman status post left transtibial amputation.  Patient denies any pain or complications. Is in shrinker without dressing underlying.  Assessment & Plan: Visit Diagnoses:  1. Amputation of left lower extremity below knee ([place])     Plan: He will follow-up with biotech a prescription was provided for a new stump shrinker as well as a K3 level prosthesis.  Patient has the need desire and functional ability to ambulate on uneven terrain ambulate up and down bleachers without problems. Follow up in office in 3 months.  Follow-Up Instructions: Return in about 3 months (around 12/27/2017).   Ortho Exam  Patient is alert, oriented, no adenopathy, well-dressed, normal affect, normal respiratory effort. Examination the leg has minimal swelling. Incision is healing well. Debrided of eschar. One remaining staple harvested. there is some redness around the staples but no cellulitis no drainage no wound dehiscence.  There is no signs of infection he has full range of motion of his knee.  Imaging: No results found. No images are attached to the encounter.  Labs: Lab Results  Component Value Date   HGBA1C 8.6 (H) 08/21/2017   REPTSTATUS 08/28/2017 FINAL 08/23/2017   GRAMSTAIN  08/21/2017    MODERATE WBC PRESENT, PREDOMINANTLY PMN ABUNDANT GRAM NEGATIVE RODS ABUNDANT GRAM POSITIVE COCCI RARE GRAM POSITIVE RODS    CULT NO GROWTH 5 DAYS 08/23/2017   LABORGA VIRIDANS STREPTOCOCCUS 08/21/2017    @LABSALLVALUES (HGBA1)@  Body mass index is 24.41 kg/m.  Orders:  No orders of the defined types were placed in this encounter.  No  orders of the defined types were placed in this encounter.    Procedures: No procedures performed  Clinical Data: No additional findings.  ROS:  All other systems negative, except as noted in the HPI. Review of Systems  Constitutional: Negative for chills and fever.    Objective: Vital Signs: Ht 5\' 11"  (1.803 m)   Wt 175 lb (79.4 kg)   BMI 24.41 kg/m   Specialty Comments:  No specialty comments available.  PMFS History: Patient Active Problem List   Diagnosis Date Noted  . Fall   . Essential hypertension   . Unilateral complete BKA, left, sequela ([place])   . Leukocytosis   . AKI (acute kidney injury) ([place])   . Diabetic peripheral neuropathy associated with type 2 diabetes mellitus ([place])   . Amputation of left lower extremity below knee ([place]) 08/26/2017  . Anemia in chronic kidney disease   . Unilateral complete BKA, left, subsequent encounter ([place])   . Post-operative pain   . Stage 3 chronic kidney disease ([place])   . Hyponatremia   . Benign essential HTN   . Acute blood loss anemia   . S/P BKA (below knee amputation) unilateral, left ([place]) 08/23/2017  . Streptococcal bacteremia 08/23/2017  . Normocytic anemia   . Renal transplant recipient   . 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 Diabetic neuropathy associated with  type 1 diabetes mellitus ([place])    Past Medical History:  Diagnosis Date  . Acute osteomyelitis of left calcaneus ([facility])   . Diabetes mellitus without complication ([place])   . Diabetic foot infection ([place])   . Hypertension   . Infected ulcer of skin, with fat layer exposed ([place])   . Lower limb ulcer, ankle, left, with necrosis of muscle ([place])   . MRSA (methicillin resistant staph aureus) culture positive   . Renal disorder   . Vision impairment     History reviewed. No pertinent family history.  Past Surgical History:  Procedure Laterality Date  . AMPUTATION Left 08/23/2017   Procedure: AMPUTATION BELOW KNEE;   Surgeon: Newt Minion, MD;  Location: [place];  Service: Orthopedics;  Laterality: Left;  . APPLICATION OF WOUND VAC Left 08/23/2017   Procedure: APPLICATION OF WOUND VAC;  Surgeon: Newt Minion, MD;  Location: [place];  Service: Orthopedics;  Laterality: Left;  . KIDNEY TRANSPLANT  2006   [facility]  . SKIN GRAFT     L diabetic foot wound with debriedment and skin grafting  . TOE AMPUTATION     R great and 2nd toe   Social History   Occupational History  . Not on file  Tobacco Use  . Smoking status: Never Smoker  . Smokeless tobacco: Never Used  Substance and Sexual Activity  . Alcohol use: No    Frequency: Never  . Drug use: No  . Sexual activity: Not on file

## 2017-11-07 ENCOUNTER — Encounter: Payer: Self-pay | Admitting: Physical Medicine & Rehabilitation

## 2017-11-07 ENCOUNTER — Encounter: Payer: Medicare Other | Attending: Physical Medicine & Rehabilitation | Admitting: Physical Medicine & Rehabilitation

## 2017-11-07 VITALS — BP 135/79 | HR 79 | Resp 79

## 2017-11-07 DIAGNOSIS — I10 Essential (primary) hypertension: Secondary | ICD-10-CM | POA: Diagnosis not present

## 2017-11-07 DIAGNOSIS — Z89512 Acquired absence of left leg below knee: Secondary | ICD-10-CM

## 2017-11-07 DIAGNOSIS — G894 Chronic pain syndrome: Secondary | ICD-10-CM | POA: Diagnosis not present

## 2017-11-07 DIAGNOSIS — R269 Unspecified abnormalities of gait and mobility: Secondary | ICD-10-CM | POA: Diagnosis not present

## 2017-11-07 DIAGNOSIS — E1051 Type 1 diabetes mellitus with diabetic peripheral angiopathy without gangrene: Secondary | ICD-10-CM | POA: Diagnosis not present

## 2017-11-07 DIAGNOSIS — Z940 Kidney transplant status: Secondary | ICD-10-CM

## 2017-11-07 DIAGNOSIS — S88112S Complete traumatic amputation at level between knee and ankle, left lower leg, sequela: Secondary | ICD-10-CM

## 2017-11-07 DIAGNOSIS — E1142 Type 2 diabetes mellitus with diabetic polyneuropathy: Secondary | ICD-10-CM

## 2017-11-07 DIAGNOSIS — I70209 Unspecified atherosclerosis of native arteries of extremities, unspecified extremity: Secondary | ICD-10-CM

## 2017-11-07 MED ORDER — METHOCARBAMOL 500 MG PO TABS: 500.0000 mg | ORAL_TABLET | Freq: Two times a day (BID) | ORAL | 1 refills | Status: DC | PRN

## 2017-11-07 NOTE — Progress Notes (Signed)
Subjective:    Patient ID: Arthur Burke, male    DOB: 03-21-1966, 52 y.o.   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  Since that time, pt states he never follow up with therapies because he felt he was able to do what was needed.  He saw Ortho.  He followed up with prosthetist. CBGs ~130-190.  Denies falls.  Pain Inventory Average Pain 5 Pain Right Now 4 My pain is sharp, stabbing, tingling and aching  In the last 24 hours, has pain interfered with the following? General activity 5 Relation with others 0 Enjoyment of life 3 What TIME of day is your pain at its worst? night Sleep (in general) Fair  Pain is worse with: unsure Pain improves with: medication Relief from Meds: 7  Mobility walk with assistance use a walker ability to climb steps?  no do you drive?  no use a wheelchair  Function disabled: date disabled .  Neuro/Psych numbness tingling trouble walking  Prior Studies Any changes since last visit?  no  Physicians involved in your care Any changes since last visit?  no   History reviewed. No pertinent family history. Social History   Socioeconomic History  . Marital status: Divorced    Spouse name: Not on file  . Number of children: Not on file  . Years of education: Not on file  . Highest education level: Not on file  Occupational History  . Not on file  Social Needs  . Financial resource strain: Not on file  . Food insecurity:    Worry: Not on file    Inability: Not on file  . Transportation needs:    Medical: Not on file    Non-medical: Not on file  Tobacco Use  . Smoking status: Never Smoker  . Smokeless tobacco: Never Used    Substance and Sexual Activity  . Alcohol use: No    Frequency: Never  . Drug use: No  . Sexual activity: Not on file  Lifestyle  . Physical activity:    Days per week: Not on file    Minutes per session: Not on file  . Stress: Not on file  Relationships  . Social connections:    Talks on phone: Not on file    Gets together: Not on file    Attends religious service: Not on file    Active member of club or organization: Not on file    Attends meetings of clubs or organizations: Not on file    Relationship status: Not on file  Other Topics Concern  . Not on file  Social History Narrative   Pt plans to be discharge home with mother    Past Surgical History:  Procedure Laterality Date  . AMPUTATION Left 08/23/2017   Procedure: AMPUTATION BELOW KNEE;  Surgeon: Newt Minion, MD;  Location: [place];  Service: Orthopedics;  Laterality: Left;  . APPLICATION OF WOUND VAC Left 08/23/2017   Procedure: APPLICATION OF WOUND VAC;  Surgeon: Newt Minion, MD;  Location: [place];  Service: Orthopedics;  Laterality: Left;  . KIDNEY TRANSPLANT  2006   [facility]  . SKIN GRAFT     L diabetic foot wound with debriedment and skin grafting  .  TOE AMPUTATION     R great and 2nd toe   Past Medical History:  Diagnosis Date  . Acute osteomyelitis of left calcaneus (H[facility])   . Diabetes mellitus without complication ([place])   . Diabetic foot infection ([place])   . Hypertension   . Infected ulcer of skin, with fat layer exposed ([place])   . Lower limb ulcer, ankle, left, with necrosis of muscle ([place])   . MRSA (methicillin resistant staph aureus) culture positive   . Renal disorder   . Vision impairment    BP 135/79 (BP Location: Left Arm, Patient Position: Sitting, Cuff Size: Normal)   Pulse 79   Resp (!) 79   SpO2 95%   Opioid Risk Score:   Fall Risk Score:  `1  Depression screen PHQ 2/9  Depression screen PHQ 2/9 08/22/2017  Decreased Interest 0  Down, Depressed, Hopeless 0  PHQ - 2 Score 0     Review of Systems  Constitutional: Negative.   HENT: Negative.   Eyes: Negative.   Respiratory: Negative.   Cardiovascular: Positive for leg swelling.  Gastrointestinal: Positive for abdominal pain.  Endocrine:       Diabetes  high blood sugar  Musculoskeletal: Positive for gait problem.       Spasms  Skin: Negative.   Allergic/Immunologic: Negative.   Neurological: Positive for numbness.       Tingling  Hematological: Negative.   Psychiatric/Behavioral: Negative.   All other systems reviewed and are negative.      Objective:   Physical Exam Constitutional: He appears well-developed. NAD. HENT: Normocephalic. Atraumatic.  Eyes: EOM are normal. No discharge.  Cardiovascular: RRR. No JVD  Respiratory: CTA Bilaterally. Normal effort   GI: Bowel sounds are normal. He exhibits no distension.  Neurological: He is alert and oriented.  Motor: B/l UE 5/5 proximal to distal RLE: HF: 5/5 prox to distal LLE: HF 5/5 HF, KE 5/5  Skin.  Left BKA incision healing well Psych: Pleasant and appropriate. Normal mood and behavior.     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  1. Decreased functional mobility secondary to left BKA 08/23/2017   Cont HEP  Cont follow up with Ortho  Cont follow up with prosthetist   2. Pain Management/chronic pain:   Cont to follow up with pain management  Robaxin 500 BID PRN ordered  3. Renal and pancreas transplant recipient.   Cont follow up with [facility]  4. Gait abnormality  Cont therapies  Cont wheelchair for safety

## 2017-12-29 ENCOUNTER — Ambulatory Visit (INDEPENDENT_AMBULATORY_CARE_PROVIDER_SITE_OTHER): Payer: Medicare Other | Admitting: Orthopedic Surgery

## 2018-02-06 ENCOUNTER — Encounter: Payer: Medicare Other | Admitting: Physical Medicine & Rehabilitation

## 2018-02-06 ENCOUNTER — Ambulatory Visit: Payer: Medicare Other | Admitting: Physical Medicine & Rehabilitation

## 2018-02-11 ENCOUNTER — Telehealth (INDEPENDENT_AMBULATORY_CARE_PROVIDER_SITE_OTHER): Payer: Self-pay | Admitting: Orthopedic Surgery

## 2018-02-11 NOTE — Telephone Encounter (Signed)
Patient called requesting a referral to received in home PT due to the fact that he has a hard time finding a ride and it would be easier for them to come to him.  CB#[phone].  Thank you.

## 2018-02-13 NOTE — Telephone Encounter (Signed)
I left voicemail for patient that we would send referral to Arthur.

## 2018-02-13 NOTE — Telephone Encounter (Signed)
[place] for [place]

## 2018-02-13 NOTE — Telephone Encounter (Signed)
Please advise 

## 2018-02-16 NOTE — Telephone Encounter (Signed)
See below

## 2018-02-17 ENCOUNTER — Other Ambulatory Visit (INDEPENDENT_AMBULATORY_CARE_PROVIDER_SITE_OTHER): Payer: Self-pay

## 2018-02-18 ENCOUNTER — Other Ambulatory Visit (INDEPENDENT_AMBULATORY_CARE_PROVIDER_SITE_OTHER): Payer: Self-pay

## 2018-02-18 NOTE — Telephone Encounter (Signed)
[facility]ed to [facility] to eval [facility] treat

## 2018-02-20 ENCOUNTER — Telehealth (INDEPENDENT_AMBULATORY_CARE_PROVIDER_SITE_OTHER): Payer: Self-pay

## 2018-02-20 NOTE — Telephone Encounter (Signed)
Noted  

## 2018-02-20 NOTE — Telephone Encounter (Signed)
Dondra Spry with [facility] wanted to let Dr. Sharol Given know that they will be going out to see patient on Tuesday, 02/24/2018 to start HHPT for patient.

## 2018-02-24 ENCOUNTER — Telehealth (INDEPENDENT_AMBULATORY_CARE_PROVIDER_SITE_OTHER): Payer: Self-pay

## 2018-02-24 NOTE — Telephone Encounter (Signed)
For medicare to approve HHPT for this pt he needs follow up with Dr. Sharol Given that has been with in the past 90 days. I will call pt to advise.

## 2018-02-25 NOTE — Telephone Encounter (Signed)
Called and lm on vm for the pt to advise that his insurance requires that he must be seen within 90 days of making the referral to attest to the home bound status and care needed. To call the office and make an appt to see dr. Sharol Given last [place] was 09/2017. Once he has had the appt we can fax the office notes to [facility] and they can begin seeing the pt. To call with any questions.

## 2021-10-21 ENCOUNTER — Emergency Department (HOSPITAL_COMMUNITY): Payer: Medicare Other

## 2021-10-21 ENCOUNTER — Other Ambulatory Visit: Payer: Self-pay

## 2021-10-21 ENCOUNTER — Inpatient Hospital Stay (HOSPITAL_COMMUNITY)
Admission: EM | Admit: 2021-10-21 | Discharge: 2021-10-30 | DRG: 674 | Disposition: A | Discharge status: Home Health | Payer: Medicare Other | Attending: Internal Medicine | Admitting: Internal Medicine

## 2021-10-21 ENCOUNTER — Encounter (HOSPITAL_COMMUNITY): Payer: Self-pay

## 2021-10-21 DIAGNOSIS — Z888 Allergy status to other drugs, medicaments and biological substances status: Secondary | ICD-10-CM

## 2021-10-21 DIAGNOSIS — Z89411 Acquired absence of right great toe: Secondary | ICD-10-CM

## 2021-10-21 DIAGNOSIS — H547 Unspecified visual loss: Secondary | ICD-10-CM | POA: Diagnosis present

## 2021-10-21 DIAGNOSIS — J392 Other diseases of pharynx: Secondary | ICD-10-CM | POA: Diagnosis not present

## 2021-10-21 DIAGNOSIS — E1042 Type 1 diabetes mellitus with diabetic polyneuropathy: Secondary | ICD-10-CM | POA: Diagnosis present

## 2021-10-21 DIAGNOSIS — Z79621 Long term (current) use of calcineurin inhibitor: Secondary | ICD-10-CM

## 2021-10-21 DIAGNOSIS — T8619 Other complication of kidney transplant: Principal | ICD-10-CM | POA: Diagnosis present

## 2021-10-21 DIAGNOSIS — Z89421 Acquired absence of other right toe(s): Secondary | ICD-10-CM

## 2021-10-21 DIAGNOSIS — D649 Anemia, unspecified: Secondary | ICD-10-CM | POA: Diagnosis present

## 2021-10-21 DIAGNOSIS — E8809 Other disorders of plasma-protein metabolism, not elsewhere classified: Secondary | ICD-10-CM | POA: Diagnosis present

## 2021-10-21 DIAGNOSIS — R590 Localized enlarged lymph nodes: Secondary | ICD-10-CM

## 2021-10-21 DIAGNOSIS — Z79899 Other long term (current) drug therapy: Secondary | ICD-10-CM | POA: Diagnosis not present

## 2021-10-21 DIAGNOSIS — N184 Chronic kidney disease, stage 4 (severe): Secondary | ICD-10-CM | POA: Diagnosis present

## 2021-10-21 DIAGNOSIS — N179 Acute kidney failure, unspecified: Secondary | ICD-10-CM | POA: Diagnosis present

## 2021-10-21 DIAGNOSIS — Z931 Gastrostomy status: Secondary | ICD-10-CM | POA: Diagnosis not present

## 2021-10-21 DIAGNOSIS — E1065 Type 1 diabetes mellitus with hyperglycemia: Secondary | ICD-10-CM

## 2021-10-21 DIAGNOSIS — E1022 Type 1 diabetes mellitus with diabetic chronic kidney disease: Secondary | ICD-10-CM | POA: Diagnosis present

## 2021-10-21 DIAGNOSIS — E861 Hypovolemia: Secondary | ICD-10-CM | POA: Diagnosis present

## 2021-10-21 DIAGNOSIS — Z794 Long term (current) use of insulin: Secondary | ICD-10-CM

## 2021-10-21 DIAGNOSIS — Z87891 Personal history of nicotine dependence: Secondary | ICD-10-CM

## 2021-10-21 DIAGNOSIS — E10649 Type 1 diabetes mellitus with hypoglycemia without coma: Secondary | ICD-10-CM | POA: Diagnosis not present

## 2021-10-21 DIAGNOSIS — Z7952 Long term (current) use of systemic steroids: Secondary | ICD-10-CM

## 2021-10-21 DIAGNOSIS — I10 Essential (primary) hypertension: Secondary | ICD-10-CM | POA: Diagnosis not present

## 2021-10-21 DIAGNOSIS — Z89512 Acquired absence of left leg below knee: Secondary | ICD-10-CM

## 2021-10-21 DIAGNOSIS — Z823 Family history of stroke: Secondary | ICD-10-CM

## 2021-10-21 DIAGNOSIS — C109 Malignant neoplasm of oropharynx, unspecified: Secondary | ICD-10-CM | POA: Diagnosis present

## 2021-10-21 DIAGNOSIS — E440 Moderate protein-calorie malnutrition: Secondary | ICD-10-CM | POA: Diagnosis present

## 2021-10-21 DIAGNOSIS — I129 Hypertensive chronic kidney disease with stage 1 through stage 4 chronic kidney disease, or unspecified chronic kidney disease: Secondary | ICD-10-CM | POA: Diagnosis present

## 2021-10-21 DIAGNOSIS — R1310 Dysphagia, unspecified: Secondary | ICD-10-CM | POA: Diagnosis present

## 2021-10-21 DIAGNOSIS — R918 Other nonspecific abnormal finding of lung field: Secondary | ICD-10-CM | POA: Diagnosis not present

## 2021-10-21 DIAGNOSIS — D84821 Immunodeficiency due to drugs: Secondary | ICD-10-CM | POA: Diagnosis present

## 2021-10-21 DIAGNOSIS — E871 Hypo-osmolality and hyponatremia: Secondary | ICD-10-CM | POA: Diagnosis present

## 2021-10-21 DIAGNOSIS — D638 Anemia in other chronic diseases classified elsewhere: Secondary | ICD-10-CM | POA: Diagnosis not present

## 2021-10-21 DIAGNOSIS — Z791 Long term (current) use of non-steroidal anti-inflammatories (NSAID): Secondary | ICD-10-CM

## 2021-10-21 DIAGNOSIS — Z930 Tracheostomy status: Secondary | ICD-10-CM | POA: Diagnosis not present

## 2021-10-21 DIAGNOSIS — Z6821 Body mass index (BMI) 21.0-21.9, adult: Secondary | ICD-10-CM | POA: Diagnosis not present

## 2021-10-21 DIAGNOSIS — Y830 Surgical operation with transplant of whole organ as the cause of abnormal reaction of the patient, or of later complication, without mention of misadventure at the time of the procedure: Secondary | ICD-10-CM | POA: Diagnosis present

## 2021-10-21 DIAGNOSIS — E109 Type 1 diabetes mellitus without complications: Secondary | ICD-10-CM | POA: Diagnosis not present

## 2021-10-21 DIAGNOSIS — Z7960 Long term (current) use of unspecified immunomodulators and immunosuppressants: Secondary | ICD-10-CM

## 2021-10-21 DIAGNOSIS — D72829 Elevated white blood cell count, unspecified: Secondary | ICD-10-CM | POA: Diagnosis not present

## 2021-10-21 DIAGNOSIS — E860 Dehydration: Secondary | ICD-10-CM | POA: Diagnosis present

## 2021-10-21 DIAGNOSIS — G8929 Other chronic pain: Secondary | ICD-10-CM | POA: Diagnosis present

## 2021-10-21 DIAGNOSIS — R591 Generalized enlarged lymph nodes: Secondary | ICD-10-CM

## 2021-10-21 DIAGNOSIS — Z833 Family history of diabetes mellitus: Secondary | ICD-10-CM

## 2021-10-21 DIAGNOSIS — Z8249 Family history of ischemic heart disease and other diseases of the circulatory system: Secondary | ICD-10-CM

## 2021-10-21 DIAGNOSIS — E46 Unspecified protein-calorie malnutrition: Secondary | ICD-10-CM | POA: Diagnosis not present

## 2021-10-21 DIAGNOSIS — N186 End stage renal disease: Secondary | ICD-10-CM | POA: Diagnosis not present

## 2021-10-21 DIAGNOSIS — C4442 Squamous cell carcinoma of skin of scalp and neck: Secondary | ICD-10-CM | POA: Diagnosis not present

## 2021-10-21 DIAGNOSIS — Z940 Kidney transplant status: Secondary | ICD-10-CM

## 2021-10-21 LAB — LACTIC ACID, PLASMA
Lactic Acid, Venous: 0.7 mmol/L (ref 0.5–1.9)
Lactic Acid, Venous: 0.8 mmol/L (ref 0.5–1.9)

## 2021-10-21 LAB — CBC WITH DIFFERENTIAL/PLATELET
Abs Immature Granulocytes: 0 10*3/uL (ref 0.00–0.07)
Basophils Absolute: 0 10*3/uL (ref 0.0–0.1)
Basophils Relative: 0 %
Eosinophils Absolute: 0.3 10*3/uL (ref 0.0–0.5)
Eosinophils Relative: 1 %
HCT: 28.4 % — ABNORMAL LOW (ref 39.0–52.0)
Hemoglobin: 9.7 g/dL — ABNORMAL LOW (ref 13.0–17.0)
Lymphocytes Relative: 18 %
Lymphs Abs: 4.6 10*3/uL — ABNORMAL HIGH (ref 0.7–4.0)
MCH: 28.8 pg (ref 26.0–34.0)
MCHC: 34.2 g/dL (ref 30.0–36.0)
MCV: 84.3 fL (ref 80.0–100.0)
Monocytes Absolute: 2.3 10*3/uL — ABNORMAL HIGH (ref 0.1–1.0)
Monocytes Relative: 9 %
Neutro Abs: 18.4 10*3/uL — ABNORMAL HIGH (ref 1.7–7.7)
Neutrophils Relative %: 72 %
Platelets: 324 10*3/uL (ref 150–400)
RBC: 3.37 MIL/uL — ABNORMAL LOW (ref 4.22–5.81)
RDW: 13.6 % (ref 11.5–15.5)
Smear Review: ADEQUATE
WBC: 25.5 10*3/uL — ABNORMAL HIGH (ref 4.0–10.5)
nRBC: 0 % (ref 0.0–0.2)

## 2021-10-21 LAB — HEPATIC FUNCTION PANEL
ALT: 11 U/L (ref 0–44)
AST: 15 U/L (ref 15–41)
Albumin: 2.6 g/dL — ABNORMAL LOW (ref 3.5–5.0)
Alkaline Phosphatase: 103 U/L (ref 38–126)
Bilirubin, Direct: <0.1 mg/dL (ref 0.0–0.2)
Total Bilirubin: 0.5 mg/dL (ref 0.3–1.2)
Total Protein: 6.4 g/dL — ABNORMAL LOW (ref 6.5–8.1)

## 2021-10-21 LAB — CBG MONITORING, ED
Glucose-Capillary: 124 mg/dL — ABNORMAL HIGH (ref 70–99)
Glucose-Capillary: 111 mg/dL — ABNORMAL HIGH (ref 70–99)

## 2021-10-21 LAB — BASIC METABOLIC PANEL
Anion gap: 10 (ref 5–15)
Anion gap: 14 (ref 5–15)
BUN: 37 mg/dL — ABNORMAL HIGH (ref 6–20)
BUN: 40 mg/dL — ABNORMAL HIGH (ref 6–20)
CO2: 20 mmol/L — ABNORMAL LOW (ref 22–32)
CO2: 18 mmol/L — ABNORMAL LOW (ref 22–32)
Calcium: 8.5 mg/dL — ABNORMAL LOW (ref 8.9–10.3)
Calcium: 8.9 mg/dL (ref 8.9–10.3)
Chloride: 90 mmol/L — ABNORMAL LOW (ref 98–111)
Chloride: 93 mmol/L — ABNORMAL LOW (ref 98–111)
Creatinine, Ser: 2.61 mg/dL — ABNORMAL HIGH (ref 0.61–1.24)
Creatinine, Ser: 2.5 mg/dL — ABNORMAL HIGH (ref 0.61–1.24)
GFR, Estimated: 28 mL/min — ABNORMAL LOW (ref >60)
GFR, Estimated: 30 mL/min — ABNORMAL LOW (ref >60)
Glucose, Bld: 126 mg/dL — ABNORMAL HIGH (ref 70–99)
Glucose, Bld: 146 mg/dL — ABNORMAL HIGH (ref 70–99)
Potassium: 4.2 mmol/L (ref 3.5–5.1)
Potassium: 4.6 mmol/L (ref 3.5–5.1)
Sodium: 120 mmol/L — ABNORMAL LOW (ref 135–145)
Sodium: 125 mmol/L — ABNORMAL LOW (ref 135–145)

## 2021-10-21 LAB — COMPREHENSIVE METABOLIC PANEL
ALT: 9 U/L (ref 0–44)
AST: 17 U/L (ref 15–41)
Albumin: 2.8 g/dL — ABNORMAL LOW (ref 3.5–5.0)
Alkaline Phosphatase: 118 U/L (ref 38–126)
Anion gap: 11 (ref 5–15)
BUN: 38 mg/dL — ABNORMAL HIGH (ref 6–20)
CO2: 20 mmol/L — ABNORMAL LOW (ref 22–32)
Calcium: 8.6 mg/dL — ABNORMAL LOW (ref 8.9–10.3)
Chloride: 86 mmol/L — ABNORMAL LOW (ref 98–111)
Creatinine, Ser: 2.72 mg/dL — ABNORMAL HIGH (ref 0.61–1.24)
GFR, Estimated: 27 mL/min — ABNORMAL LOW (ref >60)
Glucose, Bld: 147 mg/dL — ABNORMAL HIGH (ref 70–99)
Potassium: 4.4 mmol/L (ref 3.5–5.1)
Sodium: 117 mmol/L — CL (ref 135–145)
Total Bilirubin: 0.4 mg/dL (ref 0.3–1.2)
Total Protein: 6.7 g/dL (ref 6.5–8.1)

## 2021-10-21 LAB — GLUCOSE, CAPILLARY: Glucose-Capillary: 131 mg/dL — ABNORMAL HIGH (ref 70–99)

## 2021-10-21 LAB — HIV ANTIBODY (ROUTINE TESTING W REFLEX): HIV Screen 4th Generation wRfx: NONREACTIVE

## 2021-10-21 MED ORDER — HYDROMORPHONE HCL 1 MG/ML IJ SOLN
0.5000 mg | INTRAMUSCULAR | Status: DC | PRN
  Administered 2021-10-21 – 2021-10-23 (×13): 1 mg via INTRAVENOUS
  Filled 2021-10-21 (×14): qty 1

## 2021-10-21 MED ORDER — ENOXAPARIN SODIUM 30 MG/0.3ML IJ SOSY
30.0000 mg | PREFILLED_SYRINGE | INTRAMUSCULAR | Status: DC
  Administered 2021-10-21: 30 mg via SUBCUTANEOUS
  Filled 2021-10-21: qty 0.3

## 2021-10-21 MED ORDER — METHYLPREDNISOLONE SODIUM SUCC 40 MG IJ SOLR
40.0000 mg | Freq: Every day | INTRAMUSCULAR | Status: DC
  Administered 2021-10-21 – 2021-10-24 (×4): 40 mg via INTRAVENOUS
  Filled 2021-10-21 (×4): qty 1

## 2021-10-21 MED ORDER — MYCOPHENOLATE MOFETIL HCL 500 MG IV SOLR
500.0000 mg | Freq: Two times a day (BID) | INTRAVENOUS | Status: DC
  Administered 2021-10-21 – 2021-10-23 (×4): 500 mg via INTRAVENOUS
  Filled 2021-10-21 (×6): qty 15

## 2021-10-21 MED ORDER — INSULIN ASPART 100 UNIT/ML IJ SOLN
0.0000 [IU] | INTRAMUSCULAR | Status: DC
  Administered 2021-10-21: 1 [IU] via SUBCUTANEOUS
  Administered 2021-10-22 (×2): 5 [IU] via SUBCUTANEOUS
  Administered 2021-10-23: 3 [IU] via SUBCUTANEOUS

## 2021-10-21 MED ORDER — SODIUM CHLORIDE 0.9 % IV BOLUS
1000.0000 mL | Freq: Once | INTRAVENOUS | Status: AC
  Administered 2021-10-21: 1000 mL via INTRAVENOUS

## 2021-10-21 MED ORDER — SENNA 8.6 MG PO TABS
1.0000 | ORAL_TABLET | Freq: Two times a day (BID) | ORAL | Status: DC
  Filled 2021-10-21 (×2): qty 1

## 2021-10-21 MED ORDER — BISACODYL 5 MG PO TBEC: 5.0000 mg | DELAYED_RELEASE_TABLET | Freq: Every day | ORAL | Status: DC | PRN

## 2021-10-21 MED ORDER — HYDROCORTISONE SOD SUC (PF) 100 MG IJ SOLR
20.0000 mg | Freq: Every day | INTRAMUSCULAR | Status: DC
  Administered 2021-10-21: 20 mg via INTRAVENOUS
  Filled 2021-10-21: qty 2

## 2021-10-21 MED ORDER — INSULIN GLARGINE-YFGN 100 UNIT/ML ~~LOC~~ SOLN
10.0000 [IU] | Freq: Every day | SUBCUTANEOUS | Status: DC
  Administered 2021-10-21: 10 [IU] via SUBCUTANEOUS
  Filled 2021-10-21 (×3): qty 0.1

## 2021-10-21 MED ORDER — FENTANYL CITRATE PF 50 MCG/ML IJ SOSY
50.0000 ug | PREFILLED_SYRINGE | Freq: Once | INTRAMUSCULAR | Status: AC
  Administered 2021-10-21: 50 ug via INTRAVENOUS
  Filled 2021-10-21: qty 1

## 2021-10-21 MED ORDER — DOCUSATE SODIUM 100 MG PO CAPS
100.0000 mg | ORAL_CAPSULE | Freq: Two times a day (BID) | ORAL | Status: DC
  Filled 2021-10-21 (×2): qty 1

## 2021-10-21 MED ORDER — SODIUM CHLORIDE 0.9 % IV SOLN: INTRAVENOUS | Status: DC

## 2021-10-21 MED ORDER — PROCHLORPERAZINE EDISYLATE 10 MG/2ML IJ SOLN
10.0000 mg | Freq: Once | INTRAMUSCULAR | Status: AC
Start: 2021-10-21 — End: 2021-10-21
  Administered 2021-10-21: 10 mg via INTRAVENOUS
  Filled 2021-10-21: qty 2

## 2021-10-21 MED ORDER — HYDROMORPHONE HCL 1 MG/ML IJ SOLN
1.0000 mg | Freq: Once | INTRAMUSCULAR | Status: AC
  Administered 2021-10-21: 1 mg via INTRAVENOUS
  Filled 2021-10-21: qty 1

## 2021-10-21 NOTE — ED Provider Notes (Signed)
[facility] EMERGENCY DEPARTMENT Provider Note   CSN: [id number] Arrival date & time: 10/21/21  0341     History  Chief Complaint  Patient presents with   Dysphagia    Arthur Burke is a 56 y.o. male.  56 year old male with a history of diabetes, hypertension, I renal failure status post transplant on CellCept and Prograf who presents emerged from today with drooling.  Patient states that he has had progressively worsening neck swelling for the last year or so but specifically in the last couple months where is noticed it more.  The last couple days he is got the point where he cannot swallow his medicines he does feel nauseous like he can throw up and is able to throw up but not able to swallow anything he feels like some might be stuck in the back of his throat.  He has progressively worsening neck swelling during this as well.  No fevers.  No recent infections.       Home Medications Prior to Admission medications   Medication Sig Start Date End Date Taking? Authorizing Provider  amLODipine (NORVASC) 10 MG tablet Take 1 tablet (10 mg total) by mouth daily. 09/02/17   Angiulli, Lavon Paganini, PA-C  atorvastatin (LIPITOR) 40 MG tablet Take 1 tablet (40 mg total) by mouth every evening. 09/02/17   Angiulli, Lavon Paganini, PA-C  Calcium 600-200 MG-UNIT tablet Take 1 tablet by mouth daily. 09/02/17   Angiulli, Lavon Paganini, PA-C  carvedilol (COREG) 25 MG tablet Take 1 tablet (25 mg total) by mouth 2 (two) times daily. 09/02/17   Angiulli, Lavon Paganini, PA-C  cloNIDine (CATAPRES) 0.1 MG tablet Take 1 tablet (0.1 mg total) by mouth 2 (two) times daily. 09/02/17   Angiulli, Lavon Paganini, PA-C  insulin aspart (NOVOLOG) 100 UNIT/ML FlexPen Inject 10 Units into the skin 3 (three) times daily with meals. 09/02/17   Angiulli, Lavon Paganini, PA-C  insulin glargine (LANTUS) 100 unit/mL SOPN Inject 0.32 mLs (32 Units total) into the skin at bedtime. 09/02/17   Angiulli, Lavon Paganini, PA-C  methocarbamol (ROBAXIN) 500  MG tablet Take 1 tablet (500 mg total) by mouth 2 (two) times daily as needed for muscle spasms. 11/07/17   Jamse Arn, MD  mycophenolate (CELLCEPT) 500 MG tablet Take 500 mg by mouth 2 (two) times daily. 06/03/14   [provider]  omeprazole (PRILOSEC) 20 MG capsule Take 1 capsule (20 mg total) by mouth 2 (two) times daily. 09/02/17 09/02/18  Angiulli, Lavon Paganini, PA-C  oxyCODONE (ROXICODONE) 15 MG immediate release tablet Take 15 mg by mouth every 4 (four) hours.    [provider]  polyethylene glycol (MIRALAX / GLYCOLAX) packet Take 17 g by mouth daily as needed for mild constipation. 08/26/17   Santos-Sanchez, Merlene Morse, MD  predniSONE (DELTASONE) 5 MG tablet Take 5 mg by mouth daily. 06/24/17   [provider]  ramelteon (ROZEREM) 8 MG tablet Take 1 tablet (8 mg total) by mouth at bedtime. 09/02/17   Angiulli, Lavon Paganini, PA-C  ramipril (ALTACE) 10 MG capsule Take 2 capsules (20 mg total) by mouth daily. 09/02/17   Angiulli, Lavon Paganini, PA-C  tacrolimus (PROGRAF) 1 MG capsule Take 1 mg by mouth 2 (two) times daily. 06/27/17   [provider]  vitamin B-12 (CYANOCOBALAMIN) 500 MCG tablet Take 1 tablet (500 mcg total) by mouth daily. 09/02/17   Angiulli, Lavon Paganini, PA-C      Allergies    Zolpidem    Review  of Systems   Review of Systems  Physical Exam Updated Vital Signs BP (!) 162/78    Pulse 85    Temp 98.1 F (36.7 C) (Oral)    Resp (!) 22    SpO2 97%  Physical Exam Vitals and nursing note reviewed.  HENT:     Head: Normocephalic and atraumatic.     Mouth/Throat:     Mouth: Mucous membranes are moist.     Pharynx: Oropharynx is clear.  Neck:     Comments: Patient with significant swelling around his proximal neck.  Airway is patent no obvious tonsillar swelling Pulmonary:     Effort: Pulmonary effort is normal.  Abdominal:     General: Abdomen is flat.  Musculoskeletal:        General: Normal range of motion.  Skin:    General: Skin is warm and  dry.  Neurological:     General: No focal deficit present.    ED Results / Procedures / Treatments   Labs (all labs ordered are listed, but only abnormal results are displayed) Labs Reviewed  CBC WITH DIFFERENTIAL/PLATELET - Abnormal; Notable for the following components:      Result Value   WBC 25.5 (*)    RBC 3.37 (*)    Hemoglobin 9.7 (*)    HCT 28.4 (*)    Neutro Abs 18.4 (*)    Lymphs Abs 4.6 (*)    Monocytes Absolute 2.3 (*)    All other components within normal limits  COMPREHENSIVE METABOLIC PANEL - Abnormal; Notable for the following components:   Sodium 117 (*)    Chloride 86 (*)    CO2 20 (*)    Glucose, Bld 147 (*)    BUN 38 (*)    Creatinine, Ser 2.72 (*)    Calcium 8.6 (*)    Albumin 2.8 (*)    GFR, Estimated 27 (*)    All other components within normal limits    EKG None  Radiology CT Soft Tissue Neck Wo Contrast  Result Date: 10/21/2021 CLINICAL DATA:  Epiglottitis or tonsillitis suspected. Severe neck pain with inability to swallow. Lymph node swelling for months, slowly worsening. EXAM: CT NECK WITHOUT CONTRAST TECHNIQUE: Multidetector CT imaging of the neck was performed following the standard protocol without intravenous contrast. RADIATION DOSE REDUCTION: This exam was performed according to the departmental dose-optimization program which includes automated exposure control, adjustment of the mA and/or kV according to patient size and/or use of iterative reconstruction technique. COMPARISON:  None. FINDINGS: Pharynx and larynx: Bulky mass at the left glossotonsillar sulcus which measures 3.2 cm. Salivary glands: The submandibular glands are distorted by cervical adenopathy. No primary salivary disease suspected Thyroid: Normal Lymph nodes: Bulky bilateral jugular chain lymphadenopathy with cavitary features even by noncontrast technique. Nodal conglomerate in the left upper jugular chain measures up to 5 cm with indistinct, lobulated margins. Vascular:  Limited without contrast Limited intracranial: Extensive atherosclerotic calcification for age. Visualized orbits: Right scleral band Mastoids and visualized paranasal sinuses: No acute finding. Right maxillary sinus atelectasis. Skeleton: No visible hematogenous metastasis. 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 Even without contrast there is probable extracapsular tumor on the left. Electronically Signed   By: Jorje Guild M.D.   On: 10/21/2021 06:38    Procedures .Critical Care Performed by: Merrily Pew, MD Authorized by: Merrily Pew, MD   Critical care provider statement:    Critical care time (minutes):  30  Critical care was necessary to treat or prevent imminent or life-threatening deterioration of the following conditions:  Metabolic crisis, endocrine crisis and dehydration   Critical care was time spent personally by me on the following activities:  Development of treatment plan with patient or surrogate, discussions with consultants, evaluation of patient's response to treatment, examination of patient, ordering and review of laboratory studies, ordering and review of radiographic studies, ordering and performing treatments and interventions, pulse oximetry, re-evaluation of patient's condition and review of old charts    Medications Ordered in ED Medications  sodium chloride 0.9 % bolus 1,000 mL (0 mLs Intravenous Stopped 10/21/21 0558)  HYDROmorphone (DILAUDID) injection 1 mg (1 mg Intravenous Given 10/21/21 0528)  prochlorperazine (COMPAZINE) injection 10 mg (10 mg Intravenous Given 10/21/21 2671)    ED Course/ Medical Decision Making/ A&P                           Medical Decision Making Amount and/or Complexity of Data Reviewed Labs: ordered. Radiology: ordered.  Risk Prescription drug management.   Initial concern for abscess versus severe sialoadenitis versus lymphoma or other  cancers with CT scan done.  Not able to use contrast secondary to acute kidney injury and history of transplant.  Fluids started for hyponatremia and AKI.  His CT scan is concerning for a posterior tongue mass with bilateral significant swollen lymph nodes.  Discussed with oncology, Dr. Burr Medico who will help manage the patient and see them tomorrow morning.  I discussed with Dr. Constance Holster with ENT who will see the patient today as well.  We will attempt to pass an NG tube in the meantime.  Otherwise patient will need medications through his IV. Medicine paged for admission.   Care transferred pending call back for admission.    Final Clinical Impression(s) / ED Diagnoses Final diagnoses:  None    Rx / DC Orders ED Discharge Orders     None         Drayce Tawil, Corene Cornea, MD 10/21/21 [phone]

## 2021-10-21 NOTE — Hospital Course (Addendum)
Feels about the same as yesterday. Has seen a couple of doctors but does not remember which specialties.  Discussed process of getting a PEG tube and risks/benefits involved. Also discussed need for biopsy of neck mass. He is agreeable to both of these interventions.   Not having much pain in neck.   Has 2 pinched nerves in back that cause pain and also has diabetic nerve pain.  His brother is the only person he has and would like for [place] to contact him. Updated brother's phone number on to Epic.

## 2021-10-21 NOTE — Final Consult Note (Addendum)
[place]  Telephone:([phone]) [phone]   [place]  DOB: 10-Dec-1965  MR#: [id number]  CSN#: [id number]    Requesting Physician: ED Dr. Dayna Barker   Patient Care Team: Patient, No Pcp Per (Inactive) as PCP - General (General Practice)  Reason for consult: locally advanced oropharyngeal cancer  History of present illness: Patient is a 56 year old gentleman with past medical history of end-stage renal disease status post retransplant in 2005, on Prograf and low-dose prednisone, type 1 diabetes, hypertension, left BKA, presented with progressive dysphagia.  He noticed bilateral neck mass for the past 3 to 4 weeks, progressive dysphagia in the past week, he was not able to swallow his medications, or any food yesterday, and came into the emergency room.  CT neck showed a bulky mass at the left glossotonsillar sulcus measuring 3.2 cm, and bulky bilateral jugular chain lymphadenopathy.  Patient was seen by ENT Dr. Constance Holster today, will recommend IR biopsy.   MEDICAL HISTORY:  Past Medical History:  Diagnosis Date   Acute osteomyelitis of left calcaneus ([place])    Diabetes mellitus without complication (HCC)    Diabetic foot infection ([place])    Hypertension    Infected ulcer of skin, with fat layer exposed ([place])    Lower limb ulcer, ankle, left, with necrosis of muscle (HCC)    MRSA (methicillin resistant staph aureus) culture positive    Renal disorder    Vision impairment     SURGICAL HISTORY: Past Surgical History:  Procedure Laterality Date   AMPUTATION Left 08/23/2017   Procedure: AMPUTATION BELOW KNEE;  Surgeon: Newt Minion, MD;  Location: [facility];  Service: Orthopedics;  Laterality: Left;   APPLICATION OF WOUND VAC Left 08/23/2017   Procedure: APPLICATION OF WOUND VAC;  Surgeon: Newt Minion, MD;  Location: [facility];  Service: Orthopedics;  Laterality: Left;   KIDNEY TRANSPLANT  2006   [facility]   SKIN GRAFT     L diabetic foot wound  with debriedment and skin grafting   TOE AMPUTATION     R great and 2nd toe    SOCIAL HISTORY: Social History   Socioeconomic History   Marital status: Divorced    Spouse name: Not on file   Number of children: Not on file   Years of education: Not on file   Highest education level: Not on file  Occupational History   Not on file  Tobacco Use   Smoking status: Never   Smokeless tobacco: Never  Vaping Use   Vaping Use: Former  Substance and Sexual Activity   Alcohol use: No   Drug use: No   Sexual activity: Not on file  Other Topics Concern   Not on file  Social History Narrative   Pt plans to be discharge home with mother    Social Determinants of Health   Financial Resource Strain: Not on file  Food Insecurity: Not on file  Transportation Needs: Not on file  Physical Activity: Not on file  Stress: Not on file  Social Connections: Not on file  Intimate Partner Violence: Not on file    FAMILY HISTORY: History reviewed. No pertinent family history.  ALLERGIES:  is allergic to zolpidem.  MEDICATIONS:  Current Facility-Administered Medications  Medication Dose Route Frequency Provider Last Rate Last Admin   0.9 %  sodium chloride infusion   Intravenous Continuous Madalyn Rob, MD 100 mL/hr at 10/21/21 1841 Restarted at 10/21/21 1841   bisacodyl (DULCOLAX) EC tablet  5 mg  5 mg Oral Daily PRN Madalyn Rob, MD       docusate sodium (COLACE) capsule 100 mg  100 mg Oral BID Madalyn Rob, MD       enoxaparin (LOVENOX) injection 30 mg  30 mg Subcutaneous Q24H Madalyn Rob, MD   30 mg at 10/21/21 1336   HYDROmorphone (DILAUDID) injection 0.5-1 mg  0.5-1 mg Intravenous Q3H PRN Madalyn Rob, MD   1 mg at 10/21/21 2115   insulin aspart (novoLOG) injection 0-9 Units  0-9 Units Subcutaneous Q4H Madalyn Rob, MD   1 Units at 10/21/21 2115   insulin glargine-yfgn (SEMGLEE) injection 10 Units  10 Units Subcutaneous QHS Madalyn Rob, MD       methylPREDNISolone sodium succinate  (SOLU-MEDROL) 40 mg/mL injection 40 mg  40 mg Intravenous Daily Madalyn Rob, MD   40 mg at 10/21/21 2007   mycophenolate (CELLCEPT) 500 mg in dextrose 5 % 85 mL IVPB  500 mg Intravenous Q12H Angelica Pou, MD   Stopped at 10/21/21 1839   senna (SENOKOT) tablet 8.6 mg  1 tablet Oral BID Madalyn Rob, MD        REVIEW OF SYSTEMS:   Constitutional: Denies fevers, chills or abnormal night sweats, (+) weight loss  Eyes: Denies blurriness of vision, double vision or watery eyes Ears, nose, mouth, throat, and face: Denies mucositis or sore throat Respiratory: Denies cough, dyspnea or wheezes Cardiovascular: Denies palpitation, chest discomfort or lower extremity swelling Gastrointestinal:  (+) Dysphagia.  Denies nausea, heartburn or change in bowel habits Skin: Denies abnormal skin rashes Lymphatics: Denies new lymphadenopathy or easy bruising Neurological:Denies numbness, tingling or new weaknesses Behavioral/Psych: Mood is stable, no new changes  All other systems were reviewed with the patient and are negative.  PHYSICAL EXAMINATION: ECOG PERFORMANCE STATUS: 2 - Symptomatic, <50% confined to bed  Vitals:   10/21/21 1858 10/21/21 2047  BP: (!) 167/74 (!) 165/77  Pulse: 77 76  Resp:  16  Temp: 98.3 F (36.8 C) 98.1 F (36.7 C)  SpO2: 98% 95%   Filed Weights   10/21/21 1900  Weight: 158 lb 11.7 oz (72 kg)    GENERAL:alert, no distress and comfortable SKIN: skin color, texture, turgor are normal, no rashes or significant lesions EYES: normal, conjunctiva are pink and non-injected, sclera clear NECK: supple, thyroid normal size, non-tender, without nodularity. Bulky bilateral adenopathy in the upper neck, measuring 4X9 cm on the left, and 3 x 6 cm on the right. LUNGS: clear to auscultation and percussion with normal breathing effort HEART: regular rate & rhythm and no murmurs and no lower extremity edema ABDOMEN:abdomen soft, non-tender and normal bowel  sounds Musculoskeletal:no cyanosis of digits and no clubbing  PSYCH: alert & oriented x 3 with fluent speech NEURO: no focal motor/sensory deficits  LABORATORY DATA:  I have reviewed the data as listed Lab Results  Component Value Date   WBC 25.5 (H) 10/21/2021   HGB 9.7 (L) 10/21/2021   HCT 28.4 (L) 10/21/2021   MCV 84.3 10/21/2021   PLT 324 10/21/2021   Recent Labs    10/21/21 0420 10/21/21 0929  NA 117* 120*  K 4.4 4.2  CL 86* 90*  CO2 20* 20*  GLUCOSE 147* 126*  BUN 38* 37*  CREATININE 2.72* 2.61*  CALCIUM 8.6* 8.5*  GFRNONAA 27* 28*  PROT 6.7 6.4*  ALBUMIN 2.8* 2.6*  AST 17 15  ALT 9 11  ALKPHOS 118 103  BILITOT 0.4 0.5  BILIDIR  --  <  0.1  IBILI  --  NOT CALCULATED    RADIOGRAPHIC STUDIES: I have personally reviewed the radiological images as listed and agreed with the findings in the report. CT Soft Tissue Neck Wo Contrast  Result Date: 10/21/2021 CLINICAL DATA:  Epiglottitis or tonsillitis suspected. Severe neck pain with inability to swallow. Lymph node swelling for months, slowly worsening. EXAM: CT NECK WITHOUT CONTRAST TECHNIQUE: Multidetector CT imaging of the neck was performed following the standard protocol without intravenous contrast. RADIATION DOSE REDUCTION: This exam was performed according to the departmental dose-optimization program which includes automated exposure control, adjustment of the mA and/or kV according to patient size and/or use of iterative reconstruction technique. COMPARISON:  None. FINDINGS: Pharynx and larynx: Bulky mass at the left glossotonsillar sulcus which measures 3.2 cm. Salivary glands: The submandibular glands are distorted by cervical adenopathy. No primary salivary disease suspected Thyroid: Normal Lymph nodes: Bulky bilateral jugular chain lymphadenopathy with cavitary features even by noncontrast technique. Nodal conglomerate in the left upper jugular chain measures up to 5 cm with indistinct, lobulated margins. Vascular:  Limited without contrast Limited intracranial: Extensive atherosclerotic calcification for age. Visualized orbits: Right scleral band Mastoids and visualized paranasal sinuses: No acute finding. Right maxillary sinus atelectasis. Skeleton: No visible hematogenous metastasis. 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 Even without contrast there is probable extracapsular tumor on the left. Electronically Signed   By: Jorje Guild M.D.   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 Constance Holster does not think his disease is resectable, and recommends chemoradiation -I briefly discussed chemo with him.  He is not a candidate for cisplatin due to his renal disease.  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 Isidore Moos for radiation, and my partner Dr. Chryl Heck for chemo treatment. Will message ENT navigator Anderson Malta to coordinate his care. -Continue supportive care. I will see him as needed before discharge.    All questions were answered. The patient knows to call the clinic with any problems, questions or concerns.      Truitt Merle, MD 10/21/2021

## 2021-10-21 NOTE — ED Notes (Signed)
Attempted placement of NG tube 52F. Placement was unsuccessful. When trying to place there was resistance.  ?

## 2021-10-21 NOTE — Consult Note (Signed)
Reason for Consult: Oropharyngeal tumor ?Referring Physician: Angelica Pou, MD ? ?Arthur Burke is an 56 y.o. male.  ?HPI: History of renal transplant, history of chronic hypertension and diabetes, has had a lump in his neck for about a year at least.  He has not been to a doctor for this.  He started having swelling of his throat yesterday.  He is lost about 14 pounds over the past couple of months due to poor appetite.  He is not able to swallow any medications now.  He has a remote smoking history.  He does not drink alcohol.  He denies any sore throat or ear pain.  He has no difficulty breathing. ? ?Past Medical History:  ?Diagnosis Date  ? Acute osteomyelitis of left calcaneus (HCC)   ? Diabetes mellitus without complication ([place])   ? Diabetic foot infection ([place])   ? Hypertension   ? Infected ulcer of skin, with fat layer exposed ([place])   ? Lower limb ulcer, ankle, left, with necrosis of muscle ([place])   ? MRSA (methicillin resistant staph aureus) culture positive   ? Renal disorder   ? Vision impairment   ? ? ?Past Surgical History:  ?Procedure Laterality Date  ? AMPUTATION Left 08/23/2017  ? Procedure: AMPUTATION BELOW KNEE;  Surgeon: Newt Minion, MD;  Location: [facility];  Service: Orthopedics;  Laterality: Left;  ? APPLICATION OF WOUND VAC Left 08/23/2017  ? Procedure: APPLICATION OF WOUND VAC;  Surgeon: Newt Minion, MD;  Location: [facility];  Service: Orthopedics;  Laterality: Left;  ? KIDNEY TRANSPLANT  2006  ? [facility]  ? SKIN GRAFT    ? L diabetic foot wound with debriedment and skin grafting  ? TOE AMPUTATION    ? R great and 2nd toe  ? ? ?No family history on file. ? ?Social History:  reports that he has never smoked. He has never used smokeless tobacco. He reports that he does not drink alcohol and does not use drugs. ? ?Allergies:  ?Allergies  ?Allergen Reactions  ? Zolpidem Other (See Comments)  ?  Other Reaction: CNS Disorder  ? ? ?Medications: Reviewed ? ?Results for orders placed or performed  during the hospital encounter of 10/21/21 (from the past 48 hour(s))  ?CBC with Differential     Status: Abnormal  ? Collection Time: 10/21/21  4:20 AM  ?Result Value Ref Range  ? WBC 25.5 (H) 4.0 - 10.5 K/uL  ? RBC 3.37 (L) 4.22 - 5.81 MIL/uL  ? Hemoglobin 9.7 (L) 13.0 - 17.0 g/dL  ? HCT 28.4 (L) 39.0 - 52.0 %  ? MCV 84.3 80.0 - 100.0 fL  ? MCH 28.8 26.0 - 34.0 pg  ? MCHC 34.2 30.0 - 36.0 g/dL  ? RDW 13.6 11.5 - 15.5 %  ? Platelets 324 150 - 400 K/uL  ? nRBC 0.0 0.0 - 0.2 %  ? Neutrophils Relative % 72 %  ? Neutro Abs 18.4 (H) 1.7 - 7.7 K/uL  ? Lymphocytes Relative 18 %  ? Lymphs Abs 4.6 (H) 0.7 - 4.0 K/uL  ? Monocytes Relative 9 %  ? Monocytes Absolute 2.3 (H) 0.1 - 1.0 K/uL  ? Eosinophils Relative 1 %  ? Eosinophils Absolute 0.3 0.0 - 0.5 K/uL  ? Basophils Relative 0 %  ? Basophils Absolute 0.0 0.0 - 0.1 K/uL  ? WBC Morphology MORPHOLOGY UNREMARKABLE   ? RBC Morphology MORPHOLOGY UNREMARKABLE   ? Smear Review PLATELETS APPEAR ADEQUATE   ? Abs Immature Granulocytes  0.00 0.00 - 0.07 K/uL  ?  Comment: Performed at [facility], [place] [address], [place], [place] [address]  ?Comprehensive metabolic panel     Status: Abnormal  ? Collection Time: 10/21/21  4:20 AM  ?Result Value Ref Range  ? Sodium 117 (LL) 135 - 145 mmol/L  ?  Comment: CRITICAL RESULT CALLED TO, READ BACK BY AND VERIFIED WITH: ?[facility], [place] 10/21/21 A GASKINS ?  ? Potassium 4.4 3.5 - 5.1 mmol/L  ? Chloride 86 (L) 98 - 111 mmol/L  ? CO2 20 (L) 22 - 32 mmol/L  ? Glucose, Bld 147 (H) 70 - 99 mg/dL  ?  Comment: Glucose reference range applies only to samples taken after fasting for at least 8 hours.  ? BUN 38 (H) 6 - 20 mg/dL  ? Creatinine, Ser 2.72 (H) 0.61 - 1.24 mg/dL  ? Calcium 8.6 (L) 8.9 - 10.3 mg/dL  ? Total Protein 6.7 6.5 - 8.1 g/dL  ? Albumin 2.8 (L) 3.5 - 5.0 g/dL  ? AST 17 15 - 41 U/L  ? ALT 9 0 - 44 U/L  ? Alkaline Phosphatase 118 38 - 126 U/L  ? Total Bilirubin 0.4 0.3 - 1.2 mg/dL  ? GFR, Estimated 27 (L) >60 mL/min  ?  Comment:  (NOTE) ?Calculated using the CKD-EPI Creatinine Equation (2021) ?  ? Anion gap 11 5 - 15  ?  Comment: Performed at [facility], [place] [address] [place], [place] [address]  ? ? ?CT Soft Tissue Neck Wo Contrast ? ?Result Date: 10/21/2021 ?CLINICAL DATA:  Epiglottitis or tonsillitis suspected. Severe neck pain with inability to swallow. Lymph node swelling for months, slowly worsening. EXAM: CT NECK WITHOUT CONTRAST TECHNIQUE: Multidetector CT imaging of the neck was performed following the standard protocol without intravenous contrast. RADIATION DOSE REDUCTION: This exam was performed according to the departmental dose-optimization program which includes automated exposure control, adjustment of the mA and/or kV according to patient size and/or use of iterative reconstruction technique. COMPARISON:  None. FINDINGS: Pharynx and larynx: Bulky mass at the left glossotonsillar sulcus which measures 3.2 cm. Salivary glands: The submandibular glands are distorted by cervical adenopathy. No primary salivary disease suspected Thyroid: Normal Lymph nodes: Bulky bilateral jugular chain lymphadenopathy with cavitary features even by noncontrast technique. Nodal conglomerate in the left upper jugular chain measures up to 5 cm with indistinct, lobulated margins. Vascular: Limited without contrast Limited intracranial: Extensive atherosclerotic calcification for age. Visualized orbits: Right scleral band Mastoids and visualized paranasal sinuses: No acute finding. Right maxillary sinus atelectasis. Skeleton: No visible hematogenous metastasis. 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 Even without contrast there is probable extracapsular tumor on the left. Electronically Signed   By: Jorje Guild M.D.   On: 10/21/2021 06:38   ? ?PPI:RJJOACZY except as listed in admit H&P ? ?Blood pressure (!) 166/74, pulse 75, temperature 98.1 ?F (36.7  ?C), temperature source Oral, resp. rate 15, SpO2 99 %. ? ?PHYSICAL EXAM: ?Overall appearance:  Healthy appearing, in no distress, breathing and voice are clear ?Head:  Normocephalic, atraumatic. ?Ears: External auditory canals are clear; tympanic membranes are intact in the middle ears are free of any effusion. ?Nose: External nose is healthy in appearance. Internal nasal exam free of any lesions or obstruction. ?Oral Cavity/Pharynx: Oral exam is a little bit challenging with him.  There is what appears to be a large mass along the left oropharynx.Marland Kitchen ?  Larynx/Hypopharynx: See below ?Neuro:  No identifiable neurologic deficits. ?Neck: Massive bilateral jugular adenopathy. ? ?Studies Reviewed: CT neck ? ? ? ? ? ?Procedures: Flexible fiberoptic transnasal laryngoscopy: ? ?Topical Xylocaine/Afrin was applied to the nasal cavities.  The scope was passed down the left nasal cavity.  The left nasal cavity was clear.  The nasopharynx was clear.  In the oropharynx there is a large exophytic mass arising from the lateral aspect.  The airway is not obstructed.  The larynx appears clear.  Is very difficult to visualize the cords due to AP collapse of the supraglottic larynx with inspiration he is able to widely open the glottis and there is no obstruction.  The tumor does seem to extend down to the vallecula. ? ? 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  Recommend interventional radiology performed a core needle biopsy.  We will send this for routine pathology and for HPV testing.  Based on the extent of the disease this is not a surgical treatment.  Standard for this would be chemotherapy and radiation therapy in the absence of distant metastatic disease.  We will need radiation and medical oncology input.  I do not know if he can even have chemotherapy.  He will certainly need a feeding tube and this will need to be done as an open gastrostomy by general surgery.  Airway is not an issue  currently. ? ?Z79.0 ?C10.9 ? ? ?Medical Decision Making: ?#/Complex Problems: 4  Data Reviewed:4  Management:4 ?(1-Straightforward, 2-Low, 3-Moderate, 4-High) ? ? ?Izora Gala ?10/21/2021, 10:55 AM  ? ? ? ? ? ?

## 2021-10-21 NOTE — ED Triage Notes (Addendum)
Arrives EMS from home with c/o difficulty swallowing. Lymph nodus swollen for several months but slowly getting worse. Approx 3 hours pta pt unable to swallow and spitting up. Hx kidney transplant.  ?

## 2021-10-21 NOTE — H&P (Addendum)
Date: 10/21/2021               Patient Name:  Arthur Burke MRN: [id number]  DOB: 03/29/1966 Age / Sex: 56 y.o., male   PCP: Patient, No Pcp Per (Inactive)              Medical Service: Internal Medicine Teaching Service              Attending Physician: Dr. Jimmye Norman, Elaina Pattee, MD    First Contact: Nada Maclachlan, [place] 3 Pager: [phone]  Second Contact: Dr. Leonor Liv Pager: [phone]  Third Contact Dr. 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 Patient states he started noticing neck swelling now ongoing for 3 months. Patient states Left side of neck started swelling first and then right side began to swell, with significant increase in swelling over the past 2-3 weeks. Patient states he has lost 14 lbs over last 2-3 weeks, down to 161lbs. States this is due to appetite loss. He denies fevers and night sweats. Patient states has been having difficulty swallowing since Saturday, he hasn't been able to eat/drink or take medications since Saturday morning. Patient states he has been experiencing nausea since this AM but has not vomited. Patient also with left side of abdominal pain where is kidney transplant is. He has been evaluated by his transplant doctors previously and this has been marked up to nerve damage during transplant or secondary to him being diabetic. No changes to chronic pain. No known hx of HPV infection.  He has been experiencing multiple sinus infections recently. These started 2-3 weeks ago, with maxillary sinus pain and pressure, congestion, no tooth pain or fevers during this time. Resolved now.   Patient with hx of poor dentition and multiple extractions due to cavities, but no recent infections. He was evaluated this past weekend at his doctors, neck swelling was noted, but was told that they would continue to monitor and reassess at next visit.   Denies fatigue, SOB, Chest pain, palpitations, dysuria, burning urination, constipation, changes to stool/urine color and consistency.  ED course: Patient arrived for dysphagia. CBC: WBC 25.5, Hgb 9.7; CMP: Na 117, glucose 147, BUN 38, creatinine 2.72, GFR 27. CT w/o contrast (2/2 AKI and hx of kidney transplant) of neck ordered. 1L NS bolus started for hyponatremia and AKI.  CT scan concerning for a posterior tongue mass with bilateral significant swollen lymph nodes. Oncology consult placed, will see patient tomorrow morning. ENT consult placed, will see the patient today. NG tube placement attempted, unsuccessful due to encountered resistance.  Meds:  Current Meds  Medication Sig   amLODipine (NORVASC) 10 MG tablet Take 1 tablet (10 mg total) by mouth daily.   ascorbic acid (VITAMIN C) 500 MG tablet Take 500 mg by mouth daily.   atorvastatin (LIPITOR) 40 MG tablet Take 1 tablet (40 mg total) by mouth every evening.   Calcium 600-200 MG-UNIT tablet Take 1 tablet by mouth daily.   carvedilol (COREG) 25 MG tablet Take 1 tablet (25 mg total) by mouth 2 (two) times daily.   cloNIDine (CATAPRES) 0.1 MG tablet Take  1 tablet (0.1 mg total) by mouth 2 (two) times daily.   furosemide (LASIX) 40 MG tablet Take 40 mg by mouth 2 (two) times daily.   HUMALOG KWIKPEN 100 UNIT/ML KwikPen Inject 10 Units into the skin 3 (three) times daily.   insulin glargine (LANTUS) 100 unit/mL SOPN Inject 0.32 mLs (32 Units total) into the skin at bedtime.   meloxicam (MOBIC) 15 MG tablet Take 15 mg by mouth daily as needed for pain (arthritis).   Multiple Vitamins-Minerals (VITRUM SENIOR) TABS Take 1 tablet by mouth daily.   mycophenolate (CELLCEPT) 500 MG tablet Take  500 mg by mouth 2 (two) times daily.   omeprazole (PRILOSEC) 20 MG capsule Take 1 capsule (20 mg total) by mouth 2 (two) times daily.   oxyCODONE (ROXICODONE) 15 MG immediate release tablet Take 15 mg by mouth every 4 (four) hours.   polyethylene glycol (MIRALAX / GLYCOLAX) packet Take 17 g by mouth daily as needed for mild constipation.   predniSONE (DELTASONE) 5 MG tablet Take 5 mg by mouth daily.   ramipril (ALTACE) 10 MG capsule Take 2 capsules (20 mg total) by mouth daily.   sulfamethoxazole-trimethoprim (BACTRIM DS) 800-160 MG tablet Take 1 tablet by mouth 2 (two) times daily.   tacrolimus (PROGRAF) 1 MG capsule Take 1 mg by mouth 2 (two) times daily.   vitamin B-12 (CYANOCOBALAMIN) 500 MCG tablet Take 1 tablet (500 mcg total) by mouth daily.     Allergies: Allergies as of 10/21/2021 - Review Complete 10/21/2021  Allergen Reaction Noted   Zolpidem Other (See Comments) 08/21/2017   Past Medical History:  Diagnosis Date   Acute osteomyelitis of left calcaneus (HCC)    Diabetes mellitus without complication (HCC)    Diabetic foot infection ([place])    Hypertension    Infected ulcer of skin, with fat layer exposed ([place])    Lower limb ulcer, ankle, left, with necrosis of muscle (HCC)    MRSA (methicillin resistant staph aureus) culture positive    Renal disorder    Vision impairment     Family History:  -Father, CAD, Stroke   -Maternal Grandfather, CAD -Maternal Grandmother, Diabetes    -Mother, HBP   -patient states no family hx of cancers   Social History:  -Patient lives alone in [place] and is able to perform all ADLs on his own. Patient identifies brother as next of kin and medical decision maker -5 year smoking hx of 1.5pack/day, has quit for the past 20 years now  -chewing Tobacco, use ongoing, 1 pack/day -no hx of alcohol use   Review of Systems: A complete ROS was negative except as per HPI.   Physical Exam: Blood pressure (!) 166/74, pulse 75, temperature  98.1 F (36.7 C), temperature source Oral, resp. rate 15, SpO2 99 %. Constitutional: Middle aged man in no acute distress, alert and conversant HEENT: Normocephalic, atraumatic, rash present on right frontal scalp, mild erythema, non-painful to palpation, normal sized tonsils, lips, mucosa and tongue appear normal, significant bilateral swelling of proximal neck, airway is patent Cardio: normal rate and rhythm, systolic murmur heard over P,M,T regions Pulm: CTAB, no increased work of breathing on room air Abd: soft, non-distended, nonpainful to palpation, +normal bowel sounds MSK: moving all limbs spontaneously Extremities: BKA of right foot, 2+ edema present to mid shin of left foot, AVF L arm with audible bruit Neuro: no focal neurological deficits Psych: normal affect  EKG: n/a  CXR: n/a  Assessment & Plan by Problem: Principal Problem:  Dehydration with hyponatremia  #Oropharyngeal tumor,  concerning for squamous cell carcinoma Patient presenting today with progressive non-tender, significant bilateral swelling of proximal neck, and 14lb weight loss over 2-3 weeks. Presentation concerning for abscess vs lymphadenitis vs malignancy. Patient afebrile today and given absence of infectious source and progressive worsening over extended period of time abscess vs lymphadenitis less likely. CT neck today concerning for a posterior tongue mass with bilateral significant swollen lymph nodes. Oncology consult placed, will see patient tomorrow morning. ENT consult placed, will see the patient today. Patient with hx of significant chewing tobacco use increasing his rick for squamous cell carcinoma. Given this and CT findings malignancy is likely diagnosis. Will need to consult cortrack team due to unsuccessful NG tube placement, unable to do today but order has been placed.  -cortrack placement, call team in the am -follow up ENT and Oncology recommendations   #Hyponatremia Patient with  asymptomatic hyponatremia with Na of 117 today. On exam patient wit 2+ edema of right lower leg to shin and has hypoalbuminemia on labs. Review of last nephrology note at [facility] show patient had proteinuria but improving on review of labs from 3/30 >9/28. Likely intravascularly volume down and this represents hypovolemic hyponatremia in setting of poor PO intake. Na improving with bolus, will continue NS, goal correction 6 to 8 meq ( 123 to 125) in next 24 hours.  - NS @ 100 ml /hr - Q6 BMP  #Pre- renal AKI Hx of renal failure s/p transplant Patient with left kidney transplant in place since 2005, followed by Dr. Susy Frizzle at [facility] nephrology. SCr has been slightly elevated but stable for several years, 2.2 mg/dL during last follow-up visit (05/09/21). Was noted to be rising somewhat but expectantly since patient euvolemic after starting Lasix at 40 mg daily. Patient with with glucose 147, BUN 38, creatinine 2.72, GFR 27 today likely secondary to dehydration and reduced po intake over past two weeks. Patient also with chronic pain of transplant site likely 2/2 to nerve damage, no changes to pain over kidney during exam. Will give fluids gently as above. -curbside nephrology who should be formally consulted if patient kidney function does not correct with IV fluids. -Patient is on tacrolimus, mycophenolate , and prednisone.  Cannot give alternative to tacrolimus while n.p.o., will start Solu-Medrol 40 mg daily until patient can take p.o. intake.  Mycophenolate converted to IV formulation . Consult placed to pharmacy for help with monitoring and dosing of immunosuppressants.  #Chronic pain -Patient on oxycodone 15 mg every 4 hours at home -prn dilaudid 0.5-'1mg'$  every 3 hours  Normocytic anemia Patient with Hgb 9.7 and normal MCV, consistent with normocytic anemia. Hgb 10.3 (05/09/21), has not significantly dropped but is downtrending. Patient asymptomatic today with no complaints of fatigue, SOB, palpitations,  or dizziness. No acute signs of bleeding noted today, anemia due to bleeding source not a significant concern at this moment. Anemia could be secondary to chronic disease vs 2/2 to prograf use.  -f/u Ferritin, Iron, and TIBC, retic -repeat CBC  Leukocytosis Patient with leukocytosis, WBC to 25.5 today. He is afebrile on admission and denies infectious sx today such as fever, SOB, and urinary changes. Patient is currently on immunosuppressive therapy Prograf and low dose prednisone for renal failure s/p transplant (left kidney, 2005). Given immunosuppressant regimen underlying infection cannot be ruled out.  Given chronic pred use, leukocytosis could be secondary to chronic elevation, given elevation since last WBC 16.3 (05/09/21) and no changes to dosage at that time,  this is less likely. Reactive process vs elevation due to malignancy also on the differential. Will continue to monitor patient for signs of infection, lactic acid pending. -lactic acid pending -consider blood cultures,chest xray, and  UA if clinically worsened  -repeat CBC  T1DM Patient with glucose 147 today, will likely need to be NPO until treatment plan discussed. Unable to place cortrack until tomorrow, will start 10 basal insulin SQ with sliding scale. -10 basal insulin SQ with sliding scale -CBG q4h  HTN Patient BP elevated to 169/77, has not been able to take po medications since yesterday morning, elevation likely 2/2 to this. -Hydralazine '5mg'$  q4h  Dispo: Admit patient to Inpatient with expected length of stay greater than 2 midnights.  Signed: Tyna Jaksch, Medical Student 10/21/2021, 11:06 AM   Attestation for Student Documentation:  I personally was present and performed or re-performed the history, physical exam and medical decision-making activities of this service and have verified that the service and findings are accurately documented in the students note.  Madalyn Rob, MD 10/21/2021, 1:03  PM

## 2021-10-21 NOTE — TOC Initial Note (Signed)
Transition of Care (TOC) - Initial/Assessment Note  ? ? ?Patient Details  ?Name: Arthur Burke ?MRN: [id number] ?Date of Birth: [birth year] ? ?Transition of Care (TOC) CM/SW Contact:    ?Verdell Carmine, RN ?Phone Number: ?10/21/2021, 11:39 AM ? ?Clinical Narrative:                 ? ?56 yo history of kidney failure with kidney transplant. Has had progressive neck swelling for a year. Does not have PCP listed. ENT and oncology on consult, likely malignant tumor.  ?The patient is not a surgical candidate at this time, oncology pending. He will need gastrostomy tube ( could not pass NG), hydration and tube feedings. Would recommend palliative involvement for goals of care after the plan is set in place for treatment. ? ?Cm will follow for needs, recommendations, and transitions. ?  ?Barriers to Discharge: Continued Medical Work up ? ? ?Patient Goals and CMS Choice ?  ?  ?  ? ?Expected Discharge Plan and Services ?  ?  ?  ?  ?Living arrangements for the past 2 months: Apartment ?                ?  ?  ?  ?  ?  ?  ?  ?  ?  ?  ? ?Prior Living Arrangements/Services ?Living arrangements for the past 2 months: Apartment ?Lives with:: Self ?Patient language and need for interpreter reviewed:: Yes ?       ?Need for Family Participation in Patient Care: Yes (Comment) ?Care giver support system in place?: Yes (comment) ?  ?Criminal Activity/Legal Involvement Pertinent to Current Situation/Hospitalization: No - Comment as needed ? ?Activities of Daily Living ?  ?  ? ?Permission Sought/Granted ?  ?  ?   ?   ?   ?   ? ?Emotional Assessment ?  ?  ?  ?Orientation: : Oriented to Self, Oriented to Place, Oriented to  Time ?Alcohol / Substance Use: Not Applicable ?Psych Involvement: No (comment) ? ?Admission diagnosis:  Dehydration with hyponatremia [E86.0, E87.1] ?Patient Active Problem List  ? Diagnosis Date Noted  ? Dehydration with hyponatremia 10/21/2021  ? Fall   ? Essential hypertension   ? Unilateral complete BKA, left, sequela  ([place])   ? Leukocytosis   ? AKI (acute kidney injury) ([place])   ? Diabetic peripheral neuropathy associated with type 2 diabetes mellitus ([place])   ? Amputation of left lower extremity below knee ([place]) 08/26/2017  ? Anemia in chronic kidney disease   ? Unilateral complete BKA, left, subsequent encounter ([place])   ? Post-operative pain   ? Stage 3 chronic kidney disease ([place])   ? Hyponatremia   ? Benign essential HTN   ? Acute blood loss anemia   ? S/P BKA (below knee amputation) unilateral, left ([place]) 08/23/2017  ? Streptococcal bacteremia 08/23/2017  ? Normocytic anemia   ? Renal transplant recipient   ? 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 ([place])   ? ?PCP:  Patient, No Pcp Per (Inactive) ?Pharmacy:   ?[id number] [address] ?[place] ?[place] [address] ?Phone: [phone] Fax: [phone] ? ? ? ? ?Social Determinants of Health (SDOH) Interventions ?  ? ?Readmission Risk Interventions ?No flowsheet data found. ? ? ?

## 2021-10-22 ENCOUNTER — Telehealth: Payer: Self-pay | Admitting: Radiation Oncology

## 2021-10-22 ENCOUNTER — Inpatient Hospital Stay (HOSPITAL_COMMUNITY): Payer: Medicare Other

## 2021-10-22 ENCOUNTER — Other Ambulatory Visit: Payer: Self-pay

## 2021-10-22 DIAGNOSIS — G8929 Other chronic pain: Secondary | ICD-10-CM | POA: Diagnosis not present

## 2021-10-22 DIAGNOSIS — Z931 Gastrostomy status: Secondary | ICD-10-CM

## 2021-10-22 DIAGNOSIS — D649 Anemia, unspecified: Secondary | ICD-10-CM

## 2021-10-22 DIAGNOSIS — J392 Other diseases of pharynx: Secondary | ICD-10-CM

## 2021-10-22 DIAGNOSIS — R1310 Dysphagia, unspecified: Secondary | ICD-10-CM | POA: Diagnosis not present

## 2021-10-22 DIAGNOSIS — Z940 Kidney transplant status: Secondary | ICD-10-CM

## 2021-10-22 DIAGNOSIS — D72829 Elevated white blood cell count, unspecified: Secondary | ICD-10-CM

## 2021-10-22 DIAGNOSIS — C109 Malignant neoplasm of oropharynx, unspecified: Secondary | ICD-10-CM

## 2021-10-22 LAB — BASIC METABOLIC PANEL
Anion gap: 14 (ref 5–15)
Anion gap: 15 (ref 5–15)
Anion gap: 13 (ref 5–15)
BUN: 49 mg/dL — ABNORMAL HIGH (ref 6–20)
BUN: 55 mg/dL — ABNORMAL HIGH (ref 6–20)
BUN: 66 mg/dL — ABNORMAL HIGH (ref 6–20)
CO2: 16 mmol/L — ABNORMAL LOW (ref 22–32)
CO2: 17 mmol/L — ABNORMAL LOW (ref 22–32)
CO2: 17 mmol/L — ABNORMAL LOW (ref 22–32)
Calcium: 8.8 mg/dL — ABNORMAL LOW (ref 8.9–10.3)
Calcium: 8.9 mg/dL (ref 8.9–10.3)
Calcium: 8.6 mg/dL — ABNORMAL LOW (ref 8.9–10.3)
Chloride: 96 mmol/L — ABNORMAL LOW (ref 98–111)
Chloride: 95 mmol/L — ABNORMAL LOW (ref 98–111)
Chloride: 96 mmol/L — ABNORMAL LOW (ref 98–111)
Creatinine, Ser: 2.74 mg/dL — ABNORMAL HIGH (ref 0.61–1.24)
Creatinine, Ser: 2.97 mg/dL — ABNORMAL HIGH (ref 0.61–1.24)
Creatinine, Ser: 3.11 mg/dL — ABNORMAL HIGH (ref 0.61–1.24)
GFR, Estimated: 27 mL/min — ABNORMAL LOW (ref >60)
GFR, Estimated: 24 mL/min — ABNORMAL LOW (ref >60)
GFR, Estimated: 23 mL/min — ABNORMAL LOW (ref >60)
Glucose, Bld: 181 mg/dL — ABNORMAL HIGH (ref 70–99)
Glucose, Bld: 253 mg/dL — ABNORMAL HIGH (ref 70–99)
Glucose, Bld: 310 mg/dL — ABNORMAL HIGH (ref 70–99)
Potassium: 4.3 mmol/L (ref 3.5–5.1)
Potassium: 4.9 mmol/L (ref 3.5–5.1)
Potassium: 4.2 mmol/L (ref 3.5–5.1)
Sodium: 126 mmol/L — ABNORMAL LOW (ref 135–145)
Sodium: 127 mmol/L — ABNORMAL LOW (ref 135–145)
Sodium: 126 mmol/L — ABNORMAL LOW (ref 135–145)

## 2021-10-22 LAB — HEMOGLOBIN A1C
Hgb A1c MFr Bld: 8.2 % — ABNORMAL HIGH (ref 4.8–5.6)
Mean Plasma Glucose: 189 mg/dL

## 2021-10-22 LAB — GLUCOSE, CAPILLARY
Glucose-Capillary: 154 mg/dL — ABNORMAL HIGH (ref 70–99)
Glucose-Capillary: 155 mg/dL — ABNORMAL HIGH (ref 70–99)
Glucose-Capillary: 197 mg/dL — ABNORMAL HIGH (ref 70–99)
Glucose-Capillary: 281 mg/dL — ABNORMAL HIGH (ref 70–99)
Glucose-Capillary: 283 mg/dL — ABNORMAL HIGH (ref 70–99)
Glucose-Capillary: 288 mg/dL — ABNORMAL HIGH (ref 70–99)

## 2021-10-22 LAB — CBC WITH DIFFERENTIAL/PLATELET
Abs Immature Granulocytes: 0.1 10*3/uL — ABNORMAL HIGH (ref 0.00–0.07)
Basophils Absolute: 0 10*3/uL (ref 0.0–0.1)
Basophils Relative: 0 %
Eosinophils Absolute: 0 10*3/uL (ref 0.0–0.5)
Eosinophils Relative: 0 %
HCT: 29.8 % — ABNORMAL LOW (ref 39.0–52.0)
Hemoglobin: 9.8 g/dL — ABNORMAL LOW (ref 13.0–17.0)
Immature Granulocytes: 1 %
Lymphocytes Relative: 26 %
Lymphs Abs: 4.3 10*3/uL — ABNORMAL HIGH (ref 0.7–4.0)
MCH: 28.1 pg (ref 26.0–34.0)
MCHC: 32.9 g/dL (ref 30.0–36.0)
MCV: 85.4 fL (ref 80.0–100.0)
Monocytes Absolute: 0.1 10*3/uL (ref 0.1–1.0)
Monocytes Relative: 0 %
Neutro Abs: 12.1 10*3/uL — ABNORMAL HIGH (ref 1.7–7.7)
Neutrophils Relative %: 73 %
Platelets: 305 10*3/uL (ref 150–400)
RBC: 3.49 MIL/uL — ABNORMAL LOW (ref 4.22–5.81)
RDW: 13.8 % (ref 11.5–15.5)
WBC: 16.5 10*3/uL — ABNORMAL HIGH (ref 4.0–10.5)
nRBC: 0 % (ref 0.0–0.2)

## 2021-10-22 LAB — IRON AND TIBC
Iron: 43 ug/dL — ABNORMAL LOW (ref 45–182)
Saturation Ratios: 28 % (ref 17.9–39.5)
TIBC: 151 ug/dL — ABNORMAL LOW (ref 250–450)
UIBC: 108 ug/dL

## 2021-10-22 LAB — OSMOLALITY: Osmolality: 301 mOsm/kg — ABNORMAL HIGH (ref 275–295)

## 2021-10-22 LAB — PROTIME-INR
INR: 1.2 (ref 0.8–1.2)
Prothrombin Time: 15.1 seconds (ref 11.4–15.2)

## 2021-10-22 LAB — FERRITIN: Ferritin: 232 ng/mL (ref 24–336)

## 2021-10-22 LAB — RETICULOCYTES
Immature Retic Fract: 16 % — ABNORMAL HIGH (ref 2.3–15.9)
RBC.: 3.41 MIL/uL — ABNORMAL LOW (ref 4.22–5.81)
Retic Count, Absolute: 49.8 10*3/uL (ref 19.0–186.0)
Retic Ct Pct: 1.5 % (ref 0.4–3.1)

## 2021-10-22 LAB — APTT: aPTT: 28 seconds (ref 24–36)

## 2021-10-22 MED ORDER — GLUCAGON HCL (RDNA) 1 MG IJ SOLR
INTRAMUSCULAR | Status: AC | PRN
  Administered 2021-10-22: 1 mg via INTRAVENOUS

## 2021-10-22 MED ORDER — MIDAZOLAM HCL 2 MG/2ML IJ SOLN
INTRAMUSCULAR | Status: AC | PRN
  Administered 2021-10-22 (×2): 1 mg via INTRAVENOUS

## 2021-10-22 MED ORDER — INSULIN GLARGINE-YFGN 100 UNIT/ML ~~LOC~~ SOLN
16.0000 [IU] | Freq: Every day | SUBCUTANEOUS | Status: DC
  Administered 2021-10-22: 16 [IU] via SUBCUTANEOUS
  Filled 2021-10-22 (×2): qty 0.16

## 2021-10-22 MED ORDER — FENTANYL CITRATE (PF) 100 MCG/2ML IJ SOLN
INTRAMUSCULAR | Status: AC
  Filled 2021-10-22: qty 2

## 2021-10-22 MED ORDER — MIDAZOLAM HCL 2 MG/2ML IJ SOLN
INTRAMUSCULAR | Status: AC | PRN
  Administered 2021-10-22 (×2): 1 mg via INTRAVENOUS
  Administered 2021-10-22 (×3): .5 mg via INTRAVENOUS

## 2021-10-22 MED ORDER — FENTANYL CITRATE (PF) 100 MCG/2ML IJ SOLN
INTRAMUSCULAR | Status: AC | PRN
Start: 2021-10-22 — End: 2021-10-22
  Administered 2021-10-22 (×2): 50 ug via INTRAVENOUS

## 2021-10-22 MED ORDER — LIDOCAINE-EPINEPHRINE 1 %-1:100000 IJ SOLN
INTRAMUSCULAR | Status: AC
  Filled 2021-10-22: qty 1

## 2021-10-22 MED ORDER — HYDRALAZINE HCL 20 MG/ML IJ SOLN
10.0000 mg | INTRAMUSCULAR | Status: DC | PRN
  Filled 2021-10-22: qty 1

## 2021-10-22 MED ORDER — SODIUM CHLORIDE 0.45 % IV SOLN: INTRAVENOUS | Status: DC

## 2021-10-22 MED ORDER — DEXTROSE 5 % IV SOLN: INTRAVENOUS | Status: DC

## 2021-10-22 MED ORDER — FENTANYL CITRATE (PF) 100 MCG/2ML IJ SOLN
INTRAMUSCULAR | Status: AC | PRN
  Administered 2021-10-22 (×2): 25 ug via INTRAVENOUS
  Administered 2021-10-22: 50 ug via INTRAVENOUS
  Administered 2021-10-22: 25 ug via INTRAVENOUS
  Administered 2021-10-22: 50 ug via INTRAVENOUS

## 2021-10-22 MED ORDER — GLUCAGON HCL RDNA (DIAGNOSTIC) 1 MG IJ SOLR
INTRAMUSCULAR | Status: AC
  Filled 2021-10-22: qty 1

## 2021-10-22 MED ORDER — CEFAZOLIN SODIUM-DEXTROSE 2-4 GM/100ML-% IV SOLN
INTRAVENOUS | Status: AC
  Filled 2021-10-22: qty 100

## 2021-10-22 MED ORDER — LIDOCAINE-EPINEPHRINE 1 %-1:100000 IJ SOLN
INTRAMUSCULAR | Status: AC
  Administered 2021-10-22: 20 mL
  Filled 2021-10-22: qty 1

## 2021-10-22 MED ORDER — MIDAZOLAM HCL 2 MG/2ML IJ SOLN
INTRAMUSCULAR | Status: AC
  Filled 2021-10-22: qty 4

## 2021-10-22 MED ORDER — CEFAZOLIN SODIUM-DEXTROSE 2-4 GM/100ML-% IV SOLN
2.0000 g | Freq: Once | INTRAVENOUS | Status: AC
  Administered 2021-10-22: 2 g via INTRAVENOUS

## 2021-10-22 MED ORDER — ENOXAPARIN SODIUM 30 MG/0.3ML IJ SOSY
30.0000 mg | PREFILLED_SYRINGE | INTRAMUSCULAR | Status: DC
  Administered 2021-10-23 – 2021-10-24 (×2): 30 mg via SUBCUTANEOUS
  Filled 2021-10-22 (×2): qty 0.3

## 2021-10-22 MED ORDER — HEPARIN SOD (PORK) LOCK FLUSH 100 UNIT/ML IV SOLN
INTRAVENOUS | Status: AC
  Filled 2021-10-22: qty 5

## 2021-10-22 MED ORDER — MIDAZOLAM HCL 2 MG/2ML IJ SOLN
INTRAMUSCULAR | Status: AC
  Filled 2021-10-22: qty 2

## 2021-10-22 MED ORDER — IOHEXOL 300 MG/ML  SOLN
100.0000 mL | Freq: Once | INTRAMUSCULAR | Status: AC | PRN
  Administered 2021-10-22: 15 mL

## 2021-10-22 MED ORDER — FENTANYL CITRATE (PF) 100 MCG/2ML IJ SOLN
INTRAMUSCULAR | Status: AC
  Filled 2021-10-22: qty 4

## 2021-10-22 MED ORDER — MIDAZOLAM HCL 2 MG/2ML IJ SOLN
INTRAMUSCULAR | Status: AC
Start: 2021-10-22 — End: 2021-10-23
  Filled 2021-10-22: qty 2

## 2021-10-22 NOTE — Consult Note (Signed)
[place]  Reason for Consultation: AKI in renal transplant  Requesting Provider: Dr. Jimmye Norman   HPI: Arthur Burke is an 57 y.o. 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   Pt came in through ED yesterday with neck swelling for a few weeks that progressed to near inability to take oral intake for 48h prior to admission.  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  ENT consulted - underwent biopsy today for pathologic diagnosis.  Plans for G tube tomorrow.    On presentation Cr 2.7 up from baseline ~2-2.2, serum sodium was 117 at admission.   He rec'd isotonic volume expansion and this AM sodium 127, Cr 2.97.  No UOP recorded but he says its increased since admission.   CT a/p shows no kidney transplant pathology.  No UA or urine indices collected.    He follows with Dr. Leonides Grills at [facility] for transplant - IS pred 5 daily, cell cept 500 BID and tac 1 BID.    PMH: Past Medical History:  Diagnosis Date   Acute osteomyelitis of left calcaneus ([place])    Diabetes mellitus without complication (HCC)    Diabetic foot infection ([place])    Hypertension    Infected ulcer of skin, with fat layer exposed ([place])    Lower limb ulcer, ankle, left, with necrosis of muscle (HCC)    MRSA (methicillin resistant staph aureus) culture positive    Renal disorder    Vision impairment    PSH: Past Surgical History:  Procedure Laterality Date   AMPUTATION Left 08/23/2017   Procedure: AMPUTATION BELOW KNEE;  Surgeon: Newt Minion, MD;  Location: [place];  Service: Orthopedics;  Laterality: Left;   APPLICATION OF WOUND VAC Left 08/23/2017   Procedure: APPLICATION OF WOUND VAC;  Surgeon: Newt Minion, MD;  Location: [place];  Service: Orthopedics;  Laterality: Left;   KIDNEY TRANSPLANT  2006   [facility]   SKIN GRAFT     L  diabetic foot wound with debriedment and skin grafting   TOE AMPUTATION     R great and 2nd toe     Past Medical History:  Diagnosis Date   Acute osteomyelitis of left calcaneus ([place])    Diabetes mellitus without complication (HCC)    Diabetic foot infection ([place])    Hypertension    Infected ulcer of skin, with fat layer exposed ([place])    Lower limb ulcer, ankle, left, with necrosis of muscle (HCC)    MRSA (methicillin resistant staph aureus) culture positive    Renal disorder    Vision impairment     Medications:  I have reviewed the patient's current medications.  Medications Prior to Admission  Medication Sig Dispense Refill   amLODipine (NORVASC) 10 MG tablet Take 1 tablet (10 mg total) by mouth daily. 30 tablet 0   ascorbic acid (VITAMIN C) 500 MG tablet Take 500 mg by mouth daily.     atorvastatin (LIPITOR) 40 MG tablet Take 1 tablet (40 mg total) by mouth every evening. 30 tablet 0   Calcium 600-200 MG-UNIT tablet Take 1 tablet by mouth daily. 30 tablet 0   carvedilol (COREG) 25 MG tablet Take 1 tablet (25 mg total) by mouth 2 (two) times daily. 60 tablet 0   cloNIDine (CATAPRES) 0.1  MG tablet Take 1 tablet (0.1 mg total) by mouth 2 (two) times daily. 60 tablet 11   furosemide (LASIX) 40 MG tablet Take 40 mg by mouth 2 (two) times daily.     HUMALOG KWIKPEN 100 UNIT/ML KwikPen Inject 10 Units into the skin 3 (three) times daily.     insulin glargine (LANTUS) 100 unit/mL SOPN Inject 0.32 mLs (32 Units total) into the skin at bedtime. 15 mL 11   meloxicam (MOBIC) 15 MG tablet Take 15 mg by mouth daily as needed for pain (arthritis).     Multiple Vitamins-Minerals (VITRUM SENIOR) TABS Take 1 tablet by mouth daily.     mycophenolate (CELLCEPT) 500 MG tablet Take 500 mg by mouth 2 (two) times daily.     omeprazole (PRILOSEC) 20 MG capsule Take 1 capsule (20 mg total) by mouth 2 (two) times daily. 60 capsule 11   oxyCODONE (ROXICODONE) 15 MG immediate release tablet Take 15 mg by  mouth every 4 (four) hours.     polyethylene glycol (MIRALAX / GLYCOLAX) packet Take 17 g by mouth daily as needed for mild constipation. 14 each 0   predniSONE (DELTASONE) 5 MG tablet Take 5 mg by mouth daily.     ramipril (ALTACE) 10 MG capsule Take 2 capsules (20 mg total) by mouth daily. 30 capsule 0   sulfamethoxazole-trimethoprim (BACTRIM DS) 800-160 MG tablet Take 1 tablet by mouth 2 (two) times daily.     tacrolimus (PROGRAF) 1 MG capsule Take 1 mg by mouth 2 (two) times daily.     vitamin B-12 (CYANOCOBALAMIN) 500 MCG tablet Take 1 tablet (500 mcg total) by mouth daily. 30 tablet 0   insulin aspart (NOVOLOG) 100 UNIT/ML FlexPen Inject 10 Units into the skin 3 (three) times daily with meals. (Patient not taking: Reported on 10/21/2021) 15 mL 11   methocarbamol (ROBAXIN) 500 MG tablet Take 1 tablet (500 mg total) by mouth 2 (two) times daily as needed for muscle spasms. (Patient not taking: Reported on 10/21/2021) 60 tablet 1   ramelteon (ROZEREM) 8 MG tablet Take 1 tablet (8 mg total) by mouth at bedtime. (Patient not taking: Reported on 10/21/2021) 30 tablet 0    ALLERGIES:   Allergies  Allergen Reactions   Zolpidem Other (See Comments)    Other Reaction: CNS Disorder    FAM HX: History reviewed. No pertinent family history.  Social History:   reports that he has never smoked. He has never used smokeless tobacco. He reports that he does not drink alcohol and does not use drugs.  ROS: 10 system ROS neg except per HPI above  Blood pressure (!) 167/72, pulse 80, temperature 97.8 F (36.6 C), resp. rate 14, height '5\' 11"'$  (1.803 m), weight 72 kg, SpO2 99 %. PHYSICAL EXAM: Gen: thin chronically ill appearing man who is awake and comfortable  Eyes: anciteric, EOMI ENT: MMM Neck: fullness in neck L > R CV:  RRR Abd:  soft, LLQ kidney transplant nontender Back: clear lungs GU: no foley Extr:  L BKA, R ankle edema  1+ Neuro: aox3, fully conversant    Results for orders placed  or performed during the hospital encounter of 10/21/21 (from the past 48 hour(s))  CBC with Differential     Status: Abnormal   Collection Time: 10/21/21  4:20 AM  Result Value Ref Range   WBC 25.5 (H) 4.0 - 10.5 K/uL   RBC 3.37 (L) 4.22 - 5.81 MIL/uL   Hemoglobin 9.7 (L) 13.0 - 17.0 g/dL  HCT 28.4 (L) 39.0 - 52.0 %   MCV 84.3 80.0 - 100.0 fL   MCH 28.8 26.0 - 34.0 pg   MCHC 34.2 30.0 - 36.0 g/dL   RDW 13.6 11.5 - 15.5 %   Platelets 324 150 - 400 K/uL   nRBC 0.0 0.0 - 0.2 %   Neutrophils Relative % 72 %   Neutro Abs 18.4 (H) 1.7 - 7.7 K/uL   Lymphocytes Relative 18 %   Lymphs Abs 4.6 (H) 0.7 - 4.0 K/uL   Monocytes Relative 9 %   Monocytes Absolute 2.3 (H) 0.1 - 1.0 K/uL   Eosinophils Relative 1 %   Eosinophils Absolute 0.3 0.0 - 0.5 K/uL   Basophils Relative 0 %   Basophils Absolute 0.0 0.0 - 0.1 K/uL   WBC Morphology MORPHOLOGY UNREMARKABLE    RBC Morphology MORPHOLOGY UNREMARKABLE    Smear Review PLATELETS APPEAR ADEQUATE    Abs Immature Granulocytes 0.00 0.00 - 0.07 K/uL    Comment: Performed at [facility], [address] [place], [place] [address]  Comprehensive metabolic panel     Status: Abnormal   Collection Time: 10/21/21  4:20 AM  Result Value Ref Range   Sodium 117 (LL) 135 - 145 mmol/L    Comment: CRITICAL RESULT CALLED TO, READ BACK BY AND VERIFIED WITH: [facility]. [facility], RN [phone] 10/21/21 A [place]    Potassium 4.4 3.5 - 5.1 mmol/L   Chloride 86 (L) 98 - 111 mmol/L   CO2 20 (L) 22 - 32 mmol/L   Glucose, Bld 147 (H) 70 - 99 mg/dL    Comment: Glucose reference range applies only to samples taken after fasting for at least 8 hours.   BUN 38 (H) 6 - 20 mg/dL   Creatinine, Ser 2.72 (H) 0.61 - 1.24 mg/dL   Calcium 8.6 (L) 8.9 - 10.3 mg/dL   Total Protein 6.7 6.5 - 8.1 g/dL   Albumin 2.8 (L) 3.5 - 5.0 g/dL   AST 17 15 - 41 U/L   ALT 9 0 - 44 U/L   Alkaline Phosphatase 118 38 - 126 U/L   Total Bilirubin 0.4 0.3 - 1.2 mg/dL   GFR, Estimated 27 (L) >60 mL/min     Comment: (NOTE) Calculated using the CKD-EPI Creatinine Equation (2021)    Anion gap 11 5 - 15    Comment: Performed at [facility], [place] [address], [place], [place] [address]  HIV Antibody (routine testing w rflx)     Status: None   Collection Time: 10/21/21  9:29 AM  Result Value Ref Range   HIV Screen 4th Generation wRfx Non Reactive Non Reactive    Comment: Performed at [facility], [place] [address] [place], [place] [address]  Basic metabolic panel     Status: Abnormal   Collection Time: 10/21/21  9:29 AM  Result Value Ref Range   Sodium 120 (L) 135 - 145 mmol/L   Potassium 4.2 3.5 - 5.1 mmol/L   Chloride 90 (L) 98 - 111 mmol/L   CO2 20 (L) 22 - 32 mmol/L   Glucose, Bld 126 (H) 70 - 99 mg/dL    Comment: Glucose reference range applies only to samples taken after fasting for at least 8 hours.   BUN 37 (H) 6 - 20 mg/dL   Creatinine, Ser 2.61 (H) 0.61 - 1.24 mg/dL   Calcium 8.5 (L) 8.9 - 10.3 mg/dL   GFR, Estimated 28 (L) >60 mL/min    Comment: (  NOTE) Calculated using the CKD-EPI Creatinine Equation (2021)    Anion gap 10 5 - 15    Comment: Performed at [facility], [place] [address], [place], [place] [address]  Hepatic function panel     Status: Abnormal   Collection Time: 10/21/21  9:29 AM  Result Value Ref Range   Total Protein 6.4 (L) 6.5 - 8.1 g/dL   Albumin 2.6 (L) 3.5 - 5.0 g/dL   AST 15 15 - 41 U/L   ALT 11 0 - 44 U/L   Alkaline Phosphatase 103 38 - 126 U/L   Total Bilirubin 0.5 0.3 - 1.2 mg/dL   Bilirubin, Direct <0.1 0.0 - 0.2 mg/dL   Indirect Bilirubin NOT CALCULATED 0.3 - 0.9 mg/dL    Comment: Performed at [facility] [address], [place], [place] [address]  Lactic acid, plasma     Status: None   Collection Time: 10/21/21 10:29 AM  Result Value Ref Range   Lactic Acid, Venous 0.7 0.5 - 1.9 mmol/L    Comment: Performed at [facility] [address] [place] [address], [place], [place] [address]  CBG monitoring, ED     Status: Abnormal    Collection Time: 10/21/21  1:20 PM  Result Value Ref Range   Glucose-Capillary 124 (H) 70 - 99 mg/dL    Comment: Glucose reference range applies only to samples taken after fasting for at least 8 hours.   Comment 1 Notify RN    Comment 2 Document in Chart   Lactic acid, plasma     Status: None   Collection Time: 10/21/21  2:52 PM  Result Value Ref Range   Lactic Acid, Venous 0.8 0.5 - 1.9 mmol/L    Comment: Performed at [facility], [place] [address], [place], [place] [address]  CBG monitoring, ED     Status: Abnormal   Collection Time: 10/21/21  4:23 PM  Result Value Ref Range   Glucose-Capillary 111 (H) 70 - 99 mg/dL    Comment: Glucose reference range applies only to samples taken after fasting for at least 8 hours.   Comment 1 Notify RN    Comment 2 Document in Chart   Glucose, capillary     Status: Abnormal   Collection Time: 10/21/21  8:45 PM  Result Value Ref Range   Glucose-Capillary 131 (H) 70 - 99 mg/dL    Comment: Glucose reference range applies only to samples taken after fasting for at least 8 hours.  Basic metabolic panel     Status: Abnormal   Collection Time: 10/21/21  9:03 PM  Result Value Ref Range   Sodium 125 (L) 135 - 145 mmol/L   Potassium 4.6 3.5 - 5.1 mmol/L   Chloride 93 (L) 98 - 111 mmol/L   CO2 18 (L) 22 - 32 mmol/L   Glucose, Bld 146 (H) 70 - 99 mg/dL    Comment: Glucose reference range applies only to samples taken after fasting for at least 8 hours.   BUN 40 (H) 6 - 20 mg/dL   Creatinine, Ser 2.50 (H) 0.61 - 1.24 mg/dL   Calcium 8.9 8.9 - 10.3 mg/dL   GFR, Estimated 30 (L) >60 mL/min    Comment: (NOTE) Calculated using the CKD-EPI Creatinine Equation (2021)    Anion gap 14 5 - 15    Comment: Performed at [facility] [address], [place], [place] [address]  Glucose, capillary     Status: Abnormal   Collection  Time: 10/22/21 12:16 AM  Result Value Ref Range   Glucose-Capillary 154 (H) 70 - 99 mg/dL    Comment: Glucose reference  range applies only to samples taken after fasting for at least 8 hours.  Glucose, capillary     Status: Abnormal   Collection Time: 10/22/21  3:27 AM  Result Value Ref Range   Glucose-Capillary 155 (H) 70 - 99 mg/dL    Comment: Glucose reference range applies only to samples taken after fasting for at least 8 hours.  Basic metabolic panel     Status: Abnormal   Collection Time: 10/22/21  4:14 AM  Result Value Ref Range   Sodium 126 (L) 135 - 145 mmol/L   Potassium 4.3 3.5 - 5.1 mmol/L   Chloride 96 (L) 98 - 111 mmol/L   CO2 16 (L) 22 - 32 mmol/L   Glucose, Bld 181 (H) 70 - 99 mg/dL    Comment: Glucose reference range applies only to samples taken after fasting for at least 8 hours.   BUN 49 (H) 6 - 20 mg/dL   Creatinine, Ser 2.74 (H) 0.61 - 1.24 mg/dL   Calcium 8.8 (L) 8.9 - 10.3 mg/dL   GFR, Estimated 27 (L) >60 mL/min    Comment: (NOTE) Calculated using the CKD-EPI Creatinine Equation (2021)    Anion gap 14 5 - 15    Comment: Performed at [facility] [address] [place], [place] [address]  CBC with Differential/Platelet     Status: Abnormal   Collection Time: 10/22/21  4:14 AM  Result Value Ref Range   WBC 16.5 (H) 4.0 - 10.5 K/uL   RBC 3.49 (L) 4.22 - 5.81 MIL/uL   Hemoglobin 9.8 (L) 13.0 - 17.0 g/dL   HCT 29.8 (L) 39.0 - 52.0 %   MCV 85.4 80.0 - 100.0 fL   MCH 28.1 26.0 - 34.0 pg   MCHC 32.9 30.0 - 36.0 g/dL   RDW 13.8 11.5 - 15.5 %   Platelets 305 150 - 400 K/uL   nRBC 0.0 0.0 - 0.2 %   Neutrophils Relative % 73 %   Neutro Abs 12.1 (H) 1.7 - 7.7 K/uL   Lymphocytes Relative 26 %   Lymphs Abs 4.3 (H) 0.7 - 4.0 K/uL   Monocytes Relative 0 %   Monocytes Absolute 0.1 0.1 - 1.0 K/uL   Eosinophils Relative 0 %   Eosinophils Absolute 0.0 0.0 - 0.5 K/uL   Basophils Relative 0 %   Basophils Absolute 0.0 0.0 - 0.1 K/uL   Immature Granulocytes 1 %   Abs Immature Granulocytes 0.10 (H) 0.00 - 0.07 K/uL    Comment: Performed at [facility] [address], [place], [place] [address]  Reticulocytes     Status: Abnormal   Collection Time: 10/22/21  4:14 AM  Result Value Ref Range   Retic Ct Pct 1.5 0.4 - 3.1 %   RBC. 3.41 (L) 4.22 - 5.81 MIL/uL   Retic Count, Absolute 49.8 19.0 - 186.0 K/uL   Immature Retic Fract 16.0 (H) 2.3 - 15.9 %    Comment: Performed at [facility] [address], [place], [place] [address]  Iron and TIBC     Status: Abnormal   Collection Time: 10/22/21  4:14 AM  Result Value Ref Range   Iron 43 (L) 45 - 182 ug/dL   TIBC 151 (L) 250 - 450 ug/dL   Saturation Ratios 28 17.9 - 39.5 %   UIBC  108 ug/dL    Comment: Performed at [facility], [place] [address], [place], [place] [address]  Ferritin     Status: None   Collection Time: 10/22/21  4:14 AM  Result Value Ref Range   Ferritin 232 24 - 336 ng/mL    Comment: Performed at [facility], [place] [address], [place], [place] [address]  APTT     Status: None   Collection Time: 10/22/21  7:59 AM  Result Value Ref Range   aPTT 28 24 - 36 seconds    Comment: Performed at [facility] [address], [place], [place] [address]  Protime-INR     Status: None   Collection Time: 10/22/21  7:59 AM  Result Value Ref Range   Prothrombin Time 15.1 11.4 - 15.2 seconds   INR 1.2 0.8 - 1.2    Comment: (NOTE) INR goal varies based on device and disease states. Performed at [facility], [place] [address], [place], [place] [address]   Glucose, capillary     Status: Abnormal   Collection Time: 10/22/21  8:17 AM  Result Value Ref Range   Glucose-Capillary 197 (H) 70 - 99 mg/dL    Comment: Glucose reference range applies only to samples taken after fasting for at least 8 hours.  Basic metabolic panel     Status: Abnormal   Collection Time: 10/22/21 10:36 AM  Result Value Ref Range   Sodium 127 (L) 135 - 145 mmol/L   Potassium 4.9 3.5 - 5.1 mmol/L   Chloride 95 (L) 98 - 111 mmol/L   CO2 17 (L) 22 - 32 mmol/L   Glucose, Bld 253 (H) 70 - 99 mg/dL     Comment: Glucose reference range applies only to samples taken after fasting for at least 8 hours.   BUN 55 (H) 6 - 20 mg/dL   Creatinine, Ser 2.97 (H) 0.61 - 1.24 mg/dL   Calcium 8.9 8.9 - 10.3 mg/dL   GFR, Estimated 24 (L) >60 mL/min    Comment: (NOTE) Calculated using the CKD-EPI Creatinine Equation (2021)    Anion gap 15 5 - 15    Comment: Performed at [facility] [address] [place], [place] [address]  Glucose, capillary     Status: Abnormal   Collection Time: 10/22/21  1:44 PM  Result Value Ref Range   Glucose-Capillary 288 (H) 70 - 99 mg/dL    Comment: Glucose reference range applies only to samples taken after fasting for at least 8 hours.    CT Soft Tissue Neck Wo Contrast  Result Date: 10/21/2021 CLINICAL DATA:  Epiglottitis or tonsillitis suspected. Severe neck pain with inability to swallow. Lymph node swelling for months, slowly worsening. EXAM: CT NECK WITHOUT CONTRAST TECHNIQUE: Multidetector CT imaging of the neck was performed following the standard protocol without intravenous contrast. RADIATION DOSE REDUCTION: This exam was performed according to the departmental dose-optimization program which includes automated exposure control, adjustment of the mA and/or kV according to patient size and/or use of iterative reconstruction technique. COMPARISON:  None. FINDINGS: Pharynx and larynx: Bulky mass at the left glossotonsillar sulcus which measures 3.2 cm. Salivary glands: The submandibular glands are distorted by cervical adenopathy. No primary salivary disease suspected Thyroid: Normal Lymph nodes: Bulky bilateral jugular chain lymphadenopathy with cavitary features even by noncontrast technique. Nodal conglomerate in the left upper jugular chain measures up to 5 cm with indistinct, lobulated margins. Vascular: Limited without contrast Limited intracranial: Extensive atherosclerotic calcification  for age. Visualized orbits: Right scleral band Mastoids and visualized  paranasal sinuses: No acute finding. Right maxillary sinus atelectasis. Skeleton: No visible hematogenous metastasis. 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 Even without contrast there is probable extracapsular tumor on the left. Electronically Signed   By: Jorje Guild M.D.   On: 10/21/2021 06:38   [place] CORE BIOPSY (LYMPH NODES)  Result Date: 10/22/2021 INDICATION: Concern for head neck cancer. Please perform ultrasound-guided biopsy of dominant left supraclavicular nodal conglomeration for tissue diagnostic purposes. EXAM: ULTRASOUND-GUIDED LEFT NODAL CONGLOMERATION BIOPSY COMPARISON:  Neck CT-10/21/2021 MEDICATIONS: None ANESTHESIA/SEDATION: Moderate (conscious) sedation was employed during this procedure as administered by the Interventional Radiology RN. A total of Versed 2 mg and Fentanyl 100 mcg was administered intravenously. Moderate Sedation Time: 10 minutes. The patient's level of consciousness and vital signs were monitored continuously by radiology nursing throughout the procedure under my direct supervision. COMPLICATIONS: None immediate. TECHNIQUE: Informed written consent was obtained from the patient after a discussion of the risks, benefits and alternatives to treatment. Questions regarding the procedure were encouraged and answered. Initial ultrasound scanning demonstrated large left-sided nodal mass measuring at least 7.4 x 2.8 cm (image 3), correlating with the dominant nodal conglomeration seen on preceding neck CT image 60, series 3. An ultrasound image was saved for documentation purposes. The procedure was planned. A timeout was performed prior to the initiation of the procedure. The operative was prepped and draped in the usual sterile fashion, and a sterile drape was applied covering the operative field. A timeout was performed prior to the initiation of the procedure. Local anesthesia was provided  with 1% lidocaine with epinephrine. Under direct ultrasound guidance, an 18 gauge core needle device was utilized to obtain to obtain 6 core needle biopsies of the dominant left supraclavicular nodal conglomeration. The samples were placed in saline and submitted to pathology. The needle was removed and hemostasis was achieved with manual compression. Post procedure scan was negative for significant hematoma. A dressing was applied. The patient tolerated the procedure well without immediate postprocedural complication. IMPRESSION: Technically successful ultrasound guided biopsy of dominant left supraclavicular nodal conglomeration. Electronically Signed   By: Sandi Mariscal M.D.   On: 10/22/2021 14:37   CT CHEST ABDOMEN PELVIS WO CONTRAST  Result Date: 10/22/2021 CLINICAL DATA:  Head and neck cancer.  Staging. EXAM: CT CHEST, ABDOMEN AND PELVIS WITHOUT CONTRAST TECHNIQUE: Multidetector CT imaging of the chest, abdomen and pelvis was performed following the standard protocol without IV contrast. RADIATION DOSE REDUCTION: This exam was performed according to the departmental dose-optimization program which includes automated exposure control, adjustment of the mA and/or kV according to patient size and/or use of iterative reconstruction technique. COMPARISON:  CT neck 10/21/2021 FINDINGS: CT CHEST FINDINGS Cardiovascular: Heart size is normal. There is extensive coronary artery calcification. Aortic atherosclerotic calcification is present. Mediastinum/Nodes: No mediastinal or hilar mass or lymphadenopathy seen on this study done without contrast. Lungs/Pleura: No evidence of primary or metastatic mass or nodule. Few scattered areas of tree in bud type density within the right lung and to a lesser extent the left lung that could be scarring or indicate atypical infection such as with mycobacterium avium intracellulare. at least some of these densities were present at the lung bases in 2014, favoring inactive  scarring. No lobar consolidation or collapse. No pleural effusion. Musculoskeletal: Negative CT ABDOMEN PELVIS FINDINGS Hepatobiliary: Liver appears normal without contrast. No calcified gallstones. Pancreas: Normal Spleen:  Normal Adrenals/Urinary Tract: Adrenal glands are normal. Native kidneys show advanced atrophy. No obstruction. Renal transplant in the left iliac fossa shows normal size without focal lesion or hydronephrosis. Bladder is normal. Stomach/Bowel: Stomach and small intestine are normal. No abnormal colon finding. Vascular/Lymphatic: Aortic atherosclerosis. No aneurysm. IVC is normal. No adenopathy. Reproductive: Normal Other: None Musculoskeletal: Normal IMPRESSION: No evidence of metastatic disease. Coronary artery calcification. Aortic Atherosclerosis (ICD10-I70.0). Atrophic native kidneys. Renal transplant kidney in the left iliac fossa with normal appearance. Scattered areas of tree in bud opacification in both lungs. At least 1 area at the left lung base was present in 2014, suggesting that these findings relate to chronic pulmonary scarring. Cannot rule out low-grade atypical infection such as MAI. Electronically Signed   By: Nelson Chimes M.D.   On: 10/22/2021 12:57    Assessment/Plan **Neck/OP mass: biopsy for pathologic confirmation today - ENT, heme/onc consulted.  L  **ESRD s/p renal transplant - now with baseline CKD 4 of transplant and AKI:  suspect this is hypovolemia progressed to ATN in the setting of poor po intake.  CT doesn't show obstruction.  Sending UA, urine lytes.  Looks euvolemic now but unable to take po so will place on MIVF with 1/2NS 75/hr overnight (to prevent further rise in Na in short term).  I suspect in the coming days creatinine will improve towards baseline.  He missed a few doses of IS but doubt rejection; if failure to improve would consider renal biopsy though in the setting of likely malignancy unlikely would be candidate for aggressive immunosuppression  anyway.  Will follow.  Strict I/Os, daily labs for now.  **Immunosuppressive meds:  On IV steroids and cellcept currently; no IV alternative for tacrolimus.  D/w pharmD - will have compounding pharmacy formulate cellcept and tacrolimus to give through G tube when available to use.   **HTN: home meds include amlodipine 10, coreg 25 BID, clonidine 0.1 BID, altace 20 daily.  Unable to take PO  currently.  Hydralazine 10 IV q4h PRN SBP > 180.   **Anemia:  Hb 9.8 - follow.   Will follow - please page with concerns/questions.   Justin Mend 10/22/2021, 4:52 PM

## 2021-10-22 NOTE — Procedures (Signed)
Pre Procedure Dx: Poor venous access Post Procedural Dx: Same  Successful placement of right IJ approach port-a-cath with tip at the superior caval atrial junction. The catheter is ready for immediate use.  Estimated Blood Loss: Trace  Complications: None immediate.  Jay Cricket Goodlin, MD Pager #: [phone]   

## 2021-10-22 NOTE — Progress Notes (Addendum)
? ? ?  Dr Pascal Lux has seen and reviewed new CT performed today ?He has spoken to Dr Burr Medico today ? ?Plan is to move ahead with percutaneous gastric tube ? and ?Port a cath placement today ? ?I had consented pt for Gastric tube before LN bx this am-- ?Risks and benefits image guided gastrostomy tube placement was discussed with the patient including, but not limited to the need for a barium enema during the procedure, bleeding, infection, peritonitis and/or damage to adjacent structures. ?All of the patient's questions were answered, patient is agreeable to proceed. ? ?Pt has been consciously sedated for earlier LN Bx in IR ?I gained consent for [facility] placement with pt and brother Shanon Brow via phone ? ?Risks and benefits of image guided port-a-catheter placement was discussed with the patient and brother Shanon Brow via phone--including, but not limited to bleeding, infection, pneumothorax, or fibrin sheath development and need for additional procedures. ? ?All of the patient's questions were answered, patient is agreeable to proceed. ?Consent signed and in chart.  ? ? ? ? ?

## 2021-10-22 NOTE — Progress Notes (Addendum)
? ?Subjective: ? ?Patient states he feels about the same as yesterday. Has seen a couple of doctors but does not remember which specialties. ? ?Discussed process of getting a PEG tube and risks/benefits involved. Also discussed need for biopsy of neck mass. He is agreeable to both of these interventions.  ? ?Not having much pain in neck. Has 2 pinched nerves in back that cause pain and also has diabetic nerve pain.His brother is the only person he has and would like for [place] to contact him. Updated brother's phone number on to Epic. ? ?Objective: ? ?Vital signs in last 24 hours: ?Vitals:  ? 10/21/21 1900 10/21/21 2047 10/22/21 0445 10/22/21 0822  ?BP:  (!) 165/77 (!) 154/80 (!) 157/72  ?Pulse:  76 74 68  ?Resp:  '16 15 16  '$ ?Temp:  98.1 ?F (36.7 ?C) 98.4 ?F (36.9 ?C) 97.6 ?F (36.4 ?C)  ?TempSrc:   Oral Oral  ?SpO2:  95% 97% 94%  ?Weight: 72 kg     ?Height: '5\' 11"'$  (1.803 m)     ? ?Weight change:  ? ?Intake/Output Summary (Last 24 hours) at 10/22/2021 0949 ?Last data filed at 10/22/2021 0300 ?Gross per 24 hour  ?Intake 0 ml  ?Output --  ?Net 0 ml  ? ?Physical exam ?Constitutional: Middle aged man sitting comfortably in bed, alert and responsive, in NAD ?HEENT: normocephalic, atraumatic, non-tender, significant bilateral swelling of proximal neck ?Pulm: no increased work of breathing on room air ?Abd: soft, non-distended, with unchanged chronic pain to LLQ where kidney was transplanted ?Extremities: resolved right lower leg to shin edema, left BKA ?Skin: normal skin turgor, rash on right frontal scalp with mild erythema, non-painful to palpation ?Neuro: alert and oriented x3 ?Psych: normal affect ? ?Assessment/Plan: ?Arthur Burke a 56 yo with a pmhx significant for ESRD s/p transplant (left kidney, 2005) on Prograf and low dose prednisone 2/2 diabetic nephropathy, T1DM, HTN, left BKA. 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 ? ?Principal Problem: ?  Dehydration with hyponatremia ?Active Problems: ?  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 Patients airway remains patent but unable to take anything po. Patient was seen by ENT and oncology yesterday. 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 Both recommending biopsy of cervical node for diagnosis with routine path and HPV testing, IR to perform today. CT chest w/o contrast for staging today. Per ENT, given size of mass, surgical resection not an option. Patient will likely need chemotherapy and radiation, will be referred for both by oncology. IR consulted for PEG placement today, will obtain CT Abd/Pelvis to fully evaluate extent of condition before decision on placement tomorrow. If IR unable to place, may need surgical consult. RD will discuss cortrak placement today with patient. ?-f/u CT chest w/o contrast for staging ?-f/u CT Abd/Pelvis, PEG placement today per IR, if unable, may need surgical consult  ?-appreciate ENT and Oncology recommendations ?  ?#Hyponatremia; improving ?Patient with asymptomatic hyponatremia with Na of 117 on admission likely 2/2 hypovolemic hyponatremia in setting of poor PO intake. Today hyponatremia improved to Na of 127 with resolved right lower leg to shin edema and normal skin turgor on exam. Fluids stopped due  to exceeding goal correction of 125 by 2pts over 24hrs. Will hold for now, do not want to correct too quickly. CTM. ?- holding NS @ 100 ml /hr ?- Q12 BMP ?  ?#Pre- renal AKI ?Hx of renal failure s/p transplant ?Patient with left kidney transplant in place since 2005, followed by Dr. Susy Frizzle at [facility]  nephrology. Pre-renal AKI likely secondary to dehydration and reduced po intake over past two weeks. Patient today with continued worsening of kidney function on labs, glucose 253, BUN 55, creatinine 2.97, GFR 24. He continues with chronic pain of transplant site likely 2/2 to nerve damage, no changes to pain overnight. Will consult nephrology today given worsened kidney function on labs post correction attempt with IV fluids. ?-Patient is on tacrolimus, mycophenolate , and prednisone.  Cannot give alternative to tacrolimus while n.p.o., will start Solu-Medrol 40 mg daily until patient can take p.o. intake.  Mycophenolate converted to IV formulation . Consult placed to pharmacy for help with monitoring and dosing of immunosuppressants. ?-consulting nephrology today ?- strict I/O ?- renal ultrasound ?- urinalysis ?- serum osms ?  ?#Chronic pain ?Patient on oxycodone 15 mg every 4 hours at home, however unable to swallow pills right now ?-prn dilaudid 0.5-'1mg'$  every 3 hours ?  ?Normocytic anemia ?Patient remains asymptomatic today with stable Hgb at 9.8, slightly increased from 9.7 (3/13). Labs today are consistent with anemia of chronic disease; with normal Ferritin to 242, low Iron at 43 and TIBC at 151, and retic count % of 1.5. Will CTM. ?-daily CBC ?  ?Leukocytosis ?WBC downtrending, 16.5 today down from 25.5 (3/12). Lactic acid normal to 0.8. He continues to be afebrile and is not endorsing infectious symptoms today. Patient previously on immunosuppressive therapy Prograf and low dose prednisone for renal failure s/p transplant (left kidney, 2005), currently on Solu-Medrol and Mycophenolate. Underlying infection due to immunosuppressed state unlikely at this moment, suspect elevation most likely due to reactive process. Will continue to monitor patient for signs of infection. ?-consider blood cultures,chest xray, and  UA if clinically worsened  ?-daily CBC ?  ?T1DM ?Patient was refusing SSI overnight but has been  compliant with long acting insulin. CBGs increasing currently in 150-190s. Likely worsened to continued NPO status. Unable to place PEG until tomorrow, IR to evaluate today for placement. Continue 10 basal insulin. CBGs may be elevated in the setting of steroids, continue SSI ?-10 basal insulin SQ, SSI ?-CBG q4h ?  ?HTN ?Patient BP remains elevated to 150s/70s. He continues to be unable to take po medications, elevation likely 2/2 to this. On IV hydralazine, will restart home meds after PEG placement. ?-Hydralazine '5mg'$  q4h ? ? ? LOS: 1 day  ? ?Burke, Arthur Reichert, Medical Student ?10/22/2021, 9:49 AM  ?Attestation for Student Documentation: ? ?I personally was present and performed or re-performed the history, physical exam and medical decision-making activities of this service and have verified that the service and findings are accurately documented in the student?s note. ? ?Scarlett Presto, MD ?10/22/2021, 2:03 PM ? ?

## 2021-10-22 NOTE — Progress Notes (Signed)
MEDICATION-RELATED CONSULT NOTE  ? ?IR Procedure Consult - Anticoagulant/Antiplatelet PTA/Inpatient Med List Review by Pharmacist  ? ?Procedure: US guided biopsy of left cervical nodal mass ?   ?Completed: 10/22/21 at 12:13 ? ?Post-Procedural bleeding risk per IR MD assessment:  standard ? ?Antithrombotic medications on inpatient or PTA profile prior to procedure:   Enoxaparin 30 mg SQ q24h - last given 3/12 at 1:36pm ?  ? ?Recommended restart time per IR Post-Procedure Guidelines:   ? Post-procedure day #1 ? ?Plan:     ? Resume Enoxaparin 30 mg SQ q24h on 3/14 am. ? ?Arty Baumgartner, RPh ?10/22/2021 ?12:52 PM ? ? ?  ?

## 2021-10-22 NOTE — Telephone Encounter (Signed)
LVM to schedule consult with Dr. Squire ?

## 2021-10-22 NOTE — Consult Note (Signed)
Chief Complaint: Patient was seen in consultation today for cervical lymph node biopsy Chief Complaint  Patient presents with   Dysphagia   at the request of Dr Truitt Merle   Supervising Physician: Sandi Mariscal  Patient Status: [facility] - In-pt  History of Present Illness: Arthur Burke is a 56 y.o. 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 Even without contrast there is probable extracapsular tumor on the left.  ENT Dr Constance Holster recommending bx in IR  Pt followed by Int Med Residency  Asking for G tube also  Dr Pascal Lux has seen imaging and approves Cervical LN biopsy today He recommending CT Abd/Pelvis to fully evaluate extent of condition Will wait for CT today to determine next step--- percutaneous gastric tube etc...  Pt is aware  Past Medical History:  Diagnosis Date   Acute osteomyelitis of left calcaneus ([place])    Diabetes mellitus without complication (HCC)    Diabetic foot infection ([place])    Hypertension    Infected ulcer of skin, with fat layer exposed ([place])    Lower limb ulcer, ankle, left, with necrosis of muscle (HCC)    MRSA (methicillin resistant staph aureus) culture positive    Renal disorder    Vision impairment     Past Surgical History:  Procedure Laterality Date   AMPUTATION Left 08/23/2017   Procedure: AMPUTATION BELOW KNEE;  Surgeon: Newt Minion, MD;  Location: [place];  Service: Orthopedics;  Laterality: Left;   APPLICATION OF WOUND VAC Left 08/23/2017   Procedure: APPLICATION OF WOUND VAC;  Surgeon: Newt Minion, MD;  Location: [place];  Service: Orthopedics;  Laterality: Left;   KIDNEY TRANSPLANT  2006   [facility]   SKIN GRAFT     L diabetic foot wound with debriedment and skin grafting    TOE AMPUTATION     R great and 2nd toe    Allergies: Zolpidem  Medications: Prior to Admission medications   Medication Sig Start Date End Date Taking? Authorizing Provider  amLODipine (NORVASC) 10 MG tablet Take 1 tablet (10 mg total) by mouth daily. 09/02/17  Yes Angiulli, Lavon Paganini, PA-C  ascorbic acid (VITAMIN C) 500 MG tablet Take 500 mg by mouth daily.   Yes [provider]  atorvastatin (LIPITOR) 40 MG tablet Take 1 tablet (40 mg total) by mouth every evening. 09/02/17  Yes Angiulli, Lavon Paganini, PA-C  Calcium 600-200 MG-UNIT tablet Take 1 tablet by mouth daily. 09/02/17  Yes Angiulli, Lavon Paganini, PA-C  carvedilol (COREG) 25 MG tablet Take 1 tablet (25 mg total) by mouth 2 (two) times daily. 09/02/17  Yes Angiulli, Lavon Paganini, PA-C  cloNIDine (CATAPRES) 0.1 MG tablet Take 1 tablet (0.1 mg total) by mouth 2 (two) times daily. 09/02/17  Yes Angiulli, Lavon Paganini, PA-C  furosemide (LASIX) 40 MG tablet Take 40 mg by mouth 2 (two) times daily. 09/05/21  Yes [provider]  HUMALOG KWIKPEN 100 UNIT/ML KwikPen Inject 10 Units into the skin 3 (three) times daily. 10/16/21  Yes [provider]  insulin glargine (LANTUS) 100 unit/mL SOPN Inject 0.32 mLs (32 Units total) into the skin at bedtime. 09/02/17  Yes Angiulli, Lavon Paganini, PA-C  meloxicam (MOBIC) 15 MG tablet Take 15  mg by mouth daily as needed for pain (arthritis). 09/22/21  Yes [provider]  Multiple Vitamins-Minerals (VITRUM SENIOR) TABS Take 1 tablet by mouth daily.   Yes [provider]  mycophenolate (CELLCEPT) 500 MG tablet Take 500 mg by mouth 2 (two) times daily. 06/03/14  Yes [provider]  omeprazole (PRILOSEC) 20 MG capsule Take 1 capsule (20 mg total) by mouth 2 (two) times daily. 09/02/17 10/21/21 Yes Angiulli, Lavon Paganini, PA-C  oxyCODONE (ROXICODONE) 15 MG immediate release tablet Take 15 mg by mouth every 4 (four) hours.   Yes [provider]  polyethylene glycol (MIRALAX /  GLYCOLAX) packet Take 17 g by mouth daily as needed for mild constipation. 08/26/17  Yes Santos-Sanchez, Merlene Morse, MD  predniSONE (DELTASONE) 5 MG tablet Take 5 mg by mouth daily. 06/24/17  Yes [provider]  ramipril (ALTACE) 10 MG capsule Take 2 capsules (20 mg total) by mouth daily. 09/02/17  Yes Angiulli, Lavon Paganini, PA-C  sulfamethoxazole-trimethoprim (BACTRIM DS) 800-160 MG tablet Take 1 tablet by mouth 2 (two) times daily. 10/14/21  Yes [provider]  tacrolimus (PROGRAF) 1 MG capsule Take 1 mg by mouth 2 (two) times daily. 06/27/17  Yes [provider]  vitamin B-12 (CYANOCOBALAMIN) 500 MCG tablet Take 1 tablet (500 mcg total) by mouth daily. 09/02/17  Yes Angiulli, Lavon Paganini, PA-C  insulin aspart (NOVOLOG) 100 UNIT/ML FlexPen Inject 10 Units into the skin 3 (three) times daily with meals. Patient not taking: Reported on 10/21/2021 09/02/17   Angiulli, Lavon Paganini, PA-C  methocarbamol (ROBAXIN) 500 MG tablet Take 1 tablet (500 mg total) by mouth 2 (two) times daily as needed for muscle spasms. Patient not taking: Reported on 10/21/2021 11/07/17   Jamse Arn, MD  ramelteon (ROZEREM) 8 MG tablet Take 1 tablet (8 mg total) by mouth at bedtime. Patient not taking: Reported on 10/21/2021 09/02/17   [place], Lavon Paganini, PA-C     History reviewed. No pertinent family history.  Social History   Socioeconomic History   Marital status: Divorced    Spouse name: Not on file   Number of children: Not on file   Years of education: Not on file   Highest education level: Not on file  Occupational History   Not on file  Tobacco Use   Smoking status: Never   Smokeless tobacco: Never  Vaping Use   Vaping Use: Former  Substance and Sexual Activity   Alcohol use: No   Drug use: No   Sexual activity: Not on file  Other Topics Concern   Not on file  Social History Narrative   Pt plans to be discharge home with mother    Social Determinants of Health   Financial Resource  Strain: Not on file  Food Insecurity: Not on file  Transportation Needs: Not on file  Physical Activity: Not on file  Stress: Not on file  Social Connections: Not on file    Review of Systems: A 12 point ROS discussed and pertinent positives are indicated in the HPI above.  All other systems are negative.  Review of Systems  Constitutional:  Negative for activity change, fatigue and fever.  HENT:  Positive for trouble swallowing.   Respiratory:  Negative for cough and shortness of breath.   Cardiovascular:  Negative for chest pain.  Gastrointestinal:  Negative for abdominal pain, nausea and vomiting.  Psychiatric/Behavioral:  Negative for behavioral problems and confusion.    Vital Signs: BP (!) 157/72 (BP Location: Right Arm)  Pulse 68    Temp 97.6 F (36.4 C) (Oral)    Resp 16    Ht '5\' 11"'$  (1.803 m)    Wt 158 lb 11.7 oz (72 kg)    SpO2 94%    BMI 22.14 kg/m   Physical Exam Vitals reviewed.  HENT:     Mouth/Throat:     Mouth: Mucous membranes are moist.  Neck:     Comments: Bilateral swelling +Lymphadenopathy Cardiovascular:     Rate and Rhythm: Normal rate and regular rhythm.     Heart sounds: Normal heart sounds.  Pulmonary:     Effort: Pulmonary effort is normal.     Breath sounds: Normal breath sounds.  Abdominal:     Palpations: Abdomen is soft.     Tenderness: There is no abdominal tenderness.  Musculoskeletal:        General: Normal range of motion.     Comments: L BKA  Skin:    General: Skin is warm.  Neurological:     Mental Status: He is alert and oriented to person, place, and time.  Psychiatric:        Behavior: Behavior normal.    Imaging: CT Soft Tissue Neck Wo Contrast  Result Date: 10/21/2021 CLINICAL DATA:  Epiglottitis or tonsillitis suspected. Severe neck pain with inability to swallow. Lymph node swelling for months, slowly worsening. EXAM: CT NECK WITHOUT CONTRAST TECHNIQUE: Multidetector CT imaging of the neck was performed following the  standard protocol without intravenous contrast. RADIATION DOSE REDUCTION: This exam was performed according to the departmental dose-optimization program which includes automated exposure control, adjustment of the mA and/or kV according to patient size and/or use of iterative reconstruction technique. COMPARISON:  None. FINDINGS: Pharynx and larynx: Bulky mass at the left glossotonsillar sulcus which measures 3.2 cm. Salivary glands: The submandibular glands are distorted by cervical adenopathy. No primary salivary disease suspected Thyroid: Normal Lymph nodes: Bulky bilateral jugular chain lymphadenopathy with cavitary features even by noncontrast technique. Nodal conglomerate in the left upper jugular chain measures up to 5 cm with indistinct, lobulated margins. Vascular: Limited without contrast Limited intracranial: Extensive atherosclerotic calcification for age. Visualized orbits: Right scleral band Mastoids and visualized paranasal sinuses: No acute finding. Right maxillary sinus atelectasis. Skeleton: No visible hematogenous metastasis. 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 Even without contrast there is probable extracapsular tumor on the left. Electronically Signed   By: Jorje Guild M.D.   On: 10/21/2021 06:38    Labs:  CBC: Recent Labs    10/21/21 0420 10/22/21 0414  WBC 25.5* 16.5*  HGB 9.7* 9.8*  HCT 28.4* 29.8*  PLT 324 305    COAGS: Recent Labs    10/22/21 0759  INR 1.2  APTT 28    BMP: Recent Labs    10/21/21 0420 10/21/21 0929 10/21/21 2103 10/22/21 0414  NA 117* 120* 125* 126*  K 4.4 4.2 4.6 4.3  CL 86* 90* 93* 96*  CO2 20* 20* 18* 16*  GLUCOSE 147* 126* 146* 181*  BUN 38* 37* 40* 49*  CALCIUM 8.6* 8.5* 8.9 8.8*  CREATININE 2.72* 2.61* 2.50* 2.74*  GFRNONAA 27* 28* 30* 27*    LIVER FUNCTION TESTS: Recent Labs    10/21/21 0420 10/21/21 0929  BILITOT 0.4 0.5   AST 17 15  ALT 9 11  ALKPHOS 118 103  PROT 6.7 6.4*  ALBUMIN 2.8* 2.6*    TUMOR MARKERS: No results for input(s): AFPTM,  CEA, CA199, CHROMGRNA in the last 8760 hours.  Assessment and Plan:  Dysphagia- Bilateral cervical Lymphadenopathy Scheduled for Cervical lymph node biopsy in IR today Scheduled also for CT Abd/Pelvis--- will await results and determine needs Scheduled for possible percutaneous gastric tube placement 3/14  Risks and benefits of cervical Lymph node biopsy was discussed with the patient and/or patient's family including, but not limited to bleeding, infection, damage to adjacent structures or low yield requiring additional tests. All of the questions were answered and there is agreement to proceed.  Risks and benefits image guided gastrostomy tube placement was discussed with the patient including, but not limited to the need for a barium enema during the procedure, bleeding, infection, peritonitis and/or damage to adjacent structures. All of the patient's questions were answered, patient is agreeable to proceed.  Consents signed and in chart.   Thank you for this interesting consult.  I greatly enjoyed meeting Arthur Burke and look forward to participating in their care.  A copy of this report was sent to the requesting provider on this date.  Electronically Signed: Lavonia Drafts, PA-C 10/22/2021, 10:11 AM   I spent a total of 40 Minutes    in face to face in clinical consultation, greater than 50% of which was counseling/coordinating care for cervical lymph node bx

## 2021-10-22 NOTE — Progress Notes (Signed)
Pt seen in room ,alert/oriented no apparent distresss. Surgical site post biopsy CDI. Pt has no complaints. ?

## 2021-10-22 NOTE — Progress Notes (Signed)
Cortrak Tube Team Note: ? ?Consult received to place a Cortrak feeding tube.  ? ?Pt refusing Cortrak tube placement; pt would like to just wait and have G-tube placed. RD discussed with pt the importance of having access to be able to give nutrition and medications. Discussed at length the process of cortrak tube placement and what to expect. Pt is aware of the risks and chooses to wait for G-tube placement.  ? ?Koleen Distance MS, RD, LDN ?Please refer to [facility] for RD and/or RD on-call/weekend/after hours pager ? ? ?

## 2021-10-22 NOTE — Procedures (Signed)
Pre procedure Dx: Dysphagia Post Procedure Dx: Same  Successful fluoroscopic guided insertion of gastrostomy tube.   The gastrostomy tube may be used immediately for medications.   Tube feeds may be initiated in 24 hours as per the primary team.    EBL: Trace  Complications: None immediate  Jay Rayshaun Needle, MD Pager #: [phone]     

## 2021-10-22 NOTE — Procedures (Signed)
Pre Procedure Dx: Concern for head and neck cancer ?Post Procedural Dx: Same ? ?Technically successful US guided biopsy of indeterminate left cervical nodal mass.  ? ?EBL: None ? ?No immediate complications.  ? ?Ronny Bacon, MD ?Pager #: [phone] ? ? ?

## 2021-10-23 ENCOUNTER — Telehealth: Payer: Self-pay | Admitting: Radiation Oncology

## 2021-10-23 ENCOUNTER — Inpatient Hospital Stay (HOSPITAL_COMMUNITY): Payer: Medicare Other

## 2021-10-23 ENCOUNTER — Other Ambulatory Visit: Payer: Self-pay

## 2021-10-23 DIAGNOSIS — J392 Other diseases of pharynx: Secondary | ICD-10-CM | POA: Diagnosis not present

## 2021-10-23 DIAGNOSIS — Z931 Gastrostomy status: Secondary | ICD-10-CM | POA: Diagnosis not present

## 2021-10-23 DIAGNOSIS — E860 Dehydration: Secondary | ICD-10-CM | POA: Diagnosis not present

## 2021-10-23 DIAGNOSIS — C109 Malignant neoplasm of oropharynx, unspecified: Secondary | ICD-10-CM

## 2021-10-23 DIAGNOSIS — R1310 Dysphagia, unspecified: Secondary | ICD-10-CM | POA: Diagnosis not present

## 2021-10-23 DIAGNOSIS — G8929 Other chronic pain: Secondary | ICD-10-CM | POA: Diagnosis not present

## 2021-10-23 DIAGNOSIS — E871 Hypo-osmolality and hyponatremia: Secondary | ICD-10-CM | POA: Diagnosis not present

## 2021-10-23 LAB — CBC
HCT: 28 % — ABNORMAL LOW (ref 39.0–52.0)
Hemoglobin: 9.7 g/dL — ABNORMAL LOW (ref 13.0–17.0)
MCH: 29 pg (ref 26.0–34.0)
MCHC: 34.6 g/dL (ref 30.0–36.0)
MCV: 83.6 fL (ref 80.0–100.0)
Platelets: 320 10*3/uL (ref 150–400)
RBC: 3.35 MIL/uL — ABNORMAL LOW (ref 4.22–5.81)
RDW: 13.8 % (ref 11.5–15.5)
WBC: 26.3 10*3/uL — ABNORMAL HIGH (ref 4.0–10.5)
nRBC: 0 % (ref 0.0–0.2)

## 2021-10-23 LAB — GLUCOSE, CAPILLARY
Glucose-Capillary: 100 mg/dL — ABNORMAL HIGH (ref 70–99)
Glucose-Capillary: 116 mg/dL — ABNORMAL HIGH (ref 70–99)
Glucose-Capillary: 157 mg/dL — ABNORMAL HIGH (ref 70–99)
Glucose-Capillary: 183 mg/dL — ABNORMAL HIGH (ref 70–99)
Glucose-Capillary: 219 mg/dL — ABNORMAL HIGH (ref 70–99)
Glucose-Capillary: 229 mg/dL — ABNORMAL HIGH (ref 70–99)
Glucose-Capillary: 299 mg/dL — ABNORMAL HIGH (ref 70–99)
Glucose-Capillary: 41 mg/dL — CL (ref 70–99)

## 2021-10-23 LAB — URINALYSIS, ROUTINE W REFLEX MICROSCOPIC
Bilirubin Urine: NEGATIVE
Glucose, UA: NEGATIVE mg/dL
Hgb urine dipstick: NEGATIVE
Ketones, ur: 5 mg/dL — AB
Leukocytes,Ua: NEGATIVE
Nitrite: NEGATIVE
Protein, ur: 100 mg/dL — AB
Specific Gravity, Urine: 1.009 (ref 1.005–1.030)
pH: 5 (ref 5.0–8.0)

## 2021-10-23 LAB — PHOSPHORUS: Phosphorus: 3.8 mg/dL (ref 2.5–4.6)

## 2021-10-23 LAB — BASIC METABOLIC PANEL
Anion gap: 10 (ref 5–15)
BUN: 67 mg/dL — ABNORMAL HIGH (ref 6–20)
CO2: 21 mmol/L — ABNORMAL LOW (ref 22–32)
Calcium: 8.8 mg/dL — ABNORMAL LOW (ref 8.9–10.3)
Chloride: 99 mmol/L (ref 98–111)
Creatinine, Ser: 2.55 mg/dL — ABNORMAL HIGH (ref 0.61–1.24)
GFR, Estimated: 29 mL/min — ABNORMAL LOW (ref >60)
Glucose, Bld: 66 mg/dL — ABNORMAL LOW (ref 70–99)
Potassium: 3.8 mmol/L (ref 3.5–5.1)
Sodium: 130 mmol/L — ABNORMAL LOW (ref 135–145)

## 2021-10-23 LAB — MAGNESIUM: Magnesium: 2.2 mg/dL (ref 1.7–2.4)

## 2021-10-23 LAB — SODIUM, URINE, RANDOM: Sodium, Ur: 30 mmol/L

## 2021-10-23 LAB — CREATININE, URINE, RANDOM: Creatinine, Urine: 67.61 mg/dL

## 2021-10-23 MED ORDER — HYDROMORPHONE HCL 1 MG/ML IJ SOLN
0.5000 mg | Freq: Once | INTRAMUSCULAR | Status: AC
  Administered 2021-10-23: 0.5 mg via INTRAVENOUS
  Filled 2021-10-23: qty 0.5

## 2021-10-23 MED ORDER — INSULIN GLARGINE-YFGN 100 UNIT/ML ~~LOC~~ SOLN
12.0000 [IU] | Freq: Every day | SUBCUTANEOUS | Status: DC
  Administered 2021-10-23: 12 [IU] via SUBCUTANEOUS
  Filled 2021-10-23 (×2): qty 0.12

## 2021-10-23 MED ORDER — SENNOSIDES 8.8 MG/5ML PO SYRP
5.0000 mL | ORAL_SOLUTION | Freq: Two times a day (BID) | ORAL | Status: DC
  Filled 2021-10-23: qty 5

## 2021-10-23 MED ORDER — TACROLIMUS 1 MG/ML ORAL SUSPENSION
1.0000 mg | Freq: Two times a day (BID) | ORAL | Status: DC
  Administered 2021-10-23 – 2021-10-29 (×12): 1 mg
  Filled 2021-10-23 (×12): qty 1

## 2021-10-23 MED ORDER — HYDROMORPHONE HCL 1 MG/ML IJ SOLN
0.5000 mg | INTRAMUSCULAR | Status: DC | PRN
  Administered 2021-10-23 – 2021-10-25 (×7): 1 mg via INTRAVENOUS
  Administered 2021-10-25: 0.5 mg via INTRAVENOUS
  Administered 2021-10-25 – 2021-10-30 (×8): 1 mg via INTRAVENOUS
  Filled 2021-10-23 (×17): qty 1

## 2021-10-23 MED ORDER — HYDRALAZINE HCL 20 MG/ML IJ SOLN: 5.0000 mg | INTRAMUSCULAR | Status: DC | PRN

## 2021-10-23 MED ORDER — ATORVASTATIN CALCIUM 40 MG PO TABS
40.0000 mg | ORAL_TABLET | Freq: Every evening | ORAL | Status: DC
  Administered 2021-10-23 – 2021-10-27 (×5): 40 mg
  Filled 2021-10-23 (×5): qty 1

## 2021-10-23 MED ORDER — FUROSEMIDE 40 MG PO TABS
40.0000 mg | ORAL_TABLET | Freq: Two times a day (BID) | ORAL | Status: DC
  Administered 2021-10-23 – 2021-10-28 (×10): 40 mg
  Filled 2021-10-23 (×10): qty 1

## 2021-10-23 MED ORDER — OSMOLITE 1.5 CAL PO LIQD
1000.0000 mL | ORAL | Status: DC
Start: 2021-10-23 — End: 2021-10-24
  Administered 2021-10-23: 1000 mL
  Filled 2021-10-23: qty 1000

## 2021-10-23 MED ORDER — DEXTROSE-NACL 5-0.45 % IV SOLN: INTRAVENOUS | Status: DC

## 2021-10-23 MED ORDER — MYCOPHENOLATE 200 MG/ML ORAL SUSPENSION
500.0000 mg | Freq: Two times a day (BID) | ORAL | Status: DC
  Administered 2021-10-23 – 2021-10-30 (×14): 500 mg
  Filled 2021-10-23 (×16): qty 10

## 2021-10-23 MED ORDER — RAMIPRIL 5 MG PO CAPS
20.0000 mg | ORAL_CAPSULE | Freq: Every day | ORAL | Status: DC
  Administered 2021-10-23 – 2021-10-28 (×6): 20 mg
  Filled 2021-10-23 (×7): qty 4

## 2021-10-23 MED ORDER — OXYCODONE HCL 5 MG PO TABS
15.0000 mg | ORAL_TABLET | ORAL | Status: DC
  Administered 2021-10-23 – 2021-10-28 (×30): 15 mg
  Filled 2021-10-23 (×30): qty 3

## 2021-10-23 MED ORDER — AMLODIPINE BESYLATE 10 MG PO TABS
10.0000 mg | ORAL_TABLET | Freq: Every day | ORAL | Status: DC
  Administered 2021-10-23 – 2021-10-28 (×6): 10 mg
  Filled 2021-10-23 (×6): qty 1

## 2021-10-23 MED ORDER — DEXTROSE 50 % IV SOLN
25.0000 g | INTRAVENOUS | Status: AC
  Administered 2021-10-23: 25 g via INTRAVENOUS

## 2021-10-23 MED ORDER — INSULIN ASPART 100 UNIT/ML IJ SOLN
0.0000 [IU] | INTRAMUSCULAR | Status: DC
  Administered 2021-10-23: 2 [IU] via SUBCUTANEOUS
  Administered 2021-10-23: 5 [IU] via SUBCUTANEOUS
  Administered 2021-10-23: 3 [IU] via SUBCUTANEOUS
  Administered 2021-10-24: 9 [IU] via SUBCUTANEOUS

## 2021-10-23 MED ORDER — CARVEDILOL 25 MG PO TABS
25.0000 mg | ORAL_TABLET | Freq: Two times a day (BID) | ORAL | Status: DC
  Administered 2021-10-23 – 2021-10-28 (×11): 25 mg
  Filled 2021-10-23 (×11): qty 1

## 2021-10-23 MED ORDER — DOCUSATE SODIUM 50 MG/5ML PO LIQD
100.0000 mg | Freq: Two times a day (BID) | ORAL | Status: DC
  Administered 2021-10-23 – 2021-10-28 (×8): 100 mg
  Filled 2021-10-23 (×8): qty 10

## 2021-10-23 NOTE — Progress Notes (Signed)
Inpatient Diabetes Program Recommendations ? ?AACE/ADA: New Consensus Statement on Inpatient Glycemic Control (2015) ? ?Target Ranges:  Prepandial:   less than 140 mg/dL ?     Peak postprandial:   less than 180 mg/dL (1-2 hours) ?     Critically ill patients:  140 - 180 mg/dL  ? ?Lab Results  ?Component Value Date  ? GLUCAP 157 (H) 10/23/2021  ? HGBA1C 8.2 (H) 10/21/2021  ? ? ?Review of Glycemic Control ? Latest Reference Range & Units 10/22/21 08:17 10/22/21 13:44 10/22/21 17:11 10/22/21 19:43 10/23/21 00:10 10/23/21 03:55 10/23/21 07:46 10/23/21 08:13  ?Glucose-Capillary 70 - 99 mg/dL 197 (H) 288 (H) 283 (H) 281 (H) 229 (H) 116 (H) 41 (LL) 157 (H)  ?(LL): Data is critically low ?(H): Data is abnormally high ? ?Diabetes history: DM1 ?Outpatient Diabetes medications: Lantus 32 units q hs, Not taking Humalog 10 units tid ?Current orders for Inpatient glycemic control: Semglee 12 units qhs, Novolog 0-9 units q 4 hrs. ? ?Inpatient Diabetes Program Recommendations:   ?Received consult regarding glycemic management. ?Noted patient had hypoglycemia post correction and Semglee decreased to 12 units q hs. ? ?Please consider: ?-Decrease Novolog correction while NPO to 0-6 units q 4 hrs. ?Secure chat sent to Dr. Jimmye Norman. ? ?Thank you, ?Nani Gasser Joyia Riehle, RN, MSN, CDE  ?Diabetes Coordinator ?Inpatient Glycemic Control Team ?Team Pager [phone] (8am-5pm) ?10/23/2021 10:28 AM ? ? ? ? ? ?

## 2021-10-23 NOTE — Progress Notes (Addendum)
? ?Subjective: ? ?PEG placed by IR yesterday. Is doing well but endorsing some pain from G-tube site, but port site feels fine. Discussed plan for TF later on today (around 5PM).  ? ?Worried about low sugar this morning. Discussed this should improve with tube feeds. Says had some looser stools yesterday, unsure if this is due to antibiotics received during procedure yesterday. Would like something if stools become more loose. ? ?Objective: ? ?Vital signs in last 24 hours: ?Vitals:  ? 10/23/21 0012 10/23/21 0354 10/23/21 0749 10/23/21 0816  ?BP: (!) 160/77 (!) 162/73 (!) 184/78 (!) 173/73  ?Pulse: 69 62 61 62  ?Resp: '18 16 16   '$ ?Temp: 98.2 ?F (36.8 ?C) 97.7 ?F (36.5 ?C) 98.3 ?F (36.8 ?C)   ?TempSrc:      ?SpO2: 98% 98% 97% (!) 84%  ?Weight:      ?Height:      ? ?Weight change:  ? ?Intake/Output Summary (Last 24 hours) at 10/23/2021 1109 ?Last data filed at 10/22/2021 1800 ?Gross per 24 hour  ?Intake 100.24 ml  ?Output --  ?Net 100.24 ml  ? ?Physical Exam: ?Constitutional: middle aged man sitting comfortably in bed, alert and responsive, NAD ?HEENT: normocephalic, atraumatic, unchanged non-tender, significant bilateral swelling of proximal neck, airway is patent, rash present on right frontal scalp, mild erythema, ?Cardio: normal rate and rhythm, systolic murmur heard over P, M, T regions  ?Pulm: no increased work of breathing on room air ?Abd:non-distended, non-tender ?Extremities: BKA of right foot, AVF L arm  ?Skin: port-a-cath at right IJ, erythema localized to edge of tape covering dressing at port site, no pain to palpation of site; PEG tube at left lower abdomen, covered with dressing during visit, moderate pain to region, no surrounding erythema.  ?Neuro: alert and oriented x3 ?Psych: normal affect ? ?Assessment/Plan: ?Mr. 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 ? ?Principal Problem: ?  Dehydration with hyponatremia ?Active Problems: ?  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 His airway remains patent but remains unable to take anything po. IR successfully placed PEG tube and port-a-cath yesterday. PEG ready for immediate use for medication administration, will begin tube feeds this evening, 24hrs post tube placement. Biopsy of cervical node obtained yesterday. CT chest showing no evidence of metastatic disease.  ?Once medically stable, Oncology will refer him to Dr. Isidore Moos for radiation and Dr. Chryl Heck for chemo treatment. Will be seen today by dietician and diabetes coordinator for eduction on continued nutrition given new PEG tube. Patient with moderate pain to PEG tube site today, will give additional dilaudid 0.'5mg'$  for pain relief. OT/PT to see patient today to assess mobility. ?-dilaudid 0.5-'1mg'$  prn for pain relief ?- PT/OT consults ?-appreciate ENT and Oncology recommendations ? ?#Dysphagia ?#Nutrition via tube feeds ?Patient just had PEG tube placed, nutrition to see patient to help with teaching and determine tube feed needs. ?-f/u recs from registered dietician, diabetes coordinator,  ? ?#Hyponatremia; improving ?Patient with asymptomatic hyponatremia with Na of 117 on admission likely 2/2 hypovolemic hyponatremia in setting of poor PO intake. Today hyponatremia improved to Na of 130. Patient remains euvolemic on exam today. On D5 1/2  NS at 35m/hr for hypoglycemic episode. Continued monitoring, do not want to correct too quickly.  ?- D5 1/2 NS at 757mhr ?  ?#Pre- renal AKI ?Hx of renal failure s/p transplant ?Patient with left kidney transplant in place since 2005, [facility] Pre-renal AKI likely secondary to dehydration and reduced po intake over past two weeks. Renal function has started to improve, suspect ATN may be the cause of AKI which is now improving. [place] increased parenchymal echogenicity which could indicate medical renal disease, will need close monitoring as an outpatient however suspect patient is returning back to baseline.  ?-Patient is on tacrolimus, mycophenolate , and prednisone.  Cannot give alternative to tacrolimus while n.p.o., will start Solu-Medrol 40 mg daily until patient can take p.o. intake.  Mycophenolate converted to IV formulation . -Consult placed to pharmacy for help with monitoring and dosing of immunosuppressants; switch to PO meds today per tube ?-appreciate continued nephrology recs ?-daily BMP ?- strict I/O ?-avoid nephrotoxic medicaations ?  ?#Chronic pain ?Patient on oxycodone 15 mg every 4 hours at home, however unable to swallow pills right now. Pain slightly increased after yesterday's procedures. Increased pain meds as above. ?-prn dilaudid 0.5-'1mg'$  every 3 hours ?  ?Normocytic anemia, stable ?Normocytic anemia of chronic disease. Patient remains asymptomatic today with stable Hgb at 9.7, No signs of active bleeding. ?-daily CBC ?  ?Leukocytosis ?WBC increased today to 26.3 up from 16.5 (3/13), however this is in the setting of both solumedrol as above as well as possible reactive processes from recent procedures. Patient is afebrile and with no active signs of infection at this time. However given immunosuppressive therapy, will CTM for underlying infection, continues to be unlikely at this moment. ?-consider blood cultures,chest xray, and UA if clinically worsened  ?-daily CBC ?  ?T1DM ?Patient had hypoglycemic episode this AM with CBG of 41. Likely due to continued NPO status. Patient started on D5 1/2 NS at 759mr, will continue until able to initiate tube feeds this evening, 24hrs post tube placement.  Subsequent CBGs in 100-150s. Will be seen today by dietician and diabetes coordinator for eduction on continued nutrition and diabetes management given new PEG tube. Decrease Novolog correction while NPO to 0-6 units q 4 hrs, per diabetes coordinator. ?-Basal insulin (Semglee) 12 units qhs, Novolog 0-6 units q 4 hrs ?-CBG q4h ?  ?HTN ?Patient BP remains elevated to 170-180s/70s. He continues to be unable to take po medications, elevation likely 2/2 to this. On IV hydralazine, will  restart oral meds tonight given PEG placement. ?-Hydralazine '5mg'$  q4h, PRN SBP >180 ? ?#Bilateral tree bud opacities of lungs ?CT chest significant for scattered areas of tree bud opacification in both lungs. At least 1 area at the left lung base was present in 2014, suggesting findings relate to chronic pulmonary scarring, unable to rule out low-grade atypical infection. Patient with WBC increased today to 26.3 up from 16.5 (3/13). He continues to be afebrile and is not endorsing infectious symptoms today. Given immunosuppressive therapy, will CTM for underlying infection, continues to be unlikely at this moment.  ? ? ? LOS: 2 days  ? ?Rodriguez-Teodoro, GemQuintella Reichertedical Student ?10/23/2021, 11:09 AM  ? ?Attestation for Student Documentation: ? ?I personally was present and performed or re-performed the history, physical exam and medical decision-making activities of this service and have verified that the service and findings are accurately documented in the student?s note. ? ?DemScarlett PrestoD ?10/23/2021, 2:11 PM ? ?

## 2021-10-23 NOTE — Progress Notes (Signed)
Hypoglycemic Event ? ?CBG: 41 at 0746 ? ?Treatment: D50 50 mL (25 gm) ? ?Symptoms: None ? ?Follow-up CBG: [id number] CBG Result:157 ? ?Possible Reasons for Event: Other: Patient NPO, will be started on tube feedings this evening.  ? ?Comments/MD notified: Internal medicine at bedside. MIVF with D5 started on patient, till tube feedings can be started.  ? ? ? ?Arthur Burke Arjay Jaskiewicz ? ? ?

## 2021-10-23 NOTE — Progress Notes (Signed)
? ? ?Referring Physician(s): ?Ky Barban ? ?Supervising Physician: Arne Cleveland ? ?Patient Status:  [facility] - In-pt ? ?Chief Complaint: ? ?Dysphagia ?Oropharyngeal cancer ? ?Subjective: ? ?Percutaneous Gastric tube placed in IR 3/13 ?Doing well ?No complaints ? ?Allergies: ?Zolpidem ? ?Medications: ?Prior to Admission medications   ?Medication Sig Start Date End Date Taking? Authorizing Provider  ?amLODipine (NORVASC) 10 MG tablet Take 1 tablet (10 mg total) by mouth daily. 09/02/17  Yes Angiulli, Lavon Paganini, PA-C  ?ascorbic acid (VITAMIN C) 500 MG tablet Take 500 mg by mouth daily.   Yes [provider]  ?atorvastatin (LIPITOR) 40 MG tablet Take 1 tablet (40 mg total) by mouth every evening. 09/02/17  Yes Angiulli, Lavon Paganini, PA-C  ?Calcium 600-200 MG-UNIT tablet Take 1 tablet by mouth daily. 09/02/17  Yes Angiulli, Lavon Paganini, PA-C  ?carvedilol (COREG) 25 MG tablet Take 1 tablet (25 mg total) by mouth 2 (two) times daily. 09/02/17  Yes Angiulli, Lavon Paganini, PA-C  ?cloNIDine (CATAPRES) 0.1 MG tablet Take 1 tablet (0.1 mg total) by mouth 2 (two) times daily. 09/02/17  Yes Angiulli, Lavon Paganini, PA-C  ?furosemide (LASIX) 40 MG tablet Take 40 mg by mouth 2 (two) times daily. 09/05/21  Yes [provider]  ?HUMALOG KWIKPEN 100 UNIT/ML KwikPen Inject 10 Units into the skin 3 (three) times daily. 10/16/21  Yes [provider]  ?insulin glargine (LANTUS) 100 unit/mL SOPN Inject 0.32 mLs (32 Units total) into the skin at bedtime. 09/02/17  Yes Angiulli, Lavon Paganini, PA-C  ?meloxicam (MOBIC) 15 MG tablet Take 15 mg by mouth daily as needed for pain (arthritis). 09/22/21  Yes [provider]  ?Multiple Vitamins-Minerals (VITRUM SENIOR) TABS Take 1 tablet by mouth daily.   Yes [provider]  ?mycophenolate (CELLCEPT) 500 MG tablet Take 500 mg by mouth 2 (two) times daily. 06/03/14  Yes [provider]  ?omeprazole (PRILOSEC) 20 MG capsule Take 1 capsule (20 mg total) by mouth 2 (two) times daily.  09/02/17 10/21/21 Yes Angiulli, Lavon Paganini, PA-C  ?oxyCODONE (ROXICODONE) 15 MG immediate release tablet Take 15 mg by mouth every 4 (four) hours.   Yes [provider]  ?polyethylene glycol (MIRALAX / GLYCOLAX) packet Take 17 g by mouth daily as needed for mild constipation. 08/26/17  Yes Santos-Sanchez, Merlene Morse, MD  ?predniSONE (DELTASONE) 5 MG tablet Take 5 mg by mouth daily. 06/24/17  Yes [provider]  ?ramipril (ALTACE) 10 MG capsule Take 2 capsules (20 mg total) by mouth daily. 09/02/17  Yes Angiulli, Lavon Paganini, PA-C  ?sulfamethoxazole-trimethoprim (BACTRIM DS) 800-160 MG tablet Take 1 tablet by mouth 2 (two) times daily. 10/14/21  Yes [provider]  ?tacrolimus (PROGRAF) 1 MG capsule Take 1 mg by mouth 2 (two) times daily. 06/27/17  Yes [provider]  ?vitamin B-12 (CYANOCOBALAMIN) 500 MCG tablet Take 1 tablet (500 mcg total) by mouth daily. 09/02/17  Yes Angiulli, Lavon Paganini, PA-C  ?insulin aspart (NOVOLOG) 100 UNIT/ML FlexPen Inject 10 Units into the skin 3 (three) times daily with meals. ?Patient not taking: Reported on 10/21/2021 09/02/17   Angiulli, Lavon Paganini, PA-C  ?methocarbamol (ROBAXIN) 500 MG tablet Take 1 tablet (500 mg total) by mouth 2 (two) times daily as needed for muscle spasms. ?Patient not taking: Reported on 10/21/2021 11/07/17   Jamse Arn, MD  ?ramelteon (ROZEREM) 8 MG tablet Take 1 tablet (8 mg total) by mouth at bedtime. ?Patient not taking: Reported on 10/21/2021 09/02/17   Cathlyn Parsons, PA-C  ? ? ? ?  Vital Signs: ?BP (!) 162/73 (BP Location: Right Arm)   Pulse 62   Temp 97.7 ?F (36.5 ?C)   Resp 16   Ht '5\' 11"'$  (1.803 m)   Wt 158 lb 11.7 oz (72 kg)   SpO2 98%   BMI 22.14 kg/m?  ? ?Physical Exam ?Abdominal:  ?   General: Bowel sounds are normal.  ?   Comments: Site is clean and dry ?Sl tender to touch ?No bleeding ?No hematoma  ? ? ?Imaging: ?CT Soft Tissue Neck Wo Contrast ? ?Result Date: 10/21/2021 ?CLINICAL DATA:  Epiglottitis or tonsillitis  suspected. Severe neck pain with inability to swallow. Lymph node swelling for months, slowly worsening. EXAM: CT NECK WITHOUT CONTRAST TECHNIQUE: Multidetector CT imaging of the neck was performed following the standard protocol without intravenous contrast. RADIATION DOSE REDUCTION: This exam was performed according to the departmental dose-optimization program which includes automated exposure control, adjustment of the mA and/or kV according to patient size and/or use of iterative reconstruction technique. COMPARISON:  None. FINDINGS: Pharynx and larynx: Bulky mass at the left glossotonsillar sulcus which measures 3.2 cm. Salivary glands: The submandibular glands are distorted by cervical adenopathy. No primary salivary disease suspected Thyroid: Normal Lymph nodes: Bulky bilateral jugular chain lymphadenopathy with cavitary features even by noncontrast technique. Nodal conglomerate in the left upper jugular chain measures up to 5 cm with indistinct, lobulated margins. Vascular: Limited without contrast Limited intracranial: Extensive atherosclerotic calcification for age. Visualized orbits: Right scleral band Mastoids and visualized paranasal sinuses: No acute finding. Right maxillary sinus atelectasis. Skeleton: No visible hematogenous metastasis. 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 Even without contrast there is probable extracapsular tumor on the left. Electronically Signed   By: Jorje Guild M.D.   On: 10/21/2021 06:38  ? ?[place] CORE BIOPSY (LYMPH NODES) ? ?Result Date: 10/22/2021 ?INDICATION: Concern for head neck cancer. Please perform ultrasound-guided biopsy of dominant left supraclavicular nodal conglomeration for tissue diagnostic purposes. EXAM: ULTRASOUND-GUIDED LEFT NODAL CONGLOMERATION BIOPSY COMPARISON:  Neck CT-10/21/2021 MEDICATIONS: None ANESTHESIA/SEDATION: Moderate (conscious) sedation was employed  during this procedure as administered by the Interventional Radiology RN. A total of Versed 2 mg and Fentanyl 100 mcg was administered intravenously. Moderate Sedation Time: 10 minutes. The patient's level of consciousness and vital signs were monitored continuously by radiology nursing throughout the procedure under my direct supervision. COMPLICATIONS: None immediate. TECHNIQUE: Informed written consent was obtained from the patient after a discussion of the risks, benefits and alternatives to treatment. Questions regarding the procedure were encouraged and answered. Initial ultrasound scanning demonstrated large left-sided nodal mass measuring at least 7.4 x 2.8 cm (image 3), correlating with the dominant nodal conglomeration seen on preceding neck CT image 60, series 3. An ultrasound image was saved for documentation purposes. The procedure was planned. A timeout was performed prior to the initiation of the procedure. The operative was prepped and draped in the usual sterile fashion, and a sterile drape was applied covering the operative field. A timeout was performed prior to the initiation of the procedure. Local anesthesia was provided with 1% lidocaine with epinephrine. Under direct ultrasound guidance, an 18 gauge core needle device was utilized to obtain to obtain 6 core needle biopsies of the dominant left supraclavicular nodal conglomeration. The samples were placed in saline and submitted to pathology. The needle was removed and hemostasis was achieved with manual compression. Post procedure scan was negative for significant hematoma. A dressing was applied. The  patient tolerated the procedure well without immediate postprocedural complication. IMPRESSION: Technically successful ultrasound guided biopsy of dominant left supraclavicular nodal conglomeration. Electronically Signed   By: Sandi Mariscal M.D.   On: 10/22/2021 14:37  ? ?CT CHEST ABDOMEN PELVIS WO CONTRAST ? ?Result Date: 10/22/2021 ?CLINICAL  DATA:  Head and neck cancer.  Staging. EXAM: CT CHEST, ABDOMEN AND PELVIS WITHOUT CONTRAST TECHNIQUE: Multidetector CT imaging of the chest, abdomen and pelvis was performed following the standard protocol without IV

## 2021-10-23 NOTE — Telephone Encounter (Signed)
LVM to schedule consult with Dr. Squire ?

## 2021-10-23 NOTE — Progress Notes (Signed)
?[facility] ?Progress Note  ? ?Subjective:   s/p G tube and port yesterday.   I/Os NR but says UOP normal.  ? ?Objective ?Vitals:  ? 10/23/21 0012 10/23/21 0354 10/23/21 0749 10/23/21 0816  ?BP: (!) 160/77 (!) 162/73 (!) 184/78 (!) 173/73  ?Pulse: 69 62 61 62  ?Resp: '18 16 16   ' ?Temp: 98.2 ?F (36.8 ?C) 97.7 ?F (36.5 ?C) 98.3 ?F (36.8 ?C)   ?TempSrc:      ?SpO2: 98% 98% 97% (!) 84%  ?Weight:      ?Height:      ? ?Physical Exam ?Gen: thin chronically ill appearing man who is awake and comfortable  ?Eyes: anciteric, EOMI ?ENT: MMM ?Neck: fullness in neck L > R, has R chest port now ?CV:  RRR ?Abd:  soft, LLQ kidney transplant nontender, G tube in place ?Back: clear lungs ?GU: no foley ?Extr:  L BKA, R ankle edema  trace ?Neuro: aox3, fully conversant ? ?Additional Objective ?Labs: ?Basic Metabolic Panel: ?Recent Labs  ?Lab 10/22/21 ?1036 10/22/21 ?1816 10/23/21 ?0604  ?NA 127* 126* 130*  ?K 4.9 4.2 3.8  ?CL 95* 96* 99  ?CO2 17* 17* 21*  ?GLUCOSE 253* 310* 66*  ?BUN 55* 66* 67*  ?CREATININE 2.97* 3.11* 2.55*  ?CALCIUM 8.9 8.6* 8.8*  ? ?Liver Function Tests: ?Recent Labs  ?Lab 10/21/21 ?0420 10/21/21 ?0929  ?AST 17 15  ?ALT 9 11  ?ALKPHOS 118 103  ?BILITOT 0.4 0.5  ?PROT 6.7 6.4*  ?ALBUMIN 2.8* 2.6*  ? ?No results for input(s): LIPASE, AMYLASE in the last 168 hours. ?CBC: ?Recent Labs  ?Lab 10/21/21 ?0420 10/22/21 ?0414 10/23/21 ?0604  ?WBC 25.5* 16.5* 26.3*  ?NEUTROABS 18.4* 12.1*  --   ?HGB 9.7* 9.8* 9.7*  ?HCT 28.4* 29.8* 28.0*  ?MCV 84.3 85.4 83.6  ?PLT 324 305 320  ? ?Blood Culture ?   ?Component Value Date/Time  ? SDES BLOOD RIGHT WRIST 08/23/2017 1548  ? SPECREQUEST  08/23/2017 1548  ?  BOTTLES DRAWN AEROBIC ONLY Blood Culture adequate volume  ? CULT NO GROWTH 5 DAYS 08/23/2017 1548  ? REPTSTATUS 08/28/2017 FINAL 08/23/2017 1548  ? ? ?Cardiac Enzymes: ?No results for input(s): CKTOTAL, CKMB, CKMBINDEX, TROPONINI in the last 168 hours. ?CBG: ?Recent Labs  ?Lab 10/22/21 ?1943 10/23/21 ?0010  10/23/21 ?0355 10/23/21 ?0746 10/23/21 ?9030  ?GLUCAP 281* 229* 116* 41* 157*  ? ?Iron Studies:  ?Recent Labs  ?  10/22/21 ?0414  ?IRON 43*  ?TIBC 151*  ?FERRITIN 232  ? ?'@lablastinr3' @ ?Studies/Results: ?IR GASTROSTOMY TUBE MOD SED ? ?Result Date: 10/23/2021 ?INDICATION: Head neck cancer. Please perform percutaneous gastrostomy tube placement for enteric nutrition supplementation purposes as well as Port a catheter placement for chemotherapy administration purposes. EXAM: 1. IMPLANTED PORT A CATH PLACEMENT WITH ULTRASOUND AND FLUOROSCOPIC GUIDANCE 2. FLUOROSCOPIC GUIDED PERCUTANEOUS GASTROSTOMY TUBE PLACEMENT COMPARISON:  CT the chest, abdomen and pelvis-10/22/2021 MEDICATIONS: Glucagon 1 mg IV; Ancef 2 g IV; The antibiotic was administered within an appropriate time interval prior to skin puncture. ANESTHESIA/SEDATION: Moderate (conscious) sedation was employed during this procedure as administered by the Interventional Radiology RN. A total of Versed 3.5 mg and Fentanyl 175 mcg was administered intravenously. Moderate Sedation Time: 46 minutes. The patient's level of consciousness and vital signs were monitored continuously by radiology nursing throughout the procedure under my direct supervision. CONTRAST:  20 - cc Isovue 300, administered into the stomach lumen. FLUOROSCOPY TIME:  A total of 1 minute, 42 seconds (10 mGy) fluoroscopy time was utilized  for both procedures. COMPLICATIONS: None immediate. PROCEDURE: The procedures, risks, benefits, and alternatives were explained to the patient. Questions regarding the procedure were encouraged and answered. The patient understands and consents to both procedures. Attention was initially paid towards placement of the [facility] a Catheter. The right neck and chest were prepped with chlorhexidine in a sterile fashion, and a sterile drape was applied covering the operative field. Maximum barrier sterile technique with sterile gowns and gloves were used for the procedure. A  timeout was performed prior to the initiation of the procedure. Local anesthesia was provided with 1% lidocaine with epinephrine. After creating a small venotomy incision, a micropuncture kit was utilized to access the internal jugular vein. Real-time ultrasound guidance was utilized for vascular access including the acquisition of a permanent ultrasound image documenting patency of the accessed vessel. The microwire was kinked to measure appropriate catheter length. A subcutaneous port pocket was then created along the upper chest wall utilizing a combination of sharp and blunt dissection. The pocket was irrigated with sterile saline. A single lumen slim sized power injectable port was chosen for placement. The 8 Fr catheter was tunneled from the port pocket site to the venotomy incision. The port was placed in the pocket. The external catheter was trimmed to appropriate length. At the venotomy, an 8 Fr peel-away sheath was placed over a guidewire under fluoroscopic guidance. The catheter was then placed through the sheath and the sheath was removed. Final catheter positioning was confirmed and documented with a fluoroscopic spot radiograph. The port was accessed with a Huber needle, aspirated and flushed with heparinized saline. The venotomy site was closed with an interrupted 4-0 Vicryl suture. The port pocket incision was closed with interrupted 2-0 Vicryl suture. Dermabond and Steri-strips were applied to both incisions. Dressings were applied. ____________________________________________ Next, attention was paid towards placement of the gastrostomy tube. The left upper quadrant was sterilely prepped and draped. An oral gastric catheter was inserted into the stomach under fluoroscopy. The existing nasogastric feeding tube was removed. The left costal margin and air / barium opacified transverse colon were identified and avoided. Air was injected into the stomach for insufflation and visualization under  fluoroscopy. Under sterile conditions a 17 gauge trocar needle was utilized to access the stomach percutaneously beneath the left subcostal margin after the overlying soft tissues were anesthetized with 1% Lidocaine with epinephrine. Needle position was confirmed within the stomach with aspiration of air and injection of small amount of contrast. A single T tack was deployed for gastropexy. Over an Amplatz guide wire, a 9-French sheath was inserted into the stomach. A snare device was utilized to capture the oral gastric catheter. The snare device was pulled retrograde from the stomach up the esophagus and out the oropharynx. The 24-French pull-through gastrostomy was connected to the snare device and pulled antegrade through the oropharynx down the esophagus into the stomach and then through the percutaneous tract external to the patient. The gastrostomy was assembled externally. Contrast injection confirms position in the stomach. Several spot radiographic images were obtained in various obliquities for documentation. Dressings were placed. The patient tolerated both above procedures well without immediate post procedural complication. FINDINGS: After catheter placement, the tip lies within the superior cavoatrial junction. The catheter aspirates and flushes normally and is ready for immediate use. After successful fluoroscopic guided placement, the gastrostomy tube is appropriately positioned with internal disc against the ventral aspect of the gastric lumen. IMPRESSION: 1. Successful placement of a right internal jugular approach power injectable  Port-A-Cath. The Port a catheter is ready for immediate use. 2. Successful fluoroscopic insertion of a 24-French pull-through gastrostomy tube. The gastrostomy may be used immediately for medication administration and may be utilized in 24 hrs for the initiation of feeds. Electronically Signed   By: Sandi Mariscal M.D.   On: 10/23/2021 09:06  ? ?[place] CORE BIOPSY (LYMPH  NODES) ? ?Result Date: 10/22/2021 ?INDICATION: Concern for head neck cancer. Please perform ultrasound-guided biopsy of dominant left supraclavicular nodal conglomeration for tissue diagnostic purposes. EXAM: ULTRASOUND-GUI

## 2021-10-23 NOTE — Progress Notes (Signed)
Initial Nutrition Assessment ? ?DOCUMENTATION CODES:  ? ?Non-severe (moderate) malnutrition in context of chronic illness ? ?INTERVENTION:  ?-Monitor magnesium, phosphorus, potassium BID for at least three days as pt is at risk of refeeding syndrome and replete as needed.  ?-Initiate tube feeding via PEG:  ?Osmolite 1.5 @ 30 ml/hr and advance 10 ml every 8 hours until goal rate of 60 ml/hr. This provides 2160 kcal, 90 grams of protein and 1097 ml free water.  ? ?- Starting on continuous feeding given risk for refeeding syndrome. Plan to assess for ability to transition to bolus feeding prior to discharge. This was discussed with pt.  ? ? ?NUTRITION DIAGNOSIS:  ? 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 ? ? ?GOAL:  ? ?Patient will meet greater than or equal to 90% of their needs ? ? ?MONITOR:  ? ?PO intake, Supplement acceptance, Labs, Weight trends, Diet advancement ? ?REASON FOR ASSESSMENT:  ? ?Consult ?Enteral/tube feeding initiation and management ? ?ASSESSMENT:  ? 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  ? ?3/13 - PEG placement, left cervical lymph node biopsy and port placement via IR  ? ?Pt was hyperglycemic this morning. Will improve with initiation of tube feeding. Noted pt on sliding scale q 4 hrs and basal insulin coverage.  ? ?Pt is very pleasant during visit and sitting up in chair. Per pt, pt reports decreased appetite for  the last 3-4 weeks due to just not having an appetite and feeling like eating. Pt reports that during this time, he would still try to eat some vegetables, fruits and protein. Pt reports that during this 3-4 weeks, he lost weight going from 175# to 161# due to decreased appetite. Pt reports that on this past Saturday, he started having a really difficult time swallowing and tried eating a chicken noodle soup and sherbet on this day. Per pt, pt typically does not eat breakfast as pt reports he tries to sleep in to catch up on rest and that his first meal is typically around lunch time. Pt reports that for lunch he has some type of sandwich on brioche bread with chips and a salad and some type of fruit such as peaches. Pt reports that for dinner he typically has hamburger mixed with noodles, celery or carrots. Pt also reports that he drinks diet sodas and tries to drink three 16 fl oz aquafina water/day. Pt reports that he lives alone with his cat Ms. Perrin Smack and that he prepares his own meals. 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 Pt reports that his brother lives nearby who helps him run errands and reports that his brother will be helping him to get to the cancer center for appointments. Per pt, home meds include centrum silver, calcium, and vitamin D.  ? ?Dietetic intern discussed with pt enteral feeding via PEG and pt agreeable to  starting on continuous feeds for at least 24 hours before starting bolus feeds. Discussed the importance of adequate nutrition with pt due to significant weight loss and pt meeting criteria for mod PCM. Discussed with pt that the dietitians at the [facility] will be following up with him and providing continued education on bolus feeding, etc after discharge.  ? ?Admit wt: 72 kg  ?Per pt, pt's weight went from 175# to 161# within the last 3-4 weeks. Per pt report, pt has experienced an  8% weight loss within the last one month which is significant for time frame.  ? ?Labs reviewed and include: Sodium: 130, BUN: 67, Creatinine: 2.55, Iron: 43, Hemoglobin: 9.7, A1C: 8.2, CBG's: 41-288 x 24 hours ? ?Medications reviewed and include:  ? docusate  100 mg Per Tube BID  ? furosemide  40 mg Per Tube BID  ? insulin aspart  0-6 Units Subcutaneous Q4H  ? insulin glargine-yfgn  12 Units Subcutaneous QHS  ? methylPREDNISolone (SOLU-MEDROL) injection  40 mg Intravenous Daily  ? ?Continuous Infusions: ? dextrose 5 % and 0.45% NaCl 75 mL/hr at 10/23/21 0954  ? mycophenolate (CELLCEPT) IV 500 mg (10/23/21 0956)  ?Pt is receiving 306 kcal from dextrose 5% @ 75 ml/hr.  ? ?NUTRITION - FOCUSED PHYSICAL EXAM: ? ?Flowsheet Row Most Recent Value  ?Orbital Region Mild depletion  ?Upper Arm Region Moderate depletion  ?Thoracic and Lumbar Region Mild depletion  ?Buccal Region Mild depletion  ?Temple Region Mild depletion  ?Clavicle Bone Region Mild depletion  ?Clavicle and Acromion Bone Region Mild depletion  ?Scapular Bone Region Mild depletion  ?Dorsal Hand Mild depletion  ?Patellar Region Mild depletion  ?Anterior Thigh Region Mild depletion  ?Posterior Calf Region Mild depletion  ?Edema (RD Assessment) None  ?Hair Reviewed  ?Eyes Reviewed  ?Mouth Reviewed  ?Skin Reviewed  ?Nails Reviewed  ? ?  ? ? ?Diet Order:   ?Diet Order   ? ?       ?  Diet NPO time specified  Diet effective now       ?  ? ?  ?  ? ?  ? ? ?EDUCATION NEEDS:  ? ?Education needs have been addressed ? ?Skin:  Skin Assessment: Reviewed RN Assessment ? ?Last BM:  3/14 ? ?Height:  ? ?Ht Readings from Last 1 Encounters:  ?10/21/21 '5\' 11"'$  (1.803 m)  ? ? ?Weight:  ? ?Wt Readings from Last 1 Encounters:  ?10/21/21 72 kg  ? ? ?BMI:  Body mass index is 22.14 kg/m?. ? ?Estimated Nutritional Needs:  ? ?Kcal:  2100 - 2300 ? ?Protein:  105 - 120 grams ? ?Fluid:  >/= 2.1 L ? ? ? ?Maryruth Hancock, Dietetic Intern ?10/23/2021 4:45 PM ?

## 2021-10-23 NOTE — Progress Notes (Signed)
Arthur Burke   DOB:1965-11-07   OM#:[id number]   JGG#:[id number] ? ?Oncology follow up  ? ?Subjective: Patient underwent PEG placement, left cervical lymph node biopsy and port placement by IR yesterday.  He tolerated procedures well.  Still has mild pain at the PEG site, no other new complaints. ? ? ?Objective:  ?Vitals:  ? 10/23/21 0816 10/23/21 1151  ?BP: (!) 173/73 (!) 167/78  ?Pulse: 62 66  ?Resp:  18  ?Temp:  98.2 ?F (36.8 ?C)  ?SpO2: (!) 84% 100%  ?  Body mass index is 22.14 kg/m?. ? ?Intake/Output Summary (Last 24 hours) at 10/23/2021 1244 ?Last data filed at 10/22/2021 1800 ?Gross per 24 hour  ?Intake 100.24 ml  ?Output --  ?Net 100.24 ml  ? ? ? Sclerae unicteric ? Large bilateral upper cervical lymphadenopathy ? Peg site clean  ? No leg edema  ? ?CBG (last 3)  ?Recent Labs  ?  10/23/21 ?0746 10/23/21 ?5465 10/23/21 ?1148  ?GLUCAP 41* 157* 100*  ? ? ? ?Labs:  ?Urine Studies ?No results for input(s): UHGB, CRYS in the last 72 hours. ? ?Invalid input(s): UACOL, UAPR, USPG, UPH, UTP, UGL, UKET, UBIL, UNIT, UROB, ULEU, UEPI, UWBC, URBC, UBAC, CAST, UCOM, BILUA ? ?Basic Metabolic Panel: ?Recent Labs  ?Lab 10/21/21 ?2103 10/22/21 ?0414 10/22/21 ?1036 10/22/21 ?1816 10/23/21 ?0604  ?NA 125* 126* 127* 126* 130*  ?K 4.6 4.3 4.9 4.2 3.8  ?CL 93* 96* 95* 96* 99  ?CO2 18* 16* 17* 17* 21*  ?GLUCOSE 146* 181* 253* 310* 66*  ?BUN 40* 49* 55* 66* 67*  ?CREATININE 2.50* 2.74* 2.97* 3.11* 2.55*  ?CALCIUM 8.9 8.8* 8.9 8.6* 8.8*  ? ?GFR ?Estimated Creatinine Clearance: 33.7 mL/min (A) (by C-G formula based on SCr of 2.55 mg/dL (H)). ?Liver Function Tests: ?Recent Labs  ?Lab 10/21/21 ?0420 10/21/21 ?0929  ?AST 17 15  ?ALT 9 11  ?ALKPHOS 118 103  ?BILITOT 0.4 0.5  ?PROT 6.7 6.4*  ?ALBUMIN 2.8* 2.6*  ? ?No results for input(s): LIPASE, AMYLASE in the last 168 hours. ?No results for input(s): AMMONIA in the last 168 hours. ?Coagulation profile ?Recent Labs  ?Lab 10/22/21 ?0354  ?INR 1.2  ? ? ?CBC: ?Recent Labs  ?Lab 10/21/21 ?0420  10/22/21 ?0414 10/23/21 ?0604  ?WBC 25.5* 16.5* 26.3*  ?NEUTROABS 18.4* 12.1*  --   ?HGB 9.7* 9.8* 9.7*  ?HCT 28.4* 29.8* 28.0*  ?MCV 84.3 85.4 83.6  ?PLT 324 305 320  ? ?Cardiac Enzymes: ?No results for input(s): CKTOTAL, CKMB, CKMBINDEX, TROPONINI in the last 168 hours. ?BNP: ?Invalid input(s): POCBNP ?CBG: ?Recent Labs  ?Lab 10/23/21 ?0010 10/23/21 ?0355 10/23/21 ?0746 10/23/21 ?6568 10/23/21 ?1148  ?GLUCAP 229* 116* 41* 157* 100*  ? ?D-Dimer ?No results for input(s): DDIMER in the last 72 hours. ?Hgb A1c ?Recent Labs  ?  10/21/21 ?1140  ?HGBA1C 8.2*  ? ?Lipid Profile ?No results for input(s): CHOL, HDL, LDLCALC, TRIG, CHOLHDL, LDLDIRECT in the last 72 hours. ?Thyroid function studies ?No results for input(s): TSH, T4TOTAL, T3FREE, THYROIDAB in the last 72 hours. ? ?Invalid input(s): FREET3 ?Anemia work up ?Recent Labs  ?  10/22/21 ?0414  ?FERRITIN 232  ?TIBC 151*  ?IRON 43*  ?RETICCTPCT 1.5  ? ?Microbiology ?No results found for this or any previous visit (from the past 240 hour(s)). ? ? ? ?Studies:  ?IR GASTROSTOMY TUBE MOD SED ? ?Result Date: 10/23/2021 ?INDICATION: Head neck cancer. Please perform percutaneous gastrostomy tube placement for enteric nutrition supplementation purposes as well as Port a  catheter placement for chemotherapy administration purposes. EXAM: 1. IMPLANTED PORT A CATH PLACEMENT WITH ULTRASOUND AND FLUOROSCOPIC GUIDANCE 2. FLUOROSCOPIC GUIDED PERCUTANEOUS GASTROSTOMY TUBE PLACEMENT COMPARISON:  CT the chest, abdomen and pelvis-10/22/2021 MEDICATIONS: Glucagon 1 mg IV; Ancef 2 g IV; The antibiotic was administered within an appropriate time interval prior to skin puncture. ANESTHESIA/SEDATION: Moderate (conscious) sedation was employed during this procedure as administered by the Interventional Radiology RN. A total of Versed 3.5 mg and Fentanyl 175 mcg was administered intravenously. Moderate Sedation Time: 46 minutes. The patient's level of consciousness and vital signs were monitored  continuously by radiology nursing throughout the procedure under my direct supervision. CONTRAST:  20 - cc Isovue 300, administered into the stomach lumen. FLUOROSCOPY TIME:  A total of 1 minute, 42 seconds (10 mGy) fluoroscopy time was utilized for both procedures. COMPLICATIONS: None immediate. PROCEDURE: The procedures, risks, benefits, and alternatives were explained to the patient. Questions regarding the procedure were encouraged and answered. The patient understands and consents to both procedures. Attention was initially paid towards placement of the [facility] a Catheter. The right neck and chest were prepped with chlorhexidine in a sterile fashion, and a sterile drape was applied covering the operative field. Maximum barrier sterile technique with sterile gowns and gloves were used for the procedure. A timeout was performed prior to the initiation of the procedure. Local anesthesia was provided with 1% lidocaine with epinephrine. After creating a small venotomy incision, a micropuncture kit was utilized to access the internal jugular vein. Real-time ultrasound guidance was utilized for vascular access including the acquisition of a permanent ultrasound image documenting patency of the accessed vessel. The microwire was kinked to measure appropriate catheter length. A subcutaneous port pocket was then created along the upper chest wall utilizing a combination of sharp and blunt dissection. The pocket was irrigated with sterile saline. A single lumen slim sized power injectable port was chosen for placement. The 8 Fr catheter was tunneled from the port pocket site to the venotomy incision. The port was placed in the pocket. The external catheter was trimmed to appropriate length. At the venotomy, an 8 Fr peel-away sheath was placed over a guidewire under fluoroscopic guidance. The catheter was then placed through the sheath and the sheath was removed. Final catheter positioning was confirmed and documented with a  fluoroscopic spot radiograph. The port was accessed with a Huber needle, aspirated and flushed with heparinized saline. The venotomy site was closed with an interrupted 4-0 Vicryl suture. The port pocket incision was closed with interrupted 2-0 Vicryl suture. Dermabond and Steri-strips were applied to both incisions. Dressings were applied. ____________________________________________ Next, attention was paid towards placement of the gastrostomy tube. The left upper quadrant was sterilely prepped and draped. An oral gastric catheter was inserted into the stomach under fluoroscopy. The existing nasogastric feeding tube was removed. The left costal margin and air / barium opacified transverse colon were identified and avoided. Air was injected into the stomach for insufflation and visualization under fluoroscopy. Under sterile conditions a 17 gauge trocar needle was utilized to access the stomach percutaneously beneath the left subcostal margin after the overlying soft tissues were anesthetized with 1% Lidocaine with epinephrine. Needle position was confirmed within the stomach with aspiration of air and injection of small amount of contrast. A single T tack was deployed for gastropexy. Over an Amplatz guide wire, a 9-French sheath was inserted into the stomach. A snare device was utilized to capture the oral gastric catheter. The snare device was pulled retrograde  from the stomach up the esophagus and out the oropharynx. The 24-French pull-through gastrostomy was connected to the snare device and pulled antegrade through the oropharynx down the esophagus into the stomach and then through the percutaneous tract external to the patient. The gastrostomy was assembled externally. Contrast injection confirms position in the stomach. Several spot radiographic images were obtained in various obliquities for documentation. Dressings were placed. The patient tolerated both above procedures well without immediate post  procedural complication. FINDINGS: After catheter placement, the tip lies within the superior cavoatrial junction. The catheter aspirates and flushes normally and is ready for immediate use. After successful fluoro

## 2021-10-23 NOTE — Plan of Care (Signed)
  Problem: Education: Goal: Knowledge of General Education information will improve Description: Including pain rating scale, medication(s)/side effects and non-pharmacologic comfort measures Outcome: Progressing   Problem: Health Behavior/Discharge Planning: Goal: Ability to manage health-related needs will improve Outcome: Progressing   Problem: Nutrition: Goal: Adequate nutrition will be maintained Outcome: Progressing   Problem: Pain Managment: Goal: General experience of comfort will improve Outcome: Progressing   Problem: Safety: Goal: Ability to remain free from injury will improve Outcome: Progressing   

## 2021-10-24 ENCOUNTER — Telehealth: Payer: Self-pay | Admitting: Radiation Oncology

## 2021-10-24 DIAGNOSIS — Z931 Gastrostomy status: Secondary | ICD-10-CM | POA: Diagnosis not present

## 2021-10-24 DIAGNOSIS — G8929 Other chronic pain: Secondary | ICD-10-CM | POA: Diagnosis not present

## 2021-10-24 DIAGNOSIS — J392 Other diseases of pharynx: Secondary | ICD-10-CM | POA: Diagnosis not present

## 2021-10-24 DIAGNOSIS — R1310 Dysphagia, unspecified: Secondary | ICD-10-CM | POA: Diagnosis not present

## 2021-10-24 LAB — CBC
HCT: 26.8 % — ABNORMAL LOW (ref 39.0–52.0)
Hemoglobin: 9 g/dL — ABNORMAL LOW (ref 13.0–17.0)
MCH: 29 pg (ref 26.0–34.0)
MCHC: 33.6 g/dL (ref 30.0–36.0)
MCV: 86.5 fL (ref 80.0–100.0)
Platelets: 255 10*3/uL (ref 150–400)
RBC: 3.1 MIL/uL — ABNORMAL LOW (ref 4.22–5.81)
RDW: 14.3 % (ref 11.5–15.5)
WBC: 13.4 10*3/uL — ABNORMAL HIGH (ref 4.0–10.5)
nRBC: 0 % (ref 0.0–0.2)

## 2021-10-24 LAB — GLUCOSE, CAPILLARY
Glucose-Capillary: 279 mg/dL — ABNORMAL HIGH (ref 70–99)
Glucose-Capillary: 320 mg/dL — ABNORMAL HIGH (ref 70–99)
Glucose-Capillary: 389 mg/dL — ABNORMAL HIGH (ref 70–99)
Glucose-Capillary: 415 mg/dL — ABNORMAL HIGH (ref 70–99)
Glucose-Capillary: 440 mg/dL — ABNORMAL HIGH (ref 70–99)
Glucose-Capillary: 466 mg/dL — ABNORMAL HIGH (ref 70–99)
Glucose-Capillary: 518 mg/dL (ref 70–99)

## 2021-10-24 LAB — RENAL FUNCTION PANEL
Albumin: 2.5 g/dL — ABNORMAL LOW (ref 3.5–5.0)
Anion gap: 9 (ref 5–15)
BUN: 56 mg/dL — ABNORMAL HIGH (ref 6–20)
CO2: 21 mmol/L — ABNORMAL LOW (ref 22–32)
Calcium: 8.1 mg/dL — ABNORMAL LOW (ref 8.9–10.3)
Chloride: 96 mmol/L — ABNORMAL LOW (ref 98–111)
Creatinine, Ser: 1.91 mg/dL — ABNORMAL HIGH (ref 0.61–1.24)
GFR, Estimated: 41 mL/min — ABNORMAL LOW (ref >60)
Glucose, Bld: 487 mg/dL — ABNORMAL HIGH (ref 70–99)
Phosphorus: 3 mg/dL (ref 2.5–4.6)
Potassium: 4.6 mmol/L (ref 3.5–5.1)
Sodium: 126 mmol/L — ABNORMAL LOW (ref 135–145)

## 2021-10-24 LAB — MAGNESIUM: Magnesium: 2 mg/dL (ref 1.7–2.4)

## 2021-10-24 MED ORDER — OSMOLITE 1.5 CAL PO LIQD
360.0000 mL | Freq: Four times a day (QID) | ORAL | Status: DC
  Administered 2021-10-24 – 2021-10-30 (×24): 360 mL
  Filled 2021-10-24 (×9): qty 474

## 2021-10-24 MED ORDER — ENOXAPARIN SODIUM 40 MG/0.4ML IJ SOSY
40.0000 mg | PREFILLED_SYRINGE | INTRAMUSCULAR | Status: DC
  Administered 2021-10-25 – 2021-10-30 (×6): 40 mg via SUBCUTANEOUS
  Filled 2021-10-24 (×6): qty 0.4

## 2021-10-24 MED ORDER — INSULIN ASPART 100 UNIT/ML IJ SOLN
4.0000 [IU] | INTRAMUSCULAR | Status: DC
  Administered 2021-10-24: 4 [IU] via SUBCUTANEOUS

## 2021-10-24 MED ORDER — INSULIN GLARGINE-YFGN 100 UNIT/ML ~~LOC~~ SOLN
16.0000 [IU] | Freq: Every day | SUBCUTANEOUS | Status: DC
  Administered 2021-10-24: 16 [IU] via SUBCUTANEOUS
  Filled 2021-10-24 (×3): qty 0.16

## 2021-10-24 MED ORDER — INSULIN ASPART 100 UNIT/ML IJ SOLN
4.0000 [IU] | Freq: Four times a day (QID) | INTRAMUSCULAR | Status: DC
  Administered 2021-10-24 – 2021-10-25 (×3): 4 [IU] via SUBCUTANEOUS

## 2021-10-24 MED ORDER — INSULIN ASPART 100 UNIT/ML IJ SOLN
9.0000 [IU] | Freq: Once | INTRAMUSCULAR | Status: AC
  Administered 2021-10-24: 9 [IU] via SUBCUTANEOUS

## 2021-10-24 MED ORDER — INSULIN GLARGINE-YFGN 100 UNIT/ML ~~LOC~~ SOLN
8.0000 [IU] | Freq: Once | SUBCUTANEOUS | Status: DC
  Filled 2021-10-24: qty 0.08

## 2021-10-24 MED ORDER — INSULIN ASPART 100 UNIT/ML IJ SOLN
0.0000 [IU] | INTRAMUSCULAR | Status: DC
  Administered 2021-10-24 (×2): 9 [IU] via SUBCUTANEOUS
  Administered 2021-10-24: 7 [IU] via SUBCUTANEOUS
  Administered 2021-10-25: 5 [IU] via SUBCUTANEOUS
  Administered 2021-10-25: 9 [IU] via SUBCUTANEOUS
  Administered 2021-10-25: 5 [IU] via SUBCUTANEOUS
  Administered 2021-10-25: 9 [IU] via SUBCUTANEOUS
  Administered 2021-10-25: 3 [IU] via SUBCUTANEOUS
  Administered 2021-10-25: 7 [IU] via SUBCUTANEOUS
  Administered 2021-10-26: 9 [IU] via SUBCUTANEOUS
  Administered 2021-10-26: 7 [IU] via SUBCUTANEOUS
  Administered 2021-10-26: 9 [IU] via SUBCUTANEOUS
  Administered 2021-10-27: 7 [IU] via SUBCUTANEOUS
  Administered 2021-10-27: 3 [IU] via SUBCUTANEOUS

## 2021-10-24 MED ORDER — PREDNISONE 5 MG PO TABS
5.0000 mg | ORAL_TABLET | Freq: Every day | ORAL | Status: DC
  Administered 2021-10-25 – 2021-10-28 (×4): 5 mg
  Filled 2021-10-24 (×4): qty 2
  Filled 2021-10-24: qty 1

## 2021-10-24 NOTE — Progress Notes (Signed)
OT Cancellation Note ? ?Patient Details ?Name: Arthur Burke ?MRN: [id number] ?DOB: 1966/06/04 ? ? ?Cancelled Treatment:    Reason Eval/Treat Not Completed: OT screened, no needs identified, will sign off (Pt reporting independence with ADLs and functional mobility, per PT pt demonstrated ability to don/doff prostheses and perform functional mobility. OT will s/o please reconsult if there is a change in pt status.) ? ?Lynnda Child, OTD, OTR/L ?Acute Rehab ?[phone] ? ?Kaylyn Lim ?10/24/2021, 11:03 AM ?

## 2021-10-24 NOTE — Progress Notes (Signed)
?[facility] ?Progress Note  ? ?Subjective:   using G tube for meds now - no issues.    UOP 742m ? ?Objective ?Vitals:  ? 10/23/21 1611 10/23/21 1944 10/24/21 0804 10/24/21 0829  ?BP: (!) 146/72 (!) 144/77 (!) 156/70   ?Pulse: 62 65 63   ?Resp: 17  18   ?Temp: 98 ?F (36.7 ?C) 98 ?F (36.7 ?C) 98 ?F (36.7 ?C)   ?TempSrc:  Oral    ?SpO2: 100% 99% 96%   ?Weight:    68.4 kg  ?Height:      ? ?Physical Exam ?Gen: thin chronically ill appearing man who is awake and comfortable  ?Eyes: anciteric, EOMI ?ENT: MMM ?Neck: fullness in neck L > R, has R chest port now ?CV:  RRR ?Abd:  soft, LLQ kidney transplant nontender, G tube in place ?Back: clear lungs ?GU: no foley ?Extr:  L BKA, R ankle edema  trace ?Neuro: aox3, fully conversant ? ?Additional Objective ?Labs: ?Basic Metabolic Panel: ?Recent Labs  ?Lab 10/22/21 ?1816 10/23/21 ?0604 10/23/21 ?1635 10/24/21 ?07867 ?NA 126* 130*  --  126*  ?K 4.2 3.8  --  4.6  ?CL 96* 99  --  96*  ?CO2 17* 21*  --  21*  ?GLUCOSE 310* 66*  --  487*  ?BUN 66* 67*  --  56*  ?CREATININE 3.11* 2.55*  --  1.91*  ?CALCIUM 8.6* 8.8*  --  8.1*  ?PHOS  --   --  3.8 3.0  ? ? ?Liver Function Tests: ?Recent Labs  ?Lab 10/21/21 ?0420 10/21/21 ?0929 10/24/21 ?0610  ?AST 17 15  --   ?ALT 9 11  --   ?ALKPHOS 118 103  --   ?BILITOT 0.4 0.5  --   ?PROT 6.7 6.4*  --   ?ALBUMIN 2.8* 2.6* 2.5*  ? ? ?No results for input(s): LIPASE, AMYLASE in the last 168 hours. ?CBC: ?Recent Labs  ?Lab 10/21/21 ?0420 10/22/21 ?0414 10/23/21 ?0604 10/24/21 ?06720 ?WBC 25.5* 16.5* 26.3* 13.4*  ?NEUTROABS 18.4* 12.1*  --   --   ?HGB 9.7* 9.8* 9.7* 9.0*  ?HCT 28.4* 29.8* 28.0* 26.8*  ?MCV 84.3 85.4 83.6 86.5  ?PLT 324 305 320 255  ? ? ?Blood Culture ?   ?Component Value Date/Time  ? SDES BLOOD RIGHT WRIST 08/23/2017 1548  ? SPECREQUEST  08/23/2017 1548  ?  BOTTLES DRAWN AEROBIC ONLY Blood Culture adequate volume  ? CULT NO GROWTH 5 DAYS 08/23/2017 1548  ? REPTSTATUS 08/28/2017 FINAL 08/23/2017 1548  ? ? ?Cardiac  Enzymes: ?No results for input(s): CKTOTAL, CKMB, CKMBINDEX, TROPONINI in the last 168 hours. ?CBG: ?Recent Labs  ?Lab 10/23/21 ?2001 10/23/21 ?2330 10/24/21 ?0420 10/24/21 ?0800 10/24/21 ?0956  ?GLUCAP 219* 299* 415* 518* 466*  ? ? ?Iron Studies:  ?Recent Labs  ?  10/22/21 ?0414  ?IRON 43*  ?TIBC 151*  ?FERRITIN 232  ? ? ?'@lablastinr3' @ ?Studies/Results: ?[place]al Transplant w/Doppler ? ?Result Date: 10/23/2021 ?CLINICAL DATA:  Acute kidney injury. EXAM: ULTRASOUND OF RENAL TRANSPLANT WITH RENAL DOPPLER ULTRASOUND TECHNIQUE: Ultrasound examination of the renal transplant was performed with gray-scale, color and duplex doppler evaluation. COMPARISON:  CT chest, abdomen, and pelvis from yesterday. FINDINGS: Transplant kidney location: LLQ Transplant Kidney: Renal measurements: 12.2 x 5.6 x 4.7 cm = volume: 169 mL. Normal in size. Mildly increased parenchymal echogenicity. No evidence of mass or hydronephrosis. No peri-transplant fluid collection seen. Color flow in the main renal artery:  Yes Color flow in the main renal  vein:  Yes Duplex Doppler Evaluation: Main Renal Artery Velocity: 131 cm/sec Main Renal Artery Resistive Index: 0.85 Venous waveform in main renal vein:  Present Intrarenal resistive index in upper pole:  0.78 (normal 0.6-0.8; equivocal 0.8-0.9; abnormal >= 0.9) Intrarenal resistive index in lower pole: 0.94 (normal 0.6-0.8; equivocal 0.8-0.9; abnormal >= 0.9) Bladder: Normal for degree of bladder distention. Other findings:  None. IMPRESSION: 1. Mildly increased parenchymal echogenicity and elevated resistive index in the left lower quadrant transplant kidney, nonspecific, but could reflect medical renal disease. 2. No hydronephrosis. Electronically Signed   By: Titus Dubin M.D.   On: 10/23/2021 13:46  ? ?IR GASTROSTOMY TUBE MOD SED ? ?Result Date: 10/23/2021 ?INDICATION: Head neck cancer. Please perform percutaneous gastrostomy tube placement for enteric nutrition supplementation purposes as well  as Port a catheter placement for chemotherapy administration purposes. EXAM: 1. IMPLANTED PORT A CATH PLACEMENT WITH ULTRASOUND AND FLUOROSCOPIC GUIDANCE 2. FLUOROSCOPIC GUIDED PERCUTANEOUS GASTROSTOMY TUBE PLACEMENT COMPARISON:  CT the chest, abdomen and pelvis-10/22/2021 MEDICATIONS: Glucagon 1 mg IV; Ancef 2 g IV; The antibiotic was administered within an appropriate time interval prior to skin puncture. ANESTHESIA/SEDATION: Moderate (conscious) sedation was employed during this procedure as administered by the Interventional Radiology RN. A total of Versed 3.5 mg and Fentanyl 175 mcg was administered intravenously. Moderate Sedation Time: 46 minutes. The patient's level of consciousness and vital signs were monitored continuously by radiology nursing throughout the procedure under my direct supervision. CONTRAST:  20 - cc Isovue 300, administered into the stomach lumen. FLUOROSCOPY TIME:  A total of 1 minute, 42 seconds (10 mGy) fluoroscopy time was utilized for both procedures. COMPLICATIONS: None immediate. PROCEDURE: The procedures, risks, benefits, and alternatives were explained to the patient. Questions regarding the procedure were encouraged and answered. The patient understands and consents to both procedures. Attention was initially paid towards placement of the [facility] a Catheter. The right neck and chest were prepped with chlorhexidine in a sterile fashion, and a sterile drape was applied covering the operative field. Maximum barrier sterile technique with sterile gowns and gloves were used for the procedure. A timeout was performed prior to the initiation of the procedure. Local anesthesia was provided with 1% lidocaine with epinephrine. After creating a small venotomy incision, a micropuncture kit was utilized to access the internal jugular vein. Real-time ultrasound guidance was utilized for vascular access including the acquisition of a permanent ultrasound image documenting patency of the accessed  vessel. The microwire was kinked to measure appropriate catheter length. A subcutaneous port pocket was then created along the upper chest wall utilizing a combination of sharp and blunt dissection. The pocket was irrigated with sterile saline. A single lumen slim sized power injectable port was chosen for placement. The 8 Fr catheter was tunneled from the port pocket site to the venotomy incision. The port was placed in the pocket. The external catheter was trimmed to appropriate length. At the venotomy, an 8 Fr peel-away sheath was placed over a guidewire under fluoroscopic guidance. The catheter was then placed through the sheath and the sheath was removed. Final catheter positioning was confirmed and documented with a fluoroscopic spot radiograph. The port was accessed with a Huber needle, aspirated and flushed with heparinized saline. The venotomy site was closed with an interrupted 4-0 Vicryl suture. The port pocket incision was closed with interrupted 2-0 Vicryl suture. Dermabond and Steri-strips were applied to both incisions. Dressings were applied. ____________________________________________ Next, attention was paid towards placement of the gastrostomy tube. The left upper quadrant  was sterilely prepped and draped. An oral gastric catheter was inserted into the stomach under fluoroscopy. The existing nasogastric feeding tube was removed. The left costal margin and air / barium opacified transverse colon were identified and avoided. Air was injected into the stomach for insufflation and visualization under fluoroscopy. Under sterile conditions a 17 gauge trocar needle was utilized to access the stomach percutaneously beneath the left subcostal margin after the overlying soft tissues were anesthetized with 1% Lidocaine with epinephrine. Needle position was confirmed within the stomach with aspiration of air and injection of small amount of contrast. A single T tack was deployed for gastropexy. Over an  Amplatz guide wire, a 9-French sheath was inserted into the stomach. A snare device was utilized to capture the oral gastric catheter. The snare device was pulled retrograde from the stomach up the esophagus and

## 2021-10-24 NOTE — Progress Notes (Signed)
Oncology Nurse Navigator Documentation  ? ?Placed introductory call to new referral patient Arthur Burke and his brother Shanon Brow at separate times.  ?Introduced myself as the H&N oncology nurse navigator that works with Dr. Isidore Moos and Dr. Chryl Heck to whom he has been referred by Dr. Constance Holster.  He confirmed understanding of referral. ?Briefly explained my role as his navigator, provided my contact information.  ?Confirmed understanding of upcoming appts and [facility] location, explained arrival and registration process.  ?I encouraged him to call with questions/concerns as he moves forward with appts and procedures.   ?He verbalized understanding of information provided, expressed appreciation for my call. ? ? ?[name] RN, BSN, OCN ?Head & Neck Oncology Nurse Navigator ?[place] [facility] ?Phone # [phone]  ?Fax # [phone]  ?  ?

## 2021-10-24 NOTE — Progress Notes (Signed)
CBG 518. MD made aware, see new orders and no STAT lab verification. ?

## 2021-10-24 NOTE — Telephone Encounter (Signed)
LVM to schedule consult with Dr. Squire ?

## 2021-10-24 NOTE — Plan of Care (Signed)
  Problem: Education: Goal: Knowledge of General Education information will improve Description: Including pain rating scale, medication(s)/side effects and non-pharmacologic comfort measures Outcome: Progressing   Problem: Health Behavior/Discharge Planning: Goal: Ability to manage health-related needs will improve Outcome: Progressing   Problem: Nutrition: Goal: Adequate nutrition will be maintained Outcome: Progressing   

## 2021-10-24 NOTE — Progress Notes (Addendum)
? ?Subjective: ? ?Patient states he is feeling much more alert today. He was able to work with PT this AM and feels as though pain is better when he is up and moving. Chronic pain has returned to baseline now that he has been able to resume po home meds. States he was able to swallow spit this AM and is positive will continue to improve. Worried about elevated sugars this morning, is agreeable to continued changes to optimize insulin regimen. Has good understanding plan moving forward and gals for discharge. ? ?Objective: ? ?Vital signs in last 24 hours: ?Vitals:  ? 10/23/21 1611 10/23/21 1944 10/24/21 0804 10/24/21 0829  ?BP: (!) 146/72 (!) 144/77 (!) 156/70   ?Pulse: 62 65 63   ?Resp: 17  18   ?Temp: 98 ?F (36.7 ?C) 98 ?F (36.7 ?C) 98 ?F (36.7 ?C)   ?TempSrc:  Oral    ?SpO2: 100% 99% 96%   ?Weight:    68.4 kg  ?Height:      ? ?Weight change:  ? ?Intake/Output Summary (Last 24 hours) at 10/24/2021 1137 ?Last data filed at 10/23/2021 1945 ?Gross per 24 hour  ?Intake 407 ml  ?Output 700 ml  ?Net -293 ml  ? ?Physical Exam: ?Constitutional: middle aged man sitting comfortably in bedside chair, alert and responsive, NAD ?HEENT: normocephalic, atraumatic, unchanged non-tender, significant bilateral swelling of proximal neck, airway is patent, rash present on right frontal scalp w/ mild erythema ?Cardio: normal rate and rhythm, systolic murmur heard over P, M, T regions  ?Pulm: no increased work of breathing on room air ?Abd: non-distended, non-tender, +normal bowel sounds ?Extremities: BKA of right foot, AVF L arm  ?Skin: port-a-cath at right IJ; PEG tube at left lower abdomen, both covered with dressing during visit,  no pain to palpation of sites, no surrounding erythema.  ?Neuro: alert and oriented x3 ?Psych: normal affect ? ?Assessment/Plan: ?Mr. 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 ?  ?Principal Problem: ?  Dehydration with hyponatremia ?Active Problems: ?  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 His airway remains patent and states he was able to swallow some of his spit this AM. Began continuous tube feeds yesterday and restarted home po medications. Patient feeling more alert since changes. Per oncology, to receive outpatient PET scan once discharged. Seen by OT/PT this AM, they have not identified any barriers to discharge. ?-appreciate ENT and Oncology recommendations ?- f/u biopsy results ?  ?#Dysphagia ?#Nutrition via tube feeds ?Patient began continuous tube feeds yesterday. There is some concern for refeeding syndrome, per dietician. Recommending at least three days of Phos, Mg, and K monitoring BID, all wnl this AM on labs. Patient with some intake before becoming npo Saturday morning, refeeding syndrome less likely in this setting but will CTM. He is to be reassessed for bolus feeds before discharge. Nutrition to continue to see patient to help with teaching and determine tube feed needs. ?-Monitor magnesium, phosphorus, potassium BID for at least three days ?-Osmolite 1.5 @ 30 ml/hr and advance 10 ml every 8 hours until goal rate of 60 ml/hr. ?-f/u recs from registered dietician, diabetes coordinator  ?  ?#  Hyponatremia; improving ?Patient with asymptomatic hyponatremia with Na of 117 on admission likely 2/2 hypovolemic hyponatremia in setting of poor PO intake. Today hyponatremia continues to improve with corrected Na in setting of hyperglycemia of 132. Patient no longer on fluids post initiation of tube feeds yesterday evening. He remains euvolemic on exam, will CTM with these  changes. ?- daily BMP ?  ?#Pre- renal AKI ?Hx of renal failure s/p transplant ?Patient with left kidney transplant in place since 2005, followed by Dr. Susy Frizzle at [facility] nephrology. Pre-renal AKI likely secondary to dehydration and reduced po intake over past two weeks. Renal function continues to improve, creatinine today below baseline (2-2.2), today at 1.9. Patient started back on immunosuppressive home meds per tube yesterday with help from pharmacy for continued monitoring and dosing. ?- continue tacrolimus, mycophenolate ?-solumedrol d/c today, will start prednisone tomorrow AM.  ?- appreciate continued nephrology recs ?- daily BMP ?- strict I/O ?- avoid nephrotoxic medicaations ?  ?#Chronic pain ?Restarted home oxycodone 15 mg q4hrs per tube. Patient states pain currently at baseline with initiation of med.   ?- oxycodone 15 mg q4hrs  ?  ?Normocytic anemia, stable ?Normocytic anemia of chronic disease. Patient remains asymptomatic today with stable Hgb at 9, No signs of active bleeding. ?-daily CBC ?  ?Leukocytosis; improving ?WBC decreased today to 13.4 from 26.3 (3/14). Values continue to fluctuate since admission, this in the setting of med changes being made to immunosuppressive regimen, as above, and recent procedures could be 2/2 to reactive process. Patient is afebrile and with no active signs of infection at this time. However given immunosuppressive therapy, will CTM for underlying infection, continues to be unlikely at this moment. ?-consider blood cultures,chest xray, and UA if clinically worsened  ?-daily CBC ?  ?T1DM ?Patient had hypoglycemic episode yesterday AM but has now had hyperglycemic episodes with CBGs>400 likely secondary to initiation of continuous tube feeds yesterday. Patient was given 9 units Novolog and CBG continued to be elevated to 518. Additional 9 units Novolog given. Subsequent CBG values remain in the 400s. Starting Novolog 3 units Q4H tube feed coverage, per diabetes  coordinator. ?-Basal insulin (Semglee) increase to 16 units qhs, 4 units short acting Q4 hours while tube feeds running ?- SSI Q4 ?-CBG q4h ?  ?HTN ?Patient with normotensive BPs today 2/2 to restarting oral meds per tube yesterday: amlodipine 10, coreg 25 BID, clonidine 0.1 BID, altace 20 daily. ?-Hydralazine '5mg'$  q4h, PRN SBP >180 ?  ?#Bilateral tree bud opacities of lungs ?CT chest significant for scattered areas of tree bud opacification in both lungs. At least 1 area at the left lung base was present in 2014, suggesting findings relate to chronic pulmonary scarring, unable to rule out low-grade atypical infection. WBC decreased today to 13.4 from 26.3 (3/14). He continues to be afebrile and is not endorsing infectious symptoms today. Given immunosuppressive therapy, will CTM for underlying infection, continues to be unlikely at this moment.  ? ? LOS: 3 days  ? ?Rodriguez-Teodoro, Quintella Reichert, Medical Student ?10/24/2021, 11:37 AM  ? ?Attestation for Student Documentation: ? ?I personally was present and performed or re-performed the history, physical exam and medical decision-making activities of this service and have verified that the service and findings are accurately documented in the student?s note. ? ?Scarlett Presto, MD ?10/24/2021, 2:00 PM ? ?

## 2021-10-24 NOTE — Progress Notes (Signed)
Follow up CBG 466. MD made aware  ? ?CBG 440 at lunch time. MD made aware and SSI and additional 4 units given per orders.  ?

## 2021-10-24 NOTE — Care Management Important Message (Signed)
Important Message ? ?Patient Details  ?Name: Arthur Burke ?MRN: [id number] ?Date of Birth: [birth year] ? ? ?Medicare Important Message Given:  Yes ? ? ? ? ?Levada Dy  Byrd Rushlow-Martin ?10/24/2021, 11:42 AM ?

## 2021-10-24 NOTE — Progress Notes (Addendum)
Inpatient Diabetes Program Recommendations ? ?AACE/ADA: New Consensus Statement on Inpatient Glycemic Control (2015) ? ?Target Ranges:  Prepandial:   less than 140 mg/dL ?     Peak postprandial:   less than 180 mg/dL (1-2 hours) ?     Critically ill patients:  140 - 180 mg/dL  ? ?Lab Results  ?Component Value Date  ? GLUCAP 466 (H) 10/24/2021  ? HGBA1C 8.2 (H) 10/21/2021  ? ? ?Review of Glycemic Control ? Latest Reference Range & Units 10/23/21 20:01 10/23/21 23:30 10/24/21 04:20 10/24/21 08:00 10/24/21 09:56  ?Glucose-Capillary 70 - 99 mg/dL 219 (H) 299 (H) 415 (H) 518 (HH) 466 (H)  ?[facility]): Data is critically high ?(H): Data is abnormally high ? ?Diabetes history: DM1(does not make insulin.  Needs correction, basal and meal coverage) ? ?Outpatient Diabetes medications: Lantus 32 units QHS, Humalog 10 units TID ? ?Current orders for Inpatient glycemic control: Osmolite 1.5 CAL continuous, Novolog 0-9 units Q4H, Semglee 12 units QHS ? ?Inpatient Diabetes Program Recommendations:   ? ?Novolog 3 units Q4H tube feed coverage.  Stop if feeds are held or discontinued. ? ?Adendum'@1700'$ : ? ?Late entry at 75: ?Spoke with dietician student this afternoon.  Patient will be transitioning to bolus feeds at 1700, 0900, 1300, 2100.  Recommend Novolog 4 units with bolus feeds as he does not make insulin.    ? ?Will continue to follow while inpatient. ? ?Thank you, ?Reche Dixon, MSN, RN ?Diabetes Coordinator ?Inpatient Diabetes Program ?[phone] (team pager from 8a-5p) ? ? ? ?

## 2021-10-24 NOTE — Evaluation (Signed)
Physical Therapy Evaluation & Discharge ?Patient Details ?Name: Arthur Burke ?MRN: [id number] ?DOB: 11-11-1965 ?Today's Date: 10/24/2021 ? ?History of Present Illness ? 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 PEG placed 3/13. CT chest showing no evidence of metastatic disease. PMH includes ESRD s/p transplant (left kidney, 2005) on Prograf and low dose prednisone 2/2 diabetic nephropathy, T1DM, HTN, left BKA (2019). ?  ?Clinical Impression ? Patient evaluated by Physical Therapy with no further acute PT needs identified. PTA, pt mod indep with ambulation and wheelchair use, works, does not drive. Today, pt indep ambulating without DME; indep with LLE prosthetic management. Educ re: activity recommendations, importance of mobility; encouraged more frequent hallway ambulation while admitted. All education has been completed and the patient has no further questions. Acute PT is signing off. Thank you for this referral.  ? ?Recommendations for follow up therapy are one component of a multi-disciplinary discharge planning process, led by the attending physician.  Recommendations may be updated based on patient status, additional functional criteria and insurance authorization. ? ?Follow Up Recommendations No PT follow up ? ?  ?Assistance Recommended at Discharge PRN  ?Patient can return home with the following ?   ? ?  ?Equipment Recommendations None recommended by PT  ?Recommendations for Other Services ?    ?  ?Functional Status Assessment Patient has not had a recent decline in their functional status  ? ?  ?Precautions / Restrictions Precautions ?Precautions: Other (comment) ?Precaution Comments: new peg; h/o L BKA (has prosthetic in room) ?Restrictions ?Weight Bearing Restrictions: No  ? ?  ? ?Mobility ? Bed Mobility ?Overal bed  mobility: Independent ?  ?  ?  ?  ?  ?  ?  ?  ? ?Transfers ?Overall transfer level: Independent ?Equipment used: None ?  ?  ?  ?  ?  ?  ?  ?  ?  ? ?Ambulation/Gait ?Ambulation/Gait assistance: Independent ?Gait Distance (Feet): 600 Feet ?Assistive device: IV Pole, None ?Gait Pattern/deviations: Step-through pattern, Decreased stride length ?Gait velocity: Decreased ?  ?  ?General Gait Details: slow, steady gait indep with and without pushing IV pole; no overt instability or LOB ? ?Stairs ?  ?  ?  ?  ?  ? ?Wheelchair Mobility ?  ? ?Modified Rankin (Stroke Patients Only) ?  ? ?  ? ?Balance Overall balance assessment: Modified Independent ?Sitting-balance support: No upper extremity supported ?  ?Sitting balance - Comments: able to don LLE prosthetic and R shoe sitting EOB ?  ?Standing balance support: No upper extremity supported, During functional activity ?Standing balance-Leahy Scale: Good ?  ?  ?  ?  ?  ?  ?  ?  ?  ?  ?  ?  ?   ? ? ? ?Pertinent Vitals/Pain Pain Assessment ?Pain Assessment: Faces ?Faces Pain Scale: Hurts a little bit ?Pain Location: abdomen ?Pain Descriptors / Indicators: Sore ?Pain Intervention(s): Monitored during session  ? ? ?Home Living Family/patient expects to be discharged to:: Private residence ?Living Arrangements: Other relatives ?Available Help at Discharge: Family;Friend(s);Available PRN/intermittently ?Type of Home: House ?Home Access: Level entry ?  ?  ?  ?Home Layout: One level ?Home Equipment: Conservation officer, nature (2 wheels);Shower seat;Wheelchair - manual ?   ?  ?Prior Function Prior Level of Function : Independent/Modified Independent ?  ?  ?  ?  ?  ?  ?  Mobility Comments: Mod indep ambulating with LLE prosthetic and no DME; will hop on RLE with walker when opting not to don prosthetic; also use of manual w/c. Works for [employer] ?ADLs Comments: Mod indep with ADL/iADLs; does not drive ?  ? ? ?Hand Dominance  ?   ? ?  ?Extremity/Trunk Assessment  ? Upper Extremity Assessment ?Upper  Extremity Assessment: Overall WFL for tasks assessed ?  ? ?Lower Extremity Assessment ?Lower Extremity Assessment: LLE deficits/detail;RLE deficits/detail ?RLE Deficits / Details: reports h/o R foot nerve pain; gross strength WFL ?RLE Sensation: history of peripheral neuropathy ?LLE Deficits / Details: h/o L BKA; functional strength > 3/5 throughout ?LLE Sensation: history of peripheral neuropathy ?  ? ?   ?Communication  ? Communication: No difficulties  ?Cognition Arousal/Alertness: Awake/alert ?Behavior During Therapy: [facility] for tasks assessed/performed ?Overall Cognitive Status: Within Functional Limits for tasks assessed ?  ?  ?  ?  ?  ?  ?  ?  ?  ?  ?  ?  ?  ?  ?  ?  ?  ?  ?  ? ?  ?General Comments General comments (skin integrity, edema, etc.): educ on activity recommendations and importance of mobility while admitted ? ?  ?Exercises    ? ?Assessment/Plan  ?  ?PT Assessment Patient does not need any further PT services  ?PT Problem List   ? ?   ?  ?PT Treatment Interventions     ? ?PT Goals (Current goals can be found in the Care Plan section)  ?Acute Rehab PT Goals ?PT Goal Formulation: All assessment and education complete, DC therapy ? ?  ?Frequency   ?  ? ? ?Co-evaluation   ?  ?  ?  ?  ? ? ?  ?AM-PAC PT "6 Clicks" Mobility  ?Outcome Measure Help needed turning from your back to your side while in a flat bed without using bedrails?: None ?Help needed moving from lying on your back to sitting on the side of a flat bed without using bedrails?: None ?Help needed moving to and from a bed to a chair (including a wheelchair)?: None ?Help needed standing up from a chair using your arms (e.g., wheelchair or bedside chair)?: None ?Help needed to walk in hospital room?: None ?Help needed climbing 3-5 steps with a railing? : None ?6 Click Score: 24 ? ?  ?End of Session   ?Activity Tolerance: Patient tolerated treatment well ?Patient left: in chair;with call bell/phone within reach ?Nurse Communication: Mobility  status ?PT Visit Diagnosis: Other abnormalities of gait and mobility (R26.89) ?  ? ?Time: 6468-0321 ?PT Time Calculation (min) (ACUTE ONLY): 28 min ? ? ?Charges:   PT Evaluation ?$PT Eval Moderate Complexity: 1 Mod ?  ?  ?   ? ?Mabeline Caras, PT, DPT ?Acute Rehabilitation Services  ?Pager [phone] ?Office [phone] ? ?Derry Lory ?10/24/2021, 10:26 AM ? ?

## 2021-10-24 NOTE — Progress Notes (Signed)
Nutrition Follow-up ? ?DOCUMENTATION CODES:  ? ?Non-severe (moderate) malnutrition in context of chronic illness ? ?INTERVENTION:  ?Tube feeding via PEG  ?Change to bolus regimen, schedule bolus feeding for 9 AM, 1 PM, 5 PM, 9 PM. ?Osmolite 1.5 360 mL (1.5 cartons) four times daily.  ?Tube feeding regimen provides 2133 kcal, 89 grams of protein and 1083 free water  ? ?Plan to add additional free water flushes to meet hydration needs once hyponatremia corrected  ? ?Dietetic intern spoke to diabetes coordinator regarding change in tube feeding regimen and insulin regimen ? ?Follow up for bolus feeding education  ? ? ?NUTRITION DIAGNOSIS:  ? 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 ? ?Ongoing  ? ?GOAL:  ? ?Patient will meet greater than or equal to 90% of their needs ? ?Progressing; being addressed via PEG tube feeding  ? ?MONITOR:  ? ?PO intake, Supplement acceptance, Labs, Weight trends, Diet advancement ? ?REASON FOR ASSESSMENT:  ? ?Consult ?Enteral/tube feeding initiation and management ? ?ASSESSMENT:  ? 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 ? ?Per chart review, pt's CBG's in 400-500's this AM.  ? ?Met with pt at bedside. Per pt, pt reports tolerating tube feeding well and no complaints of nausea, vomiting or abdominal discomfort. Pt reports that he is almost at goal rate and dietetic intern observed rate at 50 ml/hr during visit.  Discussed with pt the plan to transition him to bolus feedings due to tolerance of continuous tube feeding and pt agreeable to this plan. Dietetic intern to reach out to diabetes coordinator and MD to discuss change in tube feeding regimen and insulin regimen. Dietetic intern to follow up with pt regarding bolus feeding education.  ? ?Labs reviewed and include: CBG's: 41 - 518 x 24 hours, Glucose: 487 (H), A1C: 8.2, Sodium: 126 (L) ? ?Medications reviewed.  ? ? ?Diet Order:   ?Diet Order   ? ?       ?  Diet NPO time specified  Diet effective now       ?  ? ?  ?  ? ?  ? ? ?EDUCATION NEEDS:  ? ?Education needs have been addressed ? ?Skin:  Skin Assessment: Reviewed RN Assessment ? ?Last BM:  3/14 ? ?Height:  ? ?Ht Readings from Last 1 Encounters:  ?10/21/21 '5\' 11"'  (1.803 m)  ? ? ?Weight:  ? ?Wt Readings from Last 1 Encounters:  ?10/24/21 68.4 kg  ? ? ?BMI:  Body mass index is 21.03 kg/m?. ? ?Estimated Nutritional Needs:  ? ?Kcal:  2100 - 2300 ? ?Protein:  105 - 120 grams ? ?Fluid:  >/= 2.1 L ? ? ? ?Maryruth Hancock, Dietetic Intern ?10/24/2021 3:21 PM ?

## 2021-10-24 NOTE — Progress Notes (Signed)
Date and time results received: 10/24/21 0437 ? ?Test: CBG ? ?Critical Value: 415 ? ?Name of Provider Notified: Elmer Sow, MD ?Internal Medicine ? ?Orders Received? Or Actions Taken?: ?Give 9 units Novolog now. ?

## 2021-10-25 DIAGNOSIS — Z940 Kidney transplant status: Secondary | ICD-10-CM | POA: Diagnosis not present

## 2021-10-25 DIAGNOSIS — R1310 Dysphagia, unspecified: Secondary | ICD-10-CM | POA: Diagnosis not present

## 2021-10-25 DIAGNOSIS — E871 Hypo-osmolality and hyponatremia: Secondary | ICD-10-CM | POA: Diagnosis not present

## 2021-10-25 DIAGNOSIS — E860 Dehydration: Secondary | ICD-10-CM | POA: Diagnosis not present

## 2021-10-25 LAB — MAGNESIUM
Magnesium: 2.3 mg/dL (ref 1.7–2.4)
Magnesium: 2.2 mg/dL (ref 1.7–2.4)

## 2021-10-25 LAB — RENAL FUNCTION PANEL
Albumin: 2.9 g/dL — ABNORMAL LOW (ref 3.5–5.0)
Anion gap: 9 (ref 5–15)
BUN: 60 mg/dL — ABNORMAL HIGH (ref 6–20)
CO2: 23 mmol/L (ref 22–32)
Calcium: 8.8 mg/dL — ABNORMAL LOW (ref 8.9–10.3)
Chloride: 98 mmol/L (ref 98–111)
Creatinine, Ser: 1.94 mg/dL — ABNORMAL HIGH (ref 0.61–1.24)
GFR, Estimated: 40 mL/min — ABNORMAL LOW (ref >60)
Glucose, Bld: 324 mg/dL — ABNORMAL HIGH (ref 70–99)
Phosphorus: 2 mg/dL — ABNORMAL LOW (ref 2.5–4.6)
Potassium: 4.8 mmol/L (ref 3.5–5.1)
Sodium: 130 mmol/L — ABNORMAL LOW (ref 135–145)

## 2021-10-25 LAB — CBC
HCT: 31 % — ABNORMAL LOW (ref 39.0–52.0)
Hemoglobin: 10.3 g/dL — ABNORMAL LOW (ref 13.0–17.0)
MCH: 28.9 pg (ref 26.0–34.0)
MCHC: 33.2 g/dL (ref 30.0–36.0)
MCV: 87.1 fL (ref 80.0–100.0)
Platelets: 312 10*3/uL (ref 150–400)
RBC: 3.56 MIL/uL — ABNORMAL LOW (ref 4.22–5.81)
RDW: 14.4 % (ref 11.5–15.5)
WBC: 19.4 10*3/uL — ABNORMAL HIGH (ref 4.0–10.5)
nRBC: 0 % (ref 0.0–0.2)

## 2021-10-25 LAB — GLUCOSE, CAPILLARY
Glucose-Capillary: 293 mg/dL — ABNORMAL HIGH (ref 70–99)
Glucose-Capillary: 248 mg/dL — ABNORMAL HIGH (ref 70–99)
Glucose-Capillary: 311 mg/dL — ABNORMAL HIGH (ref 70–99)
Glucose-Capillary: 398 mg/dL — ABNORMAL HIGH (ref 70–99)
Glucose-Capillary: 377 mg/dL — ABNORMAL HIGH (ref 70–99)

## 2021-10-25 MED ORDER — SODIUM PHOSPHATES 45 MMOLE/15ML IV SOLN
15.0000 mmol | Freq: Once | INTRAVENOUS | Status: DC
  Administered 2021-10-25: 15 mmol via INTRAVENOUS
  Filled 2021-10-25: qty 5

## 2021-10-25 MED ORDER — INSULIN ASPART 100 UNIT/ML IJ SOLN
6.0000 [IU] | Freq: Four times a day (QID) | INTRAMUSCULAR | Status: DC
  Administered 2021-10-25 – 2021-10-26 (×3): 6 [IU] via SUBCUTANEOUS

## 2021-10-25 MED ORDER — CLONIDINE HCL 0.1 MG PO TABS
0.1000 mg | ORAL_TABLET | Freq: Two times a day (BID) | ORAL | Status: DC
  Administered 2021-10-25 – 2021-10-28 (×7): 0.1 mg
  Filled 2021-10-25 (×7): qty 1

## 2021-10-25 MED ORDER — INSULIN GLARGINE-YFGN 100 UNIT/ML ~~LOC~~ SOLN
25.0000 [IU] | Freq: Every day | SUBCUTANEOUS | Status: DC
  Administered 2021-10-25: 25 [IU] via SUBCUTANEOUS
  Filled 2021-10-25 (×2): qty 0.25

## 2021-10-25 MED ORDER — SODIUM CHLORIDE 0.9% FLUSH: 10.0000 mL | INTRAVENOUS | Status: DC | PRN

## 2021-10-25 MED ORDER — CHLORHEXIDINE GLUCONATE CLOTH 2 % EX PADS
6.0000 | MEDICATED_PAD | Freq: Every day | CUTANEOUS | Status: DC
  Administered 2021-10-25 – 2021-10-30 (×6): 6 via TOPICAL

## 2021-10-25 MED ORDER — POTASSIUM & SODIUM PHOSPHATES 280-160-250 MG PO PACK
2.0000 | PACK | Freq: Three times a day (TID) | ORAL | Status: DC
  Administered 2021-10-25 – 2021-10-26 (×4): 2
  Filled 2021-10-25 (×4): qty 2

## 2021-10-25 NOTE — Progress Notes (Signed)
Inpatient Diabetes Program Recommendations ? ?AACE/ADA: New Consensus Statement on Inpatient Glycemic Control (2015) ? ?Target Ranges:  Prepandial:   less than 140 mg/dL ?     Peak postprandial:   less than 180 mg/dL (1-2 hours) ?     Critically ill patients:  140 - 180 mg/dL  ? ?Lab Results  ?Component Value Date  ? GLUCAP 293 (H) 10/25/2021  ? HGBA1C 8.2 (H) 10/21/2021  ? ? ?Review of Glycemic Control ? Latest Reference Range & Units 10/24/21 19:37 10/24/21 23:56 10/25/21 04:33  ?Glucose-Capillary 70 - 99 mg/dL 389 (H) 279 (H) 293 (H)  ?(H): Data is abnormally high ? ?Diabetes history: DM1(does not make insulin.  Needs correction, basal and meal coverage) ?  ?Outpatient Diabetes medications: Lantus 32 units QHS, Humalog 10 units TID ?  ?Current orders for Inpatient glycemic control: Bolus feeds @ 09, 1p, 5p, 9p, Novolog 0-9 units Q4H, Semglee 16 units QHS, Novolog 4 units 4x a day with bolus feeds, Prednisone 5 QAM ? ?Inpatient Diabetes Program Recommendations:   ? ?Semglee 25 units QHS (80% of home dose) ?Novolog 6 units 4 x a day with bolus feeds at 9a, 1p, 5p & 9p ? ?Will continue to follow while inpatient. ? ?Thank you, ?Reche Dixon, MSN, RN ?Diabetes Coordinator ?Inpatient Diabetes Program ?[phone] (team pager from 8a-5p) ? ? ? ? ? ?

## 2021-10-25 NOTE — Evaluation (Signed)
Clinical/Bedside Swallow Evaluation ?Patient Details  ?Name: Arthur Burke ?MRN: [id number] ?Date of Birth: 06-20-1966 ? ?Today's Date: 10/25/2021 ?Time: SLP Start Time (ACUTE ONLY): 1030 SLP Stop Time (ACUTE ONLY): 1100 ?SLP Time Calculation (min) (ACUTE ONLY): 30 min ? ?Past Medical History:  ?Past Medical History:  ?Diagnosis Date  ? Acute osteomyelitis of left calcaneus (HCC)   ? Diabetes mellitus without complication ([place])   ? Diabetic foot infection ([place])   ? Hypertension   ? Infected ulcer of skin, with fat layer exposed ([place])   ? Lower limb ulcer, ankle, left, with necrosis of muscle ([place])   ? MRSA (methicillin resistant staph aureus) culture positive   ? Renal disorder   ? Vision impairment   ? ?Past Surgical History:  ?Past Surgical History:  ?Procedure Laterality Date  ? AMPUTATION Left 08/23/2017  ? Procedure: AMPUTATION BELOW KNEE;  Surgeon: Newt Minion, MD;  Location: [place];  Service: Orthopedics;  Laterality: Left;  ? APPLICATION OF WOUND VAC Left 08/23/2017  ? Procedure: APPLICATION OF WOUND VAC;  Surgeon: Newt Minion, MD;  Location: [place];  Service: Orthopedics;  Laterality: Left;  ? IR GASTROSTOMY TUBE MOD SED  10/22/2021  ? IR IMAGING GUIDED PORT INSERTION  10/22/2021  ? KIDNEY TRANSPLANT  2006  ? [facility]  ? SKIN GRAFT    ? L diabetic foot wound with debriedment and skin grafting  ? TOE AMPUTATION    ? R great and 2nd toe  ? ?HPI:  ?Arthur Burke is an 56 y.o. male has had a lump in his neck for about a year at least.   He is lost about 14 pounds over the past couple of months due to poor appetite, He reported on admit he was having significant trouble swallowing. ENT laryngoscopy on 3/12 found  In the oropharynx there is a large exophytic mass arising from the lateral aspect.  The airway is not obstructed.  The larynx appears clear.  Is very difficult to visualize the cords due to AP collapse of the supraglottic larynx with inspiration he is able to widely open the glottis and there is no obstruction.   The tumor does seem to extend down to the vallecula. ENT recommended tx via chemo/radiation but not surgery. Airway was stable at that time. Underwent biopsy and PEG on 3/13.  ?  ?Assessment / Plan / Recommendation  ?Clinical Impression ? Arthur Burke was seen at bedside for clinical swallow evaluation. Pt presented with ice chips via tsp to slowly integrate PO trials and tolerance. Pt adequately masticated, propelled bolus and initiated pharyngeal swallow sequence with ice chips. No overt s/s of aspiration observed. SLP branched up to individual cup sips of thin water, which did not have an adverse effect on pt performance. Pt continued to not show overt s/s of aspiration with thin liquid sips. SLP cued pt to take larger sips of thin liquids with continued success. When offered puree trials, pt verbalized needing to alternate bites of applesauce followed by thin liquids in order to completely clear applesauce. SLP educated pt on continuing to swallow throughout radiation treatments, even if PO is limited to ice chips. Pt verbalized understanding that throughout future treatments, he needs to exercise swallowing muscles to reduce atrophy. SLP provided education to pt on receiving outpatient SLP services for structured home program and exercises. SLP recommending full thin liquid diet at this time until [facility] can be completed next date. MBS will be used for baseline swallow assessment prior to receiving  radiation treatment. ?SLP Visit Diagnosis: Dysphagia, unspecified (R13.10) ?   ?Aspiration Risk ? Risk for inadequate nutrition/hydration;Moderate aspiration risk  ?  ?Diet Recommendation Thin liquid  ? ?Liquid Administration via: Cup ?Medication Administration: Via alternative means ?Supervision: Patient able to self feed ?Compensations: Small sips/bites ?Postural Changes: Seated upright at 90 degrees  ?  ?Other  Recommendations Oral Care Recommendations: Oral care BID   ? ?Recommendations for follow up therapy are one  component of a multi-disciplinary discharge planning process, led by the attending physician.  Recommendations may be updated based on patient status, additional functional criteria and insurance authorization. ? ?Follow up Recommendations Outpatient SLP  ? ? ?  ?Assistance Recommended at Discharge PRN  ?Functional Status Assessment Patient has had a recent decline in their functional status and demonstrates the ability to make significant improvements in function in a reasonable and predictable amount of time.  ?Frequency and Duration min 2x/week  ?2 weeks ?  ?   ? ?Prognosis Prognosis for Safe Diet Advancement: Fair ?Barriers to Reach Goals: Other (Comment) (Oropharyngeal Cancer)  ? ?  ? ?Swallow Study   ?General HPI: Arthur Burke is an 56 y.o. male has had a lump in his neck for about a year at least.   He is lost about 14 pounds over the past couple of months due to poor appetite, He reported on admit he was having significant trouble swallowing. ENT laryngoscopy on 3/12 found  In the oropharynx there is a large exophytic mass arising from the lateral aspect.  The airway is not obstructed.  The larynx appears clear.  Is very difficult to visualize the cords due to AP collapse of the supraglottic larynx with inspiration he is able to widely open the glottis and there is no obstruction.  The tumor does seem to extend down to the vallecula. ENT recommended tx via chemo/radiation but not surgery. Airway was stable at that time. Underwent biopsy and PEG on 3/13. ?Type of Study: Bedside Swallow Evaluation ?Diet Prior to this Study: Regular;Thin liquids ?Temperature Spikes Noted: No ?Respiratory Status: Room air ?History of Recent Intubation: No ?Behavior/Cognition: Alert;Cooperative;Pleasant mood ?Oral Cavity Assessment: Within Functional Limits ?Oral Care Completed by SLP: No ?Oral Cavity - Dentition: Adequate natural dentition ?Vision: Functional for self-feeding ?Self-Feeding Abilities: Able to feed self ?Patient  Positioning: Upright in bed ?Baseline Vocal Quality: Normal ?Volitional Cough: Strong ?Volitional Swallow: Able to elicit  ?  ?Oral/Motor/Sensory Function Overall Oral Motor/Sensory Function: Within functional limits   ?Ice Chips Ice chips: Within functional limits ?Presentation: Spoon   ?Thin Liquid Thin Liquid: Within functional limits ?Presentation: Cup  ?  ?Nectar Thick Nectar Thick Liquid: Not tested   ?Honey Thick Honey Thick Liquid: Not tested   ?Puree Puree: Within functional limits ?Presentation: Self Fed;Spoon   ?Solid ? ? ?  Solid: Not tested  ? ?  ? ?Vaughan Sine ?10/25/2021,12:23 PM ? ? ? ? ?

## 2021-10-25 NOTE — Progress Notes (Addendum)
? ?Subjective: ? ?Patient wondering if he can use port-a-cath for blood draws. He is feeling well today and pain is well controlled on current regimen. Patient port and tube sites are healing well, G tube site is still sore, but port-a-cath feels better. He would like to go home as soon as possible. States he has been able to swallow some of his spit these past few days and would like to trial liquids.  Had a BM yesterday and feels as though things are moving in right direction. ? ?Objective: ? ?Vital signs in last 24 hours: ?Vitals:  ? 10/24/21 1928 10/24/21 2355 10/25/21 0450 10/25/21 0955  ?BP: (!) 148/70 (!) 152/65 (!) 178/79 129/62  ?Pulse: 61 61 61 62  ?Resp: '18 17 17 18  '$ ?Temp: 98.1 ?F (36.7 ?C) 98 ?F (36.7 ?C) 97.8 ?F (36.6 ?C) 98.4 ?F (36.9 ?C)  ?TempSrc: Oral   Oral  ?SpO2: 99% 96% 98% 98%  ?Weight:      ?Height:      ? ?Weight change:  ? ?Intake/Output Summary (Last 24 hours) at 10/25/2021 1156 ?Last data filed at 10/25/2021 1324 ?Gross per 24 hour  ?Intake 1588.18 ml  ?Output --  ?Net 1588.18 ml  ? ?Physical Exam: ?Constitutional: middle aged man sitting comfortably in bed, alert and responsive, NAD ?HEENT: normocephalic, atraumatic, unchanged non-tender, significant bilateral swelling of proximal neck, airway is patent, rash present on right frontal scalp w/ mild erythema ?Cardio: normal rate and rhythm, systolic murmur heard over P, M, T regions  ?Pulm: no increased work of breathing, CTAB, no wheezes or crackles ?Abd: non-distended, non-tender, +normal bowel sounds ?Extremities: BKA of right foot, AVF L arm  ?Skin: port-a-cath at right IJ; PEG tube at left lower abdomen, both covered with dressing during visit, no pain to palpation of sites, no surrounding erythema.  ?Neuro: alert and oriented x3 ?Psych: normal affect ? ?Assessment/Plan: ? ?Principal Problem: ?  Dehydration with hyponatremia ?Active Problems: ?  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 ? 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 Airway remains patent, patient with improved swallowing ability and would like to trial fluids. Patient was seen at bedside by SLP, recommending full thin liquid diet at this time, MBS to be completed as baseline swallow assessment outpatient prior to radiation treatment. Per oncology, to receive outpatient PET scan once discharged. Biopsies dod cp,e nacl as cancerous as above. ?- full thin liquid diet with continued bolus feeds er tube  ?- appreciate ENT and Oncology recommendations ?- f/u biopsy results ?  ?#Dysphagia ?#Nutrition via tube feeds ?Patient with improved swallowing ability over the past few days, would like to trial fluids. Transitioning to full thin liquid diet at this time, per SLP recs. Patient with decreased phosphorus to 2.0 from 3.0 (3/16), concerning for refeeding syndrome. Was started on IV phosphate 42mol, but given hyperglycemic events, switched to per tube phosphate administration. Will recheck phos level this evening, Mg and K wnl this AM. No changes to bolus feeds at this time. Nutrition to continue to see patient to help with teaching and determine tube feed needs. MBS to be completed as  baseline swallow assessment outpatient prior to radiation treatment.  ?-Monitor magnesium, phosphorus, potassium BID for at least three days ?-Osmolite 1.5 @ 30 ml/hr and advance 10 ml every 8 hours until goal rate of 60 ml/hr. ?-f/u recs from registered dietician, diabetes coordinator  ?  ?#Hyponatremia; improving ?Patient with asymptomatic hyponatremia with Na of  117 on admission likely 2/2 hypovolemic hyponatremia in setting of poor PO intake. Hyponatremia close to fully corrected, improved today with corrected Na in setting of hyperglycemia of 134. No interventions post initiation of tube feeds. He remains euvolemic on exam, will CTM. ?- daily BMP ?  ?#Pre-renal AKI ?Hx of renal failure s/p transplant ?Patient with left kidney transplant in place since 2005, followed by Dr. Susy Frizzle at [facility] nephrology. Pre-renal AKI likely secondary to dehydration and reduced po intake over past two weeks. Renal function stable, creatinine remains below baseline (2-2.2), today at 1.94. Patient now on all immunosuppressive home meds per tube with help from pharmacy for continued monitoring and dosing. ?- continue tacrolimus, mycophenolate, prednisone ?- appreciate continued nephrology recs ?- daily BMP ?- strict I/O ?- avoid nephrotoxic medications ?  ?#Chronic pain ?On home oxycodone 15 mg q4hrs per tube. Patient states pain currently at baseline and manageable with current regimen.   ?- oxycodone 15 mg q4hrs  ?- prn Dilaudid 0.5-'1mg'$  q3hrs for breakthrough pain  ? ?Normocytic anemia, stable ?Normocytic anemia of chronic disease. Patient remains asymptomatic today with stable Hgb at 10.3, No signs of active bleeding. ?-daily CBC ?  ?Leukocytosis; improving ?WBC slightly increased today to 19.4 from 13.4 (3/15). Values continue to fluctuate since admission, this in the setting of med changes being made to immunosuppressive regimen, as above, and recent procedures could be 2/2 to reactive process. Patient is afebrile and with no active signs of infection at this time. However given immunosuppressive therapy, will CTM for underlying infection, continues to be unlikely at this moment. ?-consider blood cultures,chest xray, and UA if clinically worsened  ?-daily CBC ?  ?T1DM ?Patient with CBGs in the 250-300s today. Insulin regimen is still being optimized with initiation of tube feeds, now on  bolus feeds. Increasing basal insulin (Semglee) to 25 units qhs (80% of home dose) and Novolog 6 units 4x/day with bolus feeds. Continue with SSI. ?- Basal insulin (Semglee) increase to 25 units qhs, Novolog 6 units 4x/day with bolus feeds  ?- SSI Q4 ?-CBG q4h ?  ?HTN ?Patient with elevated BPs today, 178/79 this AM. Patient taking clonidine at home in addition to prior meds which had been restarted. Starting clonidine 0.1BID this AM. ?-continue amlodipine 10, coreg 25 BID, altace 20 daily. ?-starting clonidine 0.1 BID ?-Hydralazine '5mg'$  q4h, PRN SBP >180 ?  ?#Bilateral tree bud opacities of lungs ?CT chest significant for scattered areas of tree bud opacification in both lungs. At least 1 area at the left lung base was present in 2014, suggesting findings relate to chronic pulmonary scarring, unable to rule out low-grade atypical infection. WBC decreased today to 13.4 from 26.3 (3/14). He continues to be afebrile and is not endorsing infectious symptoms today. Given immunosuppressive therapy, will CTM for underlying infection, continues to be unlikely at this moment.  ? ? LOS: 4 days  ? ?Arthur Burke, Arthur Burke, Medical Student ?10/25/2021, 11:56 AM  ? ?Attestation for Student Documentation: ? ?I personally was present and performed or re-performed the history, physical exam and medical decision-making activities of this service and have verified that the service and findings are accurately documented in the student?s note. ? ?  Scarlett Presto, MD ?10/25/2021, 3:14 PM  ?

## 2021-10-25 NOTE — Plan of Care (Signed)
  Problem: Education: Goal: Knowledge of General Education information will improve Description: Including pain rating scale, medication(s)/side effects and non-pharmacologic comfort measures Outcome: Progressing   Problem: Health Behavior/Discharge Planning: Goal: Ability to manage health-related needs will improve Outcome: Progressing   Problem: Nutrition: Goal: Adequate nutrition will be maintained Outcome: Progressing   

## 2021-10-25 NOTE — Progress Notes (Signed)
Mobility Specialist: Progress Note ? ? 10/25/21 1728  ?Mobility  ?Activity Ambulated independently in hallway  ?Level of Assistance Independent  ?Assistive Device None  ?Distance Ambulated (ft) 560 ft  ?Activity Response Tolerated well  ?$Mobility charge 1 Mobility  ? ?Received pt in bed having no complaints and agreeable to mobility. Pt able to don prosthesis independently. Asymptomatic throughout ambulation, returned back to chair w/ call bell in reach and all needs met. ? ?Arthur Burke Arthur Burke ?Mobility Specialist ?Mobility Specialist [place]: [phone] ?Mobility Specialist [place]: [phone] ? ?

## 2021-10-25 NOTE — Progress Notes (Addendum)
Nutrition Follow-up ? ?DOCUMENTATION CODES:  ? ?Non-severe (moderate) malnutrition in context of chronic illness ? ?INTERVENTION:  ?-Continue tube feeding via PEG ?Osmolite 1.5 360 mL (1.5 cartons) four times daily (9AM, 1PM, 5PM, 9PM), tube feeding regimen provides 2133 kcal, 89 grams of protein and 1083 free water  ?Provided bolus tube feeding education  ?Dietetic intern spoke to diabetes coordinator via secure chat regarding adjusting insulin regimen ? ? ?NUTRITION DIAGNOSIS:  ? 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 ? ?Ongoing  ? ?GOAL:  ? ?Patient will meet greater than or equal to 90% of their needs ? ?Progressing- via PEG tube, bolus tube feeding  ? ?MONITOR:  ? ?PO intake, Supplement acceptance, Labs, Weight trends, Diet advancement ? ?REASON FOR ASSESSMENT:  ? ?Consult ?Enteral/tube feeding initiation and management ? ?ASSESSMENT:  ? 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 ? ?Spoke with diabetes coordinator via secure chat and discussed insulin regimen recommendations for pt. Per chart review, inpatient diabetes program recommendations:  ?Semglee 25 units QHS (80% of home dose) ?Novolog 6 units 4 x a day with bolus feeds at 9a, 1p, 5p & 9p ? ?Spoke with RN during visit with pt and per RN, she has reached out to transition of care team letting them know that pt will be sent home on bolus tube feeds and will need home tube  feeding supplies sent to pt's home.  ? ?Reached out to outpatient RD's at the [facility] via secure chat regarding pt.  ? ?Met with pt in room and pt sitting up in bed. Per pt, pt has been tolerating the bolus tube feeds well and no complaints of nausea, vomiting, or abdominal discomfort at this time. Pt expresses concern over his blood sugar numbers and reports that it has gone done significantly from yesterday, however, still wanting to make sure he is getting enough insulin coverage for his feeds. Discussed with pt that we reached out to diabetes coordinator in regard to readjusting insulin regimen to make sure insulin is covering his bolus feeding regimen. Discussed with pt that we will be reaching out to the outpatient RD's at the [facility] and update them on him and that they will be following him once he starts treatments at the cancer center.  ? ?Provided bolus tube feeding education for pt at this time. Provided handout from the [employer], "Bolus or Syringe Tube Feeding Instructions," for pt and an additional printed handout for pt regarding his bolus tube feeding regimen and instructions in bolder/bigger font as pt reports difficulty seeing. Discussed pt's bolus tube feeding instructions and daily tube feeding regimen with pt. Discussed with pt how to flush the feeding tube, basic tips for using a feeding tube, how to clean around the feeding tube and how to give bolus (syringe) tube feeds.  ? ?Admit wt: 72 kg ?Current wt: 68.4 kg  ? ?Labs: CBG's: 248 - 518 x 24 hours. Noted that pt's CBG's  were in the 200's this AM.  ? ?Medications reviewed.  ? ?Diet Order:   ?Diet Order   ? ?       ?  Diet NPO time specified  Diet effective now       ?  ? ?  ?  ? ?  ? ? ?EDUCATION NEEDS:  ? ?Education needs have been addressed ? ?Skin:  Skin Assessment: Reviewed RN Assessment ? ?Last BM:  3/15; type 2 ? ?Height:  ? ?Ht Readings from Last 1 Encounters:  ?10/21/21 '5\' 11"'  (1.803 m)   ? ? ?Weight:  ? ?Wt Readings from Last 1 Encounters:  ?10/24/21 68.4 kg  ? ? ? ?BMI:  Body mass index is 21.03 kg/m?. ? ?Estimated Nutritional Needs:  ? ?Kcal:  2100 - 2300 ? ?Protein:  105 - 120 grams ? ?Fluid:  >/= 2.1 L ? ? ? ?Maryruth Hancock, Dietetic Intern ?10/25/2021 10:49 AM ?

## 2021-10-26 ENCOUNTER — Inpatient Hospital Stay (HOSPITAL_COMMUNITY): Payer: Medicare Other

## 2021-10-26 DIAGNOSIS — D638 Anemia in other chronic diseases classified elsewhere: Secondary | ICD-10-CM

## 2021-10-26 DIAGNOSIS — Z930 Tracheostomy status: Secondary | ICD-10-CM

## 2021-10-26 DIAGNOSIS — R918 Other nonspecific abnormal finding of lung field: Secondary | ICD-10-CM

## 2021-10-26 DIAGNOSIS — C4442 Squamous cell carcinoma of skin of scalp and neck: Secondary | ICD-10-CM | POA: Diagnosis not present

## 2021-10-26 DIAGNOSIS — E1065 Type 1 diabetes mellitus with hyperglycemia: Secondary | ICD-10-CM

## 2021-10-26 DIAGNOSIS — R1310 Dysphagia, unspecified: Secondary | ICD-10-CM | POA: Diagnosis not present

## 2021-10-26 DIAGNOSIS — E860 Dehydration: Secondary | ICD-10-CM | POA: Diagnosis not present

## 2021-10-26 DIAGNOSIS — E440 Moderate protein-calorie malnutrition: Secondary | ICD-10-CM

## 2021-10-26 LAB — RENAL FUNCTION PANEL
Albumin: 2.6 g/dL — ABNORMAL LOW (ref 3.5–5.0)
Anion gap: 5 (ref 5–15)
BUN: 65 mg/dL — ABNORMAL HIGH (ref 6–20)
CO2: 27 mmol/L (ref 22–32)
Calcium: 8.5 mg/dL — ABNORMAL LOW (ref 8.9–10.3)
Chloride: 98 mmol/L (ref 98–111)
Creatinine, Ser: 1.71 mg/dL — ABNORMAL HIGH (ref 0.61–1.24)
GFR, Estimated: 47 mL/min — ABNORMAL LOW (ref >60)
Glucose, Bld: 111 mg/dL — ABNORMAL HIGH (ref 70–99)
Phosphorus: 2.2 mg/dL — ABNORMAL LOW (ref 2.5–4.6)
Potassium: 4.9 mmol/L (ref 3.5–5.1)
Sodium: 130 mmol/L — ABNORMAL LOW (ref 135–145)

## 2021-10-26 LAB — GLUCOSE, CAPILLARY
Glucose-Capillary: 103 mg/dL — ABNORMAL HIGH (ref 70–99)
Glucose-Capillary: 316 mg/dL — ABNORMAL HIGH (ref 70–99)
Glucose-Capillary: 359 mg/dL — ABNORMAL HIGH (ref 70–99)
Glucose-Capillary: 371 mg/dL — ABNORMAL HIGH (ref 70–99)
Glucose-Capillary: 397 mg/dL — ABNORMAL HIGH (ref 70–99)
Glucose-Capillary: 50 mg/dL — ABNORMAL LOW (ref 70–99)
Glucose-Capillary: 53 mg/dL — ABNORMAL LOW (ref 70–99)
Glucose-Capillary: 87 mg/dL (ref 70–99)
Glucose-Capillary: 94 mg/dL (ref 70–99)

## 2021-10-26 LAB — MAGNESIUM: Magnesium: 2 mg/dL (ref 1.7–2.4)

## 2021-10-26 MED ORDER — INSULIN ASPART 100 UNIT/ML IJ SOLN
4.0000 [IU] | Freq: Four times a day (QID) | INTRAMUSCULAR | Status: DC
  Administered 2021-10-26 (×2): 4 [IU] via SUBCUTANEOUS

## 2021-10-26 MED ORDER — SODIUM PHOSPHATES 45 MMOLE/15ML IV SOLN
15.0000 mmol | Freq: Once | INTRAVENOUS | Status: AC
  Administered 2021-10-26: 15 mmol via INTRAVENOUS
  Filled 2021-10-26: qty 5

## 2021-10-26 MED ORDER — INSULIN ASPART 100 UNIT/ML IJ SOLN
0.0000 [IU] | Freq: Three times a day (TID) | INTRAMUSCULAR | Status: DC
  Administered 2021-10-27: 3 [IU] via SUBCUTANEOUS

## 2021-10-26 MED ORDER — INSULIN GLARGINE-YFGN 100 UNIT/ML ~~LOC~~ SOLN
20.0000 [IU] | Freq: Every day | SUBCUTANEOUS | Status: DC
  Administered 2021-10-26 – 2021-10-27 (×2): 20 [IU] via SUBCUTANEOUS
  Filled 2021-10-26 (×3): qty 0.2

## 2021-10-26 NOTE — Progress Notes (Addendum)
Nutrition Follow-up ? ?DOCUMENTATION CODES:  ? ?Non-severe (moderate) malnutrition in context of chronic illness ? ?INTERVENTION:  ?Continue tube feeding via PEG ?Osmolite 1.5 360 mL (1.5 cartons) four times daily (9AM, 1PM, 5PM, 9PM), tube feeding regimen provides 2133 kcal, 89 grams of protein and 1083 free water  ? ?Provided bolus tube feeding education handouts ? ?Encouraged pt to provide bolus tube feedings on own  ? ?Reached out to diabetes coordinator via secure chat and pager regarding insulin recommendations for PO intake outside of bolus tube feeding times ? ?Reached out to MD regarding insulin coverage for full liquid meal trays and MD agreeable and will readjust insulin regimen  ? ?NUTRITION DIAGNOSIS:  ? 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 ? ?Ongoing  ? ?GOAL:  ? ?Patient will meet greater than or equal to 90% of their needs ? ?Met - addressed via bolus tube feeding via PEG  ? ?MONITOR:  ? ?PO intake, Supplement acceptance, Labs, Weight trends, Diet advancement ? ?REASON FOR ASSESSMENT:  ? ?Consult ?Enteral/tube feeding initiation and management ? ?ASSESSMENT:  ? 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 ? ?3/16 - Diet advanced to full liquid  ?3/17 - MBS  ? ?Per SLP diet recommendation after MBS, recommend regular solids, thin liquids.  ? ?Spoke with MD regarding pt needing insulin coverage for full liquid meals and MD  to make this change and adjust insulin regimen.  ? ?Met with pt at bedside. Pt reports tolerating bolus tube feedings well and no complaints of nausea, vomiting or abdominal discomfort at this time. Pt reports that the nurse has been coming in and doing his bolus tube feedings at this time. Pt reports that he had a meal tray sent up to his room last night for dinner and that he ate 100% of the reduced fat milk, coffee with cream and splenda, chocolate pudding, and chicken broth. Pt reports that he has not had a tray sent up this morning yet. Pt also reports drinking two styrofoam cups of water this morning and has been tolerating full liquids well. Discussed with pt his plan at home regarding PO intake and pt reports that he thinks that he will be able to tolerate items such as puddings, peaches, and juices as he was prior to admission. Pt reports that he typically drinks apple juice, pineapple juice and diet pepsi at home. Prior to admission, pt reports that he typically checks his blood sugar six times a day and that he hopes that he can discuss with someone about getting a continuous monitor. Pt also reports that he pays close attention to how he feels and typically knows when his blood sugar levels are too high or too low. Pt also discussed willingness to switch to sugar-free items such as sugar-free pudding and sugar-free juices.  ? ?Insulin regimen adjusted yesterday prior to diet advancement. Currently no insulin ordered with full liquid meals. Pt has DM type I and needs insulin coverage for full liquid  meals.  ? ?Discussed with pt his insulin regimen at home regarding insulin coverage for bolus feeds and PO intake. Discussed alternative sugar-free food options with pt and pt agreeable to making these changes at home. Pt planning to speak to transitions of care regarding home tube feeding supplies. ? ?Labs reviewed and include:  ?CBG's: 53 - 398 x 24 hours, Na: 130 (L), Phosphorus: 2.2 (L)  ? ?Pt's phosphorus  is low and pt provided sodium phosphate to replete.  ? ?Medications reviewed and include:  ? docusate  100 mg Per Tube BID  ? feeding supplement (OSMOLITE 1.5 CAL)  360 mL Per Tube QID  ? furosemide  40 mg Per Tube BID  ? insulin aspart  0-9 Units Subcutaneous Q4H  ? insulin aspart  4 Units Subcutaneous QID  ? insulin glargine-yfgn  20 Units Subcutaneous QHS  ? predniSONE  5 mg Per Tube Q breakfast  ? ramipril  20 mg Per Tube Daily  ? ? ?Diet Order:   ?Diet Order   ? ?       ?  Diet full liquid Room service appropriate? Yes; Fluid consistency: Thin  Diet effective now       ?  ? ?  ?  ? ?  ? ? ?EDUCATION NEEDS:  ? ?Education needs have been addressed ? ?Skin:  Skin Assessment: Reviewed RN Assessment ? ?Last BM:  3/16; type 2 ? ?Height:  ? ?Ht Readings from Last 1 Encounters:  ?10/21/21 '5\' 11"'  (1.803 m)  ? ? ?Weight:  ? ?Wt Readings from Last 1 Encounters:  ?10/24/21 68.4 kg  ? ? ?BMI:  Body mass index is 21.03 kg/m?. ? ? ?Estimated Nutritional Needs:  ? ?Kcal:  2100 - 2300 ? ?Protein:  105 - 120 grams ? ?Fluid:  >/= 2.1 L ? ? ? ?Maryruth Hancock, Dietetic Intern ?10/26/2021 4:35 PM ?

## 2021-10-26 NOTE — Progress Notes (Signed)
Mobility Specialist: Progress Note ? ? 10/26/21 1728  ?Mobility  ?Activity Ambulated independently in hallway  ?Level of Assistance Independent  ?Assistive Device  ?(IV pole)  ?Distance Ambulated (ft) 650 ft  ?Activity Response Tolerated well  ?$Mobility charge 1 Mobility  ? ?Received pt in bed having no complaints and agreeable to mobility. Pt independent with donning prosthesis. Asymptomatic throughout ambulation. To BR after returning to the room, then returned sitting EOB w/ call bell in reach and all needs met. ? ?Arthur Burke ?Mobility Specialist ?Mobility Specialist [place]: [phone] ?Mobility Specialist [place]: [phone] ? ?

## 2021-10-26 NOTE — Progress Notes (Signed)
? ?Subjective: ?No acute events overnight.  G-tube site still sore, pain adequately managed on current regimen.  Denies nausea, vomiting, abdominal pain.  Reports tolerated p.o. chicken broth last night.  Felt hypoglycemic (weak, hot) this morning when his CBGs came back at 50 and 53.  Improved with juice.  Discussed poorly controlled blood sugars as barrier to discharge.  Updated patient on pathology results. ? ?Objective: ? ?Vital signs in last 24 hours: ?Vitals:  ? 10/25/21 2124 10/26/21 0016 10/26/21 0413 10/26/21 0751  ?BP: (!) 150/71 131/67 137/68 (!) 171/74  ?Pulse: 60 62 62 60  ?Resp: 19   18  ?Temp: 97.8 ?F (36.6 ?C)   98.2 ?F (36.8 ?C)  ?TempSrc: Oral   Oral  ?SpO2: 98% 98% 100% 99%  ?Weight:      ?Height:      ? ?Weight change:  ? ?Intake/Output Summary (Last 24 hours) at 10/26/2021 1325 ?Last data filed at 10/26/2021 1300 ?Gross per 24 hour  ?Intake 1080 ml  ?Output --  ?Net 1080 ml  ? ?Physical Exam: ?Constitutional: middle aged man sitting comfortably in bed, alert and responsive, NAD ?HEENT: normocephalic, atraumatic, unchanged non-tender, significant bilateral swelling of proximal neck, airway is patent, rash present on right frontal scalp w/ mild erythema ?Cardio: normal rate and rhythm, systolic murmur heard over P, M, T regions  ?Pulm: no increased work of breathing, CTAB, no wheezes or crackles ?Abd: non-distended, non-tender, +normal bowel sounds ?Extremities: BKA of right foot, AVF L arm  ?Skin: port-a-cath at right IJ; PEG tube at left lower abdomen, both covered with dressing during visit, no pain to palpation of sites, no surrounding erythema.  ?Neuro: alert and oriented x3 ?Psych: normal affect ? ?Assessment/Plan: ? ?Principal Problem: ?  Dehydration with hyponatremia ?Active Problems: ?  History of kidney transplant ?  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  Dysphagia ?  Malnutrition of moderate degree ?  Type 1 diabetes mellitus with hyperglycemia (HCC) ? ?Mr. 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 ? 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 Airway remains patent, tolerating for liquid diet. MBS to be completed as baseline swallow assessment outpatient prior to radiation treatment. Per oncology, to receive outpatient PET scan once discharged. Biopsies confirm squamous cell carcinoma. ?- full thin liquid diet with continued bolus feeds per tube  ?-Appreciate ENT and Oncology recommendations ?-Outpatient follow-up with oncology as above ?  ?#Dysphagia ?#Nutrition via tube feeds ?#Moderate malnutrition ?Patient n.p.o. due to severe dysphagia on admission.  Given poor p.o. intake concern for refeeding syndrome.  Monitoring P, K, Mg twice daily day 3/3. Required repletion of phosphorus.  Tolerating bolus tube feeds. Nutrition to continue to see patient to help with teaching and determine tube feed needs. MBS to be completed as baseline swallow assessment outpatient prior to radiation treatment.  ?-Monitor magnesium, phosphorus, potassium BID for at least three days, day 3/3 ?-Osmolite 1.5 @ 30 ml/hr and advance 10 ml every 8 hours until goal rate of 60 ml/hr ?-Full thin liquid diet meal 3 times daily ?-f/u recs from registered dietician ?-TOC to assist with setting up tube feeds at home ?  ?#Hyponatremia; improving ?Patient with asymptomatic  hyponatremia with Na of 117 on admission likely 2/2 hypovolemic hyponatremia in setting of poor PO intake. Hyponatremia close to fully corrected, improved today with corrected Na in setting of hyperglycemia of 134. No interventions post initiation of tube feeds. He  remains euvolemic on exam, will CTM. ?- daily BMP ?  ?#Pre-renal AKI, resolved ?#Hx of renal failure s/p transplant ?Patient with left kidney transplant in place since 2005, followed by Dr. Susy Frizzle at [facility] nephrology. Pre-renal AKI likely secondary to dehydration and reduced po intake over past two weeks. Renal function stable, creatinine remains below baseline (2-2.2). Patient now on all immunosuppressive home meds per tube with help from pharmacy for continued monitoring and dosing.  Reports good UOP, not documented by RNs. ?- continue tacrolimus, mycophenolate, prednisone ?- appreciate continued nephrology recs ?- daily BMP ?- strict I/O ?- avoid nephrotoxic medications ?  ?#Chronic pain ?On home oxycodone 15 mg q4hrs per tube. Patient states pain currently at baseline and manageable with current regimen.   ?- oxycodone 15 mg q4hrs  ?- prn Dilaudid 0.5-'1mg'$  q3hrs for breakthrough pain  ? ?#Chronic normocytic anemia, stable ?#Anemia of chronic disease ?Patient remains asymptomatic today with stable Hgb at 10.3, No signs of active bleeding. ?-daily CBC, CTM ?  ?Leukocytosis; improving ?WBC slightly increased to 19.4 from 13.4 (3/15). Values continue to fluctuate in the setting of med changes being made to immunosuppressive regimen, as above, versus reactive secondary to recent procedures. Patient is afebrile and with no active signs of infection at this time. However given immunosuppressive therapy, will CTM for underlying infection, continues to be unlikely at this moment. ?-consider blood cultures,chest xray, and UA if clinically worsened  ?-daily CBC ?  ?#T1DM ?Patient with CBGs 300s overnight and decreased significantly down to 50s this morning following administration of long-acting insulin which was increased yesterday.  Improved following juice consumption to 80s 90s.  Insulin regimen is still being optimized following initiation of tube feeds, now on bolus feeds. Decreasing basal insulin (Semglee) to 20  units qhs and Novolog 4 units 4x/day with bolus feeds. Continue with SSI.  Patient needs to continue to work with his PCP to obtain CGM for outpatient glucose monitoring. ?- Basal insulin (Semglee) increase to 25 units qhs, Novolog 6 units 4x/day with bolus feeds  ?- SSI Q4 ?-CBG q4h ?  ?#HTN ?Patient with high normal BPs today. Home medications: Amlodipine, Coreg, Altace, clonidine. ?-continue amlodipine 10, coreg 25 BID, altace 20 daily ?-Continue clonidine 0.1 BID ?-Hydralazine '5mg'$  q4h, PRN SBP >180 ?  ?#Bilateral tree bud opacities of lungs ?CT chest significant for scattered areas of tree bud opacification in both lungs. At least 1 area at the left lung base was present in 2014, suggesting findings relate to chronic pulmonary scarring, unable to rule out low-grade atypical infection. WBC decreased today to 13.4 from 26.3 (3/14). He continues to be afebrile and is not endorsing infectious symptoms today. Given immunosuppressive therapy, will CTM for underlying infection, continues to be unlikely at this moment.  ? ?Diet: Full liquid, thins with bolus tube feeds ?VTE: Lovenox ?IVF: None ?Code: Full ? ?Portions of this report may have been transcribed using voice recognition software. Every effort was made to ensure accuracy; however, inadvertent computerized transcription errors may be present.  ? ?Wayland Denis, MD ?10/26/21,  1:48 PM ?Pager: [phone] ?Internal Medicine Resident, PGY-1 ?[facility]  ?  ? ? ? ?

## 2021-10-26 NOTE — Progress Notes (Signed)
Modified Barium Swallow Progress Note ? ?Patient Details  ?Name: Arthur Burke ?MRN: [id number] ?Date of Birth: Dec 24, 1965 ? ?Today's Date: 10/26/2021 ? ?Modified Barium Swallow completed.  Full report located under Chart Review in the Imaging Section. ? ?Brief recommendations include the following: ? ?Clinical Impression ? Pt seen for MBS for instrumental assessment of baseline swallow function in preparation of radiation to treat oropharyngeal cancer. Pt demonstrates visible tissue abnormality around the valleculae and epiglottis. There is trace residue with liquids and solids in the vallecular space after the swallow. Otherwise the hyolaryngeal mechanism is strong and timely. Pt clears any solids well with a liquid wash. Pill transits with thin liquids without difficulty. Esophageal sweep appears WNL. Pt currently able to consume regular solids and thin liquids. SLP repeated some expectations about swallowing during radiation treatment, emphasizing the potential for increased dysphagia and the importance of following up with SLP for HEP and to address challenges with swallowing and the continued use of the swallowing mechanism during radiation to prevent fibrosis. ?  ?Swallow Evaluation Recommendations ? ?   ? ? SLP Diet Recommendations: Regular solids;Thin liquid ? ? Liquid Administration via: Cup;Straw ? ? Medication Administration: Whole meds with liquid ? ? Supervision: Patient able to self feed ? ? Compensations: Small sips/bites ? ? Postural Changes: Remain semi-upright after after feeds/meals (Comment) ? ? Oral Care Recommendations: Oral care BID ? ?   ? ? ? ?Arthur Burke, Arthur Burke ?10/26/2021,1:56 PM ?

## 2021-10-26 NOTE — TOC Progression Note (Addendum)
Transition of Care (TOC) - Progression Note  ? ? ?Patient Details  ?Name: Arthur Burke ?MRN: [id number] ?Date of Birth: [birth year] ? ?Transition of Care (TOC) CM/SW Contact  ?Sharin Mons, RN ?Phone Number: ?10/26/2021, 2:51 PM ? ?Clinical Narrative:    ?Consult received : Will require tube feeds at home. ?NCM spoke with pt regarding d/c planning. Pt states bolus TF has been taught by nursing staff and states understanding of teaching. Pt agreeable to home health services. Choice provided. Pt without preference. Referral made with [facility] for home health RN,SW services and accepted. DME : TF, referral made with [facility] and accepted. Per Thedore Mins pt will need 3 day supply of TF to go home with @ d/c, NCM made bedside nurse aware. ? ?TOC team will continue to monitor and assist with needs.... ? ? ?  ?Barriers to Discharge: Continued Medical Work up ? ?Expected Discharge Plan and Services ?  ?  ?Discharge Planning Services: CM Consult ?  ?Living arrangements for the past 2 months: Apartment ?                ?DME Arranged: Other see comment ([facility] TF) ?DME Agency: [facility] ?Date DME Agency Contacted: 10/26/21 ?Time DME Agency Contacted: [id number] ?Representative spoke with at DME Agency: Thedore Mins ?HH Arranged: Therapist, sports, Social Work ?[place] Agency: [facility] ?Date HH Agency Contacted: 10/26/21 ?Time [place]: 0092 ?Representative spoke with at [place]: Anderson Malta ? ? ?Social Determinants of Health (SDOH) Interventions ?  ? ?Readmission Risk Interventions ?No flowsheet data found. ? ?

## 2021-10-27 LAB — CBC
HCT: 27.5 % — ABNORMAL LOW (ref 39.0–52.0)
Hemoglobin: 9.1 g/dL — ABNORMAL LOW (ref 13.0–17.0)
MCH: 29 pg (ref 26.0–34.0)
MCHC: 33.1 g/dL (ref 30.0–36.0)
MCV: 87.6 fL (ref 80.0–100.0)
Platelets: 251 10*3/uL (ref 150–400)
RBC: 3.14 MIL/uL — ABNORMAL LOW (ref 4.22–5.81)
RDW: 14.4 % (ref 11.5–15.5)
WBC: 17.9 10*3/uL — ABNORMAL HIGH (ref 4.0–10.5)
nRBC: 0 % (ref 0.0–0.2)

## 2021-10-27 LAB — BASIC METABOLIC PANEL
Anion gap: 5 (ref 5–15)
BUN: 61 mg/dL — ABNORMAL HIGH (ref 6–20)
CO2: 29 mmol/L (ref 22–32)
Calcium: 8.6 mg/dL — ABNORMAL LOW (ref 8.9–10.3)
Chloride: 94 mmol/L — ABNORMAL LOW (ref 98–111)
Creatinine, Ser: 1.52 mg/dL — ABNORMAL HIGH (ref 0.61–1.24)
GFR, Estimated: 54 mL/min — ABNORMAL LOW (ref >60)
Glucose, Bld: 228 mg/dL — ABNORMAL HIGH (ref 70–99)
Potassium: 5 mmol/L (ref 3.5–5.1)
Sodium: 128 mmol/L — ABNORMAL LOW (ref 135–145)

## 2021-10-27 LAB — GLUCOSE, CAPILLARY
Glucose-Capillary: 122 mg/dL — ABNORMAL HIGH (ref 70–99)
Glucose-Capillary: 140 mg/dL — ABNORMAL HIGH (ref 70–99)
Glucose-Capillary: 199 mg/dL — ABNORMAL HIGH (ref 70–99)
Glucose-Capillary: 218 mg/dL — ABNORMAL HIGH (ref 70–99)
Glucose-Capillary: 238 mg/dL — ABNORMAL HIGH (ref 70–99)
Glucose-Capillary: 240 mg/dL — ABNORMAL HIGH (ref 70–99)
Glucose-Capillary: 318 mg/dL — ABNORMAL HIGH (ref 70–99)
Glucose-Capillary: 39 mg/dL — CL (ref 70–99)
Glucose-Capillary: 43 mg/dL — CL (ref 70–99)

## 2021-10-27 MED ORDER — INSULIN ASPART 100 UNIT/ML IJ SOLN
0.0000 [IU] | INTRAMUSCULAR | Status: DC
  Administered 2021-10-27: 3 [IU] via SUBCUTANEOUS
  Administered 2021-10-27: 1 [IU] via SUBCUTANEOUS
  Administered 2021-10-28: 4 [IU] via SUBCUTANEOUS
  Administered 2021-10-28: 3 [IU] via SUBCUTANEOUS
  Administered 2021-10-28: 2 [IU] via SUBCUTANEOUS

## 2021-10-27 MED ORDER — INSULIN ASPART 100 UNIT/ML IJ SOLN: 0.0000 [IU] | Freq: Three times a day (TID) | INTRAMUSCULAR | Status: DC

## 2021-10-27 NOTE — Progress Notes (Signed)
Mobility Specialist Progress Note  ? ? 10/27/21 1211  ?Mobility  ?Activity Ambulated independently in hallway  ?Level of Assistance Standby assist, set-up cues, supervision of patient - no hands on  ?Assistive Device None  ?Distance Ambulated (ft) 450 ft  ?Activity Response Tolerated well  ?$Mobility charge 1 Mobility  ? ?Pt received in chair and agreeable. C/o pain but no rating given, LPN notified. Returned to bed with call bell in reach.  ? ?Arthur Burke ?Mobility Specialist  ?  ?

## 2021-10-27 NOTE — Plan of Care (Signed)
  Problem: Nutrition: Goal: Adequate nutrition will be maintained Outcome: Progressing   Problem: Pain Managment: Goal: General experience of comfort will improve Outcome: Progressing   Problem: Safety: Goal: Ability to remain free from injury will improve Outcome: Progressing   

## 2021-10-27 NOTE — Progress Notes (Addendum)
? ?Subjective: ? ?CBG down to 43 this AM during rounds. Was in 240 around 4AM and received 3u short acting Novolog. Patient shivering in bed, states he was feeling cold thinks its due to low sugars, doesn't usually feel this way. Usually feels warmer when sugars are low. ? ?He checks his sugars often at home and is able to adjust home insulin as needed. Would like to get continuous glucose monitoring and would consider insulin pump. Patient otherwise doing well states he has had regular bowel movements and thinks tube feeds are going well. ? ?Objective: ? ?Vital signs in last 24 hours: ?Vitals:  ? 10/26/21 1600 10/26/21 2020 10/26/21 2315 10/27/21 0835  ?BP: 136/69 140/63 (!) 125/58 (!) 199/82  ?Pulse: 62 61 87 79  ?Resp: '18 18 17 18  '$ ?Temp: (!) 97.5 ?F (36.4 ?C) 97.7 ?F (36.5 ?C) 98 ?F (36.7 ?C) 98 ?F (36.7 ?C)  ?TempSrc: Oral Oral Oral   ?SpO2: 92% 99% 99% 100%  ?Weight:      ?Height:      ? ?Weight change:  ? ?Intake/Output Summary (Last 24 hours) at 10/27/2021 1045 ?Last data filed at 10/26/2021 1300 ?Gross per 24 hour  ?Intake 240 ml  ?Output --  ?Net 240 ml  ? ?Constitutional: middle aged man sitting up in bed, shivering from cold and low blood sugars, alert and responsive, NAD ?HEENT: normocephalic, atraumatic, unchanged non-tender, significant bilateral swelling of proximal neck, airway is patent, rash present on right frontal scalp w/ mild erythema ?Cardio: normal rate and rhythm, systolic murmur heard over P, M, T regions  ?Pulm: no increased work of breathing on room air ?Abd: non-distended, non-tender, +normal bowel sounds ?Extremities: BKA of right foot, AVF L arm  ?Skin: port-a-cath at right IJ; PEG tube at left lower abdomen, both covered with dressing during visit, no pain to palpation of sites, no surrounding erythema.  ?Neuro: alert and oriented x3 ?Psych: normal affect ? ?Assessment/Plan: ? ?Principal Problem: ?  Dehydration with hyponatremia ?Active Problems: ?  History of kidney transplant ?   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  Dysphagia ?  Malnutrition of moderate degree ?  Type 1 diabetes mellitus with hyperglycemia (HCC) ? ?[name] a 56 yo with a pmhx significant for ESRD s/p transplant (left kidney, 2005) 2/2 diabetic nephropathy, T1DM, HTN, left BKA presenting with dysphagia causing poor PO intake found to have an oropharyngeal tumor concerning for squamous cell carcinoma and was admitted for biopsy and management of hypovolemic hyponatremia and an AKI. ?  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 Airway remains patent, tolerating liquid diet well. MBS completed for baseline swallow assessment prior to radiation treatment, as below. Biopsies confirm squamous cell carcinoma. Per oncology, to receive outpatient PET scan once discharged.  ?- full thin liquid diet with continued bolus feeds per tube  ?- Appreciate ENT and Oncology recommendations ?- Outpatient follow-up with oncology as above ?  ?#Dysphagia ?#Nutrition via tube feeds ?#Moderate malnutrition ?Patient n.p.o. due to severe dysphagia on admission.  Given poor p.o. intake, monitored for refeeding syndrome which required repletion of phosphorus.  Tolerating bolus tube feeds. MBS completed for baseline swallow assessment prior to radiation treatment yesterday. Patient currently able to consume regular solids and thin liquids. He was counseled on potential for increased dysphagia giving impending radiation therapy. Nutrition to continue to see patient to help with teaching and determine tube feed needs.  ?-Osmolite 1.5 @ 30 ml/hr and  advance 10 ml every 8 hours until goal rate of 60 ml/hr ?-Full thin liquid diet meal 3 times daily ?-f/u recs from registered dietician ?-TOC to assist with setting up tube feeds at home ?  ?#Hyponatremia; improving ?Patient with asymptomatic hyponatremia with Na of 117 on admission likely 2/2 hypovolemic  hyponatremia in setting of poor PO intake. Hyponatremia stable today with corrected Na in setting of hyperglycemia of 130. No interventions post initiation of tube feeds. He remains euvolemic on exam, will CTM. ?- daily BMP ?  ?#Pre-renal AKI, resolved ?#Hx of renal failure s/p transplant ?Patient with left kidney transplant in place since 2005, followed by Dr. Susy Frizzle at [facility] nephrology. Pre-renal AKI likely secondary to dehydration and reduced po intake over past two weeks. Renal function stable, creatinine remains below baseline (2-2.2). Patient now on all immunosuppressive home meds per tube with help from pharmacy for continued monitoring and dosing.  ?- continue tacrolimus, mycophenolate, prednisone ?- appreciate continued nephrology recs ?- daily BMP ?- strict I/O ?- avoid nephrotoxic medications ?  ?#Chronic pain ?On home oxycodone 15 mg q4hrs per tube. Patient states pain currently at baseline and manageable with current regimen.   ?- oxycodone 15 mg q4hrs  ?- prn Dilaudid 0.5-'1mg'$  q3hrs for breakthrough pain  ?  ?#Chronic normocytic anemia, stable ?#Anemia of chronic disease ?Patient remains asymptomatic today with stable Hgb in the 9-10s since admission, No signs of active bleeding. ?-daily CBC, CTM ?  ?Leukocytosis; improving ?WBC decreased to 17.9 from 19.4 (3/17). Values continue to fluctuate in the setting of  immunosuppressive therapy, as above, versus reactive secondary to recent procedures. Patient is afebrile and with no active signs of infection at this time. However given immunosuppressive therapy, will CTM for underlying infection, continues to be unlikely at this moment. ?-consider blood cultures,chest xray, and UA if clinically worsened  ?-daily CBC ?  ?#T1DM ?Patient with CBGs of 240 around 4am this morning, he was given 3units Novolog. On rounds, patient found to be shivering with CBG of 43 around 8am. Patient was given juice with subsequent CBG at 122. Given hypoglycemic event yesterday,  will not make changes to long acting basal insulin today. Patient currently on SSI for both tube feeds and snack. Will change both to sensitive SSI and advice team to hold on aggressive corrections, given labile CBGs would prefer that he run high vs low.  Insulin regimen is still being optimized following initiation of bolus tube feeds, now also consuming po liquids. Will also refer patient to Endocrinologist in [place], will need close follow up on discharge. ?-Continue basal insulin (Semglee) to 20 units qhs and Novolog 4 units 4x/day with bolus feeds.  ?-Switch to sensitive SSI.  ?-Patient to be referred to Endocrinologist in [place], needs close follow up to optimize insulin regimen and work to obtain CGM/insulin pump  ?- SSI Q4 ?-CBG q4h ?  ?#HTN ?Patient with recorded elevated BP this AM to 199/82 suspect this is incorrect reading as bps have remained in high normal range since starting home meds. Will recheck and address as necessary.  Home medications: Amlodipine, Coreg, Altace, clonidine. ?-continue amlodipine 10, coreg 25 BID, altace 20 daily, clonidine 0.1 BID ?-Hydralazine '5mg'$  q4h, PRN SBP >180 ?  ?#Bilateral tree bud opacities of lungs ?CT chest significant for scattered areas of tree bud opacification in both lungs. At least 1 area at the left lung base was present in 2014, suggesting findings relate to chronic pulmonary scarring, unable to rule out low-grade atypical infection. WBC decreased  today to 13.4 from 26.3 (3/14). He continues to be afebrile and is not endorsing infectious symptoms today. Given immunosuppressive therapy, will CTM for underlying infection, continues to be unlikely at this moment.  ? ? ? LOS: 6 days  ? ?Arthur Burke, Arthur Burke, Medical Student ?10/27/2021, 10:45 AM  ? ? ?Attestation for Student Documentation: ? ?I personally was present and performed or re-performed the history, physical exam and medical decision-making activities of this service and have verified that the service and  findings are accurately documented in the student?s note. ? ?Virl Axe, MD ?10/27/2021, 5:17 PM ? ?

## 2021-10-28 DIAGNOSIS — C4442 Squamous cell carcinoma of skin of scalp and neck: Secondary | ICD-10-CM | POA: Diagnosis not present

## 2021-10-28 DIAGNOSIS — E440 Moderate protein-calorie malnutrition: Secondary | ICD-10-CM | POA: Diagnosis not present

## 2021-10-28 DIAGNOSIS — R1310 Dysphagia, unspecified: Secondary | ICD-10-CM | POA: Diagnosis not present

## 2021-10-28 DIAGNOSIS — E860 Dehydration: Secondary | ICD-10-CM | POA: Diagnosis not present

## 2021-10-28 LAB — MAGNESIUM: Magnesium: 1.7 mg/dL (ref 1.7–2.4)

## 2021-10-28 LAB — GLUCOSE, CAPILLARY
Glucose-Capillary: 316 mg/dL — ABNORMAL HIGH (ref 70–99)
Glucose-Capillary: 233 mg/dL — ABNORMAL HIGH (ref 70–99)
Glucose-Capillary: 370 mg/dL — ABNORMAL HIGH (ref 70–99)
Glucose-Capillary: 264 mg/dL — ABNORMAL HIGH (ref 70–99)
Glucose-Capillary: 411 mg/dL — ABNORMAL HIGH (ref 70–99)

## 2021-10-28 LAB — RENAL FUNCTION PANEL
Albumin: 2.3 g/dL — ABNORMAL LOW (ref 3.5–5.0)
Anion gap: 5 (ref 5–15)
BUN: 59 mg/dL — ABNORMAL HIGH (ref 6–20)
CO2: 28 mmol/L (ref 22–32)
Calcium: 8.3 mg/dL — ABNORMAL LOW (ref 8.9–10.3)
Chloride: 90 mmol/L — ABNORMAL LOW (ref 98–111)
Creatinine, Ser: 1.64 mg/dL — ABNORMAL HIGH (ref 0.61–1.24)
GFR, Estimated: 49 mL/min — ABNORMAL LOW (ref >60)
Glucose, Bld: 373 mg/dL — ABNORMAL HIGH (ref 70–99)
Phosphorus: 2.9 mg/dL (ref 2.5–4.6)
Potassium: 5.2 mmol/L — ABNORMAL HIGH (ref 3.5–5.1)
Sodium: 123 mmol/L — ABNORMAL LOW (ref 135–145)

## 2021-10-28 MED ORDER — FUROSEMIDE 40 MG PO TABS
40.0000 mg | ORAL_TABLET | Freq: Two times a day (BID) | ORAL | Status: DC
  Administered 2021-10-28 – 2021-10-30 (×4): 40 mg via ORAL
  Filled 2021-10-28 (×4): qty 1

## 2021-10-28 MED ORDER — INSULIN ASPART 100 UNIT/ML IJ SOLN: 6.0000 [IU] | Freq: Four times a day (QID) | INTRAMUSCULAR | Status: DC

## 2021-10-28 MED ORDER — PREDNISONE 5 MG PO TABS
5.0000 mg | ORAL_TABLET | Freq: Every day | ORAL | Status: DC
  Administered 2021-10-29 – 2021-10-30 (×2): 5 mg via ORAL
  Filled 2021-10-28 (×2): qty 1

## 2021-10-28 MED ORDER — OXYCODONE HCL 5 MG PO TABS
15.0000 mg | ORAL_TABLET | ORAL | Status: DC
Start: 2021-10-28 — End: 2021-10-30
  Administered 2021-10-28 – 2021-10-30 (×12): 15 mg via ORAL
  Filled 2021-10-28 (×12): qty 3

## 2021-10-28 MED ORDER — AMLODIPINE BESYLATE 10 MG PO TABS
10.0000 mg | ORAL_TABLET | Freq: Every day | ORAL | Status: DC
  Administered 2021-10-29 – 2021-10-30 (×2): 10 mg via ORAL
  Filled 2021-10-28 (×2): qty 1

## 2021-10-28 MED ORDER — DOCUSATE SODIUM 100 MG PO CAPS
100.0000 mg | ORAL_CAPSULE | Freq: Two times a day (BID) | ORAL | Status: DC
  Administered 2021-10-28 – 2021-10-29 (×2): 100 mg via ORAL
  Filled 2021-10-28 (×2): qty 1

## 2021-10-28 MED ORDER — CARVEDILOL 25 MG PO TABS
25.0000 mg | ORAL_TABLET | Freq: Two times a day (BID) | ORAL | Status: DC
Start: 2021-10-28 — End: 2021-10-30
  Administered 2021-10-28 – 2021-10-29 (×2): 25 mg via ORAL
  Filled 2021-10-28 (×3): qty 1

## 2021-10-28 MED ORDER — INSULIN GLARGINE-YFGN 100 UNIT/ML ~~LOC~~ SOLN
22.0000 [IU] | Freq: Every day | SUBCUTANEOUS | Status: DC
  Administered 2021-10-28 – 2021-10-29 (×2): 22 [IU] via SUBCUTANEOUS
  Filled 2021-10-28 (×3): qty 0.22

## 2021-10-28 MED ORDER — SODIUM CHLORIDE 0.9 % IV BOLUS: 1000.0000 mL | Freq: Once | INTRAVENOUS | Status: DC

## 2021-10-28 MED ORDER — ATORVASTATIN CALCIUM 40 MG PO TABS
40.0000 mg | ORAL_TABLET | Freq: Every evening | ORAL | Status: DC
  Administered 2021-10-28 – 2021-10-29 (×2): 40 mg via ORAL
  Filled 2021-10-28 (×2): qty 1

## 2021-10-28 MED ORDER — CLONIDINE HCL 0.1 MG PO TABS
0.1000 mg | ORAL_TABLET | Freq: Two times a day (BID) | ORAL | Status: DC
Start: 2021-10-28 — End: 2021-10-30
  Administered 2021-10-28 – 2021-10-30 (×4): 0.1 mg via ORAL
  Filled 2021-10-28 (×4): qty 1

## 2021-10-28 MED ORDER — RAMIPRIL 5 MG PO CAPS
20.0000 mg | ORAL_CAPSULE | Freq: Every day | ORAL | Status: DC
  Administered 2021-10-29 – 2021-10-30 (×2): 20 mg via ORAL
  Filled 2021-10-28 (×2): qty 4

## 2021-10-28 MED ORDER — OXYCODONE HCL 5 MG PO TABS
15.0000 mg | ORAL_TABLET | Freq: Once | ORAL | Status: AC
  Administered 2021-10-28: 15 mg via ORAL
  Filled 2021-10-28: qty 3

## 2021-10-28 MED ORDER — INSULIN ASPART 100 UNIT/ML IJ SOLN
0.0000 [IU] | Freq: Four times a day (QID) | INTRAMUSCULAR | Status: DC
  Administered 2021-10-28 (×2): 9 [IU] via SUBCUTANEOUS
  Administered 2021-10-28: 5 [IU] via SUBCUTANEOUS
  Administered 2021-10-29: 2 [IU] via SUBCUTANEOUS
  Administered 2021-10-29: 5 [IU] via SUBCUTANEOUS
  Administered 2021-10-29 – 2021-10-30 (×2): 2 [IU] via SUBCUTANEOUS
  Administered 2021-10-30 (×2): 3 [IU] via SUBCUTANEOUS

## 2021-10-28 NOTE — Progress Notes (Signed)
Mobility Specialist Progress Note  ? ? 10/28/21 1245  ?Mobility  ?Activity Ambulated independently in hallway  ?Level of Assistance Standby assist, set-up cues, supervision of patient - no hands on  ?Assistive Device None  ?Distance Ambulated (ft) 500 ft  ?Activity Response Tolerated well  ?$Mobility charge 1 Mobility  ? ?Pt received sitting EOB and agreeable. No complaints. Returned to sitting EOB with call bell in reach.  ? ?Hildred Alamin ?Mobility Specialist  ?  ?

## 2021-10-28 NOTE — Progress Notes (Addendum)
? ?Subjective: ? ?No acute overnight events. ? ?Doing well this morning. Discussed ongoing struggles to maintain normoglycemia. States he knows how to manage his sugars well at home. Discussed that it will be different with addition of tube feeds. He understands. ? ?No other complaints or concerns at this time. ? ?Objective: ? ?Vital signs in last 24 hours: ?Vitals:  ? 10/27/21 0835 10/27/21 1648 10/27/21 2015 10/28/21 0756  ?BP: (!) 199/82 132/61 (!) 142/74 (!) 165/70  ?Pulse: 79 65 67 64  ?Resp: '18 20 20 18  '$ ?Temp: 98 ?F (36.7 ?C) 97.9 ?F (36.6 ?C) 98.2 ?F (36.8 ?C) 98 ?F (36.7 ?C)  ?TempSrc:   Oral   ?SpO2: 100% 97% 100% 99%  ?Weight:      ?Height:      ? ?Weight change:  ? ?Intake/Output Summary (Last 24 hours) at 10/28/2021 1308 ?Last data filed at 10/27/2021 1900 ?Gross per 24 hour  ?Intake 240 ml  ?Output --  ?Net 240 ml  ? ?General: middle aged male, sitting up in bed, NAD. ?Neck: unchanged b/l swelling of proximal neck. ?CV: normal rate and regular rhythm, systolic murmur present. ?Pulm: normal work of breathing on RA. ?Abdomen: soft, nondistended, nontender, G-tube in place with dressing c/d/I. 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 ?Skin: port-a-cath at R IJ with dressing c/d/I. ?Neuro: AAOx3 ? ? ?Assessment/Plan: ? ?Principal Problem: ?  Dehydration with hyponatremia ?Active Problems: ?  History of kidney transplant ?  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  Dysphagia ?  Malnutrition of moderate degree ?  Type 1 diabetes mellitus with hyperglycemia (HCC) ? ?Arthur Burke a 56 yo with a pmhx significant for ESRD s/p transplant (left kidney, 2005) 2/2 diabetic nephropathy, T1DM, HTN, left BKA presenting with dysphagia causing poor PO intake found to have an oropharyngeal tumor concerning for squamous cell carcinoma and was admitted for biopsy and management of hypovolemic hyponatremia and an AKI. ?  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 Airway remains patent, tolerating liquid diet well. MBS completed for baseline swallow assessment prior to radiation treatment, as below. Biopsies confirm squamous cell carcinoma. Per oncology, to receive outpatient PET scan once discharged.  ?- full thin liquid diet with continued bolus feeds per tube  ?- Appreciate ENT and Oncology recommendations ?- Outpatient follow-up with oncology as above ?  ?#Dysphagia ?#Nutrition via tube feeds ?#Moderate malnutrition ?Patient with G-tube in place, tolerating bolus tube feeds well. Out of window for refeeding syndrome. Tolerating PO intake as well. Will ask nutrition to counsel on tube feeds again prior to discharge. ?-appreciate dietician assistance ?-Osmolite 1.5 @ 30 ml/hr and advance 10 ml every 8 hours until goal rate of 60 ml/hr ?-Full liquid diet ?-TOC to assist with setting up tube feeds at home ?  ?#Hyponatremia; improving ?Patient with asymptomatic hyponatremia with Na of 117 on admission likely 2/2 hypovolemic hyponatremia in setting of poor PO intake. Slight drop in sodium today, corrected sodium of ~126. Euvolemic on exam. ?-will trial fluid restriction to 1.5L/day ?-repeat BMP tomorrow ?  ?#Pre-renal AKI, resolved ?#Hx of renal failure s/p transplant ?Patient with left kidney transplant in place since 2005, followed by Dr. Susy Frizzle at [facility] nephrology. Pre-renal AKI likely secondary to dehydration and reduced po intake over past two weeks. Patient now on all immunosuppressive home meds per tube with help from pharmacy for continued monitoring and dosing. Renal function remains stable and at baseline. ?- continue tacrolimus, mycophenolate,  prednisone ?- appreciate continued nephrology recs ?- BMP tomorrow AM ?- strict I/O ?- avoid nephrotoxic medications ?  ?#T1DM ?Will need outpatient endocrinology follow up. CBGs remained elevated to 300s overnight. We have been having a difficult time with maintaining  normoglycemia given labile CBGs. Will try to further optimize glycemic control. Patient states that he would have an easier time with managing at home as he knows how his sugars trend with the meals he consumes. ?-increase semglee to 22u qhs ?-increase novolog to 6u with bolus feeds (4x daily) ?-sensitive SSI with bolus feeds (4x daily) ?-Patient to be referred to Endocrinologist in [place], needs close follow up to optimize insulin regimen and work to obtain CGM/insulin pump  ?- SSI Q4 ?-CBG q4h ?  ?#HTN ?Continuing all home antihypertensives per tube. ?-continue home norvasc '10mg'$ , coreg '25mg'$  BID, ramipril '20mg'$ , and clonidine 0.'1mg'$  BID ?-IV hydralazine '5mg'$  q4h prn for SBP >180 ? ?#Chronic pain ?On home oxycodone 15 mg q4hrs per tube. Patient states pain currently at baseline and manageable with current regimen.   ?- oxycodone 15 mg q4hrs  ?- prn Dilaudid 0.5-'1mg'$  q3hrs for breakthrough pain  ? ?Leukocytosis; improving ?WBC decreased to 17.9 from 19.4 (3/17). Values continue to fluctuate in the setting of  immunosuppressive therapy, as above, versus reactive secondary to recent procedures. Patient is afebrile and with no active signs of infection at this time. However given immunosuppressive therapy, will CTM for underlying infection, continues to be unlikely at this moment. ?-cbc tomorrow AM ? ? ? LOS: 7 days  ? ?Virl Axe, MD ?10/28/2021, 1:08 PM  ?

## 2021-10-29 DIAGNOSIS — E440 Moderate protein-calorie malnutrition: Secondary | ICD-10-CM | POA: Diagnosis not present

## 2021-10-29 DIAGNOSIS — R1310 Dysphagia, unspecified: Secondary | ICD-10-CM | POA: Diagnosis not present

## 2021-10-29 DIAGNOSIS — E860 Dehydration: Secondary | ICD-10-CM | POA: Diagnosis not present

## 2021-10-29 DIAGNOSIS — C4442 Squamous cell carcinoma of skin of scalp and neck: Secondary | ICD-10-CM | POA: Diagnosis not present

## 2021-10-29 LAB — CBC WITH DIFFERENTIAL/PLATELET
Abs Immature Granulocytes: 0.05 10*3/uL (ref 0.00–0.07)
Basophils Absolute: 0 10*3/uL (ref 0.0–0.1)
Basophils Relative: 0 %
Eosinophils Absolute: 0.2 10*3/uL (ref 0.0–0.5)
Eosinophils Relative: 1 %
HCT: 25.1 % — ABNORMAL LOW (ref 39.0–52.0)
Hemoglobin: 8.3 g/dL — ABNORMAL LOW (ref 13.0–17.0)
Immature Granulocytes: 0 %
Lymphocytes Relative: 38 %
Lymphs Abs: 6.2 10*3/uL — ABNORMAL HIGH (ref 0.7–4.0)
MCH: 28.7 pg (ref 26.0–34.0)
MCHC: 33.1 g/dL (ref 30.0–36.0)
MCV: 86.9 fL (ref 80.0–100.0)
Monocytes Absolute: 1.6 10*3/uL — ABNORMAL HIGH (ref 0.1–1.0)
Monocytes Relative: 10 %
Neutro Abs: 8.4 10*3/uL — ABNORMAL HIGH (ref 1.7–7.7)
Neutrophils Relative %: 51 %
Platelets: 244 10*3/uL (ref 150–400)
RBC: 2.89 MIL/uL — ABNORMAL LOW (ref 4.22–5.81)
RDW: 14 % (ref 11.5–15.5)
WBC: 16.4 10*3/uL — ABNORMAL HIGH (ref 4.0–10.5)
nRBC: 0 % (ref 0.0–0.2)

## 2021-10-29 LAB — GLUCOSE, CAPILLARY
Glucose-Capillary: 227 mg/dL — ABNORMAL HIGH (ref 70–99)
Glucose-Capillary: 193 mg/dL — ABNORMAL HIGH (ref 70–99)
Glucose-Capillary: 248 mg/dL — ABNORMAL HIGH (ref 70–99)
Glucose-Capillary: 273 mg/dL — ABNORMAL HIGH (ref 70–99)

## 2021-10-29 LAB — BASIC METABOLIC PANEL
Anion gap: 5 (ref 5–15)
BUN: 61 mg/dL — ABNORMAL HIGH (ref 6–20)
CO2: 30 mmol/L (ref 22–32)
Calcium: 8.2 mg/dL — ABNORMAL LOW (ref 8.9–10.3)
Chloride: 87 mmol/L — ABNORMAL LOW (ref 98–111)
Creatinine, Ser: 1.46 mg/dL — ABNORMAL HIGH (ref 0.61–1.24)
GFR, Estimated: 57 mL/min — ABNORMAL LOW (ref >60)
Glucose, Bld: 210 mg/dL — ABNORMAL HIGH (ref 70–99)
Potassium: 5.1 mmol/L (ref 3.5–5.1)
Sodium: 122 mmol/L — ABNORMAL LOW (ref 135–145)

## 2021-10-29 LAB — TACROLIMUS LEVEL: Tacrolimus (FK506) - LabCorp: 2.8 ng/mL (ref 2.0–20.0)

## 2021-10-29 LAB — OSMOLALITY: Osmolality: 285 mOsm/kg (ref 275–295)

## 2021-10-29 MED ORDER — DOCUSATE SODIUM 50 MG/5ML PO LIQD
100.0000 mg | Freq: Two times a day (BID) | ORAL | Status: DC
  Administered 2021-10-29 – 2021-10-30 (×2): 100 mg
  Filled 2021-10-29 (×2): qty 10

## 2021-10-29 MED ORDER — PANTOPRAZOLE SODIUM 40 MG IV SOLR
40.0000 mg | Freq: Every day | INTRAVENOUS | Status: DC
  Administered 2021-10-29: 40 mg via INTRAVENOUS
  Filled 2021-10-29: qty 10

## 2021-10-29 MED ORDER — TACROLIMUS 1 MG/ML ORAL SUSPENSION
2.0000 mg | Freq: Every morning | ORAL | Status: DC
  Administered 2021-10-30: 2 mg
  Filled 2021-10-29 (×2): qty 2

## 2021-10-29 MED ORDER — TACROLIMUS 1 MG/ML ORAL SUSPENSION
1.0000 mg | Freq: Every day | ORAL | Status: DC
  Administered 2021-10-29: 1 mg
  Filled 2021-10-29 (×2): qty 1

## 2021-10-29 MED ORDER — INSULIN ASPART 100 UNIT/ML IJ SOLN
8.0000 [IU] | Freq: Four times a day (QID) | INTRAMUSCULAR | Status: DC
  Administered 2021-10-29: 8 [IU] via SUBCUTANEOUS

## 2021-10-29 MED ORDER — INSULIN ASPART 100 UNIT/ML IJ SOLN
6.0000 [IU] | Freq: Four times a day (QID) | INTRAMUSCULAR | Status: DC
  Administered 2021-10-29 – 2021-10-30 (×6): 6 [IU] via SUBCUTANEOUS

## 2021-10-29 NOTE — Progress Notes (Signed)
Brief nutrition note ? ?Received consult regarding home TF education. Pt has already received education on how to administer home TF x2. RD will provide specific nutrients provided by each bolus for pt and will ensure all relevant material is still attached to chart and discharge instructions.  ? ? ?Arthur Burke., MS, RD, LDN (she/her/hers) ?RD pager number and weekend/on-call pager number located in [place]. ? ?

## 2021-10-29 NOTE — Progress Notes (Addendum)
? ?Subjective: ? ?Discussed high sugars overnight and this morning. He knows that we are still working to achieve better control and he is amenable to staying another day. ? ?Today with some questions about tube feed components and wondering if carb amount is too low. States he feels well and would have an easier time controlling sugars at home. He is having normal bowel movement and feels that tube site is healing well. ? ?Objective: ? ?Vital signs in last 24 hours: ?Vitals:  ? 10/28/21 0756 10/28/21 1557 10/28/21 2100 10/29/21 0802  ?BP: (!) 165/70 126/69 (!) 167/51 (!) 151/70  ?Pulse: 64 (!) 58 61 63  ?Resp: '18 16 16 18  '$ ?Temp: 98 ?F (36.7 ?C) 98 ?F (36.7 ?C) 97.7 ?F (36.5 ?C) 98 ?F (36.7 ?C)  ?TempSrc:  Oral Oral   ?SpO2: 99% 99% 95% 100%  ?Weight:      ?Height:      ? ?Weight change:  ?No intake or output data in the 24 hours ending 10/29/21 0942 ? ?Constitutional: Middle aged man sitting comfortably in bed, alert and conversant, in NAD ?HEENT: atraumatic, normocephalic ?Pulm: CTAB, no increased work of breathing on room air ?Cardio: normal rate and rhythm, Systolic murmur heard over P/M/T regions ?Abdomen: soft, nontender, nondistended ?Extremities: Left BKA, +1 pitting edema to right leg below mid shin ?Skin: warm and dry, port-a-cath at right IJ, G tube with overlying dressing ?Neuro: Alert and oriented x3 ?Psych: normal affect ? ?Assessment/Plan: ? ?Principal Problem: ?  Dehydration with hyponatremia ?Active Problems: ?  History of kidney transplant ?  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  Dysphagia ?  Malnutrition of moderate degree ?  Type 1 diabetes mellitus with hyperglycemia (HCC) ? ?Mr. Arthur Burke a 56 yo with a pmhx significant for ESRD s/p transplant (left kidney, 2005) 2/2 diabetic nephropathy, T1DM, HTN, left BKA presenting with dysphagia causing poor PO intake found to have an oropharyngeal tumor concerning for squamous cell carcinoma and was admitted for biopsy and management of hypovolemic hyponatremia and  an AKI. ?  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 Airway remains patent, tolerating liquid diet well. MBS completed for baseline swallow assessment prior to radiation treatment, as below. Biopsies confirm squamous cell carcinoma. Per oncology, to receive outpatient PET scan once discharged.  ?- full thin liquid diet with continued bolus feeds per tube  ?- Appreciate ENT and Oncology recommendations ?- Outpatient follow-up with oncology as above ?  ?#Dysphagia ?#Nutrition via tube feeds ?#Moderate malnutrition ?Patient with G-tube in place, tolerating bolus tube feeds well. Out of window for refeeding syndrome. Tolerating PO intake as well. Patient today with some questions about tube feed components, will ask nutrition to counsel on nutrition components of tube feeds prior to discharge. ?-appreciate dietician assistance ?-Osmolite 1.5 @ 30 ml/hr and advance 10 ml every 8 hours until goal rate of 60 ml/hr ?-Full liquid diet ?-TOC to assist with setting up tube feeds at home ?  ?#Hyponatremia; worsening ?Patient with asymptomatic hyponatremia with Na of 117 on admission likely 2/2 hypovolemic hyponatremia in setting of poor PO intake. Sodium had improved for several days but now continues to trend downwards, today with corrected sodium of ~124. Patient with +1 pitting edema to right leg below mid shin on exam today. Yesterday euvolemic and trialed with fluid restriction to 1.5L/day, strict I/o were not recorded. Unsure if tube feed contents are contributing to worsening hyponatremia. Will speak to dietician today  about feeds. Will obtain serum osmolality, urine Na, and urine creatinine today. ?-f/u serum osmolality, urine Na and creatinine ?-repeat BMP tomorrow ?  ?#Pre-renal AKI, resolved ?#Hx of renal failure s/p transplant ?Patient with left kidney transplant in place since 2005, followed by Dr.  Susy Frizzle at [facility] nephrology. Pre-renal AKI likely secondary to dehydration and reduced po intake over past two weeks. Patient now on all immunosuppressive home meds per tube with help from pharmacy for continued monitoring and dosing. Renal function remains stable and at baseline. ?- continue tacrolimus, mycophenolate, prednisone ?- appreciate continued nephrology recs ?- BMP tomorrow AM ?- strict I/O ?- avoid nephrotoxic medications ?  ?#T1DM ?Will need outpatient endocrinology follow up. CBG elevated last night to 411 and this AM to 227. We continue to have a difficult time maintaining patient within normal glycemic range. Today, noted per chart review, patient did not receive Novolog tube feed coverage on 10/28/21 (no explanation of why). Diabetes coordinator spoke to nursing staff to ensure administration of SA with bolus feeds. Will continue Semglee 22u qhs and Novolog 6u with bolus feeds today. Will continue to monitor CBGs with changes and further optimize glycemic control as needed.  ?-Continue semglee to 22u qhs, novolog to 6u with bolus feeds (4x daily) ?-sensitive SSI with bolus feeds (4x daily) ?-Patient to be referred to Endocrinologist in [place], needs close follow up to optimize insulin regimen and work to obtain CGM/insulin pump  ?- SSI Q4 ?-CBG q4h ?  ?#HTN ?Continuing all home antihypertensives ?-continue home norvasc '10mg'$ , coreg '25mg'$  BID, ramipril '20mg'$ , and clonidine 0.'1mg'$  BID ?-IV hydralazine '5mg'$  q4h prn for SBP >180 ?  ?#Chronic pain ?On home oxycodone 15 mg q4hrs. Patient states pain currently at baseline and manageable with current regimen.   ?- oxycodone 15 mg q4hrs  ?- prn Dilaudid 0.5-'1mg'$  q3hrs for breakthrough pain  ?  ?Leukocytosis; improving ?WBC decreased to 16.4 from 17.9 (3/19). Values continue to fluctuate in the setting of  immunosuppressive therapy, as above, versus reactive secondary to recent procedures. Patient is afebrile and with no active signs of infection at this time. However  given immunosuppressive therapy, will CTM for underlying infection, continues to be unlikely at this moment. ?-cbc tomorrow AM ? ? LOS: 8 days  ? ?Rodriguez-Teodoro, Quintella Reichert, Medical Student ?10/29/2021, 9:42 AM  ? ?Attestation for Student Documentation: ? ?I personally was present and performed or re-performed the history, physical exam and medical decision-making activities of this service and have verified that the service and findings are accurately documented in the student?s note. ? ?Scarlett Presto, MD ?10/29/2021, 1:07 PM ? ?

## 2021-10-29 NOTE — Progress Notes (Signed)
Mobility Specialist: Progress Note ? ? 10/29/21 1655  ?Mobility  ?Activity  ?(WC mobility)  ?Level of Assistance Independent  ?Assistive Device Wheelchair  ?Distance Ambulated (ft) 700 ft  ?Activity Response Tolerated well  ?$Mobility charge 1 Mobility  ? ?Received pt in wheelchair having no complaints and agreeable to mobility. Asymptomatic throughout session. Pt remains in wheelchair to continue mobility independently in the hallway. All needs met.  ? ?Arthur Burke ?Mobility Specialist ?Mobility Specialist [place]: [phone] ?Mobility Specialist [place]: [phone] ? ?

## 2021-10-29 NOTE — Progress Notes (Signed)
Inpatient Diabetes Program Recommendations ? ?AACE/ADA: New Consensus Statement on Inpatient Glycemic Control  ? ?Target Ranges:  Prepandial:   less than 140 mg/dL ?     Peak postprandial:   less than 180 mg/dL (1-2 hours) ?     Critically ill patients:  140 - 180 mg/dL  ? ? Latest Reference Range & Units 10/29/21 04:26  ?Glucose-Capillary 70 - 99 mg/dL 227 (H)  ? ? Latest Reference Range & Units 10/28/21 07:41 10/28/21 11:45 10/28/21 15:32 10/28/21 20:10  ?Glucose-Capillary 70 - 99 mg/dL 233 (H) 370 (H) 264 (H) 411 (H)  ? ?Review of Glycemic Control ? ?Current orders for Inpatient glycemic control: Semglee 22 units QHS, Novolog 0-9 units QID, Novolog 8 units QID for TF cov; Prednisone 5 mg QAM, Osmolite 360 ml QID ? ?Inpatient Diabetes Program Recommendations:   ? ?Insulin: In reviewing chart, noted Novolog tube feeding coverage was not given at all on 10/28/21 (charted as not given each time; no explanation of why). NURSING: Please administer Novolog tube feeding coverage as ordered if patient is receiving bolus tube feeding as ordered. ? ?Thanks, ?Barnie Alderman, RN, MSN, CDE ?Diabetes Coordinator ?Inpatient Diabetes Program ?[phone] (Team Pager from 8am to 5pm) ? ? ? ?

## 2021-10-29 NOTE — Progress Notes (Signed)
Speech Language Pathology Treatment: Dysphagia  ?Patient Details ?Name: Arthur Burke ?MRN: [id number] ?DOB: 10-08-1965 ?Today's Date: 10/29/2021 ?Time: 1140-1150 ?SLP Time Calculation (min) (ACUTE ONLY): 10 min ? ?Assessment / Plan / Recommendation ?Clinical Impression ? F/u with Arthur Burke after Friday's MBS. We reviewed results and spent time talking about f/u OP SLP for dysphagia intervention.  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 We discussed the value of exercises that are simple yet effective in staving off some of these negative effects and the importance of doing them daily and long-term.  Arthur Burke verbalized understanding. He should be linked up with OP SLP through Arthur Burke and the ENT navigator. ? ?No further SLP f/u is needed while admitted.   ?Thank you for consult. ? ?  ?HPI HPI: Arthur Burke is an 56 y.o. male has had a lump in his neck for about a year at least.   He is lost about 14 pounds over the past couple of months due to poor appetite, He reported on admit he was having significant trouble swallowing. ENT laryngoscopy on 3/12 found  In the oropharynx there is a large exophytic mass arising from the lateral aspect.  The airway is not obstructed.  The larynx appears clear.  Is very difficult to visualize the cords due to AP collapse of the supraglottic larynx with inspiration he is able to widely open the glottis and there is no obstruction.  The tumor does seem to extend down to the vallecula. ENT recommended tx via chemo/radiation but not surgery. Airway was stable at that time. Underwent biopsy and PEG on 3/13. ?  ?   ?SLP Plan ? All goals met;Discharge SLP treatment due to (comment) ? ?  ?  ?Recommendations for follow up therapy are one component of a multi-disciplinary discharge planning process, led by the attending physician.  Recommendations may be updated based on patient status,  additional functional criteria and insurance authorization. ?  ? ?Recommendations  ?Diet recommendations: Regular;Thin liquid ?Liquids provided via: Cup;Straw  ?   ?    ?   ? ? ? ? Oral Care Recommendations: Oral care BID ?Follow Up Recommendations: Outpatient SLP ?SLP Visit Diagnosis: Dysphagia, oropharyngeal phase (R13.12) ?Plan: All goals met;Discharge SLP treatment due to (comment) ? ? ? ? ?  ?  ? ?Arthur Burke L. Arthur Busbee, MA CCC/SLP ?Acute Rehabilitation Services ?Office number [phone] ?Pager [phone] ? ?Arthur Burke ? ?10/29/2021, 11:51 AM ?

## 2021-10-29 NOTE — Plan of Care (Signed)

## 2021-10-29 NOTE — Discharge Instructions (Addendum)
Each bolus of Osmolite 1.5 that has been recommended to you by your dietitian provides 532 calories, 22 grams protein, 17 grams fat, 72 grams carbohydrate ?(This is the nutrition information for the 1.5 cartons/339m bolus regimen you've been prescribed) ?Additional information can be found at: https://www.[employer].com/our-products/osmolite-1_5-cal ? ? ?BLane Hacker? ?[facility]. We took a biopsy of this mass that did show it was cancerous. You will follow with the cancer doctors as an outpatient to receive treatment. We also placed a tube through your stomach to help you eat and drink enough food since you have difficulty swallowing.   ? ?Continue taking your home medications with the following changes ? ?You should now take 22 units of long acting and 6 units of short acting with meals ?Stop taking meloxicam ?Continue taking your tacrolimus except you will now take 1 mg in the morning under your tongue, then take 0.5 mg under your tongue at night. Make sure you wait 10 minutes between capsules. ?For the medications you can't swallow crush them and take them per your tube.  ? ? ?You should seek further medical care if you have trouble breathing, dizziness, confusion, or severe uncontrolled pain. ? ?We recommend that you see your primary care doctor in about a week to make sure that you continue to improve. We are so glad that you are feeling better. ? ? ? ?Sincerely, ?AScarlett Presto MD ? ? ?

## 2021-10-30 ENCOUNTER — Other Ambulatory Visit (HOSPITAL_COMMUNITY): Payer: Self-pay

## 2021-10-30 DIAGNOSIS — E860 Dehydration: Secondary | ICD-10-CM | POA: Diagnosis not present

## 2021-10-30 DIAGNOSIS — E440 Moderate protein-calorie malnutrition: Secondary | ICD-10-CM | POA: Diagnosis not present

## 2021-10-30 DIAGNOSIS — R1310 Dysphagia, unspecified: Secondary | ICD-10-CM | POA: Diagnosis not present

## 2021-10-30 DIAGNOSIS — C4442 Squamous cell carcinoma of skin of scalp and neck: Secondary | ICD-10-CM | POA: Diagnosis not present

## 2021-10-30 LAB — GLUCOSE, CAPILLARY
Glucose-Capillary: 259 mg/dL — ABNORMAL HIGH (ref 70–99)
Glucose-Capillary: 193 mg/dL — ABNORMAL HIGH (ref 70–99)
Glucose-Capillary: 202 mg/dL — ABNORMAL HIGH (ref 70–99)
Glucose-Capillary: 217 mg/dL — ABNORMAL HIGH (ref 70–99)

## 2021-10-30 LAB — BASIC METABOLIC PANEL
Anion gap: 7 (ref 5–15)
BUN: 63 mg/dL — ABNORMAL HIGH (ref 6–20)
CO2: 27 mmol/L (ref 22–32)
Calcium: 8.4 mg/dL — ABNORMAL LOW (ref 8.9–10.3)
Chloride: 88 mmol/L — ABNORMAL LOW (ref 98–111)
Creatinine, Ser: 1.59 mg/dL — ABNORMAL HIGH (ref 0.61–1.24)
GFR, Estimated: 51 mL/min — ABNORMAL LOW (ref >60)
Glucose, Bld: 253 mg/dL — ABNORMAL HIGH (ref 70–99)
Potassium: 5.1 mmol/L (ref 3.5–5.1)
Sodium: 122 mmol/L — ABNORMAL LOW (ref 135–145)

## 2021-10-30 LAB — CREATININE, URINE, RANDOM: Creatinine, Urine: 37.98 mg/dL

## 2021-10-30 LAB — SODIUM, URINE, RANDOM: Sodium, Ur: 22 mmol/L

## 2021-10-30 MED ORDER — TACROLIMUS 1 MG/ML ORAL SUSPENSION
1.0000 mg | Freq: Every day | ORAL | 0 refills | Status: AC
  Filled 2021-10-30: qty 1, 1d supply, fill #0

## 2021-10-30 MED ORDER — MYCOPHENOLATE 200 MG/ML ORAL SUSPENSION
500.0000 mg | Freq: Two times a day (BID) | ORAL | 0 refills | Status: AC
  Filled 2021-10-30: qty 600, 30d supply, fill #0

## 2021-10-30 MED ORDER — RAMIPRIL 10 MG PO CAPS: 20.0000 mg | ORAL_CAPSULE | Freq: Every day | ORAL | 0 refills | Status: DC

## 2021-10-30 MED ORDER — VITRUM SENIOR PO TABS
1.0000 | ORAL_TABLET | Freq: Every day | ORAL | 0 refills | Status: DC
  Filled 2021-10-30: qty 30, 30d supply, fill #0

## 2021-10-30 MED ORDER — CARVEDILOL 25 MG PO TABS: 25.0000 mg | ORAL_TABLET | Freq: Two times a day (BID) | ORAL | 0 refills | Status: DC

## 2021-10-30 MED ORDER — OXYCODONE HCL 15 MG PO TABS: 15.0000 mg | ORAL_TABLET | ORAL | 0 refills | Status: DC

## 2021-10-30 MED ORDER — OXYCODONE HCL 15 MG PO TABS
15.0000 mg | ORAL_TABLET | ORAL | 0 refills | Status: DC
  Filled 2021-10-30: qty 30, 5d supply, fill #0

## 2021-10-30 MED ORDER — AMLODIPINE BESYLATE 10 MG PO TABS: 10.0000 mg | ORAL_TABLET | Freq: Every day | ORAL | 0 refills | Status: DC

## 2021-10-30 MED ORDER — VITAMIN B-12 500 MCG PO TABS: 500.0000 ug | ORAL_TABLET | Freq: Every day | ORAL | 0 refills | Status: DC

## 2021-10-30 MED ORDER — VITRUM SENIOR PO TABS: 1.0000 | ORAL_TABLET | Freq: Every day | ORAL | 0 refills | Status: DC

## 2021-10-30 MED ORDER — CLONIDINE HCL 0.1 MG PO TABS: 0.1000 mg | ORAL_TABLET | Freq: Two times a day (BID) | ORAL | 11 refills | Status: DC

## 2021-10-30 MED ORDER — ATORVASTATIN CALCIUM 40 MG PO TABS
40.0000 mg | ORAL_TABLET | Freq: Every evening | ORAL | 0 refills | Status: DC
Start: 2021-10-30 — End: 2021-10-30

## 2021-10-30 MED ORDER — CALCIUM 600-200 MG-UNIT PO TABS
1.0000 | ORAL_TABLET | Freq: Every day | ORAL | 0 refills | Status: DC
Start: 2021-10-30 — End: 2021-10-30

## 2021-10-30 MED ORDER — INSULIN GLARGINE 100 UNITS/ML SOLOSTAR PEN: 22.0000 [IU] | PEN_INJECTOR | Freq: Every day | SUBCUTANEOUS | 11 refills | Status: DC

## 2021-10-30 MED ORDER — OSMOLITE 1.5 CAL PO LIQD: 360.0000 mL | Freq: Four times a day (QID) | ORAL | 0 refills | Status: AC

## 2021-10-30 MED ORDER — TACROLIMUS 0.5 MG PO CAPS: 1.0000 mg | ORAL_CAPSULE | Freq: Every morning | ORAL | 0 refills | Status: AC

## 2021-10-30 MED ORDER — ASCORBIC ACID 500 MG PO TABS
500.0000 mg | ORAL_TABLET | Freq: Every day | ORAL | 0 refills | Status: DC
  Filled 2021-10-30: qty 30, 30d supply, fill #0

## 2021-10-30 MED ORDER — TACROLIMUS 1 MG/ML ORAL SUSPENSION
2.0000 mg | Freq: Every morning | ORAL | 0 refills | Status: DC
  Filled 2021-10-30: qty 60, 30d supply, fill #0

## 2021-10-30 MED ORDER — PREDNISONE 5 MG PO TABS
5.0000 mg | ORAL_TABLET | Freq: Every day | ORAL | 0 refills | Status: DC
  Filled 2021-10-30: qty 30, 30d supply, fill #0

## 2021-10-30 MED ORDER — CARVEDILOL 25 MG PO TABS
25.0000 mg | ORAL_TABLET | Freq: Two times a day (BID) | ORAL | 0 refills | Status: DC
Start: 2021-10-30 — End: 2021-10-30

## 2021-10-30 MED ORDER — POLYETHYLENE GLYCOL 3350 17 G PO PACK: 17.0000 g | PACK | Freq: Every day | ORAL | 0 refills | Status: DC | PRN

## 2021-10-30 MED ORDER — MYCOPHENOLATE 200 MG/ML ORAL SUSPENSION
500.0000 mg | Freq: Two times a day (BID) | ORAL | 0 refills | Status: DC
  Filled 2021-10-30: qty 600, 30d supply, fill #0

## 2021-10-30 MED ORDER — CALCIUM 600-200 MG-UNIT PO TABS: 1.0000 | ORAL_TABLET | Freq: Every day | ORAL | 0 refills | Status: DC

## 2021-10-30 MED ORDER — OMEPRAZOLE 40 MG PO CPDR: 40.0000 mg | DELAYED_RELEASE_CAPSULE | Freq: Every day | ORAL | 0 refills | Status: DC

## 2021-10-30 MED ORDER — MYCOPHENOLATE 200 MG/ML ORAL SUSPENSION: 500.0000 mg | Freq: Two times a day (BID) | ORAL | 0 refills | Status: DC

## 2021-10-30 MED ORDER — HEPARIN SOD (PORK) LOCK FLUSH 100 UNIT/ML IV SOLN
500.0000 [IU] | INTRAVENOUS | Status: AC | PRN
  Administered 2021-10-30: 500 [IU]
  Filled 2021-10-30: qty 5

## 2021-10-30 MED ORDER — TACROLIMUS 0.5 MG PO CAPS
1.0000 mg | ORAL_CAPSULE | Freq: Every morning | ORAL | 0 refills | Status: DC
  Filled 2021-10-30: qty 60, 30d supply, fill #0

## 2021-10-30 MED ORDER — TACROLIMUS 1 MG/ML ORAL SUSPENSION
1.0000 mg | Freq: Every day | ORAL | 0 refills | Status: DC
  Filled 2021-10-30: qty 30, 30d supply, fill #0

## 2021-10-30 MED ORDER — HUMALOG KWIKPEN 100 UNIT/ML ~~LOC~~ SOPN
6.0000 [IU] | PEN_INJECTOR | Freq: Three times a day (TID) | SUBCUTANEOUS | 11 refills | Status: DC
  Filled 2021-10-30: qty 15, 84d supply, fill #0

## 2021-10-30 MED ORDER — FUROSEMIDE 40 MG PO TABS: 40.0000 mg | ORAL_TABLET | Freq: Two times a day (BID) | ORAL | 0 refills | Status: DC

## 2021-10-30 MED ORDER — HUMALOG KWIKPEN 100 UNIT/ML ~~LOC~~ SOPN: 6.0000 [IU] | PEN_INJECTOR | Freq: Three times a day (TID) | SUBCUTANEOUS | 11 refills | Status: DC

## 2021-10-30 MED ORDER — FUROSEMIDE 40 MG PO TABS
40.0000 mg | ORAL_TABLET | Freq: Two times a day (BID) | ORAL | 0 refills | Status: DC
  Filled 2021-10-30: qty 60, 30d supply, fill #0

## 2021-10-30 MED ORDER — TACROLIMUS 0.5 MG PO CAPS: 0.5000 mg | ORAL_CAPSULE | Freq: Every evening | ORAL | 0 refills | Status: AC

## 2021-10-30 MED ORDER — OMEPRAZOLE 40 MG PO CPDR
40.0000 mg | DELAYED_RELEASE_CAPSULE | Freq: Every day | ORAL | 0 refills | Status: DC
  Filled 2021-10-30: qty 30, 30d supply, fill #0

## 2021-10-30 MED ORDER — ATORVASTATIN CALCIUM 40 MG PO TABS: 40.0000 mg | ORAL_TABLET | Freq: Every evening | ORAL | 0 refills | Status: DC

## 2021-10-30 MED ORDER — OSMOLITE 1.5 CAL PO LIQD
360.0000 mL | Freq: Four times a day (QID) | ORAL | 0 refills | Status: DC
  Filled 2021-10-30: qty 43200, 30d supply, fill #0

## 2021-10-30 MED ORDER — TACROLIMUS 0.5 MG PO CAPS
0.5000 mg | ORAL_CAPSULE | Freq: Every evening | ORAL | 0 refills | Status: DC
  Filled 2021-10-30: qty 30, 30d supply, fill #0

## 2021-10-30 MED ORDER — ASCORBIC ACID 500 MG PO TABS: 500.0000 mg | ORAL_TABLET | Freq: Every day | ORAL | 0 refills | Status: AC

## 2021-10-30 MED ORDER — PREDNISONE 5 MG PO TABS: 5.0000 mg | ORAL_TABLET | Freq: Every day | ORAL | 0 refills | Status: AC

## 2021-10-30 NOTE — Progress Notes (Signed)
Pt refused SSI d/t receiving 6 units of novolog and his long acting insulin at the same time. He was concerned the SSI in addition to other 2 doses of insulin would be too much for him and make his blood sugar too low. RN explained SSI to pt and he acknowledged understanding but still declined the SSI at this time.  ?

## 2021-10-30 NOTE — Progress Notes (Signed)
Mobility Specialist: Progress Note ? ? 10/30/21 1653  ?Mobility  ?Activity Ambulated independently in hallway  ?Level of Assistance Independent  ?Assistive Device None  ?Distance Ambulated (ft) 1100 ft  ?Activity Response Tolerated well  ?$Mobility charge 1 Mobility  ? ?Received pt in bed having no complaints and agreeable to mobility. Asymptomatic throughout ambulation, returned back to bed w/ call bell in reach and all needs met. ? ?Arthur Burke Arthur Burke ?Mobility Specialist ?Mobility Specialist [place]: [phone] ?Mobility Specialist [place]: [phone] ? ?

## 2021-10-30 NOTE — Discharge Summary (Addendum)
? ?Name: Arthur Burke ?MRN: [id number] ?DOB: 28-Sep-1965 56 y.o. ?PCP: Patient, No Pcp Per (Inactive) ? ?Date of Admission: 10/21/2021  3:47 AM ?Date of Discharge:   10/30/21 ?Attending Physician: Lottie Mussel, MD ? ?Discharge Diagnosis: ?1. 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 Dysphagia ?3. Nutrition via tube feeds ?4. Moderate malnutrition ?5. Hyponatremia ?6. Pre-renal AKI, resolved ?7. Hx of renal failure s/p transplant ?8. T1DM ?9. HTN ?10. Chronic pain ?11. Leukocytosis ? ?Discharge Medications: ?Allergies as of 10/30/2021   ? ?   Reactions  ? Zolpidem Other (See Comments)  ? Other Reaction: CNS Disorder  ? ?  ? ?  ?Medication List  ?  ? ?STOP taking these medications   ? ?insulin aspart 100 UNIT/ML FlexPen ?Commonly known as: NOVOLOG ?  ?meloxicam 15 MG tablet ?Commonly known as: MOBIC ?  ?methocarbamol 500 MG tablet ?Commonly known as: ROBAXIN ?  ?mycophenolate 500 MG tablet ?Commonly known as: CELLCEPT ?  ?omeprazole 20 MG capsule ?Commonly known as: PRILOSEC ?  ?ramelteon 8 MG tablet ?Commonly known as: ROZEREM ?  ?sulfamethoxazole-trimethoprim 800-160 MG tablet ?Commonly known as: BACTRIM DS ?  ?tacrolimus 1 MG capsule ?Commonly known as: PROGRAF ?Replaced by: tacrolimus 1 mg/mL Susp ?  ? ?  ? ?TAKE these medications   ? ?amLODipine 10 MG tablet ?Commonly known as: NORVASC ?Place 1 tablet (10 mg total) into feeding tube daily. ?What changed: how to take this ?  ?ascorbic acid 500 MG tablet ?Commonly known as: VITAMIN C ?Place 1 tablet (500 mg total) into feeding tube daily. ?What changed: how to take this ?  ?atorvastatin 40 MG tablet ?Commonly known as: LIPITOR ?Place 1 tablet (40 mg total) into feeding tube every evening. ?What changed: how to take this ?  ?Calcium 600-200 MG-UNIT tablet ?Place 1 tablet into feeding tube daily. ?What changed: how to take this ?  ?carvedilol 25 MG tablet ?Commonly known as: COREG ?Place 1 tablet (25 mg total) into feeding tube 2 (two)  times daily. ?What changed: how to take this ?  ?cloNIDine 0.1 MG tablet ?Commonly known as: CATAPRES ?Place 1 tablet (0.1 mg total) into feeding tube 2 (two) times daily. ?What changed: how to take this ?  ?feeding supplement (OSMOLITE 1.5 CAL) Liqd ?Place 360 mLs into feeding tube 4 (four) times daily. ?  ?furosemide 40 MG tablet ?Commonly known as: LASIX ?Place 1 tablet (40 mg total) into feeding tube 2 (two) times daily. ?What changed: how to take this ?  ?HumaLOG KwikPen 100 UNIT/ML KwikPen ?Generic drug: insulin lispro ?Inject 6 Units into the skin 3 (three) times daily. ?What changed: how much to take ?  ?insulin glargine 100 unit/mL Sopn ?Commonly known as: LANTUS ?Inject 22 Units into the skin at bedtime. ?What changed: how much to take ?  ?mycophenolate 50 mg/mL Susp oral suspension ?Commonly known as: CELLCEPT ?Place 10 mLs (500 mg total) into feeding tube 2 (two) times daily. ?  ?oxyCODONE 15 MG immediate release tablet ?Commonly known as: ROXICODONE ?Place 1 tablet (15 mg total) into feeding tube every 4 (four) hours. ?What changed: how to take this ?  ?pantoprazole sodium 40 mg ?Commonly known as: PROTONIX ?Place 40 mg into feeding tube daily. ?  ?polyethylene glycol 17 g packet ?Commonly known as: MIRALAX / GLYCOLAX ?Place 17 g into feeding tube daily as needed for mild constipation. ?What changed: how to take this ?  ?predniSONE 5 MG tablet ?Commonly known as: DELTASONE ?Place 1 tablet (5  mg total) into feeding tube daily. ?What changed: how to take this ?  ?ramipril 10 MG capsule ?Commonly known as: ALTACE ?Place 2 capsules (20 mg total) into feeding tube daily. ?What changed: how to take this ?  ?tacrolimus 1 mg/mL Susp ?Commonly known as: PROGRAF ?Place 1 mL (1 mg total) into feeding tube at bedtime. ?Replaces: tacrolimus 1 MG capsule ?  ?tacrolimus 1 mg/mL Susp ?Commonly known as: PROGRAF ?Place 2 mLs (2 mg total) into feeding tube every morning. ?Start taking on: October 31, 2021 ?  ?vitamin B-12  500 MCG tablet ?Commonly known as: CYANOCOBALAMIN ?Place 1 tablet (500 mcg total) into feeding tube daily. ?What changed: how to take this ?  ?Vitrum Senior Tabs ?Place 1 tablet into feeding tube daily. ?What changed: how to take this ?  ? ?  ? ?  ?  ? ? ?  ?Durable Medical Equipment  ?(From admission, onward)  ?  ? ? ?  ? ?  Start     Ordered  ? 10/26/21 1433  For home use only DME Tube feeding  Once       ?Comments: tube feeding via PEG ?? Osmolite 1.5 360 mL (1.5 cartons) four times daily (9AM, 1PM, 5PM, 9PM), tube feeding regimen provides 2133 kcal, 89 grams of protein and 1083 free water  ??  ? 10/26/21 1434  ? ?  ?  ? ?  ? ? ?Disposition and follow-up:   ?Arthur Burke was discharged from [facility] in Stable condition.  At the hospital follow up visit please address: ? ?1. 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 Make sure he has been able to take all of his medications per tube ? ?2. Nutrition via tube feeds, Moderate malnutrition; T1DM- patient getting nutrition via G tube. Make sure he has needed supplies and has been ok with controlling his sugars. May benefit from CGM/pump. Refer to endocrinology. ? ?3. Hyponatremia- recheck BMP ? ?4. Tacrolimus sublingual dosing- recheck tacro level Friday morning as well as again Tuesday morning as sublingual dosing can affect the absorption. ? ?5. AKI- recheck BMP ? ?6.  Labs / imaging needed at time of follow-up: bmp, tacrolimus level ? ?7.  Pending labs/ test needing follow-up: none ? ?Follow-up Appointments: ? Follow-up Information   ? ? [place]. Schedule an appointment as soon as possible for a visit.   ?Why: They should call you to schedule an appointment. If you don't hear from them in two days please call their number at [phone] ?Contact information: ?[address] ?[place] ?[phone] ? ?  ?   ? ? [place] Follow up.   ?Why: home health RW, SW services will be provided by [facility], start of care with 48 hours post discharge. Questions please call [phone] ?Contact information: ?Questions please call [phone] ? ?  ?  ? ? [facility] Follow up.   ?Why: [facility] will provide you with your  nutrition/ tube feeding ?Contact information: ?[address] ?[place] [address] ?[phone] ? ? ?  ?  ? ?  ?  ? ?  ? ? ?Hospital Course by problem list: ?1. 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 He got imaging done which  was concerning for malignancy. 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 ENT was consulted as well as oncology who determined that mass was too large for surgery. He will get radiation/chemo as an outpatient. A port was placed by IR for which he can get chemo/blood draws. A G tube was also placed so that he can get adequate nutrition via tube feeds. He is able to swallow liquids and some of his pills as well by time of discharge. He has not had any airway compromise and was able to breath on room air. He has outpatient follow up scheduled for oncology. ? ?2. Hyponatremia ?Patient was initially hyponatremic on admission to about 117 in the setting of poor PO intake due to above. He was slowly corrected back to a mild-moderate hyponatremia. He was stabilized at about 124. Suspect this is in part due to inability to take in normal PO intake. Recommended patient drink thinks containing electrolytes and have this rechecked in clinic. Urine electrolytes were within normal limits. Stable upon time of DC ? ?3. Pre-renal AKI, resolved; Hx of renal failure s/p transplant ?Patient was hypovolemic on admission due to  above. He was fluid resuscitated and then had a G tube placed so he had adequate oral intake. His creatinine improved to what was better than his presumed prior baseline at about 1.40-1.6. His immunosuppres

## 2021-10-30 NOTE — TOC Transition Note (Addendum)
Transition of Care (TOC) - CM/SW Discharge Note ? ? ?Patient Details  ?Name: Arthur Burke ?MRN: [id number] ?Date of Birth: 03-18-1966 ? ?Transition of [facility]) CM/SW Contact:  ?Sharin Mons, RN ?Phone Number: ?10/30/2021, 12:05 PM ? ? ?Clinical Narrative:    ?Patient will DC to: Home ?Anticipated DC date: 10/30/2021 ?Family notified: yes, brother ?Transport by: car ? ?    Dehydration with hyponatremia, squamous cell carcinoma of the neck ? ?Per MD patient ready for DC today. RN, patient, patient's family, [facility] and [facility]  notified of DC. IM practice to follow pt once d/c, noted on AVS.  ?Osmolite /TF  @ bedside and to go home with pt ,2 boxes ( > than 3 day supply). ? ?Pt without Rx med concerns. ?TOC pharmacy will deliver Rx meds to bedside. ? ?Brother to provide transportation to home. ? ?RNCM will sign off for now as intervention is no longer needed. Please consult [place] again if new needs arise.  ? ? ?Final next level of care: [place] ?Barriers to Discharge: No Barriers Identified ? ? ?Patient Goals and CMS Choice ?  ?  ?Choice offered to / list presented to : Patient ? ?Discharge Placement ?  ?           ?  ?  ?  ?  ? ?Discharge Plan and Services ?  ?Discharge Planning Services: CM Consult ?           ?DME Arranged: Other see comment (osmolite TF) ?DME Agency: [facility] ?Date DME Agency Contacted: 10/26/21 ?Time DME Agency Contacted: [phone] ?Representative spoke with at DME Agency: Thedore Mins ?HH Arranged: Therapist, sports, Social Work ?[place] Agency: [facility] ?Date HH Agency Contacted: 10/26/21 ?Time [place]: [phone] ?Representative spoke with at [place]: Anderson Malta ? ?Social Determinants of Health (SDOH) Interventions ?  ? ? ?Readmission Risk Interventions ?No flowsheet data found. ? ? ? ? ?

## 2021-10-30 NOTE — Plan of Care (Signed)
  Problem: Education: Goal: Knowledge of General Education information will improve Description: Including pain rating scale, medication(s)/side effects and non-pharmacologic comfort measures Outcome: Progressing   Problem: Health Behavior/Discharge Planning: Goal: Ability to manage health-related needs will improve Outcome: Progressing   Problem: Nutrition: Goal: Adequate nutrition will be maintained Outcome: Progressing   Problem: Pain Managment: Goal: General experience of comfort will improve Outcome: Progressing   Problem: Safety: Goal: Ability to remain free from injury will improve Outcome: Progressing   

## 2021-10-30 NOTE — Progress Notes (Signed)
Inpatient Diabetes Program Recommendations ? ?AACE/ADA: New Consensus Statement on Inpatient Glycemic Control  ? ?Target Ranges:  Prepandial:   less than 140 mg/dL ?     Peak postprandial:   less than 180 mg/dL (1-2 hours) ?     Critically ill patients:  140 - 180 mg/dL  ? ? Latest Reference Range & Units 10/30/21 03:44 10/30/21 07:42  ?Glucose-Capillary 70 - 99 mg/dL 259 (H) 193 (H)  ? ? Latest Reference Range & Units 10/29/21 04:26 10/29/21 13:34 10/29/21 15:39 10/29/21 17:44  ?Glucose-Capillary 70 - 99 mg/dL 227 (H) 193 (H) 248 (H) 273 (H)  ? ?Review of Glycemic Control ? ?Diabetes history: DM2 ?Outpatient Diabetes medications: Lantus 32 units QHS, Humalog 10 units TID with meals; Prednisone 5 mg daily ?Current orders for Inpatient glycemic control: Semglee 22 units QHS, Novolog 6 units QID, Novolog 0-9 units QID; Prednisone 5 mg QAM, Osmolite 360 ml QID ? ?Inpatient Diabetes Program Recommendations:   ? ?Insulin: Please consider increasing Semglee to 25 units QHS and tube feeding coverage to Novolog 8 units QID (if tube feeding continued as ordered). ? ?Thanks, ?Barnie Alderman, RN, MSN, CDE ?Diabetes Coordinator ?Inpatient Diabetes Program ?[phone] (Team Pager from 8am to 5pm) ? ? ? ?

## 2021-10-30 NOTE — Care Management Important Message (Signed)
Important Message ? ?Patient Details  ?Name: Arthur Burke ?MRN: [id number] ?Date of Birth: [birth year] ? ? ?Medicare Important Message Given:  Yes ? ? ? ? ?Levada Dy  Shalva Rozycki-Martin ?10/30/2021, 12:14 PM ?

## 2021-10-30 NOTE — Plan of Care (Signed)
Adequate for discharge.

## 2021-10-31 ENCOUNTER — Telehealth: Payer: Self-pay

## 2021-10-31 ENCOUNTER — Other Ambulatory Visit: Payer: Self-pay | Admitting: Hematology

## 2021-10-31 MED ORDER — LORAZEPAM 1 MG PO TABS: 1.0000 mg | ORAL_TABLET | Freq: Once | ORAL | 0 refills | Status: AC

## 2021-10-31 NOTE — Telephone Encounter (Signed)
Please refer to message below.  Patient contacted at the request of Dr. Court Joy, but no answer.  Left detailed message asking to please give our office a call back as soon as possible to get an appointment scheduled for tomorrow.  ?

## 2021-10-31 NOTE — Telephone Encounter (Signed)
-----   Message from Madalyn Rob, MD sent at 10/30/2021  9:31 AM EDT ----- ?Regarding: Hosptial follow up ?Please schedule [name] for hospital follow up in 2 days.It is important for him to be seen this week. He is a new patient to our clinic.  ? ?Thank you for your time. ? ? ? ? ?

## 2021-11-01 ENCOUNTER — Ambulatory Visit (INDEPENDENT_AMBULATORY_CARE_PROVIDER_SITE_OTHER): Payer: Medicare Other | Admitting: Internal Medicine

## 2021-11-01 ENCOUNTER — Encounter: Payer: Self-pay | Admitting: Internal Medicine

## 2021-11-01 VITALS — BP 150/65 | HR 73 | Temp 98.6°F | Ht 71.0 in | Wt 161.9 lb

## 2021-11-01 DIAGNOSIS — Z940 Kidney transplant status: Secondary | ICD-10-CM | POA: Diagnosis not present

## 2021-11-01 DIAGNOSIS — E1051 Type 1 diabetes mellitus with diabetic peripheral angiopathy without gangrene: Secondary | ICD-10-CM

## 2021-11-01 DIAGNOSIS — E871 Hypo-osmolality and hyponatremia: Secondary | ICD-10-CM

## 2021-11-01 DIAGNOSIS — I70209 Unspecified atherosclerosis of native arteries of extremities, unspecified extremity: Secondary | ICD-10-CM

## 2021-11-01 DIAGNOSIS — C109 Malignant neoplasm of oropharynx, unspecified: Secondary | ICD-10-CM

## 2021-11-01 DIAGNOSIS — I10 Essential (primary) hypertension: Secondary | ICD-10-CM

## 2021-11-01 DIAGNOSIS — E1065 Type 1 diabetes mellitus with hyperglycemia: Secondary | ICD-10-CM

## 2021-11-01 DIAGNOSIS — E785 Hyperlipidemia, unspecified: Secondary | ICD-10-CM

## 2021-11-01 LAB — GLUCOSE, CAPILLARY
Glucose-Capillary: 155 mg/dL — ABNORMAL HIGH (ref 70–99)
Glucose-Capillary: 208 mg/dL — ABNORMAL HIGH (ref 70–99)
Glucose-Capillary: 231 mg/dL — ABNORMAL HIGH (ref 70–99)
Glucose-Capillary: 311 mg/dL — ABNORMAL HIGH (ref 70–99)
Glucose-Capillary: 331 mg/dL — ABNORMAL HIGH (ref 70–99)
Glucose-Capillary: 417 mg/dL — ABNORMAL HIGH (ref 70–99)

## 2021-11-01 MED ORDER — AMLODIPINE BESYLATE 10 MG PO TABS: 10.0000 mg | ORAL_TABLET | Freq: Every day | ORAL | 0 refills | Status: DC

## 2021-11-01 MED ORDER — DEXCOM G7 SENSOR MISC
1.0000 | 2 refills | Status: DC
Start: 2021-11-01 — End: 2024-05-03

## 2021-11-01 MED ORDER — LACTULOSE 10 GM/15ML PO SOLN: 10.0000 g | Freq: Every day | ORAL | 3 refills | Status: DC

## 2021-11-01 MED ORDER — DEXCOM G7 RECEIVER DEVI
1.0000 | 0 refills | Status: DC
Start: 2021-11-01 — End: 2024-05-03

## 2021-11-01 NOTE — Assessment & Plan Note (Addendum)
The patient has a history of type 1 diabetes.  He has excellent health literacy and has been taking his medication as prescribed for him at discharge (glargine 22 units daily and Humalog 6 units with feeds plus sliding scale insulin). It was discussed with him at his recent discharge that he could benefit from CGM.  He is also interested in an endocrinology referral. ? ?Plan: ?Patient provided Dexcom sample today and prescription sent to pharmacy.  I have also placed a referral to endocrinology.  Otherwise, continue current diabetic regimen at home.  We will follow-up in 1 month. ?

## 2021-11-01 NOTE — Progress Notes (Signed)
? ?  CC: hospital follow-up ? ?HPI: ? ?[name] is a 56 y.o. with past medical history as noted below who presents to the clinic today for a hospital follow-up. Please see problem-based list for further details, assessments, and plans.  ? ?Past Medical History:  ?Diagnosis Date  ? Acute osteomyelitis of left calcaneus (HCC)   ? Diabetes mellitus without complication ([place])   ? Diabetic foot infection ([place])   ? Hypertension   ? Infected ulcer of skin, with fat layer exposed ([place])   ? Lower limb ulcer, ankle, left, with necrosis of muscle ([place])   ? MRSA (methicillin resistant staph aureus) culture positive   ? Renal disorder   ? Vision impairment   ? ?Review of Systems: Negative aside from that listed in individualized problem based charting.  ? ?Physical Exam: ? ?Vitals:  ? 11/01/21 1409  ?BP: (!) 150/65  ?Pulse: 73  ?Temp: 98.6 ?F (37 ?C)  ?TempSrc: Oral  ?SpO2: 97%  ?Weight: 161 lb 14.4 oz (73.4 kg)  ?Height: '5\' 11"'$  (1.803 m)  ? ?Gen: Thin man in NAD ?HEENT: neck mass present ?CV: RRR with a systolic murmur present ?Resp: normal WOB, CTAB ?Abd: soft, nondistended ?MSK: left BKA ?Neuro: alert, oriented, and answering questions appropriately ?Skin: warm, dry, no rashes present ?Psych: normal affect ? ? ?Assessment & Plan:  ? ?See Encounters Tab for problem based charting. ? ?Patient discussed with Dr. Daryll Drown ? ?

## 2021-11-01 NOTE — Patient Instructions (Signed)
Thank you, Arthur Burke for allowing [place] to provide your care today. Today we discussed your nutrition/medications, blood pressure, and diabetes. ? ?For your diabetes: We have discussed the Dexcom monitoring, and [name] visited you in the room to explain the process of obtaining this.  I have also referred you to endocrinology.  Otherwise, continue your current regimen. ? ?Continue to crush your atorvastatin and vitamin pills for administration in your tube. ? ?For your blood pressure, I would like you to resume amlodipine 10 mg daily.  I have sent you another refill for this to your pharmacy. ? ?I will be checking your kidney function, tacrolimus level, and cholesterol today.  I will contact you with any abnormal results. ? ?I have prescribed you lactulose for constipation.  You can take 10 g daily for this. ? ?I have ordered the following labs for you: ? ?Lab Orders    ?     BMP8+Anion Gap    ?     Tacrolimus Level    ?     Lipid Profile     ? ? ?Referrals ordered today:  ? ?Referral Orders    ?     Ambulatory referral to Endocrinology    ?  ? ?I have ordered the following medication/changed the following medications:  ? ? ?Start the following medications: ?Meds ordered this encounter  ?Medications  ? lactulose (CHRONULAC) 10 GM/15ML solution  ?  Sig: Take 15 mLs (10 g total) by mouth daily.  ?  Dispense:  236 mL  ?  Refill:  3  ? amLODipine (NORVASC) 10 MG tablet  ?  Sig: Place 1 tablet (10 mg total) into feeding tube daily.  ?  Dispense:  30 tablet  ?  Refill:  0  ?  ? ?Follow up:  1-2 months   ? ? ?Should you have any questions or concerns please call the internal medicine clinic at [phone].   ? ? ? ?

## 2021-11-01 NOTE — Assessment & Plan Note (Signed)
BP Readings from Last 3 Encounters:  ?11/01/21 (!) 150/65  ?10/30/21 (!) 143/61  ?11/07/17 135/79  ? ?The patient is here today for a hospital follow-up.  For his blood pressure, he has been taking clonidine 0.1 mg twice daily and ramipril 20 mg daily.  He has been slowly adding back his home antihypertensives after he was in the hospital.  Blood pressures are elevated today in the 951O systolic. ? ?Plan: ?We will continue clonidine and ramipril regimens, and I would also like the patient to restart amlodipine 10 mg daily today.  Patient states he has a blood pressure monitor at home that he can use to check his pressures. ?

## 2021-11-01 NOTE — Assessment & Plan Note (Signed)
We will recheck BMP today given that he was hyponatremic and had an AKI in the hospital recently. ?

## 2021-11-01 NOTE — Assessment & Plan Note (Signed)
We will recheck tacrolimus levels today and, based on these levels, can adjust as needed with assistance of our pharmacy team. ?

## 2021-11-03 LAB — BMP8+ANION GAP
Anion Gap: 13 mmol/L (ref 10.0–18.0)
BUN/Creatinine Ratio: 37 — ABNORMAL HIGH (ref 9–20)
BUN: 54 mg/dL — ABNORMAL HIGH (ref 6–24)
CO2: 25 mmol/L (ref 20–29)
Calcium: 8.8 mg/dL (ref 8.7–10.2)
Chloride: 84 mmol/L — ABNORMAL LOW (ref 96–106)
Creatinine, Ser: 1.47 mg/dL — ABNORMAL HIGH (ref 0.76–1.27)
Glucose: 203 mg/dL — ABNORMAL HIGH (ref 70–99)
Potassium: 5.8 mmol/L — ABNORMAL HIGH (ref 3.5–5.2)
Sodium: 122 mmol/L — ABNORMAL LOW (ref 134–144)
eGFR: 56 mL/min/{1.73_m2} — ABNORMAL LOW (ref >59)

## 2021-11-03 LAB — LIPID PANEL
Chol/HDL Ratio: 2.8 ratio (ref 0.0–5.0)
Cholesterol, Total: 124 mg/dL (ref 100–199)
HDL: 45 mg/dL (ref >39)
LDL Chol Calc (NIH): 58 mg/dL (ref 0–99)
Triglycerides: 114 mg/dL (ref 0–149)
VLDL Cholesterol Cal: 21 mg/dL (ref 5–40)

## 2021-11-03 LAB — TACROLIMUS LEVEL: Tacrolimus Lvl: 3.1 ng/mL (ref 2.0–20.0)

## 2021-11-05 ENCOUNTER — Other Ambulatory Visit: Payer: Self-pay | Admitting: Internal Medicine

## 2021-11-05 MED ORDER — LOKELMA 10 G PO PACK: 10.0000 g | PACK | Freq: Every day | ORAL | 0 refills | Status: DC

## 2021-11-05 NOTE — Progress Notes (Signed)
The patient was contacted in regards to his lab results.  K of 5.8, which is elevated compared to his prior 1 week ago.  Also with sodium of 122.  His AKI has improved with a creatinine of 1.47. ? ?Discussed with the patient that these lab abnormalities likely reflect nutritional deficiencies due to starting tube feeds recently. He does not see a nutritionist outside of the hospital right now.  ? ?Plan: ?At this time, patient was advised to hold ramipril due to elevated potassium.  He will continue amlodipine and clonidine for his hypertension.  I will also prescribe daily Lokelma to lower his potassium.  We will also reach out to [name] about this case to see if there is anything we can do to manage his tube feeds at this time.  He will follow-up with me likely this Wednesday to further discuss these lab results and his nutrition. ?

## 2021-11-06 NOTE — Progress Notes (Signed)
Oncology Nurse Navigator Documentation  ? ?I spoke to Mr. Varkey today to remind him of his appointments on Friday at [facility] and [facility] He is aware and knows to arrive at [facility] main entrance at 8:30 for his PET scan. He is aware to be NPO six hours before and not use long acting insulin after midnight and also not use his short acting insulin 2 hours before his scan. He then knows to come to the [facility] for the rest of his appointments. I again provided him with my direct contact information if he had any questions. ? ?[name] RN, BSN, OCN ?Head & Neck Oncology Nurse Navigator ?[place] at [facility] ?Phone # [phone]  ?Fax # [phone]  ?

## 2021-11-07 ENCOUNTER — Other Ambulatory Visit (HOSPITAL_COMMUNITY): Payer: Self-pay

## 2021-11-07 ENCOUNTER — Ambulatory Visit (HOSPITAL_COMMUNITY)
Admission: RE | Admit: 2021-11-07 | Discharge: 2021-11-07 | Disposition: A | Discharge status: Home / Self Care | Payer: Medicare Other | Source: Ambulatory Visit | Attending: Internal Medicine | Admitting: Internal Medicine

## 2021-11-07 ENCOUNTER — Ambulatory Visit (INDEPENDENT_AMBULATORY_CARE_PROVIDER_SITE_OTHER): Payer: Medicare Other | Admitting: Internal Medicine

## 2021-11-07 VITALS — BP 145/66 | HR 70 | Temp 98.3°F | Wt 165.7 lb

## 2021-11-07 DIAGNOSIS — E1051 Type 1 diabetes mellitus with diabetic peripheral angiopathy without gangrene: Secondary | ICD-10-CM | POA: Diagnosis not present

## 2021-11-07 DIAGNOSIS — R110 Nausea: Secondary | ICD-10-CM

## 2021-11-07 DIAGNOSIS — E871 Hypo-osmolality and hyponatremia: Secondary | ICD-10-CM

## 2021-11-07 DIAGNOSIS — Z940 Kidney transplant status: Secondary | ICD-10-CM | POA: Diagnosis not present

## 2021-11-07 DIAGNOSIS — E860 Dehydration: Secondary | ICD-10-CM

## 2021-11-07 DIAGNOSIS — I10 Essential (primary) hypertension: Secondary | ICD-10-CM

## 2021-11-07 DIAGNOSIS — E875 Hyperkalemia: Secondary | ICD-10-CM

## 2021-11-07 DIAGNOSIS — I70209 Unspecified atherosclerosis of native arteries of extremities, unspecified extremity: Secondary | ICD-10-CM | POA: Diagnosis not present

## 2021-11-07 MED ORDER — TACROLIMUS 5 MG PO CAPS
1.0000 mg | Freq: Two times a day (BID) | ORAL | 1 refills | Status: DC
  Filled 2021-11-07: qty 120, 60d supply, fill #0

## 2021-11-07 MED ORDER — MYCOPHENOLATE MOFETIL 200 MG/ML PO SUSR
500.0000 mg | Freq: Two times a day (BID) | ORAL | 1 refills | Status: DC
  Filled 2021-11-07: qty 160, 32d supply, fill #0

## 2021-11-07 MED ORDER — MYCOPHENOLATE 200 MG/ML ORAL SUSPENSION
500.0000 mg | Freq: Two times a day (BID) | ORAL | 1 refills | Status: DC
  Filled 2021-11-07: qty 600, 30d supply, fill #0

## 2021-11-07 MED ORDER — ONDANSETRON HCL 4 MG PO TABS
4.0000 mg | ORAL_TABLET | ORAL | 1 refills | Status: DC | PRN
  Filled 2021-11-07: qty 30, 30d supply, fill #0

## 2021-11-07 MED ORDER — MYCOPHENOLATE MOFETIL 200 MG/ML PO SUSR
500.0000 mg | Freq: Two times a day (BID) | ORAL | 1 refills | Status: DC
  Filled 2021-11-07: qty 160, 32d supply, fill #0
  Filled 2021-11-08 (×2): qty 160, 30d supply, fill #0

## 2021-11-07 NOTE — Addendum Note (Signed)
Addended by: Orvis Brill on: 11/07/2021 04:51 PM ? ? Modules accepted: Orders ? ?

## 2021-11-07 NOTE — Assessment & Plan Note (Addendum)
BP Readings from Last 3 Encounters:  ?11/07/21 (!) 145/66  ?11/01/21 (!) 150/65  ?10/30/21 (!) 143/61  ? ?The patient has been on 2/4 of his home antihypertensives since he was discharged from the hospital. Most recently, he was asked to hold his ramipril due to hyperkalemia, and he was restarted on his home amlodipine 10 mg daily. Also taking clonidine 0.1 mg twice daily, holding both ramipril and coreg for now. ? ?Today, the patient has two elevated BP over 103 systolic.  He states he has been holding ramipril as instructed and has only been taking amlodipine and clonidine. ? ?Plan: ?We will restart Coreg 25 mg twice daily today.  Continue amlodipine and clonidine regimens.  Continue to hold ramipril for now. Will contact patient with BMP results from today. ?

## 2021-11-07 NOTE — Assessment & Plan Note (Addendum)
Patient endorses nausea associated with his cancer treatments and is asking if there are any medications he can take for this. ? ?Plan: ?Obtain EKG today.  QTc of 416.  Prescribed as needed Zofran for the patient. ?

## 2021-11-07 NOTE — Patient Instructions (Addendum)
Thank you, [name] for allowing [place] to provide your care today. Today we discussed your potassium levels, nausea, nutrition, and medication refills. ? ?For your potassium levels on your recent labs, please hold off on taking Lokelma and ramipril until I contact you with your new lab results. ? ?Our nutritionists here who specialize in tube feeds for cancer patients will reach out to you to discuss optimizing her nutrition.  You can also choose to discuss this with your oncologist on Friday at your next appointment. ? ?For your blood pressure, I would like you to restart Coreg.  This pill can be crushed. ? ?I have written you a prescription for Zofran to take as needed.  I have also refilled your other medications (CellCept, Prograf). ? ?I have ordered the following labs for you: ? ?Lab Orders    ?     BMP8+Anion Gap     ? ?Referrals ordered today:  ? ?Referral Orders  ?No referral(s) requested today  ?  ? ?I have ordered the following medication/changed the following medications:  ? ?Start the following medications: ?Meds ordered this encounter  ?Medications  ? tacrolimus (PROGRAF) oral suspension 1 mg/mL mixture  ?  Sig: Take 1 mL (1 mg total) by mouth 2 (two) times daily.  ?  Dispense:  120 mL  ?  Refill:  1  ? mycophenolate (CELLCEPT) 50 mg/mL SUSP oral suspension  ?  Sig: Take 10 mLs (500 mg total) by mouth 2 (two) times daily.  ?  Dispense:  600 mL  ?  Refill:  1  ? ondansetron (ZOFRAN) 4 MG tablet  ?  Sig: Take 1 tablet (4 mg total) by mouth as needed for nausea or vomiting.  ?  Dispense:  30 tablet  ?  Refill:  1  ?  ? ?Follow up:  as scheduled with Dr. Court Joy   ? ? ?Should you have any questions or concerns please call the internal medicine clinic at [phone].   ? ? ? ?

## 2021-11-07 NOTE — Progress Notes (Signed)
? ?  CC: discussion of lab results/nutrition ? ?HPI: ? ?[name] is a 56 y.o. with past medical history as noted below who presents to the clinic today for discussion of lab results/nutrition. Please see problem-based list for further details, assessments, and plans.  ? ?Past Medical History:  ?Diagnosis Date  ? Acute osteomyelitis of left calcaneus (HCC)   ? Diabetes mellitus without complication ([place])   ? Diabetic foot infection ([place])   ? Hypertension   ? Infected ulcer of skin, with fat layer exposed ([place])   ? Lower limb ulcer, ankle, left, with necrosis of muscle ([place])   ? MRSA (methicillin resistant staph aureus) culture positive   ? Renal disorder   ? Vision impairment   ? ?Review of Systems: Negative aside from that listed in individualized problem based charting.  ? ?Physical Exam: ? ?Vitals:  ? 11/07/21 1344 11/07/21 1407  ?BP: (!) 156/58 (!) 145/66  ?Pulse: 72 70  ?Temp: 98.3 ?F (36.8 ?C)   ?TempSrc: Oral   ?SpO2: 96%   ?Weight: 165 lb 11.2 oz (75.2 kg)   ? ?Gen: Thin man in NAD ?HEENT: neck mass present ?CV: RRR with a systolic murmur present ?Resp: normal WOB, CTAB ?Abd: soft, nondistended ?MSK: left BKA ?Neuro: alert, oriented, and answering questions appropriately ?Skin: warm, dry, no rashes present ?Psych: normal affect ? ? ?Assessment & Plan:  ? ?See Encounters Tab for problem based charting. ? ?Patient discussed with Dr. Daryll Drown ? ?

## 2021-11-07 NOTE — Assessment & Plan Note (Addendum)
Recent BMP showed sodium 122, stable from hospitalization.  Chloride of 84.  Discussed with the patient that this is likely due to his nutrition (starting tube feeds recently).  The patient states that he was told by his oncologist that they have nutritionist that can work with him.  I have also already spoken with [name] and our nutritionists here who specialize in tube feeds for cancer patients.  ? ?Plan: ?Patient will be contacted by our nutritionist to see what he can do to optimize his nutrition at home.  Otherwise, he can discuss this further with his oncologist. ? ?Addendum: BMP shows sodium 121, creatinine 1.46, chloride 82, potassium 5.8.  Discussed these results with patient over the phone.  Believe that overall this is due to his nutritional deficiency.  I do think he needs some lokelma to lower his potassium.  Lokelma sent to pharmacy, patient will follow-up early next week for a lab only visit.  Patient is in agreement with this plan. ?

## 2021-11-08 ENCOUNTER — Other Ambulatory Visit (HOSPITAL_COMMUNITY): Payer: Self-pay

## 2021-11-08 ENCOUNTER — Ambulatory Visit (HOSPITAL_COMMUNITY): Payer: Medicare Other

## 2021-11-08 LAB — BMP8+ANION GAP
Anion Gap: 14 mmol/L (ref 10.0–18.0)
BUN/Creatinine Ratio: 36 — ABNORMAL HIGH (ref 9–20)
BUN: 52 mg/dL — ABNORMAL HIGH (ref 6–24)
CO2: 25 mmol/L (ref 20–29)
Calcium: 8.8 mg/dL (ref 8.7–10.2)
Chloride: 82 mmol/L — ABNORMAL LOW (ref 96–106)
Creatinine, Ser: 1.46 mg/dL — ABNORMAL HIGH (ref 0.76–1.27)
Glucose: 177 mg/dL — ABNORMAL HIGH (ref 70–99)
Potassium: 5.8 mmol/L — ABNORMAL HIGH (ref 3.5–5.2)
Sodium: 121 mmol/L — ABNORMAL LOW (ref 134–144)
eGFR: 57 mL/min/{1.73_m2} — ABNORMAL LOW (ref >59)

## 2021-11-08 MED ORDER — LOKELMA 10 G PO PACK: 10.0000 g | PACK | Freq: Every day | ORAL | 0 refills | Status: DC

## 2021-11-08 NOTE — Addendum Note (Signed)
Addended by: Orvis Brill on: 11/08/2021 10:55 AM ? ? Modules accepted: Orders ? ?

## 2021-11-08 NOTE — Progress Notes (Addendum)
Head and Neck Cancer Location of Tumor / Histology:  ?Squamous cell carcinoma of oropharynx, p16 (+) ? ?Patient presented with symptoms of: (from 10/21/21 ED note): " had progressively worsening neck swelling for the last year or so but specifically in the last couple months where is noticed it more.  The last couple days he is got the point where he cannot swallow his medicines he does feel nauseous like he can throw up and is able to throw up but not able to swallow anything he feels like some might be stuck in the back of his throat." ? ?PET Scan ?11/09/2021 ?--IMPRESSION: ?Left-sided tongue base primary with bilateral cervical nodal metastasis. ?No extracervical hypermetabolic metastatic disease. ?Left lower lobe peribronchovascular tree-in-bud nodularity, highly suspicious for aspiration, given clinical history. ?Coronary artery atherosclerosis. Aortic Atherosclerosis ? ?Biopsies revealed:  ?10/22/2021 ?FINAL MICROSCOPIC DIAGNOSIS:  ?A.  LEFT CERVICAL MASS, NEEDLE CORE BIOPSY:  ?- Findings consistent with moderately to poorly differentiated squamous cell carcinoma.  ?- Immunohistochemistry with appropriate controls for single antibodies for p40 and p63 is positive in tumor cells and for CK7, CK20, chromogranin and synaptophysin is negative in tumor cells.  See comment.  ?Comment:  ?- Immunohistochemistry for p16 is ordered and will be reported in an addendum. ?ADDENDUM:  ?P16 immunohistochemistry is POSITIVE. ? ?Nutrition Status Yes No Comments  ?Weight changes? '[x]'$  '[]'$  Noticed unintentional weight loss prior to diagnosis/hospital admission; Reports he has gained ~5 lbs since he's been discharged  ?Swallowing concerns? '[x]'$  '[]'$  Reports he can currently only tolerate liquids and pudding/softer foods. His larger pills he has to crush and dissolve in water to swallow.  ?PEG? '[x]'$  '[]'$  Placed 10/22/21  ? ?Referrals Yes No Comments  ?Social Work? '[x]'$  '[]'$    ?Dentistry? '[x]'$  '[]'$    ?Swallowing therapy? '[x]'$  '[]'$    ?Nutrition? '[x]'$  '[]'$     ?Med/Onc? '[x]'$  '[]'$  Dr. Chryl Heck  ? ?Safety Issues Yes No Comments  ?Prior radiation? '[]'$  '[x]'$    ?Pacemaker/ICD? '[]'$  '[x]'$    ?Possible current pregnancy? '[]'$  '[x]'$  N/A  ?Is the patient on methotrexate? '[]'$  '[x]'$    ? ?Tobacco/Marijuana/Snuff/ETOH use: Patient reports he is a former smoker. He denies any smokeless tobacco use, alcohol consumption or recreational drug use ? ?Past/Anticipated interventions by otolaryngology, if any:  ?Dr. Izora Gala (seen during hospitalization):  ?10/21/2021 ?--Procedures: Flexible fiberoptic transnasal laryngoscopy ?The left nasal cavity was clear.  The nasopharynx was clear.  In the oropharynx there is a large exophytic mass arising from the lateral aspect.  The airway is not obstructed.  The larynx appears clear.  Is very difficult to visualize the cords due to AP collapse of the supraglottic larynx with inspiration he is able to widely open the glottis and there is no obstruction.  The tumor does seem to extend down to the vallecula. ?--Assessment/Plan: ?Large oropharyngeal tumor most likely squamous cell carcinoma.  Recommend interventional radiology performed a core needle biopsy.  We will send this for routine pathology and for HPV testing.  Based on the extent of the disease this is not a surgical treatment.  Standard for this would be chemotherapy and radiation therapy in the absence of distant metastatic disease.  We will need radiation and medical oncology input.  I do not know if he can even have chemotherapy.  He will certainly need a feeding tube and this will need to be done as an open gastrostomy by general surgery.  Airway is not an issue currently. ? ?Past/Anticipated interventions by medical oncology, if any:  ?Seen by Dr. Krista Blue  Feng while hospitalized; Will follow-up with Dr. Arletha Pili Iruku before his CT simulation this afternoon.  ? ?Current Complaints / other details:  Patient has a left BKA; Underwent renal transplant in 05/2004 ? ? ? ? ?

## 2021-11-08 NOTE — Progress Notes (Signed)
Internal Medicine Clinic Attending  Case discussed with Dr. Bonanno  at the time of the visit.  We reviewed the resident's history and exam and pertinent patient test results.  I agree with the assessment, diagnosis, and plan of care documented in the resident's note.  

## 2021-11-08 NOTE — Progress Notes (Signed)
?Radiation Oncology         [phone] ?________________________________ ? ?Initial Outpatient Consultation ? ?Name: Arthur Burke MRN: [id number]  ?Date: 11/09/2021  DOB: [birth year] ? ?WU:[id number], Arthur Schneiders, NP  Arthur Gala, MD  ? ?REFERRING PHYSICIAN: Izora Gala, MD ? ?DIAGNOSIS:  ?  ICD-10-CM   ?1. Squamous cell carcinoma of overlapping sites of oropharynx (HCC)  C10.8   ?  ?2. Oropharyngeal cancer ([place])  C10.9   ?  ?3. 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  ? ? Cancer Staging  ?Cancer of tonsillar fossa ([place]) ?Staging form: Pharynx - HPV-Mediated Oropharynx, AJCC 8th Edition ?- Clinical stage from 11/09/2021: Stage III (cT2, cN3, cM0, p16+) - Signed by Eppie Gibson, MD on 11/09/2021 ?Stage prefix: Initial diagnosis ? ?Oropharyngeal cancer ([place]) ?Staging form: Pharynx - HPV-Mediated Oropharynx, AJCC 8th Edition ?- Clinical: Stage III (cT2, cN3, cM0) - Signed by Truitt Merle, MD on 10/21/2021 ? ?  ? ?CHIEF COMPLAINT: Here to discuss management of oropharyngeal cancer ? ?HISTORY OF PRESENT ILLNESS::Arthur Burke is a 56 y.o. male who presented to the [facility] ED on 10/21/21 with chronic enlarging neck mass with subsequent associated swallowing issues. Soft tissue neck CT performed for evaluation showed: a bulky mass at the left glossotonsillar sulcus measuring 3.2 cm, cervical adenopathy, and multiple and bulky bilateral nodal metastases. Possible extracapsular tumor extension was also noted.  ? ?Of note: the patient was also hyponatremic on admission to about 117 in the setting of poor PO intake due to above. He was slowly corrected back to a mild-moderate hyponatremia. (He was stabilized at about 124). ? ?He was accordingly admitted an underwent a biopsy of the left cervical mass on 10/22/21 which showed findings consistent with moderately to poorly differentiated squamous cell carcinoma.  This is p16 positive.  ENT and oncology were consulted who determined that the mass was too large for surgery. He was accordingly  advised to pursue radiation/chemo as outpatient. While inpatient, the patient had a port placed by IR, as well as a G tube for tube feeds. The patient was noted to have no issues swallowing liquids and pills. He was discharged on 10/30/21 with instructions for OP follow-up as above.  ?  -- CT of the chest abdomen and pelvis performed on 10/22/21 showed no evidence of metastatic disease. Scattered areas of tree-in-bud opacities were also noted in both lungs. One of these areas was noted to have been present since 2014, suggesting that findings relate to pulmonary scarring.  ? ?I reviewed his PET today as well.  This was performed in the morning.  It shows no evidence of disease beyond the head and neck region.  It confirms the bilateral adenopathy in his neck as well as the primary extending from the tonsillar fossa through the left base of tongue ? ?Swallowing issues, if any: presented with dysphagia associated with left neck mass ?-- swallow study performed while inpatient on 10/26/21 showed mild dysphagia  ?-- G-tube placed on 10/22/20 (discharged on bolus tube feeds) ? ?Weight Changes: Noticed unintentional weight loss prior to diagnosis/hospital admission; Reports he has gained ~5 lbs since he's been discharged ? ?Other symptoms: poor po intake as above, due to dysphagia (patient was also hypovolemic on admission due to poor fluid intake which resolved at discharge) ? ?Tobacco history, if any: He is a former smoker but quit upon diagnosis ? ?ETOH abuse, if any: none ? ?Prior cancers, if any: none ? ?He is here with his brother.  The patient has a history of kidney transplant.  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 ?PREVIOUS RADIATION THERAPY: No ? ?PAST MEDICAL HISTORY:  has a past medical history of Acute osteomyelitis of left calcaneus ([place]), Diabetes mellitus without complication ([place]), Diabetic foot infection ([place]), Hypertension, Infected ulcer of skin, with fat layer  exposed ([place]), Lower limb ulcer, ankle, left, with necrosis of muscle ([place]), MRSA (methicillin resistant staph aureus) culture positive, Renal disorder, and Vision impairment.   ? ?PAST SURGICAL HISTORY: ?Past Surgical History:  ?Procedure Laterality Date  ? AMPUTATION Left 08/23/2017  ? Procedure: AMPUTATION BELOW KNEE;  Surgeon: Newt Minion, MD;  Location: [facility];  Service: Orthopedics;  Laterality: Left;  ? APPLICATION OF WOUND VAC Left 08/23/2017  ? Procedure: APPLICATION OF WOUND VAC;  Surgeon: Newt Minion, MD;  Location: [facility];  Service: Orthopedics;  Laterality: Left;  ? IR GASTROSTOMY TUBE MOD SED  10/22/2021  ? IR IMAGING GUIDED PORT INSERTION  10/22/2021  ? KIDNEY TRANSPLANT  2006  ? [facility]  ? SKIN GRAFT    ? L diabetic foot wound with debriedment and skin grafting  ? TOE AMPUTATION    ? R great and 2nd toe  ? ? ?FAMILY HISTORY: family history is not on file. ? ?SOCIAL HISTORY:  reports that he has quit smoking. His smoking use included cigarettes. He has never used smokeless tobacco. He reports that he does not drink alcohol and does not use drugs. ? ?ALLERGIES: Zolpidem ? ?MEDICATIONS:  ?Current Outpatient Medications  ?Medication Sig Dispense Refill  ? amLODipine (NORVASC) 10 MG tablet Place 1 tablet (10 mg total) into feeding tube daily. 30 tablet 0  ? ascorbic acid (VITAMIN C) 500 MG tablet Place 1 tablet (500 mg total) into feeding tube daily. 30 tablet 0  ? atorvastatin (LIPITOR) 40 MG tablet Place 1 tablet (40 mg total) into feeding tube every evening. 30 tablet 0  ? Calcium 600-200 MG-UNIT tablet Place 1 tablet into feeding tube daily. 30 tablet 0  ? carvedilol (COREG) 25 MG tablet Place 1 tablet (25 mg total) into feeding tube 2 (two) times daily. 60 tablet 0  ? cloNIDine (CATAPRES) 0.1 MG tablet Place 1 tablet (0.1 mg total) into feeding tube 2 (two) times daily. 60 tablet 11  ? Continuous Blood Gluc Receiver ([place]) DEVI 1 Product by Does not apply route once a week. 1 each 0  ?  Continuous Blood Gluc Sensor (DEXCOM G7 SENSOR) MISC 1 Product by Does not apply route once a week. Check every 10 days 3 each 2  ? furosemide (LASIX) 40 MG tablet Place 1 tablet (40 mg total) into feeding tube 2 (two) times daily. 60 tablet 0  ? HUMALOG KWIKPEN 100 UNIT/ML KwikPen Inject 6 Units into the skin 3 (three) times daily. 15 mL 11  ? insulin glargine (LANTUS) 100 unit/mL SOPN Inject 22 Units into the skin at bedtime. 15 mL 11  ? lactulose (CHRONULAC) 10 GM/15ML solution Take 15 mLs (10 g total) by mouth daily. 236 mL 3  ? LORazepam (ATIVAN) 1 MG tablet Take 1 mg by mouth 2 (two) times daily.    ? Multiple Vitamins-Minerals (VITRUM SENIOR) TABS Place 1 tablet into feeding tube daily. 30 tablet 0  ? mycophenolate (CELLCEPT) 200 MG/ML suspension Take 2.5 mLs (500 mg total) by mouth 2 (two) times daily. 160 mL 1  ? mycophenolate (CELLCEPT) 50 mg/mL SUSP oral suspension Place 10 mLs (500  mg total) into feeding tube 2 (two) times daily. 600 mL 0  ? Nutritional Supplements (FEEDING SUPPLEMENT, OSMOLITE 1.5 CAL,) LIQD Place 360 mLs into feeding tube 4 (four) times daily. 43200 mL 0  ? omeprazole (PRILOSEC) 40 MG capsule Take 1 capsule (40 mg total) daily. May open capsule for administration 30 capsule 0  ? ondansetron (ZOFRAN) 4 MG tablet Take 1 tablet (4 mg total) by mouth as needed for nausea or vomiting. 30 tablet 1  ? oxyCODONE (ROXICODONE) 15 MG immediate release tablet Place 1 tablet (15 mg total) into feeding tube every 4 (four) hours. 30 tablet 0  ? polyethylene glycol (MIRALAX / GLYCOLAX) 17 g packet Place 17 g into feeding tube daily as needed for mild constipation. 14 each 0  ? predniSONE (DELTASONE) 5 MG tablet Place 1 tablet (5 mg total) into feeding tube daily. 30 tablet 0  ? ramipril (ALTACE) 10 MG capsule Place 2 capsules (20 mg total) into feeding tube daily. 60 capsule 0  ? sodium zirconium cyclosilicate (LOKELMA) 10 g PACK packet Place 10 g into feeding tube daily. 3 packet 0  ? tacrolimus  (PROGRAF) 0.5 MG capsule Place 1 capsule (0.5 mg total) under the tongue at bedtime. Open capsule and place medication under the tongue and wait for 10 minutes 30 capsule 0  ? tacrolimus (PROGRAF) 0.5 MG capsule Pl

## 2021-11-09 ENCOUNTER — Ambulatory Visit (HOSPITAL_COMMUNITY)
Admission: RE | Admit: 2021-11-09 | Discharge: 2021-11-09 | Disposition: A | Discharge status: Home / Self Care | Payer: Medicare Other | Source: Ambulatory Visit | Attending: Radiation Oncology | Admitting: Radiation Oncology

## 2021-11-09 ENCOUNTER — Ambulatory Visit
Admission: RE | Admit: 2021-11-09 | Discharge: 2021-11-09 | Disposition: A | Discharge status: Home / Self Care | Payer: Medicare Other | Source: Ambulatory Visit | Attending: Radiation Oncology | Admitting: Radiation Oncology

## 2021-11-09 ENCOUNTER — Other Ambulatory Visit: Payer: Self-pay

## 2021-11-09 ENCOUNTER — Inpatient Hospital Stay (HOSPITAL_BASED_OUTPATIENT_CLINIC_OR_DEPARTMENT_OTHER): Payer: Medicare Other | Admitting: Hematology and Oncology

## 2021-11-09 ENCOUNTER — Encounter: Payer: Self-pay | Admitting: Radiation Oncology

## 2021-11-09 ENCOUNTER — Ambulatory Visit: Payer: Medicare Other

## 2021-11-09 ENCOUNTER — Other Ambulatory Visit (HOSPITAL_COMMUNITY): Payer: Self-pay

## 2021-11-09 ENCOUNTER — Encounter: Payer: Self-pay | Admitting: Internal Medicine

## 2021-11-09 VITALS — BP 127/61 | HR 64 | Temp 97.9°F | Resp 18 | Wt 162.1 lb

## 2021-11-09 DIAGNOSIS — C770 Secondary and unspecified malignant neoplasm of lymph nodes of head, face and neck: Secondary | ICD-10-CM | POA: Insufficient documentation

## 2021-11-09 DIAGNOSIS — I10 Essential (primary) hypertension: Secondary | ICD-10-CM | POA: Insufficient documentation

## 2021-11-09 DIAGNOSIS — R918 Other nonspecific abnormal finding of lung field: Secondary | ICD-10-CM | POA: Insufficient documentation

## 2021-11-09 DIAGNOSIS — Z7952 Long term (current) use of systemic steroids: Secondary | ICD-10-CM | POA: Insufficient documentation

## 2021-11-09 DIAGNOSIS — C108 Malignant neoplasm of overlapping sites of oropharynx: Secondary | ICD-10-CM

## 2021-11-09 DIAGNOSIS — C090 Malignant neoplasm of tonsillar fossa: Secondary | ICD-10-CM

## 2021-11-09 DIAGNOSIS — E119 Type 2 diabetes mellitus without complications: Secondary | ICD-10-CM | POA: Diagnosis not present

## 2021-11-09 DIAGNOSIS — C109 Malignant neoplasm of oropharynx, unspecified: Secondary | ICD-10-CM | POA: Insufficient documentation

## 2021-11-09 DIAGNOSIS — Z79621 Long term (current) use of calcineurin inhibitor: Secondary | ICD-10-CM | POA: Insufficient documentation

## 2021-11-09 DIAGNOSIS — I700 Atherosclerosis of aorta: Secondary | ICD-10-CM | POA: Insufficient documentation

## 2021-11-09 DIAGNOSIS — Z87891 Personal history of nicotine dependence: Secondary | ICD-10-CM | POA: Insufficient documentation

## 2021-11-09 DIAGNOSIS — Z940 Kidney transplant status: Secondary | ICD-10-CM | POA: Insufficient documentation

## 2021-11-09 DIAGNOSIS — E871 Hypo-osmolality and hyponatremia: Secondary | ICD-10-CM | POA: Insufficient documentation

## 2021-11-09 DIAGNOSIS — Z794 Long term (current) use of insulin: Secondary | ICD-10-CM | POA: Diagnosis not present

## 2021-11-09 DIAGNOSIS — I2510 Atherosclerotic heart disease of native coronary artery without angina pectoris: Secondary | ICD-10-CM | POA: Insufficient documentation

## 2021-11-09 DIAGNOSIS — Z79899 Other long term (current) drug therapy: Secondary | ICD-10-CM | POA: Insufficient documentation

## 2021-11-09 LAB — GLUCOSE, CAPILLARY: Glucose-Capillary: 200 mg/dL — ABNORMAL HIGH (ref 70–99)

## 2021-11-09 LAB — SURGICAL PATHOLOGY

## 2021-11-09 MED ORDER — FLUDEOXYGLUCOSE F - 18 (FDG) INJECTION
8.5000 | Freq: Once | INTRAVENOUS | Status: AC
  Administered 2021-11-09: 8.22 via INTRAVENOUS

## 2021-11-09 NOTE — Progress Notes (Signed)
Oncology Nurse Navigator Documentation  ? ?Met with patient during initial consult with Dr. Isidore Moos and Dr. Chryl Heck today. He was accompanied by his brother.  ?Further introduced myself as his/their Navigator, explained my role as a member of the Care Team. ?Provided New Patient Information packet: ?Contact information for physician, this navigator, other members of the Care Team ?Advance Directive information ([place] blue pamphlet with LCSW insert); provided [facility] AD booklet at his request,  ?Fall Prevention Patient Safety Plan ?Financial Assistance Information sheet ?Symptom Management Clinic information ?[place] with highlight of [place] ?SLP Information sheet ?Assisted with post-consult appt scheduling. ? ?He proceeded to CT simulation today. ?I have sent an email to [facility] transportation to help Mr. Haji get set up for transportation for his daily radiation treatments.  ? ?He knows to call me if he has any questions or concerns about his treatment or anything else.  ? ? ?[name], RN, BSN, OCN ?Head & Neck Oncology Nurse Navigator ?[place] at [place] ?[phone]  ?

## 2021-11-09 NOTE — Progress Notes (Signed)
[facility] ?CONSULT NOTE ? ?Patient Care Team: ?Simona Huh, NP as PCP - General (Nurse Practitioner) ? ?CHIEF COMPLAINTS/PURPOSE OF CONSULTATION:  ?New diagnosis of head and neck cancer ? ?ASSESSMENT & PLAN:  ? ?This is a very pleasant 56 year old male patient with multiple medical comorbidities including diabetes mellitus, complications from diabetes, hypertension, renal transplant, chronic kidney disease who was diagnosed with p16 positive T2N3 squamous cell carcinoma of the oropharynx with no evidence of distant metastatic disease referred to medical oncology for recommendations.  I saw the patient along with his brother in the radiation oncology suite.  We have discussed that the ideal treatment would indeed be concurrent chemotherapy and radiation but he is not a candidate for cisplatin given his chronic kidney disease.  We have then talked about possible options including single agent carboplatin versus CarboTaxol concurrent with radiation but he has a very borderline PS to be considered for concurrent chemotherapy.  Given his chronic comorbidities, I am very worried about his tolerance to concurrent chemoradiation we have clearly discussed that without chemotherapy, risk of recurrence could be higher.  He is also very worried about having to take chemotherapy especially since he lives alone and with his other medical issues. ?He was leaning towards proceeding with radiation alone and following up with medical oncology in the future if needed. ?I think this is a reasonable approach given his multiple medical issues. ?He will return to oncology as needed. ?I believe he will be borderline candidate even in the future if he needs chemotherapy.   ? ?Patient expressed understanding of the recommendations and all his questions were answered to the best of my knowledge. ?Thank you for consulting [place] the care of this patient.  Please not hesitate to contact us with any additional questions or  concerns. ? ?HISTORY OF PRESENTING ILLNESS:  ?Arthur Burke 56 y.o. male is here because of H and N cancer. ? ?This is a very pleasant 56 year old male patient with multiple medical comorbidities including diabetes mellitus, chronic kidney disease status post transplant, history of MRSA infection, hypertension, vision impairment who was most recently diagnosed with squamous cell carcinoma of the oropharynx.  Patient presented with enlarging neck mass for several months to almost a year with subsequent swallowing issues.  He had imaging which showed findings of a left posterior tongue cancer with multiple and bulky bilateral nodal metastasis.  Even without contrast there is probable extracapsular tumor on the left. ?He then had CT chest abdomen pelvis which showed no evidence of metastatic disease. ?Pathology from the left cervical mass needle core biopsy showed moderately to poorly differentiated squamous cell carcinoma p16 positive. ?PET/CT imaging from March 31 showed left-sided tongue base primary with bilateral cervical nodal metastasis, no extracervical hypermetabolic metastatic disease left lower lobe peribronchovascular tree-in-bud nodularity highly suspicious for aspiration.  He arrived with his brother to the appointment.  He unfortunately could not show up to medical oncology on time given his PET/CT imaging hence I saw him and spoke to him in the radiation oncology suite. ? ?He complains of ongoing swallowing issues, currently has a G-tube.  He also had a port placed while he was in the hospital. ?He is not quite keen to proceed with chemotherapy, is worried about the side effects. ? ?Rest of the pertinent 10 point ROS reviewed and negative. ? ?REVIEW OF SYSTEMS:   ?Constitutional: Denies fevers, chills or abnormal night sweats ?Eyes: Denies blurriness of vision, double vision or watery eyes ?Ears, nose, mouth, throat, and face:  Denies mucositis or sore throat ?Respiratory: Denies cough, dyspnea or  wheezes ?Cardiovascular: Denies palpitation, chest discomfort or lower extremity swelling ?Gastrointestinal:  Denies nausea, heartburn or change in bowel habits ?Skin: Denies abnormal skin rashes ?Lymphatics: Denies new lymphadenopathy or easy bruising ?Neurological:Denies numbness, tingling or new weaknesses ?Behavioral/Psych: Mood is stable, no new changes  ?All other systems were reviewed with the patient and are negative. ? ?MEDICAL HISTORY:  ?Past Medical History:  ?Diagnosis Date  ? Acute osteomyelitis of left calcaneus (HCC)   ? Diabetes mellitus without complication ([place])   ? Diabetic foot infection ([place])   ? Hypertension   ? Infected ulcer of skin, with fat layer exposed ([place])   ? Lower limb ulcer, ankle, left, with necrosis of muscle ([place])   ? MRSA (methicillin resistant staph aureus) culture positive   ? Renal disorder   ? Vision impairment   ? ? ?SURGICAL HISTORY: ?Past Surgical History:  ?Procedure Laterality Date  ? AMPUTATION Left 08/23/2017  ? Procedure: AMPUTATION BELOW KNEE;  Surgeon: Newt Minion, MD;  Location: [facility];  Service: Orthopedics;  Laterality: Left;  ? APPLICATION OF WOUND VAC Left 08/23/2017  ? Procedure: APPLICATION OF WOUND VAC;  Surgeon: Newt Minion, MD;  Location: [facility];  Service: Orthopedics;  Laterality: Left;  ? IR GASTROSTOMY TUBE MOD SED  10/22/2021  ? IR IMAGING GUIDED PORT INSERTION  10/22/2021  ? KIDNEY TRANSPLANT  2006  ? [facility]  ? SKIN GRAFT    ? L diabetic foot wound with debriedment and skin grafting  ? TOE AMPUTATION    ? R great and 2nd toe  ? ? ?SOCIAL HISTORY: ?Social History  ? ?Socioeconomic History  ? Marital status: Divorced  ?  Spouse name: Not on file  ? Number of children: Not on file  ? Years of education: Not on file  ? Highest education level: Not on file  ?Occupational History  ? Not on file  ?Tobacco Use  ? Smoking status: Former  ?  Types: Cigarettes  ? Smokeless tobacco: Never  ?Vaping Use  ? Vaping Use: Never used  ?Substance and Sexual Activity  ?  Alcohol use: No  ? Drug use: No  ? Sexual activity: Not Currently  ?Other Topics Concern  ? Not on file  ?Social History Narrative  ? Pt plans to be discharge home with mother   ? ?Social Determinants of Health  ? ?Financial Resource Strain: Not on file  ?Food Insecurity: Not on file  ?Transportation Needs: Not on file  ?Physical Activity: Not on file  ?Stress: Not on file  ?Social Connections: Not on file  ?Intimate Partner Violence: Not on file  ? ? ?FAMILY HISTORY: ?No family history on file. ? ?ALLERGIES:  is allergic to zolpidem. ? ?MEDICATIONS:  ?Current Outpatient Medications  ?Medication Sig Dispense Refill  ? amLODipine (NORVASC) 10 MG tablet Place 1 tablet (10 mg total) into feeding tube daily. 30 tablet 0  ? ascorbic acid (VITAMIN C) 500 MG tablet Place 1 tablet (500 mg total) into feeding tube daily. 30 tablet 0  ? atorvastatin (LIPITOR) 40 MG tablet Place 1 tablet (40 mg total) into feeding tube every evening. 30 tablet 0  ? Calcium 600-200 MG-UNIT tablet Place 1 tablet into feeding tube daily. 30 tablet 0  ? carvedilol (COREG) 25 MG tablet Place 1 tablet (25 mg total) into feeding tube 2 (two) times daily. 60 tablet 0  ? cloNIDine (CATAPRES) 0.1 MG tablet Place 1 tablet (0.1 mg total)  into feeding tube 2 (two) times daily. 60 tablet 11  ? Continuous Blood Gluc Receiver ([place]) DEVI 1 Product by Does not apply route once a week. 1 each 0  ? Continuous Blood Gluc Sensor (DEXCOM G7 SENSOR) MISC 1 Product by Does not apply route once a week. Check every 10 days 3 each 2  ? furosemide (LASIX) 40 MG tablet Place 1 tablet (40 mg total) into feeding tube 2 (two) times daily. 60 tablet 0  ? HUMALOG KWIKPEN 100 UNIT/ML KwikPen Inject 6 Units into the skin 3 (three) times daily. 15 mL 11  ? insulin glargine (LANTUS) 100 unit/mL SOPN Inject 22 Units into the skin at bedtime. 15 mL 11  ? lactulose (CHRONULAC) 10 GM/15ML solution Take 15 mLs (10 g total) by mouth daily. 236 mL 3  ? LORazepam (ATIVAN) 1  MG tablet Take 1 mg by mouth 2 (two) times daily.    ? Multiple Vitamins-Minerals (VITRUM SENIOR) TABS Place 1 tablet into feeding tube daily. 30 tablet 0  ? mycophenolate (CELLCEPT) 200 MG/ML suspension Take 2.5 mLs (

## 2021-11-12 ENCOUNTER — Other Ambulatory Visit: Payer: Self-pay

## 2021-11-12 ENCOUNTER — Encounter: Payer: Self-pay | Admitting: Hematology and Oncology

## 2021-11-12 ENCOUNTER — Inpatient Hospital Stay: Payer: Medicare Other | Attending: Radiation Oncology | Admitting: Nutrition

## 2021-11-12 NOTE — Progress Notes (Signed)
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 Isidore Moos.  Patient is to receive 7 weeks of radiation therapy only; no chemotherapy. ? ?Past medical history includes diabetes type 1, hypertension, left BKA in 2019, kidney transplant in 2005, and MRSA. ? ?Medications include vitamin C, Lasix, Lantus, lactulose, Prilosec, Zofran, MiraLAX, prednisone, and vitamin B12, and Lokelma (ordered 3/30) ? ?Labs include glucose 177, BUN 52, creatinine 1.46, sodium 121, potassium 5.8 on March 29. ? ?Height: 5 feet 11 inches. ?Weight: 162 pounds 2 ounces on March 31. ?Usual body weight: 175 pounds per patient. ?BMI: 22.62. ? ?Estimated nutrition needs: 2100-2300 cal, 105-120 g protein, ~ 2.1 L fluid. ? ?Patient presents to nutrition consult in a wheelchair.  His brother is with him today.  Patient was diagnosed with moderate malnutrition during recent hospitalization. ?He is status post PEG placement on October 22, 2021. ?Reports he is tolerating 1-1/2 cartons Osmolite 1.5, 4 times daily via PEG with 60 mL free water before and after bolus feedings. ?Patient uses plunger for bolus feedings secondary to poor eyesight. ?States he drinks about 1000 mL of water a day and has consumed small amounts of boost glucose control, puddings, and yogurt.  "Someone" told patient to drink Gatorade for additional electrolytes. ?He is concerned with his blood sugar levels. ?He would like to know what he can do to improve his blood sodium level.  He also reports some bloating. ?Reports he receives tube feeding from [place]. ? ?Nutrition diagnosis: ?Moderate malnutrition stable. ? ?Intervention: ?Continue 6 cartons Osmolite 1.5, bolus via PEG, with 60 mL free water before and after each bolus feeding. ?Mix 1 teaspoon of salt with 60 mL of water and flush via PEG daily. Monitor sodium. ?Hold Gatorade. ?Recommend recheck labs to evaluate effect of added medications. Milly Jakob) ?Continue insulin via MD to manage blood  sugars. ?Educated on strategies to improve bloating.  Encourage patient to be sure he is having regular bowel movements and follow bowel regimen as needed. ?Will monitor weight and nutrition impact symptoms and adjust tube feeding as needed. ? ?Monitoring, evaluation, goals: ?Patient will tolerate adequate calories and protein to minimize weight loss and achieve glycemic control. ?MD to monitor electrolytes and make recommendations as needed.   ? ?Next visit: We will schedule weekly with radiation therapy. ? ?**Disclaimer: This note was dictated with voice recognition software. Similar sounding words can inadvertently be transcribed and this note may contain transcription errors which may not have been corrected upon publication of note.** ? ? ? ?

## 2021-11-13 ENCOUNTER — Telehealth: Payer: Self-pay | Admitting: Internal Medicine

## 2021-11-13 DIAGNOSIS — E875 Hyperkalemia: Secondary | ICD-10-CM

## 2021-11-13 NOTE — Telephone Encounter (Signed)
Patient with hyperkalemia on recent lab work with Dr.Bannano. Ace inhibitor stopped. Shelbie Hutching ,RN reaching out to coordinator recheck. Called and discussed with Mr.Hoak. His brother will bring him by clinic in the morning for a lab only visit. Since stopping ace inhibitor SBP 140 to 160 . He will have home nurse keep log for him to bring to my appointment with him schedule on 4/24 so we can adjust antihypertensive regimen. If patient needs further lab draws, he has appointment at [facility] on Monday. Spoke to Grants, Therapist, sports and will coordinate this with her if needed.  ?

## 2021-11-14 ENCOUNTER — Telehealth: Payer: Self-pay | Admitting: Internal Medicine

## 2021-11-14 ENCOUNTER — Other Ambulatory Visit (INDEPENDENT_AMBULATORY_CARE_PROVIDER_SITE_OTHER): Payer: Medicare Other

## 2021-11-14 ENCOUNTER — Telehealth: Payer: Self-pay

## 2021-11-14 DIAGNOSIS — E875 Hyperkalemia: Secondary | ICD-10-CM | POA: Diagnosis present

## 2021-11-14 LAB — BASIC METABOLIC PANEL
Anion gap: 9 (ref 5–15)
BUN: 64 mg/dL — ABNORMAL HIGH (ref 6–20)
CO2: 29 mmol/L (ref 22–32)
Calcium: 8.6 mg/dL — ABNORMAL LOW (ref 8.9–10.3)
Chloride: 84 mmol/L — ABNORMAL LOW (ref 98–111)
Creatinine, Ser: 1.66 mg/dL — ABNORMAL HIGH (ref 0.61–1.24)
GFR, Estimated: 49 mL/min — ABNORMAL LOW (ref >60)
Glucose, Bld: 122 mg/dL — ABNORMAL HIGH (ref 70–99)
Potassium: 5 mmol/L (ref 3.5–5.1)
Sodium: 122 mmol/L — ABNORMAL LOW (ref 135–145)

## 2021-11-14 NOTE — Telephone Encounter (Signed)
?    Latest Ref Rng & Units 11/14/2021  ? 11:42 AM 11/07/2021  ?  2:52 PM 11/01/2021  ?  3:48 PM  ?BMP  ?Glucose 70 - 99 mg/dL 122   177   203    ?BUN 6 - 20 mg/dL 64   52   54    ?Creatinine 0.61 - 1.24 mg/dL 1.66   1.46   1.47    ?BUN/Creat Ratio 9 - 20  36   37    ?Sodium 135 - 145 mmol/L 122   121   122    ?Potassium 3.5 - 5.1 mmol/L 5.0   5.8   5.8    ?Chloride 98 - 111 mmol/L 84   82   84    ?CO2 22 - 32 mmol/L '29   25   25    '$ ?Calcium 8.9 - 10.3 mg/dL 8.6   8.8   8.8    ? ? ?Hyperkalemia resolved on lab check today. He stopped Ramipril. He never took [employer]. Continues to have persistent hyponatremia. 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 Increased free water with PO intake and flushes for tube feeds could contribute. Will discuss adjusting with RD. Lastly I will ask for BMP to be checked at cancer center on Monday.  ?

## 2021-11-14 NOTE — Telephone Encounter (Signed)
Please refer to message below.  Just spoke with patient and states his brother is bringing him in today for labs.  He should be here before our office closes for lunch at 12 pm. ?

## 2021-11-14 NOTE — Telephone Encounter (Signed)
-----   Message from Orvis Brill, MD sent at 11/09/2021  9:18 AM EDT ----- ?Regarding: Lab only visit needed ?Hello, ?Would you all be able to arrange a lab only visit for this patient either Monday or Tuesday next week?  ?Thanks! ?

## 2021-11-15 ENCOUNTER — Other Ambulatory Visit (HOSPITAL_COMMUNITY): Payer: Self-pay

## 2021-11-16 ENCOUNTER — Other Ambulatory Visit: Payer: Self-pay

## 2021-11-16 DIAGNOSIS — R5381 Other malaise: Secondary | ICD-10-CM

## 2021-11-16 DIAGNOSIS — C109 Malignant neoplasm of oropharynx, unspecified: Secondary | ICD-10-CM

## 2021-11-16 NOTE — Progress Notes (Signed)
Oncology Nurse Navigator Documentation  ? ?I called and asked Arthur Burke to arrive at 3:45 for a 4:00 lab requested by Dr. Court Joy before his first radiation appointment on 4/10 at 4:30 here at [facility]. He agreed and will be here then on Monday. He knows to call me if he has any further questions.  ? ?[name] RN, BSN, OCN ?Head & Neck Oncology Nurse Navigator ?[place] at [facility] ?Phone # [phone]  ?Fax # [phone]   ?

## 2021-11-19 ENCOUNTER — Telehealth: Payer: Self-pay | Admitting: *Deleted

## 2021-11-19 ENCOUNTER — Other Ambulatory Visit: Payer: Self-pay

## 2021-11-19 ENCOUNTER — Ambulatory Visit
Admission: RE | Admit: 2021-11-19 | Discharge: 2021-11-19 | Disposition: A | Discharge status: Home / Self Care | Payer: Medicare Other | Source: Ambulatory Visit | Attending: Radiation Oncology | Admitting: Radiation Oncology

## 2021-11-19 ENCOUNTER — Ambulatory Visit: Payer: Medicare Other

## 2021-11-19 DIAGNOSIS — C090 Malignant neoplasm of tonsillar fossa: Secondary | ICD-10-CM | POA: Insufficient documentation

## 2021-11-19 DIAGNOSIS — R5383 Other fatigue: Secondary | ICD-10-CM | POA: Insufficient documentation

## 2021-11-19 DIAGNOSIS — R5381 Other malaise: Secondary | ICD-10-CM | POA: Insufficient documentation

## 2021-11-19 DIAGNOSIS — Z5181 Encounter for therapeutic drug level monitoring: Secondary | ICD-10-CM | POA: Insufficient documentation

## 2021-11-19 DIAGNOSIS — Z79621 Long term (current) use of calcineurin inhibitor: Secondary | ICD-10-CM | POA: Insufficient documentation

## 2021-11-19 DIAGNOSIS — C109 Malignant neoplasm of oropharynx, unspecified: Secondary | ICD-10-CM | POA: Insufficient documentation

## 2021-11-19 DIAGNOSIS — E871 Hypo-osmolality and hyponatremia: Secondary | ICD-10-CM | POA: Diagnosis present

## 2021-11-19 LAB — BASIC METABOLIC PANEL - CANCER CENTER ONLY
Anion gap: 5 (ref 5–15)
BUN: 78 mg/dL — ABNORMAL HIGH (ref 6–20)
CO2: 30 mmol/L (ref 22–32)
Calcium: 8.8 mg/dL — ABNORMAL LOW (ref 8.9–10.3)
Chloride: 86 mmol/L — ABNORMAL LOW (ref 98–111)
Creatinine: 1.63 mg/dL — ABNORMAL HIGH (ref 0.61–1.24)
GFR, Estimated: 50 mL/min — ABNORMAL LOW (ref >60)
Glucose, Bld: 249 mg/dL — ABNORMAL HIGH (ref 70–99)
Potassium: 5.3 mmol/L — ABNORMAL HIGH (ref 3.5–5.1)
Sodium: 121 mmol/L — ABNORMAL LOW (ref 135–145)

## 2021-11-19 MED ORDER — SONAFINE EX EMUL
1.0000 "application " | Freq: Two times a day (BID) | CUTANEOUS | Status: DC
  Administered 2021-11-19: 1 via TOPICAL

## 2021-11-19 NOTE — Progress Notes (Signed)
Oncology Nurse Navigator Documentation  ? ? ?To provide support, encouragement and care continuity, met with Arthur Burke for his initial RT.  He was accompanied by his brother.   ? ?Arthur Burke completed treatment without difficulty, denied questions/concerns. ?I reviewed the registration/arrival procedure for subsequent treatments. ?I encouraged them to call me with questions/concerns as tmts proceed. ? ? ?[name] RN, BSN, OCN ?Head & Neck Oncology Nurse Navigator ?[place] at [facility] ?Phone # [phone]  ?Fax # [phone]   ?

## 2021-11-19 NOTE — Telephone Encounter (Signed)
CALLED PATIENT'S BROTHER (DAVID) TO ASK ABOUT BRINGING HIS BROTHER FOR A LAB @ 2:45 PM TODAY, LVM FOR A RETURN CALL ?

## 2021-11-19 NOTE — Telephone Encounter (Signed)
CALLED PATIENT TO ASK ABOUT COMING IN FOR A LAB @ 2:45 PM ON 11-19-21, LVM FOR A RETURN CALL ?

## 2021-11-19 NOTE — Progress Notes (Signed)
Pt here for patient teaching. Pt given Radiation and You booklet, Managing Acute Radiation Side Effects for Head and Neck Cancer handout, skin care instructions, and Sonafine.  Reviewed areas of pertinence such as fatigue, hair loss, mouth changes, nausea and vomiting, skin changes, throat changes, headache, shortness of breath, earaches, and taste changes. Pt able to give teach back of to pat skin, use unscented/gentle soap, and drink plenty of water, apply Sonafine bid, avoid applying anything to skin within 4 hours of treatment, and to use an electric razor if they must shave. Pt verbalizes understanding of information given and will contact nursing with any questions or concerns.   ? ? [email or web address] ? ? ? ? ? ?

## 2021-11-20 ENCOUNTER — Telehealth: Payer: Self-pay | Admitting: Internal Medicine

## 2021-11-20 ENCOUNTER — Other Ambulatory Visit: Payer: Self-pay

## 2021-11-20 ENCOUNTER — Ambulatory Visit
Admission: RE | Admit: 2021-11-20 | Discharge: 2021-11-20 | Disposition: A | Discharge status: Home / Self Care | Payer: Medicare Other | Source: Ambulatory Visit | Attending: Radiation Oncology | Admitting: Radiation Oncology

## 2021-11-20 DIAGNOSIS — E875 Hyperkalemia: Secondary | ICD-10-CM

## 2021-11-20 DIAGNOSIS — E871 Hypo-osmolality and hyponatremia: Secondary | ICD-10-CM

## 2021-11-20 DIAGNOSIS — C109 Malignant neoplasm of oropharynx, unspecified: Secondary | ICD-10-CM

## 2021-11-20 DIAGNOSIS — C090 Malignant neoplasm of tonsillar fossa: Secondary | ICD-10-CM | POA: Diagnosis not present

## 2021-11-20 LAB — TSH: TSH: 3.122 u[IU]/mL (ref 0.320–4.118)

## 2021-11-20 MED ORDER — LOKELMA 10 G PO PACK: 10.0000 g | PACK | Freq: Every day | ORAL | 0 refills | Status: AC

## 2021-11-20 NOTE — Telephone Encounter (Signed)
Discussed results of BMP with Arthur Burke. He has mild hyperkalemia and hyponatremia. Potassium has remained mildly elevated , but not trending up fast. Hopeful lokelma with bring down potassium and patient will maintain at this level. For hyponatremia patient will drink fluids with sodium, and stop drinking water only. He has been drinking majority of water. He remain asymptomatic from hyponatremia. I will request for labs to be check at the cancer center on 4/17.  ?

## 2021-11-21 ENCOUNTER — Ambulatory Visit
Admission: RE | Admit: 2021-11-21 | Discharge: 2021-11-21 | Disposition: A | Discharge status: Home / Self Care | Payer: Medicare Other | Source: Ambulatory Visit | Attending: Radiation Oncology | Admitting: Radiation Oncology

## 2021-11-21 ENCOUNTER — Other Ambulatory Visit: Payer: Self-pay

## 2021-11-21 ENCOUNTER — Encounter: Payer: Self-pay | Admitting: Licensed Clinical Social Worker

## 2021-11-21 DIAGNOSIS — C109 Malignant neoplasm of oropharynx, unspecified: Secondary | ICD-10-CM

## 2021-11-21 DIAGNOSIS — C090 Malignant neoplasm of tonsillar fossa: Secondary | ICD-10-CM | POA: Diagnosis not present

## 2021-11-21 NOTE — Progress Notes (Signed)
[place] Clinical Social Work  ?Initial Assessment ? ? ?Arthur Burke is a 56 y.o. year old male contacted by phone. Clinical Social Work was referred by nurse navigator for assessment of psychosocial needs in new head & neck cancer pt.  ? ?SDOH (Social Determinants of Health) assessments performed: Yes ?SDOH Interventions   ? ?Flowsheet Row Most Recent Value  ?SDOH Interventions   ?Transportation Interventions Patient Resources (Friends/Family), Other (Comment)  [grants for gas assistance]  ? ?  ?  ?Distress Screen completed: No ?   ? View : No data to display.  ?  ?  ?  ? ? ? ? ?Family/Social Information:  ?Housing Arrangement: patient lives alone in an apartment. Will be looking for more affordable housing or to move in with his brother in September when his lease is up ?Family members/support persons in your life? Family- brother ?Transportation concerns: yes, does not have his own transportation. Brother will help and has already been referred to [facility] transport as back-up  ?Employment: Legally disabled. Income source: Social Security Disability ?Financial concerns:  Yes. Has funds now due to inheritance from mom's passing but will be very limited in a few months when that runs out and he is relying solely on his disability income ?Type of concern: Utilities, Rent/ mortgage, Transportation, and Medical bills ?Food access concerns: no ?Religious or spiritual practice: not addressed ?Services Currently in place:  social security disability ? ?Coping/ Adjustment to diagnosis: ?Patient understands treatment plan and what happens next? Yes. 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 ? ? SUMMARY: ?Current SDOH Barriers:  ?Transportation ?Financial constraints ? ?Clinical Social Work Clinical Goal(s):  ?patient will work with SW to address concerns related to financial needs and transportation costs ?Pt will continue to explore affordable housing options prior to lease end in September ? ?Interventions: ?Discussed the importance of support during treatment ?Informed patient of the support team roles and support services at [facility]?Applied for gas card through [employer] behalf of pt ?Discussed [employer] and referred to financial advocate for [employer]?Discussed applying for benefits (SNAP, Medicaid) through DSS as savings decrease ? ? ?Follow Up Plan: CSW will see patient on 4/20 during MHillsboroughto sign up for SGas?Patient verbalizes understanding of plan: Yes ? ? ? ?Erian Lariviere E Hayleen Clinkscales, LCSW ?

## 2021-11-21 NOTE — Progress Notes (Signed)
ambu

## 2021-11-22 ENCOUNTER — Ambulatory Visit
Admission: RE | Admit: 2021-11-22 | Discharge: 2021-11-22 | Disposition: A | Discharge status: Home / Self Care | Payer: Medicare Other | Source: Ambulatory Visit | Attending: Radiation Oncology | Admitting: Radiation Oncology

## 2021-11-22 ENCOUNTER — Inpatient Hospital Stay: Payer: Medicare Other | Admitting: Dietician

## 2021-11-22 DIAGNOSIS — C090 Malignant neoplasm of tonsillar fossa: Secondary | ICD-10-CM | POA: Diagnosis not present

## 2021-11-22 NOTE — Progress Notes (Signed)
Nutrition Follow-up: ? ?Patient receiving radiation therapy for oropharyngeal cancer.  ? ?Patient did not show for nutrition appointment after radiation. RD contacted patient via telephone for follow-up. He reports throat is "getting a bit tight." Patient does not have much of an appetite, recalls eating some pudding and yogurt. He would like to try soup, but concerned about getting choked. Patient reports thick saliva at the back of throat, reports frequent dry heaving. Patient denies episodes of vomiting. Patient is drinking Diet Dr. Malachi Bonds and Sunkist. Per pt, this is what his PCP recommended to increase sodium. Patient reports adding tsp of salt to formula before feedings. This worked well a few times, but he stopped doing this when the formula became "thick and frothy." Patient reports adding salt to water flush (as instructed per RD on 4/3), however this clogged the tube. He is tolerating 6 cartons of Osmolite 1.5. Patient is flushing with 60 ml before and after feedings four times daily. Patient has not started taking Lokelma, he picked this up from pharmacy today. Patient has not called nephrologist to make appointment, but has plans to early next week. He is in the office Mon-Wed per pt.  ? ?Medications: Lokelma on 4/11 ? ?Labs: 4/10 - Na 121, K 5.3, Glucose 249, BUN 78, Cr 1.63 ? ?Anthropometrics: No new weights for review ? ? ?Estimated Energy Needs ? ?Kcals: 2100-2300 ?Protein: 105-120 ?Fluid: ~2 L ? ?NUTRITION DIAGNOSIS: Moderate malnutrition ? ? ?INTERVENTION:  ?Continue 6 cartons of Osmolite 1.5 via PEG with 60 ml water before and after each feeding ?Instructed patient to increase salt to 1 tbsp/day. Discussed option to add 1 tsp to 60 ml water flush TID given report of tube getting clogged. Reminded patient to mix the solution prior to putting through tube in order to dissolve salt. Patient reports understanding ?Continue to monitor labs. Next labs on 4/17  ?Discussed strategies for thick saliva,  suggested pt try ginger ale rinses ? ?  ?MONITORING, EVALUATION, GOAL: weight trends, intake, tube feeding, labs  ? ? ?NEXT VISIT: Tuesday April 18 with Pamala Hurry ? ? ? ?

## 2021-11-23 ENCOUNTER — Other Ambulatory Visit: Payer: Self-pay

## 2021-11-23 ENCOUNTER — Ambulatory Visit
Admission: RE | Admit: 2021-11-23 | Discharge: 2021-11-23 | Disposition: A | Discharge status: Home / Self Care | Payer: Medicare Other | Source: Ambulatory Visit | Attending: Radiation Oncology | Admitting: Radiation Oncology

## 2021-11-23 DIAGNOSIS — C090 Malignant neoplasm of tonsillar fossa: Secondary | ICD-10-CM | POA: Diagnosis not present

## 2021-11-26 ENCOUNTER — Ambulatory Visit
Admission: RE | Admit: 2021-11-26 | Discharge: 2021-11-26 | Disposition: A | Discharge status: Home / Self Care | Payer: Medicare Other | Source: Ambulatory Visit | Attending: Radiation Oncology | Admitting: Radiation Oncology

## 2021-11-26 ENCOUNTER — Other Ambulatory Visit: Payer: Self-pay

## 2021-11-26 ENCOUNTER — Other Ambulatory Visit: Payer: Self-pay | Admitting: Radiation Oncology

## 2021-11-26 DIAGNOSIS — C090 Malignant neoplasm of tonsillar fossa: Secondary | ICD-10-CM

## 2021-11-26 DIAGNOSIS — E871 Hypo-osmolality and hyponatremia: Secondary | ICD-10-CM

## 2021-11-26 DIAGNOSIS — C109 Malignant neoplasm of oropharynx, unspecified: Secondary | ICD-10-CM

## 2021-11-26 LAB — BASIC METABOLIC PANEL - CANCER CENTER ONLY
Anion gap: 7 (ref 5–15)
BUN: 77 mg/dL — ABNORMAL HIGH (ref 6–20)
CO2: 31 mmol/L (ref 22–32)
Calcium: 8.5 mg/dL — ABNORMAL LOW (ref 8.9–10.3)
Chloride: 84 mmol/L — ABNORMAL LOW (ref 98–111)
Creatinine: 1.95 mg/dL — ABNORMAL HIGH (ref 0.61–1.24)
GFR, Estimated: 40 mL/min — ABNORMAL LOW (ref >60)
Glucose, Bld: 326 mg/dL — ABNORMAL HIGH (ref 70–99)
Potassium: 4.9 mmol/L (ref 3.5–5.1)
Sodium: 122 mmol/L — ABNORMAL LOW (ref 135–145)

## 2021-11-26 MED ORDER — LIDOCAINE VISCOUS HCL 2 % MT SOLN: OROMUCOSAL | 3 refills | Status: DC

## 2021-11-26 MED ORDER — OMEPRAZOLE 40 MG PO CPDR: 40.0000 mg | DELAYED_RELEASE_CAPSULE | Freq: Every day | ORAL | 0 refills | Status: DC

## 2021-11-27 ENCOUNTER — Telehealth: Payer: Self-pay | Admitting: Internal Medicine

## 2021-11-27 ENCOUNTER — Inpatient Hospital Stay: Payer: Medicare Other | Admitting: Nutrition

## 2021-11-27 ENCOUNTER — Ambulatory Visit
Admission: RE | Admit: 2021-11-27 | Discharge: 2021-11-27 | Disposition: A | Discharge status: Home / Self Care | Payer: Medicare Other | Source: Ambulatory Visit | Attending: Radiation Oncology | Admitting: Radiation Oncology

## 2021-11-27 ENCOUNTER — Other Ambulatory Visit: Payer: Self-pay

## 2021-11-27 DIAGNOSIS — C090 Malignant neoplasm of tonsillar fossa: Secondary | ICD-10-CM | POA: Diagnosis not present

## 2021-11-27 LAB — RAD ONC ARIA SESSION SUMMARY
Course Elapsed Days: 8
Plan Fractions Treated to Date: 7
Plan Prescribed Dose Per Fraction: 2 Gy
Plan Total Fractions Prescribed: 35
Plan Total Prescribed Dose: 70 Gy
Reference Point Dosage Given to Date: 14 Gy
Reference Point Session Dosage Given: 2 Gy
Session Number: 7

## 2021-11-27 NOTE — Progress Notes (Signed)
Internal Medicine Clinic Attending  Case discussed with Dr. Bonanno  at the time of the visit.  We reviewed the resident's history and exam and pertinent patient test results.  I agree with the assessment, diagnosis, and plan of care documented in the resident's note.  

## 2021-11-27 NOTE — Telephone Encounter (Signed)
Called patient to review labs. I was unable to reach him, but left message I would call this afternoon.  ? ?Review of labs show hyperglycemia. Hyponatremia improving when corrected for hyperglycemia. Cr 1.95, and patient recent Cr has been between 1.45-1.9.  I will discuss further with patient and he has follow up schedule with me. I will see if patient has arranged follow up with nephrology.  ?

## 2021-11-27 NOTE — Progress Notes (Signed)
Nutrition follow-up completed with patient who receives radiation therapy for oropharyngeal cancer. ? ?Patient has not been weighed. ? ?Noted labs: Sodium 122,(corrected for hyperglycemia equals sodium 127/Hiller,1999), potassium 4.9, glucose 326, BUN 77, creatinine 1.95. ? ?Patient reports his appetite is stable.  States he no longer uses pudding or yogurt by mouth on a regular basis.  He does this "only if he needed".Marland Kitchen ?He is infusing approximately 4 cartons of Osmolite 1.5 via feeding tube daily.  Reports he does not want to do 4 feedings daily.  He flushes feeding tube with 60 mL of free water before and after bolus feedings.  He is infusing using gravity.   ?States he has not used salt water rinses consistently.   ?Reports he only drinks diet Dr. Malachi Bonds and Sunkist because these are "highest in sodium".   ?Denies drinking any water throughout the day. ?Reports ginger ale "gargle" has appeared to help his thick saliva. ? ?Reports he took [facility] for 3 days as prescribed by MD. ? ?Estimated energy needs.  2100-2300 cal, 105-120 g protein, approximately 2 L fluid. ? ?6 cartons Osmolite 1.5 would provide 2133 cal, 89 g protein, 1083 mL free water. ? ?Nutrition diagnosis: Moderate malnutrition continues. ? ?Intervention: ?Increase Osmolite 1.5-2 cartons 3 times daily with 60 mL of water before and after each bolus feeding. ?Educated to mix 1 tablespoon of salt with 2 cups of warm water.  Once dissolved and room temperature, patient can use this mixture to flush feeding tube.  Patient verbalizes understanding. ?Continue to enforce strategies to thin down secretions. ?Monitor blood glucose levels and adjust insulin per MD. ? ?Monitoring, evaluation, goals: Continue to monitor tolerance of tube feedings, weight trends and labs. ? ?Next visit: Tuesday, April 25. ? ?**Disclaimer: This note was dictated with voice recognition software. Similar sounding words can inadvertently be transcribed and this note may contain  transcription errors which may not have been corrected upon publication of note.** ? ?

## 2021-11-28 ENCOUNTER — Ambulatory Visit
Admission: RE | Admit: 2021-11-28 | Discharge: 2021-11-28 | Disposition: A | Discharge status: Home / Self Care | Payer: Medicare Other | Source: Ambulatory Visit | Attending: Radiation Oncology | Admitting: Radiation Oncology

## 2021-11-28 ENCOUNTER — Other Ambulatory Visit: Payer: Self-pay

## 2021-11-28 ENCOUNTER — Ambulatory Visit: Payer: Medicare Other | Attending: Radiation Oncology | Admitting: Physical Therapy

## 2021-11-28 ENCOUNTER — Telehealth: Payer: Self-pay | Admitting: Internal Medicine

## 2021-11-28 ENCOUNTER — Encounter: Payer: Self-pay | Admitting: Physical Therapy

## 2021-11-28 DIAGNOSIS — C090 Malignant neoplasm of tonsillar fossa: Secondary | ICD-10-CM | POA: Diagnosis not present

## 2021-11-28 DIAGNOSIS — C109 Malignant neoplasm of oropharynx, unspecified: Secondary | ICD-10-CM | POA: Insufficient documentation

## 2021-11-28 DIAGNOSIS — R293 Abnormal posture: Secondary | ICD-10-CM | POA: Insufficient documentation

## 2021-11-28 DIAGNOSIS — I890 Lymphedema, not elsewhere classified: Secondary | ICD-10-CM | POA: Diagnosis not present

## 2021-11-28 DIAGNOSIS — R1310 Dysphagia, unspecified: Secondary | ICD-10-CM | POA: Insufficient documentation

## 2021-11-28 LAB — RAD ONC ARIA SESSION SUMMARY
Course Elapsed Days: 9
Plan Fractions Treated to Date: 8
Plan Prescribed Dose Per Fraction: 2 Gy
Plan Total Fractions Prescribed: 35
Plan Total Prescribed Dose: 70 Gy
Reference Point Dosage Given to Date: 16 Gy
Reference Point Session Dosage Given: 2 Gy
Session Number: 8

## 2021-11-28 NOTE — Therapy (Signed)
?OUTPATIENT PHYSICAL THERAPY HEAD AND NECK BASELINE EVALUATION ? ? ?Patient Name: Arthur Burke ?MRN: [id number] ?DOB:April 12, 1966, 56 y.o., male ?Today's Date: 11/28/2021 ? ? PT End of Session - 11/28/21 1133   ? ? Visit Number 1   ? Number of Visits 9   ? Date for PT Re-Evaluation 12/26/21   ? PT Start Time 1053   ? PT Stop Time 1130   ? PT Time Calculation (min) 37 min   ? Activity Tolerance Patient tolerated treatment well   ? Behavior During Therapy [facility] for tasks assessed/performed   ? ?  ?  ? ?  ? ? ?Past Medical History:  ?Diagnosis Date  ? Acute osteomyelitis of left calcaneus (HCC)   ? Diabetes mellitus without complication ([place])   ? Diabetic foot infection ([place])   ? Hypertension   ? Infected ulcer of skin, with fat layer exposed ([place])   ? Lower limb ulcer, ankle, left, with necrosis of muscle ([place])   ? MRSA (methicillin resistant staph aureus) culture positive   ? Renal disorder   ? Vision impairment   ? ?Past Surgical History:  ?Procedure Laterality Date  ? AMPUTATION Left 08/23/2017  ? Procedure: AMPUTATION BELOW KNEE;  Surgeon: Newt Minion, MD;  Location: [place];  Service: Orthopedics;  Laterality: Left;  ? APPLICATION OF WOUND VAC Left 08/23/2017  ? Procedure: APPLICATION OF WOUND VAC;  Surgeon: Newt Minion, MD;  Location: [place];  Service: Orthopedics;  Laterality: Left;  ? IR GASTROSTOMY TUBE MOD SED  10/22/2021  ? IR IMAGING GUIDED PORT INSERTION  10/22/2021  ? KIDNEY TRANSPLANT  2006  ? [facility]  ? SKIN GRAFT    ? L diabetic foot wound with debriedment and skin grafting  ? TOE AMPUTATION    ? R great and 2nd toe  ? ?Patient Active Problem List  ? Diagnosis Date Noted  ? 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 Nausea 11/07/2021  ? Malnutrition of moderate degree 10/26/2021  ? Type 1 diabetes mellitus with hyperglycemia (HCC)   ? Dysphagia   ? Dehydration with hyponatremia 10/21/2021  ? Oropharyngeal cancer ([place]) 10/21/2021  ? Fall   ? Essential hypertension   ? Unilateral complete BKA, left, sequela  ([place])   ? Leukocytosis   ? AKI (acute kidney injury) ([place])   ? Diabetic peripheral neuropathy associated with type 2 diabetes mellitus ([place])   ? Amputation of left lower extremity below knee ([place]) 08/26/2017  ? Anemia in chronic kidney disease   ? Unilateral complete BKA, left, subsequent encounter ([place])   ? Post-operative pain   ? Stage 3 chronic kidney disease ([place])   ? Hyponatremia   ? Benign essential HTN   ? Acute blood loss anemia   ? S/P BKA (below knee amputation) unilateral, left ([place]) 08/23/2017  ? Streptococcal bacteremia 08/23/2017  ? Normocytic anemia   ? History of kidney transplant   ? 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 ([place])   ? ? ?PCP: Simona Huh, NP ? ?REFERRING PROVIDER: Eppie Gibson, MD ? ?REFERRING DIAG: C10.9 (ICD-10-CM) - Oropharyngeal cancer ([place]) ? ?THERAPY DIAG:  ?Lymphedema, not elsewhere classified ? ?Abnormal posture ? ?Oropharyngeal carcinoma ([place]) ? ?ONSET DATE: 10/21/21 ? ?SUBJECTIVE    ?SUBJECTIVE STATEMENT: ?Patient reports they are here today to be seen by their medical team for her newly diagnosed oropharyngeal cancer. Pt reports his swelling began after the 4th  treatment.  ? ?PERTINENT HISTORY:  ?Squamous Cell carcinoma of the Oropharynx, stage III (T2, N3, M0, p 16 +). He presented to the ED on 10/21/21 with a chronic enlarging neck mass and swallowing difficulties. 10/21/21 CT neck a bulky mass at the left glossotonsillar sulcus measuring 3.2 cm, cervical adenopathy, and multiple and bulky bilateral nodal metastases. Possible extracapsular tumor extension was also noted. 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 11/09/21 PET revealed no evidence of disease beyond the head and neck region.  It confirms the bilateral adenopathy in his neck as well as the primary extending from the tonsillar fossa through the  left base of tongue. Consult with Dr. Chryl Heck and Dr. Isidore Moos on 11/09/21. He will not receive chemotherapy due to comorbidities. CT simulation completed 11/09/21 as well. Will receive 35 fractions of radiation to his Oropharynx and bilateral neck.  He started on 11/19/21 and will complete on 01/04/22. PAC & PEG 10/22/21 as an inpatient. History of Type 1 diabetes. S/P Left BKA. He usually uses a prothesis, but it works better without it while receiving radiation. Hx Kidney transplant at [facility], 2006 ? ?PATIENT GOALS   to be educated about the signs and symptoms of lymphedema and learn post op HEP.  ? ?PAIN:  ?Are you having pain? Yes: NPRS scale: 5/10 ?Pain location: all over- lower body, abdomen and back due to nerve damage from diabetes ?Pain description: stabbing, burning ?Aggravating factors: nothing ?Relieving factors: pain meds ? ? ?PRECAUTIONS: Active CA and Comment (hx of kidney transplant, L BKA) ? ?WEIGHT BEARING RESTRICTIONS No ? ?FALLS:  ?Has patient fallen in last 6 months? No ? ?LIVING ENVIRONMENT: ?Patient lives with: alone ?Lives in: House/apartment ?Has following equipment at home: Gilford Rile - 2 wheeled, Wheelchair (manual), shower chair, bed side commode, and Grab bars ? ?OCCUPATION: on disability ? ?LEISURE: pt does not exercise ? ?PRIOR LEVEL OF FUNCTION: Independent with household mobility with device ? ? ?OBJECTIVE ? ?COGNITION: ?          Overall cognitive status: Within functional limits for tasks assessed                 ? ?POSTURE:  ?Forward head and rounded shoulders posture ? ? 30 SEC SIT TO STAND: ?Not tested as pt did not have his prosthetic ? ? ?SHOULDER AROM:   WFL ? ? ? ?CERVICAL AROM: ? ? Percent limited  ?Flexion WFL  ?Extension WFL  ?Right lateral flexion WFL  ?Left lateral flexion WFL  ?Right rotation WFL  ?Left rotation WFL  ?  (Blank rows=not tested) ? ? ?LYMPHEDEMA ASSESSMENT:  ? ? Circumference in cm  ?4 cm superior to sternal notch around neck 42  ?6 cm superior to sternal notch around  neck 44.2  ?8 cm superior to sternal notch around neck 46  ?10 cm superior to sternal notch around neck 48  ?(Blank rows=not tested) ? ?GAIT: ? Assessed: No pt did not have his prosthetic leg ? ?PATIENT EDUCATION:  ?Education details: Neck ROM, importance of posture when sitting, standing and lying down, deep breathing, walking program and importance of staying active throughout treatment, CURE article on staying active, "Why exercise?" flyer, lymphedema and PT info, anatomy and physiology of the lymphatic system, how to treat lymphedema ?Person educated: Patient, Brother ?Education method: Explanation, Demonstration, Handout ?Education comprehension: Patient verbalized understanding and returned demonstration ? ? ?HOME EXERCISE PROGRAM: ?Patient was instructed today in a home exercise program today  for head and neck range of motion exercises. These included active cervical flexion, active cervical extension, active cervical rotation to each direction, upper trap stretch, and shoulder retraction. Patient was encouraged to do these 2-3 times a day, holding for 5 sec each and completing for 5 reps. Pt was educated that once this becomes easier then hold the stretches for 30-60 seconds.  ?  ? TREATMENT TODAY: ? Briefly educated pt in very basic self MLD technique to anterior neck - will instruct in full sequence at next visit ? Created foam chip pack for pt to wear around head and neck for compression educating pt not to tie it too tightly and to wear it as much as possible ? ?ASSESSMENT: ? ?CLINICAL IMPRESSION: ?Pt arrives to PT with recently diagnosed oropharyngeal cancer. Pt will undergo radiation. Pt started treatment on 11/19/21 and complete on 01/04/22.  Pt's cervical ROM was [facility]. Educated pt about signs and symptoms of lymphedema as well as anatomy and physiology of lymphatic system. Issued a chip pack for pt to begin wearing around his neck since he has developed anterior neck lymphedema in his first few  radiation sessions. Also educated pt in very basic MLD technique to begin at home. Will instruct pt in entire sequence at next session. Educated pt in importance of staying as active as possible throughout tre

## 2021-11-28 NOTE — Telephone Encounter (Signed)
Unable to reach Arthur Burke on third attempt to review lab work. He has appointment in 5 days with me. I will review labs then and address T1DM with hyperglycemia.  No further workup at this time.  ?

## 2021-11-29 ENCOUNTER — Ambulatory Visit
Admission: RE | Admit: 2021-11-29 | Discharge: 2021-11-29 | Disposition: A | Discharge status: Home / Self Care | Payer: Medicare Other | Source: Ambulatory Visit | Attending: Radiation Oncology | Admitting: Radiation Oncology

## 2021-11-29 ENCOUNTER — Other Ambulatory Visit: Payer: Self-pay

## 2021-11-29 ENCOUNTER — Ambulatory Visit: Payer: Medicare Other

## 2021-11-29 DIAGNOSIS — C109 Malignant neoplasm of oropharynx, unspecified: Secondary | ICD-10-CM | POA: Diagnosis not present

## 2021-11-29 DIAGNOSIS — C090 Malignant neoplasm of tonsillar fossa: Secondary | ICD-10-CM | POA: Diagnosis not present

## 2021-11-29 DIAGNOSIS — R1310 Dysphagia, unspecified: Secondary | ICD-10-CM

## 2021-11-29 LAB — RAD ONC ARIA SESSION SUMMARY
Course Elapsed Days: 10
Plan Fractions Treated to Date: 9
Plan Prescribed Dose Per Fraction: 2 Gy
Plan Total Fractions Prescribed: 35
Plan Total Prescribed Dose: 70 Gy
Reference Point Dosage Given to Date: 18 Gy
Reference Point Session Dosage Given: 2 Gy
Session Number: 9

## 2021-11-29 NOTE — Therapy (Signed)
?OUTPATIENT SPEECH LANGUAGE PATHOLOGY ONCOLOGY EVALUATION ? ? ?Patient Name: Arthur Burke ?MRN: [id number] ?DOB:[birth year], 56 y.o., male ?Today's Date: 11/29/2021 ? ?PCP: Simona Huh, NP ?REFERRING PROVIDER: Eppie Gibson, MD ? ? End of Session - 11/29/21 1303   ? ? Visit Number 1   ? Number of Visits 7   ? Date for SLP Re-Evaluation 02/27/22   ? SLP Start Time 1040   ? SLP Stop Time  1115   ? SLP Time Calculation (min) 35 min   ? Activity Tolerance Patient tolerated treatment well   ? ?  ?  ? ?  ? ? ?Past Medical History:  ?Diagnosis Date  ? Acute osteomyelitis of left calcaneus (HCC)   ? Diabetes mellitus without complication ([place])   ? Diabetic foot infection ([place])   ? Hypertension   ? Infected ulcer of skin, with fat layer exposed ([place])   ? Lower limb ulcer, ankle, left, with necrosis of muscle ([place])   ? MRSA (methicillin resistant staph aureus) culture positive   ? Renal disorder   ? Vision impairment   ? ?Past Surgical History:  ?Procedure Laterality Date  ? AMPUTATION Left 08/23/2017  ? Procedure: AMPUTATION BELOW KNEE;  Surgeon: Newt Minion, MD;  Location: [facility];  Service: Orthopedics;  Laterality: Left;  ? APPLICATION OF WOUND VAC Left 08/23/2017  ? Procedure: APPLICATION OF WOUND VAC;  Surgeon: Newt Minion, MD;  Location: [facility];  Service: Orthopedics;  Laterality: Left;  ? IR GASTROSTOMY TUBE MOD SED  10/22/2021  ? IR IMAGING GUIDED PORT INSERTION  10/22/2021  ? KIDNEY TRANSPLANT  2006  ? [facility]  ? SKIN GRAFT    ? L diabetic foot wound with debriedment and skin grafting  ? TOE AMPUTATION    ? R great and 2nd toe  ? ?Patient Active Problem List  ? Diagnosis Date Noted  ? 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 Nausea 11/07/2021  ? Malnutrition of moderate degree 10/26/2021  ? Type 1 diabetes mellitus with hyperglycemia (HCC)   ? Dysphagia   ? Dehydration with hyponatremia 10/21/2021  ? 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 Fall   ? Essential hypertension   ? Unilateral complete BKA, left, sequela  ([place])   ? Leukocytosis   ? AKI (acute kidney injury) ([place])   ? Diabetic peripheral neuropathy associated with type 2 diabetes mellitus ([place])   ? Amputation of left lower extremity below knee ([place]) 08/26/2017  ? Anemia in chronic kidney disease   ? Unilateral complete BKA, left, subsequent encounter ([place])   ? Post-operative pain   ? Stage 3 chronic kidney disease ([place])   ? Hyponatremia   ? Benign essential HTN   ? Acute blood loss anemia   ? S/P BKA (below knee amputation) unilateral, left ([place]) 08/23/2017  ? Streptococcal bacteremia 08/23/2017  ? Normocytic anemia   ? History of kidney transplant   ? 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 ([place])   ? ? ?ONSET DATE: 10-21-21  ? ?REFERRING DIAG: SCCA of oropharynx ? ?THERAPY DIAG:  ?Dysphagia, unspecified type ? ?SUBJECTIVE:  ? ?SUBJECTIVE STATEMENT: ?Pt reports he is PEG dependent due to difficulty with pharyngeal clearance. ?Pt accompanied by: friend ? ?PERTINENT HISTORY: He presented to the ED on 10/21/21 with a chronic enlarging neck mass and swallowing difficulties. 10/21/21 CT neck a bulky mass at the left glossotonsillar sulcus measuring 3.2 cm, cervical adenopathy,  and multiple and bulky bilateral nodal metastases. Possible extracapsular tumor extension was also noted. 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 11/09/21 PET revealed no evidence of disease beyond the head and neck region.  It confirms the bilateral adenopathy in his neck as well as the primary extending from the tonsillar fossa through the left base of tongue. Consult with Dr. Chryl Heck and Dr. Isidore Moos on 11/09/21. He will not receive chemotherapy due to comorbidities. CT simulation completed 11/09/21 as well. Will receive 35 fractions of radiation to his Oropharynx and bilateral neck.  He started rad tx on 11/19/21 and will complete on  01/04/22. PAC & PEG 10/22/21 as an inpatient. History of Type 1 diabetes. S/P Left BKA. He usually uses a prothesis, but it works better without it while receiving radiation. Hx Kidney transplant at [facility], 2006  ? ?PAIN:  ?Are you having pain? No ? ?FALLS: Has patient fallen in last 6 months?  No ? ?LIVING ENVIRONMENT: ?Lives with: lives alone ?Lives in: House/apartment ? ?PLOF:  ?Level of assistance: Independent with ADLs ?Employment: On disability ? ? ?PATIENT GOALS Maintain WNL swallow function. ? ?OBJECTIVE:  ? ?DIAGNOSTIC FINDINGS: Pt seen for on 10-26-21 MBS "for instrumental assessment of baseline swallow function in preparation of radiation to treat oropharyngeal cancer. Pt demonstrates visible tissue abnormality around the valleculae and epiglottis. There is trace residue with liquids and solids in the vallecular space after the swallow. Otherwise the hyolaryngeal mechanism is strong and timely. Pt clears any solids well with a liquid wash. Pill transits with thin liquids without difficulty. Esophageal sweep appears WNL. Pt currently able to consume regular solids and thin liquids. SLP repeated some expectations about swallowing during radiation treatment, emphasizing the potential for increased dysphagia and the importance of following up with SLP for HEP and to address challenges with swallowing and the continued use of the swallowing mechanism during radiation to prevent fibrosis." ? ? ?COGNITION: ?Overall cognitive status: Within functional limits for tasks assessed ? ? ? LANGUAGE: ?Receptive and Expressive language appeared WNL. ? ?ORAL MOTOR ASSESSMENT:   ?Impaired: Lingual: Comment: pt with decr'd ROM to posterior lateral sulci - superior and inferior. ? ?MOTOR SPEECH: ?Overall motor speech: Appears intact ? ?CLINICAL SWALLOW ASSESSMENT:   ?Current diet: thin liquids - pt also has pudding to assist with some medication at this time but is PEG dependent for the last 10 days, for his nutrition. ?Dentition:  adequate natural dentition ?Patient directly observed with POs: Yes: thin liquids  ?Feeding: able to feed self ?Liquids provided by:  bottle ?Oral phase signs and symptoms:  WNL ?Pharyngeal phase signs and symptoms:  throat clear 1/6 sips. He reports pill dysphagia with large pills the last 4 days. Currently is crushing and mixing with liquid and swallowing, or administering through PEG. ? ? ?TODAY'S TREATMENT:  ?Research states the risk for dysphagia increases due to radiation and/or chemotherapy treatment due to a variety of factors, so SLP educated the pt about the possibility of reduced/limited ability for PO intake during rad tx. SLP also educated pt regarding possible changes to swallowing musculature after rad tx, and why adherence to dysphagia HEP provided today and PO consumption was necessary to inhibit muscle fibrosis following rad tx and to mitigate muscle disuse atrophy. SLP informed pt why this would be detrimental to their swallowing status and to their pulmonary health. Pt demonstrated understanding of these things to SLP. SLP encouraged pt to safely eat and drink  as deep into their radiation/chemotherapy as possible to provide the best possible long-term swallowing outcome for pt.  ?  ?SLP then developed an individualized HEP for pt involving oral and pharyngeal strengthening and ROM and pt was instructed how to perform these exercises, including SLP demonstration. After SLP demonstration, pt return demonstrated each exercise. SLP ensured pt performance was correct prior to educating pt on next exercise. Pt required rare min cues faded to modified independent to perform HEP. Pt was instructed to complete this program 6-7 days/week, at least 2 times a day until 6 months after his or her last day of rad tx, and then x2 a week after that, indefinitely. Among other modifications for days when pt cannot functionally swallow, SLP also suggested pt to cycle through the swallowing portion so the full  program of exercises can be completed instead of fatiguing on one of the swallowing exercises and being unable to perform the other swallowing exercises. SLP instructed that swallowing exercises should then be

## 2021-11-29 NOTE — Patient Instructions (Signed)
SWALLOWING EXERCISES ?Do these until 6 months after your last day of radiation, then 2 times per week afterwards ? ?Effortful Swallows ?- Press your tongue against the roof of your mouth for 3 seconds, then squeeze the muscles in your neck while you swallow your saliva or a sip of water ?- Repeat 10-15 times, 2-3 times a day, and use whenever you eat or drink ? ?[place]ako Swallow - swallow with your tongue sticking out ?- Stick tongue out past your lips and gently bite tongue with your teeth ?- Swallow, while holding your tongue with your teeth ?- Repeat 10-15 times, 2-3 times a day ?*use a wet spoon if your mouth gets dry* ? ?Shaker Exercise - head lift ?- Lie flat on your back in your bed or on a couch without pillows ?- Raise your head and look at your feet - KEEP YOUR SHOULDERS DOWN ?- HOLD FOR 45-60 SECONDS, then lower your head back down ?- Repeat 3 times, 2-3 times a day ? ?Mendelsohn Maneuver - ?half swallow? exercise ?- Start to swallow, and keep your Adam?s apple up by squeezing hard with the muscles of the throat ?- Hold the squeeze for 5-7 seconds and then relax ?- Repeat 10-15 times, 2-3 times a day ?*use a wet spoon if your mouth gets dry* ? ?      5.   "Super Swallow" ? - Take a breath and hold it ? - Swallow then IMMEDIATELY cough ? - Repeat 10 times, 2-3 times a day ? ?

## 2021-11-30 ENCOUNTER — Other Ambulatory Visit: Payer: Self-pay

## 2021-11-30 ENCOUNTER — Ambulatory Visit
Admission: RE | Admit: 2021-11-30 | Discharge: 2021-11-30 | Disposition: A | Discharge status: Home / Self Care | Payer: Medicare Other | Source: Ambulatory Visit | Attending: Radiation Oncology | Admitting: Radiation Oncology

## 2021-11-30 DIAGNOSIS — C090 Malignant neoplasm of tonsillar fossa: Secondary | ICD-10-CM | POA: Diagnosis not present

## 2021-11-30 LAB — RAD ONC ARIA SESSION SUMMARY
Course Elapsed Days: 11
Plan Fractions Treated to Date: 10
Plan Prescribed Dose Per Fraction: 2 Gy
Plan Total Fractions Prescribed: 35
Plan Total Prescribed Dose: 70 Gy
Reference Point Dosage Given to Date: 20 Gy
Reference Point Session Dosage Given: 2 Gy
Session Number: 10

## 2021-12-03 ENCOUNTER — Other Ambulatory Visit: Payer: Self-pay

## 2021-12-03 ENCOUNTER — Ambulatory Visit (INDEPENDENT_AMBULATORY_CARE_PROVIDER_SITE_OTHER): Payer: Medicare Other | Admitting: Internal Medicine

## 2021-12-03 ENCOUNTER — Other Ambulatory Visit (HOSPITAL_COMMUNITY): Payer: Self-pay

## 2021-12-03 ENCOUNTER — Ambulatory Visit
Admission: RE | Admit: 2021-12-03 | Discharge: 2021-12-03 | Disposition: A | Discharge status: Home / Self Care | Payer: Medicare Other | Source: Ambulatory Visit | Attending: Radiation Oncology | Admitting: Radiation Oncology

## 2021-12-03 DIAGNOSIS — E871 Hypo-osmolality and hyponatremia: Secondary | ICD-10-CM

## 2021-12-03 DIAGNOSIS — Z940 Kidney transplant status: Secondary | ICD-10-CM | POA: Diagnosis not present

## 2021-12-03 DIAGNOSIS — C090 Malignant neoplasm of tonsillar fossa: Secondary | ICD-10-CM

## 2021-12-03 DIAGNOSIS — I70209 Unspecified atherosclerosis of native arteries of extremities, unspecified extremity: Secondary | ICD-10-CM

## 2021-12-03 DIAGNOSIS — E1051 Type 1 diabetes mellitus with diabetic peripheral angiopathy without gangrene: Secondary | ICD-10-CM | POA: Diagnosis present

## 2021-12-03 LAB — RAD ONC ARIA SESSION SUMMARY
Course Elapsed Days: 14
Plan Fractions Treated to Date: 11
Plan Prescribed Dose Per Fraction: 2 Gy
Plan Total Fractions Prescribed: 35
Plan Total Prescribed Dose: 70 Gy
Reference Point Dosage Given to Date: 22 Gy
Reference Point Session Dosage Given: 2 Gy
Session Number: 11

## 2021-12-03 MED ORDER — SONAFINE EX EMUL
1.0000 "application " | Freq: Once | CUTANEOUS | Status: AC
  Administered 2021-12-03: 1 via TOPICAL

## 2021-12-03 MED ORDER — MYCOPHENOLATE MOFETIL 200 MG/ML PO SUSR
500.0000 mg | Freq: Two times a day (BID) | ORAL | 1 refills | Status: DC
  Filled 2021-12-03: qty 160, 30d supply, fill #0
  Filled 2021-12-03: qty 160, 32d supply, fill #0

## 2021-12-03 NOTE — Patient Instructions (Signed)
Thank you for trusting me with your care. To recap, today we discussed the following: ? ? ?History of kidney transplant ?- mycophenolate (CELLCEPT) 200 MG/ML suspension; Take 2.5 mLs (500 mg total) by mouth 2 (two) times daily.  Dispense: 160 mL; Refill: 1 ?- plans to follow up with nephrology at [facility] ? ?Type 1 DM ?- Waiting on continuous glucose monitor to make adjustments to regimen.  ? ?Hyponatremia ?- Check BMP today ?- Continue current treatment of increased salt intake  ?

## 2021-12-04 ENCOUNTER — Inpatient Hospital Stay: Payer: Medicare Other | Admitting: Nutrition

## 2021-12-04 ENCOUNTER — Ambulatory Visit
Admission: RE | Admit: 2021-12-04 | Discharge: 2021-12-04 | Disposition: A | Discharge status: Home / Self Care | Payer: Medicare Other | Source: Ambulatory Visit | Attending: Radiation Oncology | Admitting: Radiation Oncology

## 2021-12-04 ENCOUNTER — Other Ambulatory Visit: Payer: Self-pay

## 2021-12-04 ENCOUNTER — Telehealth: Payer: Self-pay | Admitting: Internal Medicine

## 2021-12-04 ENCOUNTER — Other Ambulatory Visit (HOSPITAL_COMMUNITY): Payer: Self-pay

## 2021-12-04 DIAGNOSIS — C090 Malignant neoplasm of tonsillar fossa: Secondary | ICD-10-CM | POA: Diagnosis not present

## 2021-12-04 DIAGNOSIS — R110 Nausea: Secondary | ICD-10-CM

## 2021-12-04 LAB — RAD ONC ARIA SESSION SUMMARY
Course Elapsed Days: 15
Plan Fractions Treated to Date: 12
Plan Prescribed Dose Per Fraction: 2 Gy
Plan Total Fractions Prescribed: 35
Plan Total Prescribed Dose: 70 Gy
Reference Point Dosage Given to Date: 24 Gy
Reference Point Session Dosage Given: 2 Gy
Session Number: 12

## 2021-12-04 LAB — BMP8+ANION GAP
Anion Gap: 14 mmol/L (ref 10.0–18.0)
BUN/Creatinine Ratio: 34 — ABNORMAL HIGH (ref 9–20)
BUN: 76 mg/dL (ref 6–24)
CO2: 27 mmol/L (ref 20–29)
Calcium: 9.2 mg/dL (ref 8.7–10.2)
Chloride: 87 mmol/L — ABNORMAL LOW (ref 96–106)
Creatinine, Ser: 2.26 mg/dL — ABNORMAL HIGH (ref 0.76–1.27)
Glucose: 241 mg/dL — ABNORMAL HIGH (ref 70–99)
Potassium: 5.6 mmol/L — ABNORMAL HIGH (ref 3.5–5.2)
Sodium: 128 mmol/L — ABNORMAL LOW (ref 134–144)
eGFR: 34 mL/min/{1.73_m2} — ABNORMAL LOW (ref >59)

## 2021-12-04 MED ORDER — LOKELMA 10 G PO PACK: 10.0000 g | PACK | Freq: Every day | ORAL | 0 refills | Status: DC

## 2021-12-04 MED ORDER — ONDANSETRON HCL 4 MG PO TABS: 4.0000 mg | ORAL_TABLET | ORAL | 0 refills | Status: DC | PRN

## 2021-12-04 NOTE — Progress Notes (Signed)
Nutrition follow up completed after radiation treatment for oropharyngeal cancer. ? ?Noted labs on April 24: Glucose 241, BUN 76, Creatinine 2.26, Na 128, and potassium 5.6. ? ?Patient reports some nausea improved with Zofran. ?Complains of indigestion. ?He is not eating or drinking by mouth. ?Reports he infuses ~ 3 1/2 cartons of Osmolite 1.5 daily. Goal is 6 cartons daily. ?He doesn't use as much Osmolite because he is trying to control his blood sugar. ? ?Estimated energy needs.  2100-2300 cal, 105-120 g protein, approximately 2 L fluid. ?  ?6 cartons Osmolite 1.5 would provide 2133 cal, 89 g protein, 1083 mL free water. ? ?Nutrition diagnosis: Moderate malnutrition continues. ? ?Intervention: ?Increase Osmolite 1.5-2 cartons 3 times daily with 60 mL of water before and after each bolus feeding. ?Continue 1 tablespoon of salt with 2 cups of warm water for free water flushes. ?Discuss adding additional medication for blood glucose control so patient can increase tube feeding to goal. ? ?Monitoring, Evaluation, Goals: ?Continue to monitor tolerance of tube feedings, weight trends and labs. ? ?Next Visit: Tuesday, May 2 after radiation. ? ? ?

## 2021-12-04 NOTE — Addendum Note (Signed)
Addended by: Lyndal Pulley on: 12/04/2021 06:45 PM ? ? Modules accepted: Orders ? ?

## 2021-12-04 NOTE — Progress Notes (Signed)
? ?  CC: Type 1 DM, Hyponatremia, History of kidney transplant ? ?HPI:[name] is a 56 y.o. male who presents for evaluation of Type 1 DM, hyponatremia, and history of kidney transplant. Please see individual problem based A/P for details. ? ?Past Medical History:  ?Diagnosis Date  ? Acute osteomyelitis of left calcaneus (HCC)   ? Diabetes mellitus without complication ([place])   ? Diabetic foot infection ([place])   ? Hypertension   ? Infected ulcer of skin, with fat layer exposed ([place])   ? Lower limb ulcer, ankle, left, with necrosis of muscle ([place])   ? MRSA (methicillin resistant staph aureus) culture positive   ? Renal disorder   ? Vision impairment   ? ?Review of Systems:   ?Review of Systems  ?Constitutional:  Negative for chills and fever.  ?Cardiovascular:  Negative for leg swelling.   ? ?Physical Exam: ?Vitals:  ? 12/03/21 1349  ?BP: (!) 145/57  ?Pulse: 63  ?Temp: 98.2 ?F (36.8 ?C)  ?TempSrc: Oral  ?SpO2: 97%  ?Weight: 158 lb 9.6 oz (71.9 kg)  ?Height: '5\' 11"'$  (1.803 m)  ? ? ? ?Physical Exam ?Constitutional:   ?   Comments: Sitting in wheelchair, chronically ill appearing  ?Cardiovascular:  ?   Rate and Rhythm: Normal rate and regular rhythm.  ?   Heart sounds: Murmur heard.  ?Pulmonary:  ?   Effort: Pulmonary effort is normal.  ?   Breath sounds: Normal breath sounds.  ?Musculoskeletal:  ?   Right lower leg: No edema.  ?   Comments: L.BKA  ?Skin: ?   General: Skin is warm and dry.  ? ? ? ?Assessment & Plan:  ? 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 Most recent Hgb A1c 10/21/21 8.2. He does not have glucometer with him today. He is waiting CGM device being sent to him. Blood glucose elevated on most labs check. Patient is on tube feeds.  ? ?Plan: ?- Continue Lantus 22 units daily ?- Humalog 6 units TID with tube feeds.  ?- SSI ?- Discussed monitoring blood glucose 4 times daily and/or when he receives his CGM we could use this data  to make further adjustments. ?- Follow up in one month.  ? ?Hyponatremia ?SIADH. Hyponatremia improving with current treatment . Na corrected 131 on todays BMP. Patient asymptomatic. ? ?Plan ?Continue table spoon of salt with free water flushes.  ? ?History of kidney transplant ?Request refill of Mycophenelate. Patient has not missed any doses. Patient missed 6 month appointment with his nephrologist at [facility].  Baseline Cr 2-2.2. Recommended scheduling with his nephrologist.  ? ?- BMP today ?- mycophenolate (CELLCEPT) 200 MG/ML suspension; Take 2.5 mLs (500 mg total) by mouth 2 (two) times daily.  Dispense: 160 mL; Refill: 1 ?- BMP8+Anion Gap ? ? ? ? ?Patient discussed with Dr. Saverio Danker ? ?

## 2021-12-04 NOTE — Telephone Encounter (Signed)
Baseline Creatine estimated to be 2 to 2.2. His Cr was 2.2 Bun 54 at his last nephrology appointment at [facility]. BMP checked yesterday BUN 76 and Cr 2.26. He has been taking Lasix 40 mg twice a day . He was prescribed lasix for arm edema. He also reports taking for lower extremity edema. No history of heart failure. ? ?In addition , Hyperkalemia on labs. Will send Lokelma. Patient endorses he has not been taking Ramipril. Will check with RD to see if we can make adjustment to potassium patient is getting.  ? ?Having some nausea. Not sure if this is secondary to uremia or patient notes he is having lots of secretions related to oropharyngeal cancer. Taking Zofran and request refill. Will send.  ? ?I will request BMP and tacrolimus level to be drawn at cancer center on Friday. I recommended calling and scheduling follow up with nephrologist to patient.  ?

## 2021-12-05 ENCOUNTER — Other Ambulatory Visit: Payer: Self-pay

## 2021-12-05 ENCOUNTER — Encounter: Payer: Self-pay | Admitting: Internal Medicine

## 2021-12-05 ENCOUNTER — Telehealth: Payer: Self-pay | Admitting: *Deleted

## 2021-12-05 ENCOUNTER — Ambulatory Visit
Admission: RE | Admit: 2021-12-05 | Discharge: 2021-12-05 | Disposition: A | Discharge status: Home / Self Care | Payer: Medicare Other | Source: Ambulatory Visit | Attending: Radiation Oncology | Admitting: Radiation Oncology

## 2021-12-05 DIAGNOSIS — Z79621 Long term (current) use of calcineurin inhibitor: Secondary | ICD-10-CM

## 2021-12-05 DIAGNOSIS — C090 Malignant neoplasm of tonsillar fossa: Secondary | ICD-10-CM | POA: Diagnosis not present

## 2021-12-05 LAB — RAD ONC ARIA SESSION SUMMARY
Course Elapsed Days: 16
Plan Fractions Treated to Date: 13
Plan Prescribed Dose Per Fraction: 2 Gy
Plan Total Fractions Prescribed: 35
Plan Total Prescribed Dose: 70 Gy
Reference Point Dosage Given to Date: 26 Gy
Reference Point Session Dosage Given: 2 Gy
Session Number: 13

## 2021-12-05 NOTE — Progress Notes (Signed)
Internal Medicine Clinic Attending  Case discussed with Dr. Steen  At the time of the visit.  We reviewed the resident's history and exam and pertinent patient test results.  I agree with the assessment, diagnosis, and plan of care documented in the resident's note.  

## 2021-12-05 NOTE — Assessment & Plan Note (Signed)
Patient is here for management of Type 1 DM. Most recent Hgb A1c 10/21/21 8.2. He does not have glucometer with him today. He is waiting CGM device being sent to him. Blood glucose elevated on most labs check. Patient is on tube feeds.  ? ?Plan: ?- Continue Lantus 22 units daily ?- Humalog 6 units TID with tube feeds.  ?- SSI ?- Discussed monitoring blood glucose 4 times daily and/or when he receives his CGM we could use this data to make further adjustments. ?- Follow up in one month.  ?

## 2021-12-05 NOTE — Assessment & Plan Note (Signed)
Request refill of Mycophenelate. Patient has not missed any doses. Patient missed 6 month appointment with his nephrologist at [facility].  Baseline Cr 2-2.2. Recommended scheduling with his nephrologist.  ? ?- BMP today ?- mycophenolate (CELLCEPT) 200 MG/ML suspension; Take 2.5 mLs (500 mg total) by mouth 2 (two) times daily.  Dispense: 160 mL; Refill: 1 ?- BMP8+Anion Gap ? ? ?

## 2021-12-05 NOTE — Assessment & Plan Note (Signed)
SIADH. Hyponatremia improving with current treatment . Na corrected 131 on todays BMP. Patient asymptomatic. ? ?Plan ?Continue table spoon of salt with free water flushes.  ?

## 2021-12-05 NOTE — Telephone Encounter (Signed)
CALLED PATIENT TO ASK ABOUT GETTING LABS ON Friday April 28, PATIENT AGREED TO GO ON 12-07-21 @ 3:45 PM ?

## 2021-12-05 NOTE — Progress Notes (Signed)
Tacrolimus

## 2021-12-06 ENCOUNTER — Other Ambulatory Visit: Payer: Self-pay

## 2021-12-06 ENCOUNTER — Encounter: Payer: Self-pay | Admitting: Rehabilitation

## 2021-12-06 ENCOUNTER — Ambulatory Visit
Admission: RE | Admit: 2021-12-06 | Discharge: 2021-12-06 | Disposition: A | Discharge status: Home / Self Care | Payer: Medicare Other | Source: Ambulatory Visit | Attending: Radiation Oncology | Admitting: Radiation Oncology

## 2021-12-06 ENCOUNTER — Ambulatory Visit: Payer: Medicare Other | Admitting: Rehabilitation

## 2021-12-06 DIAGNOSIS — C090 Malignant neoplasm of tonsillar fossa: Secondary | ICD-10-CM | POA: Diagnosis not present

## 2021-12-06 DIAGNOSIS — C109 Malignant neoplasm of oropharynx, unspecified: Secondary | ICD-10-CM | POA: Diagnosis not present

## 2021-12-06 DIAGNOSIS — I890 Lymphedema, not elsewhere classified: Secondary | ICD-10-CM

## 2021-12-06 DIAGNOSIS — R293 Abnormal posture: Secondary | ICD-10-CM

## 2021-12-06 LAB — RAD ONC ARIA SESSION SUMMARY
Course Elapsed Days: 17
Plan Fractions Treated to Date: 14
Plan Prescribed Dose Per Fraction: 2 Gy
Plan Total Fractions Prescribed: 35
Plan Total Prescribed Dose: 70 Gy
Reference Point Dosage Given to Date: 28 Gy
Reference Point Session Dosage Given: 2 Gy
Session Number: 14

## 2021-12-06 NOTE — Therapy (Signed)
?OUTPATIENT PHYSICAL THERAPY TREATMENT NOTE ? ? ?Patient Name: Arthur Burke ?MRN: [id number] ?DOB:[birth year], 56 y.o., male ?Today's Date: 12/06/2021 ? ?PCP: Simona Huh, NP ?REFERRING PROVIDER: Eppie Gibson, MD ? ?END OF SESSION:  ? PT End of Session - 12/06/21 1329   ? ? Visit Number 2   ? Number of Visits 9   ? Date for PT Re-Evaluation 12/26/21   ? PT Start Time 1330   ? PT Stop Time 1418   ? PT Time Calculation (min) 48 min   ? Activity Tolerance Patient tolerated treatment well   ? Behavior During Therapy [facility] for tasks assessed/performed   ? ?  ?  ? ?  ? ? ?Past Medical History:  ?Diagnosis Date  ? Acute osteomyelitis of left calcaneus (HCC)   ? Diabetes mellitus without complication ([place])   ? Diabetic foot infection ([place])   ? Hypertension   ? Infected ulcer of skin, with fat layer exposed ([place])   ? Lower limb ulcer, ankle, left, with necrosis of muscle ([place])   ? MRSA (methicillin resistant staph aureus) culture positive   ? Renal disorder   ? Vision impairment   ? ?Past Surgical History:  ?Procedure Laterality Date  ? AMPUTATION Left 08/23/2017  ? Procedure: AMPUTATION BELOW KNEE;  Surgeon: Newt Minion, MD;  Location: [place];  Service: Orthopedics;  Laterality: Left;  ? APPLICATION OF WOUND VAC Left 08/23/2017  ? Procedure: APPLICATION OF WOUND VAC;  Surgeon: Newt Minion, MD;  Location: [place];  Service: Orthopedics;  Laterality: Left;  ? IR GASTROSTOMY TUBE MOD SED  10/22/2021  ? IR IMAGING GUIDED PORT INSERTION  10/22/2021  ? KIDNEY TRANSPLANT  2006  ? [facility]  ? SKIN GRAFT    ? L diabetic foot wound with debriedment and skin grafting  ? TOE AMPUTATION    ? R great and 2nd toe  ? ?Patient Active Problem List  ? Diagnosis Date Noted  ? Cancer of tonsillar fossa ([place]) 11/09/2021  ? Nausea 11/07/2021  ? Malnutrition of moderate degree 10/26/2021  ? Type 1 diabetes mellitus with hyperglycemia (HCC)   ? Dysphagia   ? Dehydration with hyponatremia 10/21/2021  ? Oropharyngeal cancer ([place]) 10/21/2021  ? Fall   ?  Essential hypertension   ? Unilateral complete BKA, left, sequela ([place])   ? Leukocytosis   ? AKI (acute kidney injury) ([place])   ? Diabetic peripheral neuropathy associated with type 2 diabetes mellitus ([place])   ? Amputation of left lower extremity below knee ([place]) 08/26/2017  ? Anemia in chronic kidney disease   ? Unilateral complete BKA, left, subsequent encounter ([place])   ? Post-operative pain   ? Stage 3 chronic kidney disease ([place])   ? Hyponatremia   ? Benign essential HTN   ? Acute blood loss anemia   ? S/P BKA (below knee amputation) unilateral, left ([place]) 08/23/2017  ? Streptococcal bacteremia 08/23/2017  ? Normocytic anemia   ? History of kidney transplant   ? 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 ([place])   ? ? ?REFERRING DIAG: C10.9 (ICD-10-CM) - Oropharyngeal cancer ([place]) ? ?THERAPY DIAG:  ?Lymphedema, not elsewhere classified ? ?Abnormal posture ? ?Oropharyngeal carcinoma ([place]) ? ?PERTINENT HISTORY: Squamous Cell carcinoma of the Oropharynx, stage III (T2, N3, M0, p 16 +). He presented to the ED on 10/21/21 with a chronic enlarging neck mass and swallowing difficulties. 10/21/21 CT neck a bulky  mass at the left glossotonsillar sulcus measuring 3.2 cm, cervical adenopathy, and multiple and bulky bilateral nodal metastases. Possible extracapsular tumor extension was also noted. 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 11/09/21 PET revealed no evidence of disease beyond the head and neck region.  It confirms the bilateral adenopathy in his neck as well as the primary extending from the tonsillar fossa through the left base of tongue. Consult with Dr. Chryl Heck and Dr. Isidore Moos on 11/09/21. He will not receive chemotherapy due to comorbidities. CT simulation completed 11/09/21 as well. Will receive 35 fractions of radiation to his Oropharynx and bilateral  neck.  He started on 11/19/21 and will complete on 01/04/22. PAC & PEG 10/22/21 as an inpatient. History of Type 1 diabetes. S/P Left BKA. He usually uses a prothesis, but it works better without it while receiving radiation. Hx Kidney transplant at [facility], 2006 ? ?PRECAUTIONS: Active CA and Comment (hx of kidney transplant, L BKA) ? ?SUBJECTIVE: They have to make a new mask because of my swelling  ? ?PAIN:  ?Are you having pain? No - sometimes I feel like it is getting tender  ?  ?LYMPHEDEMA ASSESSMENT:  ?  ?  Circumference in cm 12/06/21  ?4 cm superior to sternal notch around neck 42   ?6 cm superior to sternal notch around neck 44.2   ?8 cm superior to sternal notch around neck 46   ?10 cm superior to sternal notch around neck 48   ?Chin strap widest part - not including tumor  28.5 - 28.0 post MT today  ?(Blank rows=not tested) ?  ?GAIT: ?           Assessed: No pt did not have his prosthetic leg ?  ?PATIENT EDUCATION:  ?Education details: Neck ROM, importance of posture when sitting, standing and lying down, deep breathing, walking program and importance of staying active throughout treatment, CURE article on staying active, "Why exercise?" flyer, lymphedema and PT info, anatomy and physiology of the lymphatic system, how to treat lymphedema ?Person educated: Patient, Brother ?Education method: Explanation, Demonstration, Handout ?Education comprehension: Patient verbalized understanding and returned demonstration ?  ?  ?HOME EXERCISE PROGRAM: ?Patient was instructed today in a home exercise program today for head and neck range of motion exercises. These included active cervical flexion, active cervical extension, active cervical rotation to each direction, upper trap stretch, and shoulder retraction. Patient was encouraged to do these 2-3 times a day, holding for 5 sec each and completing for 5 reps. Pt was educated that once this becomes easier then hold the stretches for 30-60 seconds.  ?             ?             TREATMENT TODAY: ?12/06/21 ?Instructed pt using [facility] anterior approach handout as follows: short neck, 5 diaphragmatic breaths, bilateral axillary nodes, bilateral pectoral nodes, anterior chest, short neck, posterior neck, modified due to tumor involvement, moving fluid towards pathway aimed at lateral neck, then lateral and anterior neck moving fluid towards pathway aimed at lateral neck and axillae, then retracing all steps. Reviewing steps throughout, MLD reasons / anatomy and physiology, reasons for compression.  ?Made new compression out of 1/2 black foam waffle cut dome shape in tg soft as pt thought chip pack was uncomfortable ? ? ?11/28/21 ?            Briefly educated pt in very basic self MLD  technique to anterior neck - will instruct in full sequence at next visit ?            Created foam chip pack for pt to wear around head and neck for compression educating pt not to tie it too tightly and to wear it as much as possible ?  ?ASSESSMENT: ?  ?CLINICAL IMPRESSION: ?Started MLD with education on self MLD. Performed each step in seated in wheelchair avoiding tumor/lymph node mass bilateral lateral neck.  Pt was given a new flat foam piece to wear instead of chip pack due to soft edema.  Consider regular garment? Pt decreased 0.5cm after MLD today which is promising.  ?  ?REHAB POTENTIAL: Good ?  ?CLINICAL DECISION MAKING: Stable/uncomplicated ?  ?EVALUATION COMPLEXITY: Low ?  ?  ?GOALS: ?Goals reviewed with patient? YES ?  ?LONG TERM GOALS: (STG=LTG) ?  ?  Name Target Date Goal status  ?1 Patient will be able to verbalize understanding of a home exercise program for cervical range of motion, posture, and walking.   ?Baseline:  No knowledge 11/28/2021 Achieved at eval  ?2 Patient will be able to verbalize understanding of proper sitting and standing posture. ?Baseline:  No knowledge 11/28/2021 Achieved at eval  ?3 Patient will be able to verbalize understanding of lymphedema risk and availability of treatment  for this condition ?Baseline:  No knowledge 11/28/2021 Achieved at eval  ?4 Pt will demonstrate a return to full cervical ROM and function post operatively compared to baselines and not demonstrate any si

## 2021-12-07 ENCOUNTER — Ambulatory Visit
Admission: RE | Admit: 2021-12-07 | Discharge: 2021-12-07 | Disposition: A | Discharge status: Home / Self Care | Payer: Medicare Other | Source: Ambulatory Visit | Attending: Radiation Oncology | Admitting: Radiation Oncology

## 2021-12-07 ENCOUNTER — Telehealth: Payer: Self-pay | Admitting: Nutrition

## 2021-12-07 ENCOUNTER — Other Ambulatory Visit: Payer: Self-pay | Admitting: Nutrition

## 2021-12-07 ENCOUNTER — Encounter: Payer: Self-pay | Admitting: Nutrition

## 2021-12-07 ENCOUNTER — Other Ambulatory Visit: Payer: Self-pay

## 2021-12-07 DIAGNOSIS — C090 Malignant neoplasm of tonsillar fossa: Secondary | ICD-10-CM | POA: Diagnosis not present

## 2021-12-07 DIAGNOSIS — C109 Malignant neoplasm of oropharynx, unspecified: Secondary | ICD-10-CM

## 2021-12-07 DIAGNOSIS — E871 Hypo-osmolality and hyponatremia: Secondary | ICD-10-CM

## 2021-12-07 DIAGNOSIS — Z5181 Encounter for therapeutic drug level monitoring: Secondary | ICD-10-CM

## 2021-12-07 LAB — RAD ONC ARIA SESSION SUMMARY
Course Elapsed Days: 18
Plan Fractions Treated to Date: 15
Plan Prescribed Dose Per Fraction: 2 Gy
Plan Total Fractions Prescribed: 35
Plan Total Prescribed Dose: 70 Gy
Reference Point Dosage Given to Date: 30 Gy
Reference Point Session Dosage Given: 2 Gy
Session Number: 15

## 2021-12-07 LAB — BASIC METABOLIC PANEL - CANCER CENTER ONLY
Anion gap: 4 — ABNORMAL LOW (ref 5–15)
BUN: 77 mg/dL — ABNORMAL HIGH (ref 6–20)
CO2: 33 mmol/L — ABNORMAL HIGH (ref 22–32)
Calcium: 8.8 mg/dL — ABNORMAL LOW (ref 8.9–10.3)
Chloride: 95 mmol/L — ABNORMAL LOW (ref 98–111)
Creatinine: 2.55 mg/dL — ABNORMAL HIGH (ref 0.61–1.24)
GFR, Estimated: 29 mL/min — ABNORMAL LOW (ref >60)
Glucose, Bld: 340 mg/dL — ABNORMAL HIGH (ref 70–99)
Potassium: 5.1 mmol/L (ref 3.5–5.1)
Sodium: 132 mmol/L — ABNORMAL LOW (ref 135–145)

## 2021-12-07 MED ORDER — NEPRO/CARBSTEADY PO LIQD: ORAL | 6 refills | Status: DC

## 2021-12-07 NOTE — Progress Notes (Signed)
Weight 158.6 pounds ?Labs on 4/24: Glucose 241, BUN 76, Creatinine 2.26, Na 128, K 5.6. ? ?Estimated Nutrition Needs: 2100-2300 cal, 105-120 g pro ? ?After secure chat discussion with Dr. Court Joy, will change TF to 5 cartons Nepro Carb Steady via PEG. This provides 2135 calories, 95 grams protein, 1160 mL free water, 1250 mg Na, 1125 mg potassium, and 190 grams CHO. ? ?Instructed patient to give 1 carton nepro carb steady every 3 hours with 30 mL salt water flush (1 TBSP salt dissolved in 300 mL water) before and after each bolus feeding. ? ?TF orders written. Home Health contacted. ?

## 2021-12-07 NOTE — Telephone Encounter (Signed)
New TF orders written. ?

## 2021-12-08 ENCOUNTER — Telehealth: Payer: Self-pay | Admitting: Internal Medicine

## 2021-12-08 NOTE — Telephone Encounter (Signed)
Called and reviewed labs with Arthur Burke. His Cr is trending up. Could represent fluctuation, so will check again on Monday.He has stopped Lasix and the edema has not returned. His corrected Na is normal. His K is borderline high and he did not take lokelma prescribed. He will take doses I prescribed. I am working with RD on changing Tube Feeds to Nephro Carb Steady. If trending up I will ask patient to be seen at [facility]. I asked him either way to call [facility] nephrologist for a clinic appointment. He was not aware I was going to check Tacrolimus level and took Tacrolimus before it was drawn . I will ask him to hold it and labs be drawn on Monday at cancer center.  ?

## 2021-12-09 ENCOUNTER — Telehealth: Payer: Self-pay | Admitting: Internal Medicine

## 2021-12-09 NOTE — Telephone Encounter (Signed)
Spoke to Arthur Burke. He will hold morning dose of Tacrolimus for labs to be drawn tomorrow afternoon.  ?

## 2021-12-10 ENCOUNTER — Other Ambulatory Visit: Payer: Self-pay

## 2021-12-10 ENCOUNTER — Ambulatory Visit
Admission: RE | Admit: 2021-12-10 | Discharge: 2021-12-10 | Disposition: A | Discharge status: Home / Self Care | Payer: Medicare Other | Source: Ambulatory Visit | Attending: Radiation Oncology | Admitting: Radiation Oncology

## 2021-12-10 ENCOUNTER — Telehealth: Payer: Self-pay | Admitting: *Deleted

## 2021-12-10 DIAGNOSIS — E871 Hypo-osmolality and hyponatremia: Secondary | ICD-10-CM | POA: Insufficient documentation

## 2021-12-10 DIAGNOSIS — Z79621 Long term (current) use of calcineurin inhibitor: Secondary | ICD-10-CM

## 2021-12-10 DIAGNOSIS — Z5181 Encounter for therapeutic drug level monitoring: Secondary | ICD-10-CM | POA: Insufficient documentation

## 2021-12-10 DIAGNOSIS — C109 Malignant neoplasm of oropharynx, unspecified: Secondary | ICD-10-CM | POA: Diagnosis present

## 2021-12-10 DIAGNOSIS — C090 Malignant neoplasm of tonsillar fossa: Secondary | ICD-10-CM | POA: Diagnosis present

## 2021-12-10 LAB — RAD ONC ARIA SESSION SUMMARY
Course Elapsed Days: 21
Plan Fractions Treated to Date: 1
Plan Prescribed Dose Per Fraction: 2 Gy
Plan Total Fractions Prescribed: 20
Plan Total Prescribed Dose: 40 Gy
Reference Point Dosage Given to Date: 32 Gy
Reference Point Session Dosage Given: 2 Gy
Session Number: 16

## 2021-12-10 LAB — BASIC METABOLIC PANEL - CANCER CENTER ONLY
Anion gap: 3 — ABNORMAL LOW (ref 5–15)
BUN: 76 mg/dL — ABNORMAL HIGH (ref 6–20)
CO2: 33 mmol/L — ABNORMAL HIGH (ref 22–32)
Calcium: 8.6 mg/dL — ABNORMAL LOW (ref 8.9–10.3)
Chloride: 105 mmol/L (ref 98–111)
Creatinine: 2.42 mg/dL — ABNORMAL HIGH (ref 0.61–1.24)
GFR, Estimated: 31 mL/min — ABNORMAL LOW (ref >60)
Glucose, Bld: 297 mg/dL — ABNORMAL HIGH (ref 70–99)
Potassium: 5.2 mmol/L — ABNORMAL HIGH (ref 3.5–5.1)
Sodium: 141 mmol/L (ref 135–145)

## 2021-12-10 LAB — TACROLIMUS LEVEL: Tacrolimus (FK506) - LabCorp: 10.9 ng/mL (ref 2.0–20.0)

## 2021-12-10 NOTE — Telephone Encounter (Signed)
CALLED PATIENT TO ASK ABOUT COMING IN FOR LAB TODAY @ 3 PM PRIOR TO [place] LVM FOR A RETURN CALL ?

## 2021-12-11 ENCOUNTER — Ambulatory Visit
Admission: RE | Admit: 2021-12-11 | Discharge: 2021-12-11 | Disposition: A | Discharge status: Home / Self Care | Payer: Medicare Other | Source: Ambulatory Visit | Attending: Radiation Oncology | Admitting: Radiation Oncology

## 2021-12-11 ENCOUNTER — Telehealth: Payer: Self-pay | Admitting: Internal Medicine

## 2021-12-11 ENCOUNTER — Inpatient Hospital Stay: Payer: Medicare Other | Admitting: Nutrition

## 2021-12-11 ENCOUNTER — Encounter: Payer: Medicare Other | Admitting: Nutrition

## 2021-12-11 ENCOUNTER — Other Ambulatory Visit: Payer: Self-pay

## 2021-12-11 ENCOUNTER — Inpatient Hospital Stay: Payer: Medicare Other | Attending: Nutrition | Admitting: Nutrition

## 2021-12-11 DIAGNOSIS — Z5181 Encounter for therapeutic drug level monitoring: Secondary | ICD-10-CM | POA: Diagnosis not present

## 2021-12-11 DIAGNOSIS — C090 Malignant neoplasm of tonsillar fossa: Secondary | ICD-10-CM

## 2021-12-11 LAB — RAD ONC ARIA SESSION SUMMARY
Course Elapsed Days: 22
Plan Fractions Treated to Date: 2
Plan Prescribed Dose Per Fraction: 2 Gy
Plan Total Fractions Prescribed: 20
Plan Total Prescribed Dose: 40 Gy
Reference Point Dosage Given to Date: 34 Gy
Reference Point Session Dosage Given: 2 Gy
Session Number: 17

## 2021-12-11 NOTE — Progress Notes (Signed)
Nutrition follow-up completed with patient and his brother status post radiation therapy for oropharyngeal cancer. ? ?Labs include sodium 141, potassium 5.2, glucose 297, BUN 76, creatinine 2.42 on May 1. ? ?Patient reports he started Nepro carb steady last night.  He tolerated 1-1/2 cartons in 1 feeding with 30 mL of water before and after bolus.  He prefers to give 4 bolus feedings daily: 1-1/2 cartons twice a day and 1 carton twice a day to total his 5 cartons daily. ?He reports receiving message from Dr. Court Joy to discontinue salt water flushes and to be sure he is taking Lokelma.   ?Patient reports he has been drinking water and unsweetened ice tea.  He denies difficulty swallowing these liquids.  Reports drinking 1/2 gallon of unsweetened tea yesterday.  He cannot tell me how much total fluid he is drinking.   ?Reports blood sugar this morning was 163. ?He has not yet called his nephrologist for follow-up appointment. ?Reports constipation is somewhat improved but he is still investigating a stool softener. ? ?Nutrition diagnosis: Moderate malnutrition, ongoing ? ?Estimated nutrition needs: 2100-2300 cal, 105-120 g protein, approximately 2 L fluid. ? ?5 cartons Nepro carb steady daily provides 2135 cal, 95 g protein, 1160 mL free water, 1250 mg sodium, 1125 mg potassium, and 190 g carbohydrate.  Patient will give 30 mL free water before and after bolus feedings to total 240 mL / 1400 mL of water daily. ? ?Intervention: ?Continue Nepro carb steady, goal rate of 5 cartons daily with free water flushes of 30 mL before and after bolus feedings. ?Patient understands to discontinue salt water. ?Patient also verbalizes understanding to continue [facility] per MD. ?Continue bowel regimen. ?Clarify with MD the total amount of fluid patient allowed to consume daily. ? ?Monitoring, evaluation, goals: ?Patient will work to tolerate tube feeding to provide greater than 90% minimum calorie and protein needs. ? ?Next visit:  Tuesday, May 9 after radiation therapy. ? ?**Disclaimer: This note was dictated with voice recognition software. Similar sounding words can inadvertently be transcribed and this note may contain transcription errors which may not have been corrected upon publication of note.** ? ?

## 2021-12-11 NOTE — Telephone Encounter (Signed)
Called Arthur Burke and left voicemail to stop salt supplementation. Na has corrected to high end of normal with stopping lasix and salt. Patient has just started taking Lokelma, he should finish 3 packets prescribed. Plan to touch base with RD to discuss where patient is on tube feed supplement change. Cr trended down. Recommended patient schedule with his nephrologist in next 2 weeks . Will ask for labs to be drawn once a week for now so we can monitor electrolytes and kidney function. Tacrolimus level 4/28 drawn without patient holding dose. Will follow Tacrolimus level drawn 5/1.  ?

## 2021-12-12 ENCOUNTER — Ambulatory Visit: Payer: Medicare Other | Attending: Radiation Oncology | Admitting: Physical Therapy

## 2021-12-12 ENCOUNTER — Encounter: Payer: Self-pay | Admitting: Physical Therapy

## 2021-12-12 ENCOUNTER — Other Ambulatory Visit: Payer: Self-pay

## 2021-12-12 ENCOUNTER — Ambulatory Visit
Admission: RE | Admit: 2021-12-12 | Discharge: 2021-12-12 | Disposition: A | Discharge status: Home / Self Care | Payer: Medicare Other | Source: Ambulatory Visit | Attending: Radiation Oncology | Admitting: Radiation Oncology

## 2021-12-12 DIAGNOSIS — R1310 Dysphagia, unspecified: Secondary | ICD-10-CM | POA: Insufficient documentation

## 2021-12-12 DIAGNOSIS — C109 Malignant neoplasm of oropharynx, unspecified: Secondary | ICD-10-CM | POA: Diagnosis present

## 2021-12-12 DIAGNOSIS — I890 Lymphedema, not elsewhere classified: Secondary | ICD-10-CM | POA: Insufficient documentation

## 2021-12-12 DIAGNOSIS — R293 Abnormal posture: Secondary | ICD-10-CM | POA: Insufficient documentation

## 2021-12-12 DIAGNOSIS — Z5181 Encounter for therapeutic drug level monitoring: Secondary | ICD-10-CM | POA: Diagnosis not present

## 2021-12-12 LAB — RAD ONC ARIA SESSION SUMMARY
Course Elapsed Days: 23
Plan Fractions Treated to Date: 3
Plan Prescribed Dose Per Fraction: 2 Gy
Plan Total Fractions Prescribed: 20
Plan Total Prescribed Dose: 40 Gy
Reference Point Dosage Given to Date: 36 Gy
Reference Point Session Dosage Given: 2 Gy
Session Number: 18

## 2021-12-12 LAB — TACROLIMUS LEVEL: Tacrolimus (FK506) - LabCorp: 4.2 ng/mL (ref 2.0–20.0)

## 2021-12-12 NOTE — Therapy (Addendum)
OUTPATIENT PHYSICAL THERAPY TREATMENT NOTE   Patient Name: Arthur Burke MRN: [id number] DOB:January 15, 1966, 56 y.o., male Today's Date: 12/12/2021  PCP: Simona Huh, NP REFERRING PROVIDER: Eppie Gibson, MD  END OF SESSION:   PT End of Session - 12/12/21 1550     Visit Number 3    Number of Visits 9    Date for PT Re-Evaluation 12/26/21    PT Start Time 1548    PT Stop Time 1647    PT Time Calculation (min) 59 min    Activity Tolerance Patient tolerated treatment well    Behavior During Therapy WFL for tasks assessed/performed             Past Medical History:  Diagnosis Date   Acute osteomyelitis of left calcaneus ([place])    Diabetes mellitus without complication ([place])    Diabetic foot infection ([place])    Hypertension    Infected ulcer of skin, with fat layer exposed ([place])    Lower limb ulcer, ankle, left, with necrosis of muscle ([place])    MRSA (methicillin resistant staph aureus) culture positive    Renal disorder    Vision impairment    Past Surgical History:  Procedure Laterality Date   AMPUTATION Left 08/23/2017   Procedure: AMPUTATION BELOW KNEE;  Surgeon: Newt Minion, MD;  Location: [place];  Service: Orthopedics;  Laterality: Left;   APPLICATION OF WOUND VAC Left 08/23/2017   Procedure: APPLICATION OF WOUND VAC;  Surgeon: Newt Minion, MD;  Location: [place];  Service: Orthopedics;  Laterality: Left;   IR GASTROSTOMY TUBE MOD SED  10/22/2021   IR IMAGING GUIDED PORT INSERTION  10/22/2021   KIDNEY TRANSPLANT  2006   [facility]   SKIN GRAFT     L diabetic foot wound with debriedment and skin grafting   TOE AMPUTATION     R great and 2nd toe   Patient Active Problem List   Diagnosis Date Noted   Cancer of tonsillar fossa ([place]) 11/09/2021   Nausea 11/07/2021   Malnutrition of moderate degree 10/26/2021   Type 1 diabetes mellitus with hyperglycemia (HCC)    Dysphagia    Dehydration with hyponatremia 10/21/2021   Oropharyngeal cancer ([place]) 10/21/2021   Fall     Essential hypertension    Unilateral complete BKA, left, sequela (HCC)    Leukocytosis    AKI (acute kidney injury) ([place])    Diabetic peripheral neuropathy associated with type 2 diabetes mellitus ([place])    Amputation of left lower extremity below knee ([place]) 08/26/2017   Anemia in chronic kidney disease    Unilateral complete BKA, left, subsequent encounter ([place])    Post-operative pain    Stage 3 chronic kidney disease (HCC)    Hyponatremia    Benign essential HTN    Acute blood loss anemia    S/P BKA (below knee amputation) unilateral, left ([place]) 08/23/2017   Streptococcal bacteremia 08/23/2017   Normocytic anemia    History of kidney transplant    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 He presented to the ED on 10/21/21 with a chronic enlarging neck mass and swallowing difficulties. 10/21/21 CT neck a bulky  mass at the left glossotonsillar sulcus measuring 3.2 cm, cervical adenopathy, and multiple and bulky bilateral nodal metastases. Possible extracapsular tumor extension was also noted. 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 11/09/21 PET revealed no evidence of disease beyond the head and neck region.  It confirms the bilateral adenopathy in his neck as well as the primary extending from the tonsillar fossa through the left base of tongue. Consult with Dr. Chryl Heck and Dr. Isidore Moos on 11/09/21. He will not receive chemotherapy due to comorbidities. CT simulation completed 11/09/21 as well. Will receive 35 fractions of radiation to his Oropharynx and bilateral  neck.  He started on 11/19/21 and will complete on 01/04/22. PAC & PEG 10/22/21 as an inpatient. History of Type 1 diabetes. S/P Left BKA. He usually uses a prothesis, but it works better without it while receiving radiation. Hx Kidney transplant at [facility], 2006  PRECAUTIONS: Active CA and Comment (hx of kidney transplant, L BKA)  SUBJECTIVE: I can't wear that thing around my neck very long  PAIN:  Are you having pain? Yes: NPRS scale: 10/10 Pain location: R neck Pain description: sharp, tight Aggravating factors: moving quickly Relieving factors: time     LYMPHEDEMA ASSESSMENT:      Circumference in cm 12/06/21 12/12/21  4 cm superior to sternal notch around neck 42    6 cm superior to sternal notch around neck 44.2    8 cm superior to sternal notch around neck 46    10 cm superior to sternal notch around neck 48    Chin strap widest part - not including tumor  28.5 - 28.0 post MT today 28.5  (Blank rows=not tested)   GAIT:            Assessed: No pt did not have his prosthetic leg   PATIENT EDUCATION:  Education details: Neck ROM, importance of posture when sitting, standing and lying down, deep breathing, walking program and importance of staying active throughout treatment, CURE article on staying active, "Why exercise?" flyer, lymphedema and PT info, anatomy and physiology of the lymphatic system, how to treat lymphedema Person educated: Patient, Brother Education method: Explanation, Demonstration, Handout Education comprehension: Patient verbalized understanding and returned demonstration     HOME EXERCISE PROGRAM: Patient was instructed today in a home exercise program today for head and neck range of motion exercises. These included active cervical flexion, active cervical extension, active cervical rotation to each direction, upper trap stretch, and shoulder retraction. Patient was encouraged to do these 2-3 times a day, holding for 5 sec each and completing for 5 reps. Pt was  educated that once this becomes easier then hold the stretches for 30-60 seconds.                           TREATMENT TODAY: 12/12/21 Continued to instruct pt using [facility] anterior approach handout as follows: short neck, 5 diaphragmatic breaths, bilateral axillary nodes, bilateral pectoral nodes, anterior chest, short neck, posterior neck, modified due to tumor involvement, moving fluid towards pathway aimed at lateral neck, then lateral and anterior neck moving fluid towards pathway aimed at lateral neck and axillae, then retracing all steps. Reviewing steps throughout, and having pt return demonstrate stretch while demonstrating correct skin stretch technique. Therapist provided moderate verbal and tactile cues for pt to perform correctly.  STM: to R levator, R upper traps and R posterior cervical muscles where pt  has increased tenderness and tightness  12/06/21 Instructed pt using [facility] anterior approach handout as follows: short neck, 5 diaphragmatic breaths, bilateral axillary nodes, bilateral pectoral nodes, anterior chest, short neck, posterior neck, modified due to tumor involvement, moving fluid towards pathway aimed at lateral neck, then lateral and anterior neck moving fluid towards pathway aimed at lateral neck and axillae, then retracing all steps. Reviewing steps throughout, MLD reasons / anatomy and physiology, reasons for compression.  Made new compression out of 1/2 black foam waffle cut dome shape in tg soft as pt thought chip pack was uncomfortable   11/28/21             Briefly educated pt in very basic self MLD technique to anterior neck - will instruct in full sequence at next visit             Created foam chip pack for pt to wear around head and neck for compression educating pt not to tie it too tightly and to wear it as much as possible   ASSESSMENT:   CLINICAL IMPRESSION: Continued to instruct pt in self MLD technique and had pt return demonstrate each step. Therapist  provided verbal and tactile cues for pt to perform correct skin stretch and in correct sequence. Pt is having trouble tolerating compression garment. Had pt don it and therapist checked for pressure and it was not tied too tightly. Pt to continue to try to wear chip pack as much as possible and to continue to practice self MLD technique. Added soft tissue mobilization to R neck due to pain and difficulty with cervical ROM due to R neck tightness.    REHAB POTENTIAL: Good   CLINICAL DECISION MAKING: Stable/uncomplicated   EVALUATION COMPLEXITY: Low     GOALS: Goals reviewed with patient? YES   LONG TERM GOALS: (STG=LTG)     Name Target Date Goal status  1 Patient will be able to verbalize understanding of a home exercise program for cervical range of motion, posture, and walking.   Baseline:  No knowledge 11/28/2021 Achieved at eval  2 Patient will be able to verbalize understanding of proper sitting and standing posture. Baseline:  No knowledge 11/28/2021 Achieved at eval  3 Patient will be able to verbalize understanding of lymphedema risk and availability of treatment for this condition Baseline:  No knowledge 11/28/2021 Achieved at eval  4 Pt will demonstrate a return to full cervical ROM and function post operatively compared to baselines and not demonstrate any signs or symptoms of lymphedema.  Baseline: See objective measurements taken today. 12/26/2021 New  5 Pt will obtain an appropriate compression garment for long term management of lymphedema. 12/26/21 New  6 Pt will be independent in self MLD for long term management of lymphedema. 12/26/21 New  7 Pt will demonstrate a 2 cm decrease in edema 10 cm superior to sternal notch to decrease risk of infection. 12/26/21 New        PLAN: PT FREQUENCY/DURATION: 2x/wk for 4 weeks   PLAN FOR NEXT SESSION: instruct pt in self MLD using [facility] handout, eventually assist with obtaining a compression garment   [employer],  PT 12/12/2021, 4:57 PM   PHYSICAL THERAPY DISCHARGE SUMMARY  Visits from Start of Care: 3  Current functional level related to goals / functional outcomes: See above   Remaining deficits: See above   Education / Equipment: MLD, compression garment   Patient agrees to discharge. Patient goals were not met. Patient is being discharged due  to not returning since the last visit.  Allyson Sabal [place], [place] 04/10/22 2:30 PM

## 2021-12-13 ENCOUNTER — Ambulatory Visit
Admission: RE | Admit: 2021-12-13 | Discharge: 2021-12-13 | Disposition: A | Discharge status: Home / Self Care | Payer: Medicare Other | Source: Ambulatory Visit | Attending: Radiation Oncology | Admitting: Radiation Oncology

## 2021-12-13 ENCOUNTER — Other Ambulatory Visit: Payer: Self-pay

## 2021-12-13 DIAGNOSIS — Z5181 Encounter for therapeutic drug level monitoring: Secondary | ICD-10-CM | POA: Diagnosis not present

## 2021-12-13 LAB — RAD ONC ARIA SESSION SUMMARY
Course Elapsed Days: 24
Plan Fractions Treated to Date: 4
Plan Prescribed Dose Per Fraction: 2 Gy
Plan Total Fractions Prescribed: 20
Plan Total Prescribed Dose: 40 Gy
Reference Point Dosage Given to Date: 38 Gy
Reference Point Session Dosage Given: 2 Gy
Session Number: 19

## 2021-12-14 ENCOUNTER — Ambulatory Visit
Admission: RE | Admit: 2021-12-14 | Discharge: 2021-12-14 | Disposition: A | Discharge status: Home / Self Care | Payer: Medicare Other | Source: Ambulatory Visit | Attending: Radiation Oncology | Admitting: Radiation Oncology

## 2021-12-14 ENCOUNTER — Other Ambulatory Visit: Payer: Self-pay

## 2021-12-14 DIAGNOSIS — Z5181 Encounter for therapeutic drug level monitoring: Secondary | ICD-10-CM | POA: Diagnosis not present

## 2021-12-14 DIAGNOSIS — N183 Chronic kidney disease, stage 3 unspecified: Secondary | ICD-10-CM

## 2021-12-14 LAB — RAD ONC ARIA SESSION SUMMARY
Course Elapsed Days: 25
Plan Fractions Treated to Date: 5
Plan Prescribed Dose Per Fraction: 2 Gy
Plan Total Fractions Prescribed: 20
Plan Total Prescribed Dose: 40 Gy
Reference Point Dosage Given to Date: 40 Gy
Reference Point Session Dosage Given: 2 Gy
Session Number: 20

## 2021-12-16 ENCOUNTER — Emergency Department (HOSPITAL_COMMUNITY): Payer: Medicare Other

## 2021-12-16 ENCOUNTER — Emergency Department (HOSPITAL_COMMUNITY)
Admission: EM | Admit: 2021-12-16 | Discharge: 2021-12-16 | Disposition: A | Discharge status: Home / Self Care | Payer: Medicare Other | Attending: Emergency Medicine | Admitting: Emergency Medicine

## 2021-12-16 ENCOUNTER — Other Ambulatory Visit: Payer: Self-pay

## 2021-12-16 ENCOUNTER — Encounter (HOSPITAL_COMMUNITY): Payer: Self-pay | Admitting: Emergency Medicine

## 2021-12-16 DIAGNOSIS — N182 Chronic kidney disease, stage 2 (mild): Secondary | ICD-10-CM | POA: Insufficient documentation

## 2021-12-16 DIAGNOSIS — T451X5A Adverse effect of antineoplastic and immunosuppressive drugs, initial encounter: Secondary | ICD-10-CM | POA: Diagnosis not present

## 2021-12-16 DIAGNOSIS — R609 Edema, unspecified: Secondary | ICD-10-CM

## 2021-12-16 DIAGNOSIS — R600 Localized edema: Secondary | ICD-10-CM | POA: Insufficient documentation

## 2021-12-16 DIAGNOSIS — Z79899 Other long term (current) drug therapy: Secondary | ICD-10-CM | POA: Diagnosis not present

## 2021-12-16 DIAGNOSIS — R220 Localized swelling, mass and lump, head: Secondary | ICD-10-CM | POA: Diagnosis present

## 2021-12-16 DIAGNOSIS — Z794 Long term (current) use of insulin: Secondary | ICD-10-CM | POA: Insufficient documentation

## 2021-12-16 DIAGNOSIS — T66XXXA Radiation sickness, unspecified, initial encounter: Secondary | ICD-10-CM

## 2021-12-16 DIAGNOSIS — C090 Malignant neoplasm of tonsillar fossa: Secondary | ICD-10-CM | POA: Diagnosis not present

## 2021-12-16 DIAGNOSIS — Z940 Kidney transplant status: Secondary | ICD-10-CM | POA: Diagnosis not present

## 2021-12-16 DIAGNOSIS — I129 Hypertensive chronic kidney disease with stage 1 through stage 4 chronic kidney disease, or unspecified chronic kidney disease: Secondary | ICD-10-CM | POA: Diagnosis not present

## 2021-12-16 DIAGNOSIS — E1122 Type 2 diabetes mellitus with diabetic chronic kidney disease: Secondary | ICD-10-CM | POA: Diagnosis not present

## 2021-12-16 DIAGNOSIS — R17 Unspecified jaundice: Secondary | ICD-10-CM | POA: Diagnosis not present

## 2021-12-16 HISTORY — DX: Malignant (primary) neoplasm, unspecified: C80.1

## 2021-12-16 LAB — CBC WITH DIFFERENTIAL/PLATELET
Abs Immature Granulocytes: 0.04 10*3/uL (ref 0.00–0.07)
Basophils Absolute: 0 10*3/uL (ref 0.0–0.1)
Basophils Relative: 0 %
Eosinophils Absolute: 0 10*3/uL (ref 0.0–0.5)
Eosinophils Relative: 0 %
HCT: 26.9 % — ABNORMAL LOW (ref 39.0–52.0)
Hemoglobin: 8.2 g/dL — ABNORMAL LOW (ref 13.0–17.0)
Immature Granulocytes: 0 %
Lymphocytes Relative: 17 %
Lymphs Abs: 1.8 10*3/uL (ref 0.7–4.0)
MCH: 28.2 pg (ref 26.0–34.0)
MCHC: 30.5 g/dL (ref 30.0–36.0)
MCV: 92.4 fL (ref 80.0–100.0)
Monocytes Absolute: 0.6 10*3/uL (ref 0.1–1.0)
Monocytes Relative: 6 %
Neutro Abs: 8 10*3/uL — ABNORMAL HIGH (ref 1.7–7.7)
Neutrophils Relative %: 77 %
Platelets: 250 10*3/uL (ref 150–400)
RBC: 2.91 MIL/uL — ABNORMAL LOW (ref 4.22–5.81)
RDW: 15.8 % — ABNORMAL HIGH (ref 11.5–15.5)
WBC: 10.4 10*3/uL (ref 4.0–10.5)
nRBC: 0 % (ref 0.0–0.2)

## 2021-12-16 LAB — COMPREHENSIVE METABOLIC PANEL
ALT: 12 U/L (ref 0–44)
AST: 20 U/L (ref 15–41)
Albumin: 2.7 g/dL — ABNORMAL LOW (ref 3.5–5.0)
Alkaline Phosphatase: 99 U/L (ref 38–126)
Anion gap: 14 (ref 5–15)
BUN: 63 mg/dL — ABNORMAL HIGH (ref 6–20)
CO2: 22 mmol/L (ref 22–32)
Calcium: 8.7 mg/dL — ABNORMAL LOW (ref 8.9–10.3)
Chloride: 98 mmol/L (ref 98–111)
Creatinine, Ser: 2.33 mg/dL — ABNORMAL HIGH (ref 0.61–1.24)
GFR, Estimated: 32 mL/min — ABNORMAL LOW (ref >60)
Glucose, Bld: 214 mg/dL — ABNORMAL HIGH (ref 70–99)
Potassium: 4.3 mmol/L (ref 3.5–5.1)
Sodium: 134 mmol/L — ABNORMAL LOW (ref 135–145)
Total Bilirubin: 2.4 mg/dL — ABNORMAL HIGH (ref 0.3–1.2)
Total Protein: 6.7 g/dL (ref 6.5–8.1)

## 2021-12-16 LAB — BRAIN NATRIURETIC PEPTIDE: B Natriuretic Peptide: 653.9 pg/mL — ABNORMAL HIGH (ref 0.0–100.0)

## 2021-12-16 MED ORDER — OXYCODONE HCL 5 MG PO TABS
15.0000 mg | ORAL_TABLET | Freq: Once | ORAL | Status: AC
  Administered 2021-12-16: 15 mg
  Filled 2021-12-16: qty 3

## 2021-12-16 MED ORDER — FUROSEMIDE 10 MG/ML IJ SOLN
40.0000 mg | Freq: Once | INTRAMUSCULAR | Status: AC
Start: 2021-12-16 — End: 2021-12-16
  Administered 2021-12-16: 40 mg via INTRAVENOUS
  Filled 2021-12-16: qty 4

## 2021-12-16 MED ORDER — HYDROMORPHONE HCL 1 MG/ML IJ SOLN
1.0000 mg | Freq: Once | INTRAMUSCULAR | Status: AC
  Administered 2021-12-16: 1 mg via INTRAVENOUS
  Filled 2021-12-16: qty 1

## 2021-12-16 NOTE — Discharge Instructions (Addendum)
Please start taking your Lasix as prescribed - 40 mg twice a day for the swelling. ? ?Follow up with both your oncologist tomorrow for scheduled radiation therapy as well as your PCP. They should monitor your labs and manage your lasix prescription/adjust it as needed.  ? ?One of your liver functions was slightly elevated today. Your PCP should repeat this test in 1-2 weeks.  ? ?Return to the ED for any new/worsening symptoms  ?

## 2021-12-16 NOTE — ED Provider Notes (Signed)
?[facility] ?Provider Note ? ? ?CSN: [id number] ?Arrival date & time: 12/16/21  1210 ? ?  ? ?History ? ?Chief Complaint  ?Patient presents with  ? Edema  ? ? ?Arthur Burke is a 56 y.o. 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 Pt reports that he has been having issues with sodium and potassium in the past and was told to stop taking Lasix in the middle of last week. Over the weekend he felt the swelling was worse so he took 40 mg Lasix BID on Friday and Saturday. He feels like it has not helped much with the swelling and feels like he is "behind" on his regimen now. Family checked on him today and was concerned due to worsening swelling of his face/throat. Pt currently receiving radiation treatment - last treatment 2 days ago with plans for additional treatment tomorrow. Unfortunately not a great candidate for chemo due to kidney disease. Pt states he typically has some issues with swallowing and has a G tube in place. No worsening issues with swallowing. Denies throat swelling or difficulty breathing.  ? ?The history is provided by the patient, a relative and medical records.  ? ?  ? ?Home Medications ?Prior to Admission medications   ?Medication Sig Start Date End Date Taking? Authorizing Provider  ?amLODipine (NORVASC) 10 MG tablet Place 1 tablet (10 mg total) into feeding tube daily. 11/01/21   Orvis Brill, MD  ?atorvastatin (LIPITOR) 40 MG tablet Place 1 tablet (40 mg total) into feeding tube every evening. 10/30/21   Demaio, Alexa, MD  ?Calcium 600-200 MG-UNIT tablet Place 1 tablet into feeding tube daily. 10/30/21   Demaio, Roxine Caddy, MD  ?carvedilol (COREG) 25 MG tablet Place 1 tablet (25 mg total) into feeding tube 2 (two) times daily. 10/30/21   Demaio,  Roxine Caddy, MD  ?cloNIDine (CATAPRES) 0.1 MG tablet Place 1 tablet (0.1 mg total) into feeding tube 2 (two) times daily. 10/30/21   Scarlett Presto, MD  ?Continuous Blood Gluc Receiver ([place]) [place] 1 Product by Does not apply route once a week. 11/01/21   Orvis Brill, MD  ?Continuous Blood Gluc Sensor (DEXCOM G7 SENSOR) MISC 1 Product by Does not apply route once a week. Check every 10 days 11/01/21   Orvis Brill, MD  ?[place] 100 UNIT/ML KwikPen Inject 6 Units into the skin 3 (three) times daily. 10/30/21   Demaio, Alexa, MD  ?insulin glargine (LANTUS) 100 unit/mL SOPN Inject 22 Units into the skin at bedtime. 10/30/21   Demaio, Alexa, MD  ?lactulose (CHRONULAC) 10 GM/15ML solution Take 15 mLs (10 g total) by mouth daily. 11/01/21   Orvis Brill, MD  ?lidocaine (XYLOCAINE) 2 % solution Patient: Mix 1part 2% viscous lidocaine, 1part H20. Swish & swallow 48m of diluted mixture, 362m before meals and at bedtime, up to QID 11/26/21   SqEppie GibsonMD  ?LORazepam (ATIVAN) 1 MG tablet Take 1 mg by mouth 2 (two) times daily. 11/01/21   [provider]  ?Multiple Vitamins-Minerals (VITRUM SENIOR) TABS Place 1 tablet into feeding tube daily. 10/30/21   Demaio, AlRoxine CaddyMD  ?mycophenolate (CELLCEPT) 200 MG/ML suspension Take 2.5 mLs (500 mg total) by mouth 2 (two) times daily. 12/03/21   StMadalyn RobMD  ?Nutritional Supplements (FEEDING SUPPLEMENT,  NEPRO CARB STEADY,) LIQD Change TF to Nepro Carb Steady or equivalent, 5 cartons daily, bolus feeding, via PEG with 30 mL water before and after each feeding. (Mix 1 TBSP salt into 300 mL water for free water flush.) Provides 2135 cal 95 g pro/>90% needs. 12/07/21   Eppie Gibson, MD  ?omeprazole (PRILOSEC) 40 MG capsule Take 1 capsule (40 mg total) daily. May open capsule for administration 11/26/21   Eppie Gibson, MD  ?ondansetron [facility]) 4 MG tablet Take 1 tablet (4 mg total) by mouth as needed for nausea or vomiting. 12/04/21   Madalyn Rob, MD  ?oxyCODONE (ROXICODONE) 15 MG immediate release tablet Place 1 tablet (15 mg total) into feeding tube every 4 (four) hours. 10/30/21   Demaio, Alexa, MD  ?polyethylene glycol (MIRALAX / GLYCOLAX) 17 g packet Place 17 g into feeding tube daily as needed for mild constipation. 10/30/21   Demaio, Alexa, MD  ?sodium zirconium cyclosilicate (LOKELMA) 10 g PACK packet Take 10 g by mouth daily. 12/04/21   Madalyn Rob, MD  ?tacrolimus (PROGRAF) oral suspension 1 mg/mL mixture Take 1 mL (1 mg) by mouth 2 (two) times daily. 11/07/21   Orvis Brill, MD  ?vitamin B-12 (CYANOCOBALAMIN) 500 MCG tablet Place 1 tablet (500 mcg total) into feeding tube daily. 10/30/21   Scarlett Presto, MD  ?   ? ?Allergies    ?Zolpidem   ? ?Review of Systems   ?Review of Systems  ?Constitutional:  Negative for chills and fever.  ?HENT:  Positive for facial swelling. Negative for trouble swallowing.   ?Respiratory:  Negative for shortness of breath and wheezing.   ?Cardiovascular:  Positive for leg swelling. Negative for chest pain.  ?Skin:  Negative for rash.  ?All other systems reviewed and are negative. ? ?Physical Exam ?Updated Vital Signs ?BP (!) 149/70 (BP Location: Right Arm)   Pulse 70   Temp 98.6 ?F (37 ?C) (Oral)   Resp 18   SpO2 100%  ?Physical Exam ?Vitals and nursing note reviewed.  ?Constitutional:   ?   Appearance: He is not ill-appearing.  ?HENT:  ?   Head: Atraumatic.  ?   Comments: Significant swelling to R submandibular area ?   Mouth/Throat:  ?   Comments: Phonating normally ?Eyes:  ?   Conjunctiva/sclera: Conjunctivae normal.  ?Cardiovascular:  ?   Rate and Rhythm: Normal rate and regular rhythm.  ?Pulmonary:  ?   Effort: Pulmonary effort is normal.  ?   Breath sounds: Normal breath sounds. No wheezing, rhonchi or rales.  ?   Comments: Speaking in full sentences without difficulty. Satting 100% on RA.  ?Abdominal:  ?   Palpations: Abdomen is soft.  ?   Tenderness: There is no abdominal tenderness.   ?Musculoskeletal:  ?   Cervical back: Neck supple.  ?   Comments: Left BKA ?Nonpitting edema noted to BLEs.   ?Skin: ?   General: Skin is warm and dry.  ?Neurological:  ?   Mental Status: He is alert.  ? ? ?ED Results / Procedures / Treatments   ?Labs ?(all labs ordered are listed, but only abnormal results are displayed) ?Labs Reviewed  ?CBC WITH DIFFERENTIAL/PLATELET - Abnormal; Notable for the following components:  ?    Result Value  ? RBC 2.91 (*)   ? Hemoglobin 8.2 (*)   ? HCT 26.9 (*)   ? RDW 15.8 (*)   ? Neutro Abs 8.0 (*)   ? All other components within normal limits  ?COMPREHENSIVE  METABOLIC PANEL - Abnormal; Notable for the following components:  ? Sodium 134 (*)   ? Glucose, Bld 214 (*)   ? BUN 63 (*)   ? Creatinine, Ser 2.33 (*)   ? Calcium 8.7 (*)   ? Albumin 2.7 (*)   ? Total Bilirubin 2.4 (*)   ? GFR, Estimated 32 (*)   ? All other components within normal limits  ?BRAIN NATRIURETIC PEPTIDE - Abnormal; Notable for the following components:  ? B Natriuretic Peptide 653.9 (*)   ? All other components within normal limits  ? ? ?EKG ?None ? ?Radiology ?CT Soft Tissue Neck Wo Contrast ? ?Result Date: 12/16/2021 ?CLINICAL DATA:  56 year old male with increasing right facial swelling. Throat cancer currently undergoing radiation. EXAM: CT NECK WITHOUT CONTRAST TECHNIQUE: Multidetector CT imaging of the neck was performed following the standard protocol without intravenous contrast. RADIATION DOSE REDUCTION: This exam was performed according to the departmental dose-optimization program which includes automated exposure control, adjustment of the mA and/or kV according to patient size and/or use of iterative reconstruction technique. COMPARISON:  Noncontrast neck CT 10/21/2021.  PET-CT 11/09/2021. FINDINGS: Pharynx and larynx: Generalized pharyngeal mucosal space soft tissue thickening superimposed on known left glossotonsillar mass. This includes swelling of the uvula now (series 4, image 50). The glottis is  closed. Parapharyngeal and retropharyngeal spaces remain stable and within normal limits. Salivary glands: Some sublingual space invasion by the known tumor was better demonstrated by PET-CT anterior sublingual space remains

## 2021-12-16 NOTE — ED Provider Notes (Signed)
Patient here with concern for volume retention.  History of renal transplant, now with throat cancer undergoing radiation treatment.  This is a shared visit.  Patient continues acute on chronic throat pain from his radiation.  Feels like he is having worse edema to his lower legs.  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  He has been off Lasix for the last several days but now he feels like he is having some retention.  No respiratory symptoms.  Vital signs are overall unremarkable.  No signs of respiratory distress, clear breath sounds.  Lab work per my review and interpretation overall unremarkable.  Creatinine appears to be baseline.  No significant electrolyte abnormalities.  No significant anemia or leukocytosis.  BNP 650.  However, chest x-ray with no signs of major edema.  He had a CT scan of his neck that was overall unremarkable.  Radiation changes to the subcutaneous area of the neck but there is no obvious infectious process or significant stenosis anywhere.  Patient was given some pain medication with improvement.  Recommend that he restart his Lasix.  He has radiation tomorrow and frequently has his labs checked during.  Recommend that he discuss with his primary doctor about any further changes to these medications. ? ?This chart was dictated using voice recognition software.  Despite best efforts to proofread,  errors can occur which can change the documentation meaning.  ?  Lennice Sites, DO ?12/16/21 1528 ? ?

## 2021-12-16 NOTE — ED Triage Notes (Addendum)
Pt has throat cancer and is receiving radiation treatments since 4/10.  Reports throat swelling that is gradually getting worse and edema to lower extremities.  States he has taken lasix for the past 2 days that has helped some but was previously taken off lasix due to abnormal labs.  Denies SOB. ?

## 2021-12-17 ENCOUNTER — Ambulatory Visit: Payer: Medicare Other

## 2021-12-17 ENCOUNTER — Telehealth: Payer: Self-pay | Admitting: Radiation Oncology

## 2021-12-17 NOTE — Telephone Encounter (Signed)
Patient called stating he is not feeling well, and will not be able to make his treatment scheduled 5/8. L3 notified.  ?

## 2021-12-18 ENCOUNTER — Telehealth: Payer: Self-pay | Admitting: Internal Medicine

## 2021-12-18 ENCOUNTER — Telehealth: Payer: Self-pay | Admitting: Nutrition

## 2021-12-18 ENCOUNTER — Ambulatory Visit: Payer: Medicare Other

## 2021-12-18 ENCOUNTER — Inpatient Hospital Stay: Payer: Medicare Other | Admitting: Nutrition

## 2021-12-18 ENCOUNTER — Encounter: Payer: Medicare Other | Admitting: Nutrition

## 2021-12-18 ENCOUNTER — Ambulatory Visit: Admission: RE | Admit: 2021-12-18 | Payer: Medicare Other | Source: Ambulatory Visit

## 2021-12-18 NOTE — Telephone Encounter (Signed)
Patient canceled in person appointments today due to not feeling well. ? ?No new weight. ? ?Labs include sodium 134, potassium 4.3, glucose 214, BUN 63, creatinine 2.33, hemoglobin 8.2.  ? ?Contacted patient by telephone.  He reports that he has been constipated and had a visit to the ED.  He is only tolerating 2-1/2 cartons of Nepro carb steady daily.  He confirms that he has discontinued salt water flushes. His goal rate is 5 cartons of Nepro carb steady. Reports he is still drinking tea by mouth.  He bought [place] punch but has not consumed this yet.  He still tries to drink a little Sprite.  States that he is taking MiraLAX.  Reports constipation is "starting to break loose" but he is not quite there yet. ? ?Nutrition diagnosis: Moderate malnutrition, ongoing. ? ?Intervention: ?Patient will continue bowel regimen with free water as recommended by MD. ?Continue to work to increase tube feeding to goal rate once constipation has resolved. ? ?Monitoring, evaluation, goals: ?Patient will increase tube feeding to goal rate to minimize weight loss and provide greater than 90% minimum calorie and protein needs. ? ?Next visit: Telephone follow-up on May 16. ? ?**Disclaimer: This note was dictated with voice recognition software. Similar sounding words can inadvertently be transcribed and this note may contain transcription errors which may not have been corrected upon publication of note.** ? ?

## 2021-12-18 NOTE — Telephone Encounter (Signed)
Received call from [place] at cancer center to coordinated care. I called [name] for concerns given to RN of edema and constipation.  ? ?Mr.Lorge was seen in ED 2 nights ago for Edema. He has also missed his recent radiation treatment because of constipation. He is having small hard bowel movements. In addition has had some small loose stool.  ? ?His edema is in setting of stopping his Lasix due to worsening renal function. I recommended every other day dosing of his lasix if needed to manage lower extremity edema. He is taking over the counter medications a nursing friend has helped him with. I expect he has opioid induced constipation and recommend senna-docusate. He is going to continue with medications he has at this time. It is possible he has an obstruction and the loose stool is passing around obstruction. He would like to treat at home at this time. I recommend he make appointment to be evaluated in person if not improving today. ? ?I reviewed BMP on 5/7 in ED. Kidney function stable. Potassium normal and Na corrects to normal.  ?

## 2021-12-19 ENCOUNTER — Telehealth: Payer: Self-pay

## 2021-12-19 ENCOUNTER — Encounter: Payer: Self-pay | Admitting: Licensed Clinical Social Worker

## 2021-12-19 ENCOUNTER — Ambulatory Visit: Payer: Medicare Other

## 2021-12-19 ENCOUNTER — Telehealth: Payer: Self-pay | Admitting: Radiation Oncology

## 2021-12-19 ENCOUNTER — Encounter: Payer: Medicare Other | Admitting: Physical Therapy

## 2021-12-19 NOTE — Progress Notes (Signed)
[place] CSW Progress Note ? ?Clinical Social Worker contacted patient by phone to offer support after pt has missed appts due to physical issues.  Pt reports he feels he's on the way out (improving) from the stool issues and his brother is there now helping clean up.  They are still figuring out housing changes that will likely take place in a few months but in the meantime he lives on his own. Pt stated that his sister in law is looking into having him finish treatment elsewhere, possibly in the hospital and is communicating with the insurance.   ? ?CSW reminded pt of my contact information if they need any support. In-home assistance is limited as pt does not yet qualify for Medicaid but has limited funding for private pay care. ? ? ? ?Christeen Douglas , LCSW ?

## 2021-12-19 NOTE — Telephone Encounter (Signed)
Called patient twice today (11:20 and 13:45) to check on how he's fairing and if he has been able to have a BM. Left VM with first call and provided my direct call back number for patient to call back with update. Also called patient's brother but did not get an answer.  ?

## 2021-12-19 NOTE — Telephone Encounter (Signed)
Patient called stating he is not feeling well and will not be able to make his treatment scheduled 5/10. L3 notified.  ?

## 2021-12-20 ENCOUNTER — Emergency Department (HOSPITAL_COMMUNITY): Payer: Medicare Other

## 2021-12-20 ENCOUNTER — Other Ambulatory Visit: Payer: Self-pay

## 2021-12-20 ENCOUNTER — Encounter (HOSPITAL_COMMUNITY): Payer: Self-pay

## 2021-12-20 ENCOUNTER — Ambulatory Visit: Payer: Medicare Other

## 2021-12-20 ENCOUNTER — Inpatient Hospital Stay (HOSPITAL_COMMUNITY)
Admission: EM | Admit: 2021-12-20 | Discharge: 2021-12-25 | DRG: 638 | Disposition: A | Discharge status: Skilled Nursing Facility | Payer: Medicare Other | Attending: Internal Medicine | Admitting: Internal Medicine

## 2021-12-20 DIAGNOSIS — Z888 Allergy status to other drugs, medicaments and biological substances status: Secondary | ICD-10-CM

## 2021-12-20 DIAGNOSIS — R110 Nausea: Secondary | ICD-10-CM

## 2021-12-20 DIAGNOSIS — R197 Diarrhea, unspecified: Secondary | ICD-10-CM | POA: Diagnosis present

## 2021-12-20 DIAGNOSIS — N179 Acute kidney failure, unspecified: Secondary | ICD-10-CM | POA: Diagnosis present

## 2021-12-20 DIAGNOSIS — E10649 Type 1 diabetes mellitus with hypoglycemia without coma: Principal | ICD-10-CM | POA: Diagnosis present

## 2021-12-20 DIAGNOSIS — R1313 Dysphagia, pharyngeal phase: Secondary | ICD-10-CM | POA: Diagnosis present

## 2021-12-20 DIAGNOSIS — E8809 Other disorders of plasma-protein metabolism, not elsewhere classified: Secondary | ICD-10-CM | POA: Diagnosis present

## 2021-12-20 DIAGNOSIS — E162 Hypoglycemia, unspecified: Secondary | ICD-10-CM | POA: Diagnosis present

## 2021-12-20 DIAGNOSIS — I10 Essential (primary) hypertension: Secondary | ICD-10-CM | POA: Diagnosis present

## 2021-12-20 DIAGNOSIS — H547 Unspecified visual loss: Secondary | ICD-10-CM | POA: Diagnosis present

## 2021-12-20 DIAGNOSIS — Z940 Kidney transplant status: Secondary | ICD-10-CM

## 2021-12-20 DIAGNOSIS — N184 Chronic kidney disease, stage 4 (severe): Secondary | ICD-10-CM | POA: Diagnosis present

## 2021-12-20 DIAGNOSIS — Z89411 Acquired absence of right great toe: Secondary | ICD-10-CM

## 2021-12-20 DIAGNOSIS — I70203 Unspecified atherosclerosis of native arteries of extremities, bilateral legs: Secondary | ICD-10-CM | POA: Diagnosis present

## 2021-12-20 DIAGNOSIS — Z87891 Personal history of nicotine dependence: Secondary | ICD-10-CM

## 2021-12-20 DIAGNOSIS — R531 Weakness: Principal | ICD-10-CM

## 2021-12-20 DIAGNOSIS — Z993 Dependence on wheelchair: Secondary | ICD-10-CM

## 2021-12-20 DIAGNOSIS — C090 Malignant neoplasm of tonsillar fossa: Secondary | ICD-10-CM

## 2021-12-20 DIAGNOSIS — Y830 Surgical operation with transplant of whole organ as the cause of abnormal reaction of the patient, or of later complication, without mention of misadventure at the time of the procedure: Secondary | ICD-10-CM | POA: Diagnosis present

## 2021-12-20 DIAGNOSIS — Z794 Long term (current) use of insulin: Secondary | ICD-10-CM

## 2021-12-20 DIAGNOSIS — R627 Adult failure to thrive: Secondary | ICD-10-CM | POA: Diagnosis present

## 2021-12-20 DIAGNOSIS — T8619 Other complication of kidney transplant: Secondary | ICD-10-CM | POA: Diagnosis present

## 2021-12-20 DIAGNOSIS — Z7952 Long term (current) use of systemic steroids: Secondary | ICD-10-CM

## 2021-12-20 DIAGNOSIS — Z8614 Personal history of Methicillin resistant Staphylococcus aureus infection: Secondary | ICD-10-CM

## 2021-12-20 DIAGNOSIS — L89151 Pressure ulcer of sacral region, stage 1: Secondary | ICD-10-CM | POA: Diagnosis present

## 2021-12-20 DIAGNOSIS — E1065 Type 1 diabetes mellitus with hyperglycemia: Secondary | ICD-10-CM | POA: Diagnosis present

## 2021-12-20 DIAGNOSIS — I129 Hypertensive chronic kidney disease with stage 1 through stage 4 chronic kidney disease, or unspecified chronic kidney disease: Secondary | ICD-10-CM | POA: Diagnosis present

## 2021-12-20 DIAGNOSIS — E876 Hypokalemia: Secondary | ICD-10-CM | POA: Diagnosis present

## 2021-12-20 DIAGNOSIS — I70209 Unspecified atherosclerosis of native arteries of extremities, unspecified extremity: Secondary | ICD-10-CM | POA: Diagnosis present

## 2021-12-20 DIAGNOSIS — E1022 Type 1 diabetes mellitus with diabetic chronic kidney disease: Secondary | ICD-10-CM | POA: Diagnosis present

## 2021-12-20 DIAGNOSIS — D84821 Immunodeficiency due to drugs: Secondary | ICD-10-CM | POA: Diagnosis present

## 2021-12-20 DIAGNOSIS — R339 Retention of urine, unspecified: Secondary | ICD-10-CM | POA: Diagnosis present

## 2021-12-20 DIAGNOSIS — Z6821 Body mass index (BMI) 21.0-21.9, adult: Secondary | ICD-10-CM

## 2021-12-20 DIAGNOSIS — C770 Secondary and unspecified malignant neoplasm of lymph nodes of head, face and neck: Secondary | ICD-10-CM | POA: Diagnosis present

## 2021-12-20 DIAGNOSIS — M549 Dorsalgia, unspecified: Secondary | ICD-10-CM | POA: Diagnosis present

## 2021-12-20 DIAGNOSIS — Z79899 Other long term (current) drug therapy: Secondary | ICD-10-CM

## 2021-12-20 DIAGNOSIS — Z931 Gastrostomy status: Secondary | ICD-10-CM

## 2021-12-20 DIAGNOSIS — D630 Anemia in neoplastic disease: Secondary | ICD-10-CM | POA: Diagnosis present

## 2021-12-20 DIAGNOSIS — G8929 Other chronic pain: Secondary | ICD-10-CM | POA: Diagnosis present

## 2021-12-20 DIAGNOSIS — L8990 Pressure ulcer of unspecified site, unspecified stage: Secondary | ICD-10-CM | POA: Insufficient documentation

## 2021-12-20 DIAGNOSIS — Z85818 Personal history of malignant neoplasm of other sites of lip, oral cavity, and pharynx: Secondary | ICD-10-CM

## 2021-12-20 DIAGNOSIS — Z89421 Acquired absence of other right toe(s): Secondary | ICD-10-CM

## 2021-12-20 DIAGNOSIS — E860 Dehydration: Secondary | ICD-10-CM | POA: Diagnosis not present

## 2021-12-20 DIAGNOSIS — Z89512 Acquired absence of left leg below knee: Secondary | ICD-10-CM

## 2021-12-20 DIAGNOSIS — C109 Malignant neoplasm of oropharynx, unspecified: Secondary | ICD-10-CM | POA: Diagnosis present

## 2021-12-20 DIAGNOSIS — E1051 Type 1 diabetes mellitus with diabetic peripheral angiopathy without gangrene: Secondary | ICD-10-CM | POA: Diagnosis present

## 2021-12-20 DIAGNOSIS — R600 Localized edema: Secondary | ICD-10-CM | POA: Diagnosis present

## 2021-12-20 LAB — CBC WITH DIFFERENTIAL/PLATELET
Abs Immature Granulocytes: 0.04 10*3/uL (ref 0.00–0.07)
Basophils Absolute: 0 10*3/uL (ref 0.0–0.1)
Basophils Relative: 0 %
Eosinophils Absolute: 0.2 10*3/uL (ref 0.0–0.5)
Eosinophils Relative: 2 %
HCT: 27.1 % — ABNORMAL LOW (ref 39.0–52.0)
Hemoglobin: 8.9 g/dL — ABNORMAL LOW (ref 13.0–17.0)
Immature Granulocytes: 0 %
Lymphocytes Relative: 21 %
Lymphs Abs: 2 10*3/uL (ref 0.7–4.0)
MCH: 28.8 pg (ref 26.0–34.0)
MCHC: 32.8 g/dL (ref 30.0–36.0)
MCV: 87.7 fL (ref 80.0–100.0)
Monocytes Absolute: 1.1 10*3/uL — ABNORMAL HIGH (ref 0.1–1.0)
Monocytes Relative: 11 %
Neutro Abs: 6.4 10*3/uL (ref 1.7–7.7)
Neutrophils Relative %: 66 %
Platelets: 320 10*3/uL (ref 150–400)
RBC: 3.09 MIL/uL — ABNORMAL LOW (ref 4.22–5.81)
RDW: 16.2 % — ABNORMAL HIGH (ref 11.5–15.5)
WBC: 9.9 10*3/uL (ref 4.0–10.5)
nRBC: 0 % (ref 0.0–0.2)

## 2021-12-20 LAB — CBG MONITORING, ED
Glucose-Capillary: 146 mg/dL — ABNORMAL HIGH (ref 70–99)
Glucose-Capillary: 47 mg/dL — ABNORMAL LOW (ref 70–99)
Glucose-Capillary: 122 mg/dL — ABNORMAL HIGH (ref 70–99)

## 2021-12-20 LAB — COMPREHENSIVE METABOLIC PANEL
ALT: 17 U/L (ref 0–44)
AST: 28 U/L (ref 15–41)
Albumin: 2.8 g/dL — ABNORMAL LOW (ref 3.5–5.0)
Alkaline Phosphatase: 110 U/L (ref 38–126)
Anion gap: 8 (ref 5–15)
BUN: 88 mg/dL — ABNORMAL HIGH (ref 6–20)
CO2: 27 mmol/L (ref 22–32)
Calcium: 8.7 mg/dL — ABNORMAL LOW (ref 8.9–10.3)
Chloride: 101 mmol/L (ref 98–111)
Creatinine, Ser: 2.37 mg/dL — ABNORMAL HIGH (ref 0.61–1.24)
GFR, Estimated: 32 mL/min — ABNORMAL LOW (ref >60)
Glucose, Bld: 56 mg/dL — ABNORMAL LOW (ref 70–99)
Potassium: 3.4 mmol/L — ABNORMAL LOW (ref 3.5–5.1)
Sodium: 136 mmol/L (ref 135–145)
Total Bilirubin: 1.2 mg/dL (ref 0.3–1.2)
Total Protein: 6.9 g/dL (ref 6.5–8.1)

## 2021-12-20 LAB — GLUCOSE, CAPILLARY: Glucose-Capillary: 101 mg/dL — ABNORMAL HIGH (ref 70–99)

## 2021-12-20 LAB — LIPASE, BLOOD: Lipase: 21 U/L (ref 11–51)

## 2021-12-20 MED ORDER — DEXTROSE 50 % IV SOLN
50.0000 mL | Freq: Once | INTRAVENOUS | Status: AC
  Administered 2021-12-20: 50 mL via INTRAVENOUS
  Filled 2021-12-20: qty 50

## 2021-12-20 MED ORDER — SODIUM CHLORIDE 0.9 % IV BOLUS
500.0000 mL | Freq: Once | INTRAVENOUS | Status: AC
  Administered 2021-12-20: 500 mL via INTRAVENOUS

## 2021-12-20 NOTE — ED Triage Notes (Signed)
Pt reports with diarrhea and weakness since Monday. Pt reports missing his Ca txs due to these issues.  ?

## 2021-12-20 NOTE — ED Provider Triage Note (Signed)
Emergency Medicine Provider Triage Evaluation Note ? ?Arthur Burke , a 56 y.o. male  was evaluated in triage.  Pt complains of weakness and dehydration.  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  He also has a history of kidney transplant.  He states that over the past week he has had diarrhea.  He states that he was originally constipated due to his pain medication and digitally disimpacted himself as well as taking laxatives and stool softeners.  He states that from Monday to yesterday he was having multiple large bouts of diarrhea.  He also feels dehydrated.  He receives feedings through the G-tube as well as his hydration.  Denies fevers, vomiting. ? ?Review of Systems  ?Positive: See above ?Negative:  ? ?Physical Exam  ?BP (!) 147/66 (BP Location: Right Arm)   Pulse 68   Temp 97.8 ?F (36.6 ?C) (Oral)   Resp 16   Ht '5\' 11"'$  (1.803 m)   Wt 68.8 kg   SpO2 98%   BMI 21.16 kg/m?  ?Gen:   Awake, ill-appearing ?Resp:  Normal effort  ?MSK:   Moves extremities without difficulty  ?Other:  Appears tired.  Right lower extremity swelling.  Cachectic appearance of his bilateral upper arms with temporal wasting.  He does have diffuse swelling under his mandible.  Abdomen is nontender. ? ?Medical Decision Making  ?Medically screening exam initiated at 7:24 PM.  Appropriate orders placed.  Arthur Burke was informed that the remainder of the evaluation will be completed by another provider, this initial triage assessment does not replace that evaluation, and the importance of remaining in the ED until their evaluation is complete. ? ? ?  ?Mickie Hillier, PA-C ?12/20/21 1928 ? ?

## 2021-12-20 NOTE — ED Provider Notes (Signed)
?[facility] DEPT ?Provider Note ? ? ?CSN: [id number] ?Arrival date & time: 12/20/21  1855 ? ?  ? ?History ? ?Chief Complaint  ?Patient presents with  ? Diarrhea  ? Weakness  ? ? ?Arthur Burke is a 56 y.o. male. ? ? ?Diarrhea ?Associated symptoms: no abdominal pain   ?Weakness ?Associated symptoms: diarrhea   ?Associated symptoms: no abdominal pain and no chest pain   ?Patient presents feeling bad.  Has had weakness of dehydration.  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  Also previous kidney transplant.  Over the last week has been feeling bad.  Decreased intake and feels dehydration.  Although he gets his fluids through his PEG tube.  Previously was taking orals but states now only through the PEG tube.  States he has more swelling in his right leg however.  Does have a history of edema there.  States he had been constipated.  States he disimpacted himself and took laxatives and now feels as if he is cleared that up.  Has now had more diarrhea that he attributed to the laxatives. ?  ?Past Medical History:  ?Diagnosis Date  ? Acute osteomyelitis of left calcaneus (HCC)   ? Cancer [facility])   ? Diabetes mellitus without complication ([place])   ? Diabetic foot infection ([place])   ? Hypertension   ? Infected ulcer of skin, with fat layer exposed ([place])   ? Lower limb ulcer, ankle, left, with necrosis of muscle ([place])   ? MRSA (methicillin resistant staph aureus) culture positive   ? Renal disorder   ? Vision impairment   ? ?Past Surgical History:  ?Procedure Laterality Date  ? AMPUTATION Left 08/23/2017  ? Procedure: AMPUTATION BELOW KNEE;  Surgeon: Newt Minion, MD;  Location: [facility];  Service: Orthopedics;  Laterality: Left;  ? APPLICATION OF WOUND VAC Left 08/23/2017  ? Procedure: APPLICATION OF WOUND VAC;  Surgeon: Newt Minion, MD;  Location: [facility];  Service: Orthopedics;  Laterality: Left;  ? IR GASTROSTOMY TUBE MOD SED  10/22/2021  ? IR IMAGING GUIDED PORT  INSERTION  10/22/2021  ? KIDNEY TRANSPLANT  2006  ? [facility]  ? SKIN GRAFT    ? L diabetic foot wound with debriedment and skin grafting  ? TOE AMPUTATION    ? R great and 2nd toe  ? ? ? ?Home Medications ?Prior to Admission medications   ?Medication Sig Start Date End Date Taking? Authorizing Provider  ?amLODipine (NORVASC) 10 MG tablet Place 1 tablet (10 mg total) into feeding tube daily. 11/01/21  Yes Orvis Brill, MD  ?atorvastatin (LIPITOR) 40 MG tablet Place 1 tablet (40 mg total) into feeding tube every evening. 10/30/21  Yes Demaio, Alexa, MD  ?carvedilol (COREG) 25 MG tablet Place 1 tablet (25 mg total) into feeding tube 2 (two) times daily. 10/30/21  Yes Demaio, Alexa, MD  ?cloNIDine (CATAPRES) 0.1 MG tablet Place 1 tablet (0.1 mg total) into feeding tube 2 (two) times daily. 10/30/21  Yes Demaio, Alexa, MD  ?HUMALOG KWIKPEN 100 UNIT/ML KwikPen Inject 6 Units into the skin 3 (three) times daily. ?Patient taking differently: Inject 0-6 Units into the skin 3 (three) times daily as needed (if blood sugar over 170). Sliding scale 10/30/21  Yes Demaio, Alexa, MD  ?insulin glargine (LANTUS) 100 unit/mL SOPN Inject 22 Units into the skin at bedtime. 10/30/21  Yes Demaio, Alexa, MD  ?lactulose (CHRONULAC) 10 GM/15ML solution Take 15 mLs (10 g total) by  mouth daily. ?Patient taking differently: Place 10 g into feeding tube daily as needed for moderate constipation. 11/01/21  Yes Orvis Brill, MD  ?lidocaine (XYLOCAINE) 2 % solution Patient: Mix 1part 2% viscous lidocaine, 1part H20. Swish & swallow 14m of diluted mixture, 327m before meals and at bedtime, up to QID ?Patient taking differently: 15 mLs every 6 (six) hours as needed for mouth pain. Patient: Mix 1part 2% viscous lidocaine, 1part H20. Swish & swallow 1081mf diluted mixture, 14m93mefore meals and at bedtime, up to QID 11/26/21  Yes SquiEppie Gibson  ?Multiple Vitamins-Minerals (VITRUM SENIOR) TABS Place 1 tablet into feeding tube daily. 10/30/21   Yes Demaio, Alexa, MD  ?mycophenolate (CELLCEPT) 200 MG/ML suspension Take 2.5 mLs (500 mg total) by mouth 2 (two) times daily. ?Patient taking differently: Take 250 mg by mouth 2 (two) times daily. 12/03/21  Yes SteeMadalyn Rob  ?Nutritional Supplements (FEEDING SUPPLEMENT, NEPRO CARB STEADY,) LIQD Change TF to Nepro Carb Steady or equivalent, 5 cartons daily, bolus feeding, via PEG with 30 mL water before and after each feeding. (Mix 1 TBSP salt into 300 mL water for free water flush.) Provides 2135 cal 95 g pro/>90% needs. ?Patient taking differently: Place 237 mLs into feeding tube See admin instructions. Change TF to Nepro Carb Steady or equivalent, 5 cartons daily, bolus feeding, via PEG with 30 mL water before and after each feeding. (Mix 1 TBSP salt into 300 mL water for free water flush.) Provides 2135 cal 95 g pro/>90% needs. - patient taking 1 bottle 3 times a day. 12/07/21  Yes SquiEppie Gibson  ?omeprazole (PRILOSEC) 40 MG capsule Take 1 capsule (40 mg total) daily. May open capsule for administration ?Patient taking differently: Take 40 mg by mouth daily. Per tube 11/26/21  Yes SquiEppie Gibson  ?ondansetron (ZOFRAN) 4 MG tablet Take 1 tablet (4 mg total) by mouth as needed for nausea or vomiting. ?Patient taking differently: Place 4 mg into feeding tube 2 (two) times daily. 12/04/21  Yes SteeMadalyn Rob  ?oxyCODONE (ROXICODONE) 15 MG immediate release tablet Place 1 tablet (15 mg total) into feeding tube every 4 (four) hours. 10/30/21  Yes Demaio, Alexa, MD  ?polyethylene glycol (MIRALAX / GLYCOLAX) 17 g packet Place 17 g into feeding tube daily as needed for mild constipation. 10/30/21  Yes Demaio, Alexa, MD  ?predniSONE (DELTASONE) 5 MG tablet Place 5 mg into feeding tube daily. 12/16/21  Yes [provider]  ?tacrolimus (PROGRAF) 1 MG capsule Take 1 mg by mouth 2 (two) times daily. Per tube   Yes [provider]  ?vitamin B-12 (CYANOCOBALAMIN) 500 MCG tablet Place 1 tablet (500 mcg  total) into feeding tube daily. 10/30/21  Yes Demaio, Alexa, MD  ?Calcium 600-200 MG-UNIT tablet Place 1 tablet into feeding tube daily. ?Patient not taking: Reported on 12/20/2021 10/30/21   DemaScarlett Presto  ?Continuous Blood Gluc Receiver (DEXCGoldonnaVIFredoniaroduct by Does not apply route once a week. 11/01/21   BonaOrvis Brill  ?Continuous Blood Gluc Sensor (DEXCOM G7 SENSOR) MISC 1 Product by Does not apply route once a week. Check every 10 days 11/01/21   BonaOrvis Brill  ?sodium zirconium cyclosilicate (LOKELMA) 10 g PACK packet Take 10 g by mouth daily. ?Patient not taking: Reported on 12/20/2021 12/04/21   SteeMadalyn Rob  ?tacrolimus (PROGRAF) oral suspension 1 mg/mL mixture Take 1 mL (1 mg) by mouth 2 (two) times daily. ?Patient not taking: Reported  on 12/20/2021 11/07/21   Orvis Brill, MD  ?   ? ?Allergies    ?Zolpidem   ? ?Review of Systems   ?Review of Systems  ?Constitutional:  Negative for appetite change.  ?Cardiovascular:  Negative for chest pain.  ?Gastrointestinal:  Positive for diarrhea. Negative for abdominal pain.  ?Musculoskeletal:  Negative for back pain.  ?Neurological:  Positive for weakness.  ? ?Physical Exam ?Updated Vital Signs ?BP (!) 148/64   Pulse 64   Temp 97.8 ?F (36.6 ?C) (Oral)   Resp 10   Ht '5\' 11"'$  (1.803 m)   Wt 68.8 kg   SpO2 96%   BMI 21.16 kg/m?  ?Physical Exam ?Vitals reviewed.  ?Constitutional:   ?   Appearance: He is ill-appearing. He is not toxic-appearing.  ?HENT:  ?   Head:  ?   Comments: Swelling of his neck. ?Eyes:  ?   Pupils: Pupils are equal, round, and reactive to light.  ?Cardiovascular:  ?   Rate and Rhythm: Regular rhythm.  ?Abdominal:  ?   Tenderness: There is no abdominal tenderness.  ?   Comments: PEG tube in upper abdomen.  ?Musculoskeletal:     ?   General: No tenderness.  ?   Right lower leg: Edema present.  ?   Comments: Edema of right lower extremity.  Previous left below the knee amputation.  Dialysis access to left  forearm.  ?Skin: ?   Coloration: Skin is pale.  ?Neurological:  ?   Mental Status: He is alert and oriented to person, place, and time.  ? ? ?ED Results / Procedures / Treatments   ?Labs ?(all labs ordered are listed,

## 2021-12-21 ENCOUNTER — Other Ambulatory Visit: Payer: Self-pay

## 2021-12-21 ENCOUNTER — Encounter (HOSPITAL_COMMUNITY): Payer: Self-pay | Admitting: Internal Medicine

## 2021-12-21 ENCOUNTER — Ambulatory Visit
Admission: RE | Admit: 2021-12-21 | Discharge: 2021-12-21 | Disposition: A | Discharge status: Home / Self Care | Payer: Medicare Other | Source: Ambulatory Visit | Attending: Radiation Oncology | Admitting: Radiation Oncology

## 2021-12-21 DIAGNOSIS — E860 Dehydration: Secondary | ICD-10-CM | POA: Diagnosis not present

## 2021-12-21 DIAGNOSIS — E162 Hypoglycemia, unspecified: Secondary | ICD-10-CM

## 2021-12-21 LAB — CBC WITH DIFFERENTIAL/PLATELET
Abs Immature Granulocytes: 0.09 10*3/uL — ABNORMAL HIGH (ref 0.00–0.07)
Basophils Absolute: 0 10*3/uL (ref 0.0–0.1)
Basophils Relative: 0 %
Eosinophils Absolute: 0.2 10*3/uL (ref 0.0–0.5)
Eosinophils Relative: 1 %
HCT: 28.1 % — ABNORMAL LOW (ref 39.0–52.0)
Hemoglobin: 9.2 g/dL — ABNORMAL LOW (ref 13.0–17.0)
Immature Granulocytes: 1 %
Lymphocytes Relative: 15 %
Lymphs Abs: 2.1 10*3/uL (ref 0.7–4.0)
MCH: 28.8 pg (ref 26.0–34.0)
MCHC: 32.7 g/dL (ref 30.0–36.0)
MCV: 88.1 fL (ref 80.0–100.0)
Monocytes Absolute: 1.4 10*3/uL — ABNORMAL HIGH (ref 0.1–1.0)
Monocytes Relative: 10 %
Neutro Abs: 10.3 10*3/uL — ABNORMAL HIGH (ref 1.7–7.7)
Neutrophils Relative %: 73 %
Platelets: 341 10*3/uL (ref 150–400)
RBC: 3.19 MIL/uL — ABNORMAL LOW (ref 4.22–5.81)
RDW: 16.3 % — ABNORMAL HIGH (ref 11.5–15.5)
WBC: 14.1 10*3/uL — ABNORMAL HIGH (ref 4.0–10.5)
nRBC: 0 % (ref 0.0–0.2)

## 2021-12-21 LAB — BASIC METABOLIC PANEL
Anion gap: 9 (ref 5–15)
Anion gap: 7 (ref 5–15)
Anion gap: 5 (ref 5–15)
BUN: 82 mg/dL — ABNORMAL HIGH (ref 6–20)
BUN: 87 mg/dL — ABNORMAL HIGH (ref 6–20)
BUN: 97 mg/dL — ABNORMAL HIGH (ref 6–20)
CO2: 27 mmol/L (ref 22–32)
CO2: 28 mmol/L (ref 22–32)
CO2: 28 mmol/L (ref 22–32)
Calcium: 8.4 mg/dL — ABNORMAL LOW (ref 8.9–10.3)
Calcium: 7.9 mg/dL — ABNORMAL LOW (ref 8.9–10.3)
Calcium: 8.1 mg/dL — ABNORMAL LOW (ref 8.9–10.3)
Chloride: 101 mmol/L (ref 98–111)
Chloride: 99 mmol/L (ref 98–111)
Chloride: 101 mmol/L (ref 98–111)
Creatinine, Ser: 2.07 mg/dL — ABNORMAL HIGH (ref 0.61–1.24)
Creatinine, Ser: 2.33 mg/dL — ABNORMAL HIGH (ref 0.61–1.24)
Creatinine, Ser: 2.17 mg/dL — ABNORMAL HIGH (ref 0.61–1.24)
GFR, Estimated: 37 mL/min — ABNORMAL LOW (ref >60)
GFR, Estimated: 32 mL/min — ABNORMAL LOW (ref >60)
GFR, Estimated: 35 mL/min — ABNORMAL LOW (ref >60)
Glucose, Bld: 87 mg/dL (ref 70–99)
Glucose, Bld: 470 mg/dL — ABNORMAL HIGH (ref 70–99)
Glucose, Bld: 423 mg/dL — ABNORMAL HIGH (ref 70–99)
Potassium: 3.8 mmol/L (ref 3.5–5.1)
Potassium: 3.8 mmol/L (ref 3.5–5.1)
Potassium: 3.5 mmol/L (ref 3.5–5.1)
Sodium: 137 mmol/L (ref 135–145)
Sodium: 134 mmol/L — ABNORMAL LOW (ref 135–145)
Sodium: 134 mmol/L — ABNORMAL LOW (ref 135–145)

## 2021-12-21 LAB — URINALYSIS, ROUTINE W REFLEX MICROSCOPIC
Bilirubin Urine: NEGATIVE
Glucose, UA: >=500 mg/dL — AB
Hgb urine dipstick: NEGATIVE
Ketones, ur: NEGATIVE mg/dL
Leukocytes,Ua: NEGATIVE
Nitrite: NEGATIVE
Protein, ur: 100 mg/dL — AB
Specific Gravity, Urine: 1.01 (ref 1.005–1.030)
pH: 5 (ref 5.0–8.0)

## 2021-12-21 LAB — RAD ONC ARIA SESSION SUMMARY
Course Elapsed Days: 32
Plan Fractions Treated to Date: 6
Plan Prescribed Dose Per Fraction: 2 Gy
Plan Total Fractions Prescribed: 20
Plan Total Prescribed Dose: 40 Gy
Reference Point Dosage Given to Date: 42 Gy
Reference Point Session Dosage Given: 2 Gy
Session Number: 21

## 2021-12-21 LAB — HEPATIC FUNCTION PANEL
ALT: 16 U/L (ref 0–44)
AST: 26 U/L (ref 15–41)
Albumin: 2.8 g/dL — ABNORMAL LOW (ref 3.5–5.0)
Alkaline Phosphatase: 109 U/L (ref 38–126)
Bilirubin, Direct: 0.3 mg/dL — ABNORMAL HIGH (ref 0.0–0.2)
Indirect Bilirubin: 0.7 mg/dL (ref 0.3–0.9)
Total Bilirubin: 1 mg/dL (ref 0.3–1.2)
Total Protein: 6.9 g/dL (ref 6.5–8.1)

## 2021-12-21 LAB — GLUCOSE, CAPILLARY
Glucose-Capillary: 46 mg/dL — ABNORMAL LOW (ref 70–99)
Glucose-Capillary: 70 mg/dL (ref 70–99)
Glucose-Capillary: 179 mg/dL — ABNORMAL HIGH (ref 70–99)
Glucose-Capillary: 215 mg/dL — ABNORMAL HIGH (ref 70–99)
Glucose-Capillary: 215 mg/dL — ABNORMAL HIGH (ref 70–99)
Glucose-Capillary: 295 mg/dL — ABNORMAL HIGH (ref 70–99)
Glucose-Capillary: 417 mg/dL — ABNORMAL HIGH (ref 70–99)
Glucose-Capillary: 443 mg/dL — ABNORMAL HIGH (ref 70–99)
Glucose-Capillary: 382 mg/dL — ABNORMAL HIGH (ref 70–99)

## 2021-12-21 LAB — SODIUM, URINE, RANDOM: Sodium, Ur: 11 mmol/L

## 2021-12-21 LAB — CREATININE, URINE, RANDOM: Creatinine, Urine: 86.73 mg/dL

## 2021-12-21 LAB — BETA-HYDROXYBUTYRIC ACID: Beta-Hydroxybutyric Acid: 0.35 mmol/L — ABNORMAL HIGH (ref 0.05–0.27)

## 2021-12-21 MED ORDER — PREDNISONE 5 MG PO TABS
5.0000 mg | ORAL_TABLET | Freq: Every day | ORAL | Status: DC
  Administered 2021-12-21 – 2021-12-25 (×5): 5 mg
  Filled 2021-12-21 (×5): qty 1

## 2021-12-21 MED ORDER — AMLODIPINE BESYLATE 10 MG PO TABS
10.0000 mg | ORAL_TABLET | Freq: Every day | ORAL | Status: DC
  Administered 2021-12-21 – 2021-12-25 (×5): 10 mg
  Filled 2021-12-21 (×5): qty 1

## 2021-12-21 MED ORDER — CARVEDILOL 25 MG PO TABS
25.0000 mg | ORAL_TABLET | Freq: Two times a day (BID) | ORAL | Status: DC
  Administered 2021-12-21 – 2021-12-25 (×9): 25 mg
  Filled 2021-12-21 (×9): qty 1

## 2021-12-21 MED ORDER — VITRUM SENIOR PO TABS: 1.0000 | ORAL_TABLET | Freq: Every day | ORAL | Status: DC

## 2021-12-21 MED ORDER — SENNOSIDES-DOCUSATE SODIUM 8.6-50 MG PO TABS
1.0000 | ORAL_TABLET | Freq: Two times a day (BID) | ORAL | Status: DC
  Administered 2021-12-21 – 2021-12-23 (×4): 1 via ORAL
  Filled 2021-12-21 (×4): qty 1

## 2021-12-21 MED ORDER — TACROLIMUS 1 MG PO CAPS: 1.0000 mg | ORAL_CAPSULE | Freq: Two times a day (BID) | ORAL | Status: DC

## 2021-12-21 MED ORDER — INSULIN GLARGINE-YFGN 100 UNIT/ML ~~LOC~~ SOLN
10.0000 [IU] | Freq: Every day | SUBCUTANEOUS | Status: DC
  Administered 2021-12-21: 10 [IU] via SUBCUTANEOUS
  Filled 2021-12-21 (×2): qty 0.1

## 2021-12-21 MED ORDER — DEXTROSE 50 % IV SOLN
1.0000 | Freq: Once | INTRAVENOUS | Status: AC
  Administered 2021-12-21: 50 mL via INTRAVENOUS
  Filled 2021-12-21: qty 50

## 2021-12-21 MED ORDER — TACROLIMUS 1 MG PO CAPS
1.0000 mg | ORAL_CAPSULE | Freq: Two times a day (BID) | ORAL | Status: DC
  Administered 2021-12-21 – 2021-12-25 (×9): 1 mg via ORAL
  Filled 2021-12-21 (×9): qty 1

## 2021-12-21 MED ORDER — TACROLIMUS 1 MG/ML ORAL SUSPENSION
1.0000 mg | Freq: Two times a day (BID) | ORAL | Status: DC
  Filled 2021-12-21: qty 1

## 2021-12-21 MED ORDER — ADULT MULTIVITAMIN LIQUID CH
15.0000 mL | Freq: Every day | ORAL | Status: DC
  Administered 2021-12-21 – 2021-12-22 (×2): 15 mL
  Filled 2021-12-21 (×2): qty 15

## 2021-12-21 MED ORDER — LIDOCAINE VISCOUS HCL 2 % MT SOLN
15.0000 mL | Freq: Four times a day (QID) | OROMUCOSAL | Status: DC | PRN
  Filled 2021-12-21: qty 15

## 2021-12-21 MED ORDER — INSULIN ASPART 100 UNIT/ML IJ SOLN: 12.0000 [IU] | Freq: Once | INTRAMUSCULAR | Status: DC

## 2021-12-21 MED ORDER — CLONIDINE HCL 0.1 MG PO TABS
0.1000 mg | ORAL_TABLET | Freq: Two times a day (BID) | ORAL | Status: DC
  Administered 2021-12-21 – 2021-12-25 (×9): 0.1 mg
  Filled 2021-12-21 (×9): qty 1

## 2021-12-21 MED ORDER — HYDROCORTISONE SOD SUC (PF) 100 MG IJ SOLR
50.0000 mg | Freq: Once | INTRAMUSCULAR | Status: AC
  Administered 2021-12-21: 50 mg via INTRAVENOUS
  Filled 2021-12-21: qty 2

## 2021-12-21 MED ORDER — INSULIN GLARGINE-YFGN 100 UNIT/ML ~~LOC~~ SOLN
12.0000 [IU] | Freq: Once | SUBCUTANEOUS | Status: AC
Start: 2021-12-21 — End: 2021-12-21
  Administered 2021-12-21: 12 [IU] via SUBCUTANEOUS
  Filled 2021-12-21 (×2): qty 0.12

## 2021-12-21 MED ORDER — ATORVASTATIN CALCIUM 40 MG PO TABS
40.0000 mg | ORAL_TABLET | Freq: Every evening | ORAL | Status: DC
  Administered 2021-12-21 – 2021-12-24 (×4): 40 mg
  Filled 2021-12-21 (×4): qty 1

## 2021-12-21 MED ORDER — NEPRO/CARBSTEADY PO LIQD
237.0000 mL | Freq: Every day | ORAL | Status: DC
  Administered 2021-12-21 – 2021-12-22 (×7): 237 mL
  Filled 2021-12-21 (×8): qty 237

## 2021-12-21 MED ORDER — OXYCODONE HCL 5 MG PO TABS
15.0000 mg | ORAL_TABLET | Freq: Four times a day (QID) | ORAL | Status: DC | PRN
  Administered 2021-12-21 – 2021-12-22 (×4): 15 mg
  Filled 2021-12-21 (×4): qty 3

## 2021-12-21 MED ORDER — CYANOCOBALAMIN 500 MCG PO TABS
500.0000 ug | ORAL_TABLET | Freq: Every day | ORAL | Status: DC
  Administered 2021-12-21 – 2021-12-25 (×5): 500 ug
  Filled 2021-12-21 (×6): qty 1

## 2021-12-21 MED ORDER — INSULIN ASPART 100 UNIT/ML IJ SOLN
12.0000 [IU] | Freq: Once | INTRAMUSCULAR | Status: AC
  Administered 2021-12-21: 12 [IU] via SUBCUTANEOUS

## 2021-12-21 MED ORDER — INSULIN ASPART 100 UNIT/ML IJ SOLN
0.0000 [IU] | INTRAMUSCULAR | Status: DC
  Administered 2021-12-21 – 2021-12-22 (×2): 9 [IU] via SUBCUTANEOUS
  Administered 2021-12-22 (×2): 2 [IU] via SUBCUTANEOUS
  Administered 2021-12-22: 3 [IU] via SUBCUTANEOUS
  Administered 2021-12-22: 1 [IU] via SUBCUTANEOUS
  Administered 2021-12-23 (×3): 2 [IU] via SUBCUTANEOUS
  Administered 2021-12-23: 3 [IU] via SUBCUTANEOUS
  Administered 2021-12-23: 2 [IU] via SUBCUTANEOUS
  Administered 2021-12-23: 3 [IU] via SUBCUTANEOUS
  Administered 2021-12-24: 2 [IU] via SUBCUTANEOUS
  Administered 2021-12-24: 3 [IU] via SUBCUTANEOUS
  Administered 2021-12-24: 7 [IU] via SUBCUTANEOUS
  Administered 2021-12-24: 3 [IU] via SUBCUTANEOUS
  Administered 2021-12-24: 5 [IU] via SUBCUTANEOUS
  Administered 2021-12-25: 3 [IU] via SUBCUTANEOUS
  Administered 2021-12-25: 1 [IU] via SUBCUTANEOUS
  Administered 2021-12-25: 3 [IU] via SUBCUTANEOUS
  Administered 2021-12-25: 9 [IU] via SUBCUTANEOUS

## 2021-12-21 MED ORDER — CHLORHEXIDINE GLUCONATE CLOTH 2 % EX PADS
6.0000 | MEDICATED_PAD | Freq: Every day | CUTANEOUS | Status: DC
  Administered 2021-12-21 – 2021-12-25 (×5): 6 via TOPICAL

## 2021-12-21 MED ORDER — MYCOPHENOLATE MOFETIL 200 MG/ML PO SUSR
250.0000 mg | Freq: Two times a day (BID) | ORAL | Status: DC
  Administered 2021-12-21 – 2021-12-25 (×8): 250 mg

## 2021-12-21 MED ORDER — HEPARIN SODIUM (PORCINE) 5000 UNIT/ML IJ SOLN
5000.0000 [IU] | Freq: Three times a day (TID) | INTRAMUSCULAR | Status: DC
  Administered 2021-12-21 – 2021-12-25 (×13): 5000 [IU] via SUBCUTANEOUS
  Filled 2021-12-21 (×13): qty 1

## 2021-12-21 MED ORDER — TAMSULOSIN HCL 0.4 MG PO CAPS
0.4000 mg | ORAL_CAPSULE | Freq: Every day | ORAL | Status: DC
  Administered 2021-12-21 – 2021-12-25 (×5): 0.4 mg via ORAL
  Filled 2021-12-21 (×5): qty 1

## 2021-12-21 MED ORDER — ONDANSETRON HCL 4 MG/2ML IJ SOLN
4.0000 mg | Freq: Four times a day (QID) | INTRAMUSCULAR | Status: DC | PRN
  Administered 2021-12-21: 4 mg via INTRAVENOUS
  Filled 2021-12-21: qty 2

## 2021-12-21 NOTE — Progress Notes (Signed)
Inpatient Diabetes Program Recommendations ? ?AACE/ADA: New Consensus Statement on Inpatient Glycemic Control (2015) ? ?Target Ranges:  Prepandial:   less than 140 mg/dL ?     Peak postprandial:   less than 180 mg/dL (1-2 hours) ?     Critically ill patients:  140 - 180 mg/dL  ? ?Lab Results  ?Component Value Date  ? GLUCAP 215 (H) 12/21/2021  ? HGBA1C 8.2 (H) 10/21/2021  ? ? ?Review of Glycemic Control ? Latest Reference Range & Units 12/21/21 04:21 12/21/21 05:15 12/21/21 06:52 12/21/21 08:33  ?Glucose-Capillary 70 - 99 mg/dL 46 (L) 70 179 (H) 215 (H)  ?(L): Data is abnormally low ?(H): Data is abnormally high ? ?Diabetes history: TYPE 1DM ?Outpatient Diabetes medications: Lantus 22 units QHS, Humalog 0-6 units TID if glucose > 170 mg/dl ?Current orders for Inpatient glycemic control: Prednisone 5 mg QD.   ? ?Inpatient Diabetes Program Recommendations:   ? ?Does not make insulin; having episodes of hypoglycemia.  Last Lantus administration was Wednesday night 22 units at home.   ? ?Please consider: ? ?-Semglee 7 units QD ?-Novolog 0-6 units TID ? ?Spoke with him at bedside.  Diagnosed with T1DM at age 56.  Has been approved for a Dexcom which is on the way.  Denies difficulty obtaining insulins.  Lives with brother who helps him with ADLs.  Feeds through peg tube.  Can eat some pudding or peaches when he gets low at home.   ? ?He states he has had more frequent lows in the 60's lately.   ? ?Will continue to follow while inpatient. ? ?Thank you, ?Reche Dixon, MSN, CDCES ?Diabetes Coordinator ?Inpatient Diabetes Program ?[phone] (team pager from 8a-5p) ? ? ? ? ? ?

## 2021-12-21 NOTE — Progress Notes (Signed)
Hypoglycemic Event ? ?CBG: 46 ? ?Treatment: 8 oz juice/soda, scheduled nepro ? ?Symptoms: None ? ?Follow-up CBG: [id number] CBG Result:70 ? ?Possible Reasons for Event: Unknown ? ?Comments/MD notified:Kakrakandy ? ?Provider also gave orders to give amp of D50 and steroid. ? ?Lonia Skinner ? ? ?

## 2021-12-21 NOTE — H&P (Addendum)
?History and Physical  ? ? ?Arthur Burke UDJ:[id number] DOB: [birth year] DOA: 12/20/2021 ? ?PCP: Pcp, No  ?Patient coming from: Home. ? ?Chief Complaint: Weak and dehydrated. ? ?HPI: Arthur Burke is a 56 y.o. 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  He has missed his radiation dose this week.  Patient has a PEG tube through which he gets most of his nutrition. ? ?ED Course: In the ER patient is found to be hypoglycemic with blood sugar in the 40s.  Labs also show progressively worsening creatinine.  Patient has a known history of renal transplant.  Patient was given D50 admitted for further observation for failure to thrive hypoglycemia and worsening swelling of his throat area. ? ?Review of Systems: As per HPI, rest all negative. ? ? ?Past Medical History:  ?Diagnosis Date  ? Acute osteomyelitis of left calcaneus (HCC)   ? Cancer [facility])   ? Diabetes mellitus without complication ([place])   ? Diabetic foot infection ([place])   ? Hypertension   ? Infected ulcer of skin, with fat layer exposed ([place])   ? Lower limb ulcer, ankle, left, with necrosis of muscle ([place])   ? MRSA (methicillin resistant staph aureus) culture positive   ? Renal disorder   ? Vision impairment   ? ? ?Past Surgical History:  ?Procedure Laterality Date  ? AMPUTATION Left 08/23/2017  ? Procedure: AMPUTATION BELOW KNEE;  Surgeon: Newt Minion, MD;  Location: [place];  Service: Orthopedics;  Laterality: Left;  ? APPLICATION OF WOUND VAC Left 08/23/2017  ? Procedure: APPLICATION OF WOUND VAC;  Surgeon: Newt Minion, MD;  Location: [place];  Service: Orthopedics;  Laterality: Left;  ? IR GASTROSTOMY TUBE MOD SED  10/22/2021  ? IR IMAGING GUIDED PORT INSERTION  10/22/2021  ? KIDNEY TRANSPLANT  2006  ? [facility]  ? SKIN  GRAFT    ? L diabetic foot wound with debriedment and skin grafting  ? TOE AMPUTATION    ? R great and 2nd toe  ? ? ? reports that he has quit smoking. His smoking use included cigarettes. He has never used smokeless tobacco. He reports that he does not drink alcohol and does not use drugs. ? ?Allergies  ?Allergen Reactions  ? Zolpidem Other (See Comments)  ?  Other Reaction: CNS Disorder  ? ? ?History reviewed. No pertinent family history. ? ?Prior to Admission medications   ?Medication Sig Start Date End Date Taking? Authorizing Provider  ?amLODipine (NORVASC) 10 MG tablet Place 1 tablet (10 mg total) into feeding tube daily. 11/01/21  Yes Orvis Brill, MD  ?atorvastatin (LIPITOR) 40 MG tablet Place 1 tablet (40 mg total) into feeding tube every evening. 10/30/21  Yes Demaio, Alexa, MD  ?carvedilol (COREG) 25 MG tablet Place 1 tablet (25 mg total) into feeding tube 2 (two) times daily. 10/30/21  Yes Demaio, Alexa, MD  ?cloNIDine (CATAPRES) 0.1 MG tablet Place 1 tablet (0.1 mg total) into feeding tube 2 (two) times daily. 10/30/21  Yes Demaio, Alexa, MD  ?HUMALOG KWIKPEN 100 UNIT/ML KwikPen Inject 6 Units into the skin 3 (three) times daily. ?Patient taking differently: Inject 0-6 Units into the skin 3 (three) times daily as needed (if blood sugar over 170). Sliding scale 10/30/21  Yes Demaio, Alexa, MD  ?insulin  glargine (LANTUS) 100 unit/mL SOPN Inject 22 Units into the skin at bedtime. 10/30/21  Yes Demaio, Alexa, MD  ?lactulose (CHRONULAC) 10 GM/15ML solution Take 15 mLs (10 g total) by mouth daily. ?Patient taking differently: Place 10 g into feeding tube daily as needed for moderate constipation. 11/01/21  Yes Orvis Brill, MD  ?lidocaine (XYLOCAINE) 2 % solution Patient: Mix 1part 2% viscous lidocaine, 1part H20. Swish & swallow 19m of diluted mixture, 321m before meals and at bedtime, up to QID ?Patient taking differently: 15 mLs every 6 (six) hours as needed for mouth pain. Patient: Mix 1part 2%  viscous lidocaine, 1part H20. Swish & swallow 1021mf diluted mixture, 50m58mefore meals and at bedtime, up to QID 11/26/21  Yes SquiEppie Gibson  ?Multiple Vitamins-Minerals (VITRUM SENIOR) TABS Place 1 tablet into feeding tube daily. 10/30/21  Yes Demaio, Alexa, MD  ?mycophenolate (CELLCEPT) 200 MG/ML suspension Take 2.5 mLs (500 mg total) by mouth 2 (two) times daily. ?Patient taking differently: Take 250 mg by mouth 2 (two) times daily. 12/03/21  Yes SteeMadalyn Rob  ?Nutritional Supplements (FEEDING SUPPLEMENT, NEPRO CARB STEADY,) LIQD Change TF to Nepro Carb Steady or equivalent, 5 cartons daily, bolus feeding, via PEG with 30 mL water before and after each feeding. (Mix 1 TBSP salt into 300 mL water for free water flush.) Provides 2135 cal 95 g pro/>90% needs. ?Patient taking differently: Place 237 mLs into feeding tube See admin instructions. Change TF to Nepro Carb Steady or equivalent, 5 cartons daily, bolus feeding, via PEG with 30 mL water before and after each feeding. (Mix 1 TBSP salt into 300 mL water for free water flush.) Provides 2135 cal 95 g pro/>90% needs. - patient taking 1 bottle 3 times a day. 12/07/21  Yes SquiEppie Gibson  ?omeprazole (PRILOSEC) 40 MG capsule Take 1 capsule (40 mg total) daily. May open capsule for administration ?Patient taking differently: Take 40 mg by mouth daily. Per tube 11/26/21  Yes SquiEppie Gibson  ?ondansetron (ZOFRAN) 4 MG tablet Take 1 tablet (4 mg total) by mouth as needed for nausea or vomiting. ?Patient taking differently: Place 4 mg into feeding tube 2 (two) times daily. 12/04/21  Yes SteeMadalyn Rob  ?oxyCODONE (ROXICODONE) 15 MG immediate release tablet Place 1 tablet (15 mg total) into feeding tube every 4 (four) hours. 10/30/21  Yes Demaio, Alexa, MD  ?polyethylene glycol (MIRALAX / GLYCOLAX) 17 g packet Place 17 g into feeding tube daily as needed for mild constipation. 10/30/21  Yes Demaio, Alexa, MD  ?predniSONE (DELTASONE) 5 MG tablet Place 5 mg into  feeding tube daily. 12/16/21  Yes [provider]  ?tacrolimus (PROGRAF) 1 MG capsule Take 1 mg by mouth 2 (two) times daily. Per tube   Yes [provider]  ?vitamin B-12 (CYANOCOBALAMIN) 500 MCG tablet Place 1 tablet (500 mcg total) into feeding tube daily. 10/30/21  Yes Demaio, Alexa, MD  ?Calcium 600-200 MG-UNIT tablet Place 1 tablet into feeding tube daily. ?Patient not taking: Reported on 12/20/2021 10/30/21   DemaScarlett Presto  ?Continuous Blood Gluc Receiver (DEXCBakerhillVICartersvilleroduct by Does not apply route once a week. 11/01/21   BonaOrvis Brill  ?Continuous Blood Gluc Sensor (DEXCOM G7 SENSOR) MISC 1 Product by Does not apply route once a week. Check every 10 days 11/01/21   BonaOrvis Brill  ?sodium zirconium cyclosilicate (LOKELMA) 10 g PACK packet Take 10 g by mouth daily. ?Patient not taking:  Reported on 12/20/2021 12/04/21   Madalyn Rob, MD  ?tacrolimus (PROGRAF) oral suspension 1 mg/mL mixture Take 1 mL (1 mg) by mouth 2 (two) times daily. ?Patient not taking: Reported on 12/20/2021 11/07/21   Orvis Brill, MD  ? ? ?Physical Exam: ?Constitutional: Moderately built and nourished. ?Vitals:  ? 12/20/21 2145 12/20/21 2200 12/20/21 2238 12/20/21 2341  ?BP: (!) 148/64 (!) 151/63 (!) 150/64 (!) 139/49  ?Pulse: 64 64 67 68  ?Resp: '10  12 18  '$ ?Temp:   97.7 ?F (36.5 ?C) 98.4 ?F (36.9 ?C)  ?TempSrc:   Oral Oral  ?SpO2: 96% 92% 96% 99%  ?Weight:      ?Height:      ? ?Eyes: Anicteric no pallor. ?ENMT: No discharge from ears eyes nose and mouth. ?Neck: No mass felt.  No neck rigidity.  Swelling noticed. ?Respiratory: No rhonchi or crepitations. ?Cardiovascular: S1-S2 heard. ?Abdomen: Soft nontender bowel sounds present.  PEG tube seen. ?Musculoskeletal: Edema present.  Left BKA. ?Skin: No rash. ?Neurologic: Alert awake oriented time place and person.  Moves all extremities. ?Psychiatric: Appears normal.  Normal affect. ? ? ?Labs on Admission: I have personally reviewed  following labs and imaging studies ? ?CBC: ?Recent Labs  ?Lab 12/16/21 ?1235 12/20/21 ?1929  ?WBC 10.4 9.9  ?NEUTROABS 8.0* 6.4  ?HGB 8.2* 8.9*  ?HCT 26.9* 27.1*  ?MCV 92.4 87.7  ?PLT 250 320  ? ?Basic Metabol

## 2021-12-21 NOTE — Progress Notes (Addendum)
Patient is admitted early this morning, detail please see HPI ?He appears weak, vital signs are stable ?Report not getting enough tube feeds due to it make him feeling bloated ?Start to have right lower extremity pitting edema and left bka stump edema, not able to put on prosthesis, previously able to function independently at home, feeding his cats and taking out trash, appear to have acute decline since after started radiation therapy. ?He has ESRD secondary to type 1 DM s/p renal transplant 2005 [facility], has progressive renal failure, nephrology on board ?Found to have acute urinary retention, bladder scan >422, foley insertion , start flomax ?

## 2021-12-21 NOTE — TOC Progression Note (Addendum)
Transition of Care (TOC) - Progression Note  ? ? ?Patient Details  ?Name: Arthur Burke ?MRN: [id number] ?Date of Birth: Dec 31, 1965 ? ?Transition of Care (TOC) CM/SW Contact  ?Purcell Mouton, RN ?Phone Number: ?12/21/2021, 3:52 PM ? ?Clinical Narrative:    ?Spoke with pt's sister in law and brother via telephone concerning discharge plans. Both sister in law and brother states, " the doctor told us to bring him to the hospital to continue his treatment. The hospital will find him a place to go." Explained to brother that pt was admitted as Observation, pt will need 3 qualifying midnight stays for Medicare to pay for a place for pt to go. Brother states,"He is alone, blind, weak and falling at home. He will need someone 24 hrs/day to stay with him." Explained again Medicare that pt was admitted as Observation and this did not count towards the 3 midnight stays that pt will need. Medicare will not pay for SNF. Brother states," Please do whatever you can for him." ? ? ?Expected Discharge Plan: [place] ?Barriers to Discharge: No Barriers Identified ? ?Expected Discharge Plan and Services ?Expected Discharge Plan: [place] ?  ?  ?Post Acute Care Choice: Home Health ?Living arrangements for the past 2 months: Apartment ?                ?  ?  ?  ?  ?  ?  ?  ?  ?  ?  ? ? ?Social Determinants of Health (SDOH) Interventions ?  ? ?Readmission Risk Interventions ?   ? View : No data to display.  ?  ?  ?  ? ? ?

## 2021-12-21 NOTE — Consult Note (Addendum)
[facility]  ?INPATIENT CONSULTATION ? ?Reason for Consultation: AKI in pt with kidney transplant ?Requesting Provider: Dr. Erlinda Hong ? ?HPI: Arthur Burke is an 56 y.o. 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  ? ?Recently dx with squamous cell ca of throat undergoing XRT.  Has had increased swelling of throat this week and took furosemide without improvement.  Also had diarrhea after having constipation and taking laxatives.  He didn't make XRT this week due to the diarrhea and weakness.  Nutrition via PEG.  Came into ED yesterday with weakness, hypoglycemic to 40s.   Rec'd D50 0.5L bolus.   This AM feeling ok.  ? ?Cr of late in the 2.2-2.'5mg'$ /dL over the past month.  Was 2.4 yesterday, 2.07 this AM.  UOP 700 yesterday.  No difficulty voiding, no dysuria.   ? ?PMH: ?Past Medical History:  ?Diagnosis Date  ? Acute osteomyelitis of left calcaneus (HCC)   ? Cancer [facility])   ? Diabetes mellitus without complication ([place])   ? Diabetic foot infection ([place])   ? Hypertension   ? Infected ulcer of skin, with fat layer exposed ([place])   ? Lower limb ulcer, ankle, left, with necrosis of muscle ([place])   ? MRSA (methicillin resistant staph aureus) culture positive   ? Renal disorder   ? Vision impairment   ? ?PSH: ?Past Surgical History:  ?Procedure Laterality Date  ? AMPUTATION Left 08/23/2017  ? Procedure: AMPUTATION BELOW KNEE;  Surgeon: Newt Minion, MD;  Location: [place];  Service: Orthopedics;  Laterality: Left;  ? APPLICATION OF WOUND VAC Left 08/23/2017  ? Procedure: APPLICATION OF WOUND VAC;  Surgeon: Newt Minion, MD;  Location: [place];  Service: Orthopedics;  Laterality: Left;  ? IR GASTROSTOMY TUBE MOD SED  10/22/2021  ? IR IMAGING GUIDED PORT INSERTION  10/22/2021  ? KIDNEY TRANSPLANT  2006  ? [facility]  ? SKIN GRAFT    ? L diabetic foot wound with  debriedment and skin grafting  ? TOE AMPUTATION    ? R great and 2nd toe  ? ? ?Past Medical History:  ?Diagnosis Date  ? Acute osteomyelitis of left calcaneus (HCC)   ? Cancer [facility])   ? Diabetes mellitus without complication ([place])   ? Diabetic foot infection ([place])   ? Hypertension   ? Infected ulcer of skin, with fat layer exposed ([place])   ? Lower limb ulcer, ankle, left, with necrosis of muscle ([place])   ? MRSA (methicillin resistant staph aureus) culture positive   ? Renal disorder   ? Vision impairment   ? ? ?Medications: ? I have reviewed the patient's current medications. ? ?Medications Prior to Admission  ?Medication Sig Dispense Refill  ? amLODipine (NORVASC) 10 MG tablet Place 1 tablet (10 mg total) into feeding tube daily. 30 tablet 0  ? atorvastatin (LIPITOR) 40 MG tablet Place 1 tablet (40 mg total) into feeding tube every evening. 30 tablet 0  ? carvedilol (COREG) 25 MG tablet Place 1 tablet (25 mg total) into feeding tube 2 (two) times daily. 60 tablet 0  ? cloNIDine (CATAPRES) 0.1 MG tablet Place 1 tablet (0.1 mg total) into feeding tube 2 (two) times daily. 60 tablet 11  ? HUMALOG KWIKPEN 100 UNIT/ML KwikPen Inject 6 Units into the skin 3 (three) times daily. (Patient taking  differently: Inject 0-6 Units into the skin 3 (three) times daily as needed (if blood sugar over 170). Sliding scale) 15 mL 11  ? insulin glargine (LANTUS) 100 unit/mL SOPN Inject 22 Units into the skin at bedtime. 15 mL 11  ? lactulose (CHRONULAC) 10 GM/15ML solution Take 15 mLs (10 g total) by mouth daily. (Patient taking differently: Place 10 g into feeding tube daily as needed for moderate constipation.) 236 mL 3  ? lidocaine (XYLOCAINE) 2 % solution Patient: Mix 1part 2% viscous lidocaine, 1part H20. Swish & swallow 18m of diluted mixture, 379m before meals and at bedtime, up to QID (Patient taking differently: 15 mLs every 6 (six) hours as needed for mouth pain. Patient: Mix 1part 2% viscous lidocaine, 1part H20. Swish &  swallow 1047mf diluted mixture, 63m88mefore meals and at bedtime, up to QID) 200 mL 3  ? Multiple Vitamins-Minerals (VITRUM SENIOR) TABS Place 1 tablet into feeding tube daily. 30 tablet 0  ? mycophenolate (CELLCEPT) 200 MG/ML suspension Take 2.5 mLs (500 mg total) by mouth 2 (two) times daily. (Patient taking differently: Take 250 mg by mouth 2 (two) times daily.) 160 mL 1  ? Nutritional Supplements (FEEDING SUPPLEMENT, NEPRO CARB STEADY,) LIQD Change TF to Nepro Carb Steady or equivalent, 5 cartons daily, bolus feeding, via PEG with 30 mL water before and after each feeding. (Mix 1 TBSP salt into 300 mL water for free water flush.) Provides 2135 cal 95 g pro/>90% needs. (Patient taking differently: Place 237 mLs into feeding tube See admin instructions. Change TF to Nepro Carb Steady or equivalent, 5 cartons daily, bolus feeding, via PEG with 30 mL water before and after each feeding. (Mix 1 TBSP salt into 300 mL water for free water flush.) Provides 2135 cal 95 g pro/>90% needs. - patient taking 1 bottle 3 times a day.) 1185 mL 6  ? omeprazole (PRILOSEC) 40 MG capsule Take 1 capsule (40 mg total) daily. May open capsule for administration (Patient taking differently: Take 40 mg by mouth daily. Per tube) 30 capsule 0  ? ondansetron (ZOFRAN) 4 MG tablet Take 1 tablet (4 mg total) by mouth as needed for nausea or vomiting. (Patient taking differently: Place 4 mg into feeding tube 2 (two) times daily.) 30 tablet 0  ? oxyCODONE (ROXICODONE) 15 MG immediate release tablet Place 1 tablet (15 mg total) into feeding tube every 4 (four) hours. 30 tablet 0  ? polyethylene glycol (MIRALAX / GLYCOLAX) 17 g packet Place 17 g into feeding tube daily as needed for mild constipation. 14 each 0  ? predniSONE (DELTASONE) 5 MG tablet Place 5 mg into feeding tube daily.    ? tacrolimus (PROGRAF) 1 MG capsule Take 1 mg by mouth 2 (two) times daily. Per tube    ? vitamin B-12 (CYANOCOBALAMIN) 500 MCG tablet Place 1 tablet (500 mcg  total) into feeding tube daily. 30 tablet 0  ? Calcium 600-200 MG-UNIT tablet Place 1 tablet into feeding tube daily. (Patient not taking: Reported on 12/20/2021) 30 tablet 0  ? Continuous Blood Gluc Receiver (DEXCOld [place] 1 Product by Does not apply route once a week. 1 each 0  ? Continuous Blood Gluc Sensor (DEXCOM G7 SENSOR) MISC 1 Product by Does not apply route once a week. Check every 10 days 3 each 2  ? sodium zirconium cyclosilicate (LOKELMA) 10 g PACK packet Take 10 g by mouth daily. (Patient not taking: Reported on 12/20/2021) 3 packet 0  ? tacrolimus (PROGRAF) oral  suspension 1 mg/mL mixture Take 1 mL (1 mg) by mouth 2 (two) times daily. (Patient not taking: Reported on 12/20/2021) 120 mL 1  ? ? ?ALLERGIES: ?  ?Allergies  ?Allergen Reactions  ? Zolpidem Other (See Comments)  ?  Other Reaction: CNS Disorder  ? ? ?FAM HX: ?History reviewed. No pertinent family history. ? ?Social History:  ? reports that he has quit smoking. His smoking use included cigarettes. He has never used smokeless tobacco. He reports that he does not drink alcohol and does not use drugs. ? ?ROS: 12 system ROS neg except HPI above ? ?Blood pressure (!) 146/68, pulse 83, temperature 100.3 ?F (37.9 ?C), temperature source Oral, resp. rate 20, height '5\' 11"'$  (1.803 m), weight 68.8 kg, SpO2 98 %. ?PHYSICAL EXAM: ?Gen: chronically ill but nontoxic  ?Eyes: anicteric, EOMI ?ENT: poor dentition, MMM, suctioning secretions ?Neck: swelling noted, able to speak ?CV: RRR ?Abd: soft, nontender ?Lungs: clear ?GU: no foley, LLQ transplanted kidney nontender ?Extr: L BKA, R ankle 2+ edema to mid tibia ?Neuro: AOx3 fully conversant ?  ?Results for orders placed or performed during the hospital encounter of 12/20/21 (from the past 48 hour(s))  ?Comprehensive metabolic panel     Status: Abnormal  ? Collection Time: 12/20/21  7:29 PM  ?Result Value Ref Range  ? Sodium 136 135 - 145 mmol/L  ? Potassium 3.4 (L) 3.5 - 5.1 mmol/L  ? Chloride 101 98 -  111 mmol/L  ? CO2 27 22 - 32 mmol/L  ? Glucose, Bld 56 (L) 70 - 99 mg/dL  ?  Comment: Glucose reference range applies only to samples taken after fasting for at least 8 hours.  ? BUN 88 (H) 6 - 20 mg/dL  ? Creatini

## 2021-12-22 ENCOUNTER — Inpatient Hospital Stay (HOSPITAL_COMMUNITY): Payer: Medicare Other

## 2021-12-22 DIAGNOSIS — D84821 Immunodeficiency due to drugs: Secondary | ICD-10-CM | POA: Diagnosis present

## 2021-12-22 DIAGNOSIS — R600 Localized edema: Secondary | ICD-10-CM | POA: Diagnosis present

## 2021-12-22 DIAGNOSIS — L8990 Pressure ulcer of unspecified site, unspecified stage: Secondary | ICD-10-CM | POA: Insufficient documentation

## 2021-12-22 DIAGNOSIS — R609 Edema, unspecified: Secondary | ICD-10-CM

## 2021-12-22 DIAGNOSIS — C109 Malignant neoplasm of oropharynx, unspecified: Secondary | ICD-10-CM | POA: Diagnosis present

## 2021-12-22 DIAGNOSIS — E1065 Type 1 diabetes mellitus with hyperglycemia: Secondary | ICD-10-CM | POA: Diagnosis present

## 2021-12-22 DIAGNOSIS — N184 Chronic kidney disease, stage 4 (severe): Secondary | ICD-10-CM | POA: Diagnosis present

## 2021-12-22 DIAGNOSIS — M79604 Pain in right leg: Secondary | ICD-10-CM

## 2021-12-22 DIAGNOSIS — E162 Hypoglycemia, unspecified: Secondary | ICD-10-CM | POA: Diagnosis not present

## 2021-12-22 DIAGNOSIS — Z940 Kidney transplant status: Secondary | ICD-10-CM | POA: Diagnosis not present

## 2021-12-22 DIAGNOSIS — E10649 Type 1 diabetes mellitus with hypoglycemia without coma: Secondary | ICD-10-CM | POA: Diagnosis present

## 2021-12-22 DIAGNOSIS — D630 Anemia in neoplastic disease: Secondary | ICD-10-CM | POA: Diagnosis present

## 2021-12-22 DIAGNOSIS — I70203 Unspecified atherosclerosis of native arteries of extremities, bilateral legs: Secondary | ICD-10-CM | POA: Diagnosis present

## 2021-12-22 DIAGNOSIS — E8809 Other disorders of plasma-protein metabolism, not elsewhere classified: Secondary | ICD-10-CM | POA: Diagnosis present

## 2021-12-22 DIAGNOSIS — R627 Adult failure to thrive: Secondary | ICD-10-CM | POA: Diagnosis present

## 2021-12-22 DIAGNOSIS — R339 Retention of urine, unspecified: Secondary | ICD-10-CM | POA: Diagnosis present

## 2021-12-22 DIAGNOSIS — R197 Diarrhea, unspecified: Secondary | ICD-10-CM | POA: Diagnosis present

## 2021-12-22 DIAGNOSIS — E1051 Type 1 diabetes mellitus with diabetic peripheral angiopathy without gangrene: Secondary | ICD-10-CM | POA: Diagnosis present

## 2021-12-22 DIAGNOSIS — L89151 Pressure ulcer of sacral region, stage 1: Secondary | ICD-10-CM | POA: Diagnosis present

## 2021-12-22 DIAGNOSIS — H547 Unspecified visual loss: Secondary | ICD-10-CM | POA: Diagnosis present

## 2021-12-22 DIAGNOSIS — E1022 Type 1 diabetes mellitus with diabetic chronic kidney disease: Secondary | ICD-10-CM | POA: Diagnosis present

## 2021-12-22 DIAGNOSIS — N179 Acute kidney failure, unspecified: Secondary | ICD-10-CM | POA: Diagnosis present

## 2021-12-22 DIAGNOSIS — Y830 Surgical operation with transplant of whole organ as the cause of abnormal reaction of the patient, or of later complication, without mention of misadventure at the time of the procedure: Secondary | ICD-10-CM | POA: Diagnosis present

## 2021-12-22 DIAGNOSIS — T8619 Other complication of kidney transplant: Secondary | ICD-10-CM | POA: Diagnosis present

## 2021-12-22 DIAGNOSIS — C090 Malignant neoplasm of tonsillar fossa: Secondary | ICD-10-CM | POA: Diagnosis not present

## 2021-12-22 DIAGNOSIS — C770 Secondary and unspecified malignant neoplasm of lymph nodes of head, face and neck: Secondary | ICD-10-CM | POA: Diagnosis present

## 2021-12-22 DIAGNOSIS — E876 Hypokalemia: Secondary | ICD-10-CM | POA: Diagnosis present

## 2021-12-22 DIAGNOSIS — Z6821 Body mass index (BMI) 21.0-21.9, adult: Secondary | ICD-10-CM | POA: Diagnosis not present

## 2021-12-22 DIAGNOSIS — I129 Hypertensive chronic kidney disease with stage 1 through stage 4 chronic kidney disease, or unspecified chronic kidney disease: Secondary | ICD-10-CM | POA: Diagnosis present

## 2021-12-22 DIAGNOSIS — E860 Dehydration: Secondary | ICD-10-CM | POA: Diagnosis present

## 2021-12-22 LAB — RENAL FUNCTION PANEL
Albumin: 2.4 g/dL — ABNORMAL LOW (ref 3.5–5.0)
Anion gap: 7 (ref 5–15)
BUN: 89 mg/dL — ABNORMAL HIGH (ref 6–20)
CO2: 28 mmol/L (ref 22–32)
Calcium: 8.3 mg/dL — ABNORMAL LOW (ref 8.9–10.3)
Chloride: 100 mmol/L (ref 98–111)
Creatinine, Ser: 2.19 mg/dL — ABNORMAL HIGH (ref 0.61–1.24)
GFR, Estimated: 35 mL/min — ABNORMAL LOW (ref >60)
Glucose, Bld: 270 mg/dL — ABNORMAL HIGH (ref 70–99)
Phosphorus: 1.9 mg/dL — ABNORMAL LOW (ref 2.5–4.6)
Potassium: 3.4 mmol/L — ABNORMAL LOW (ref 3.5–5.1)
Sodium: 135 mmol/L (ref 135–145)

## 2021-12-22 LAB — CBC WITH DIFFERENTIAL/PLATELET
Abs Immature Granulocytes: 0.05 10*3/uL (ref 0.00–0.07)
Basophils Absolute: 0 10*3/uL (ref 0.0–0.1)
Basophils Relative: 0 %
Eosinophils Absolute: 0 10*3/uL (ref 0.0–0.5)
Eosinophils Relative: 0 %
HCT: 24.6 % — ABNORMAL LOW (ref 39.0–52.0)
Hemoglobin: 8 g/dL — ABNORMAL LOW (ref 13.0–17.0)
Immature Granulocytes: 1 %
Lymphocytes Relative: 20 %
Lymphs Abs: 2.1 10*3/uL (ref 0.7–4.0)
MCH: 28.7 pg (ref 26.0–34.0)
MCHC: 32.5 g/dL (ref 30.0–36.0)
MCV: 88.2 fL (ref 80.0–100.0)
Monocytes Absolute: 1.1 10*3/uL — ABNORMAL HIGH (ref 0.1–1.0)
Monocytes Relative: 11 %
Neutro Abs: 7.1 10*3/uL (ref 1.7–7.7)
Neutrophils Relative %: 68 %
Platelets: 281 10*3/uL (ref 150–400)
RBC: 2.79 MIL/uL — ABNORMAL LOW (ref 4.22–5.81)
RDW: 16.3 % — ABNORMAL HIGH (ref 11.5–15.5)
WBC: 10.4 10*3/uL (ref 4.0–10.5)
nRBC: 0 % (ref 0.0–0.2)

## 2021-12-22 LAB — GLUCOSE, CAPILLARY
Glucose-Capillary: 115 mg/dL — ABNORMAL HIGH (ref 70–99)
Glucose-Capillary: 129 mg/dL — ABNORMAL HIGH (ref 70–99)
Glucose-Capillary: 158 mg/dL — ABNORMAL HIGH (ref 70–99)
Glucose-Capillary: 165 mg/dL — ABNORMAL HIGH (ref 70–99)
Glucose-Capillary: 219 mg/dL — ABNORMAL HIGH (ref 70–99)
Glucose-Capillary: 389 mg/dL — ABNORMAL HIGH (ref 70–99)

## 2021-12-22 MED ORDER — HYDROMORPHONE HCL 1 MG/ML IJ SOLN
0.5000 mg | Freq: Once | INTRAMUSCULAR | Status: AC
  Administered 2021-12-22: 0.5 mg via INTRAVENOUS
  Filled 2021-12-22: qty 0.5

## 2021-12-22 MED ORDER — K PHOS MONO-SOD PHOS DI & MONO 155-852-130 MG PO TABS
500.0000 mg | ORAL_TABLET | Freq: Two times a day (BID) | ORAL | Status: AC
  Administered 2021-12-22 (×2): 500 mg
  Filled 2021-12-22 (×2): qty 2

## 2021-12-22 MED ORDER — RENA-VITE PO TABS
1.0000 | ORAL_TABLET | Freq: Every day | ORAL | Status: DC
  Administered 2021-12-23 – 2021-12-24 (×2): 1 via ORAL
  Filled 2021-12-22 (×2): qty 1

## 2021-12-22 MED ORDER — K PHOS MONO-SOD PHOS DI & MONO 155-852-130 MG PO TABS: 500.0000 mg | ORAL_TABLET | Freq: Two times a day (BID) | ORAL | Status: DC

## 2021-12-22 MED ORDER — INSULIN ASPART 100 UNIT/ML IJ SOLN
6.0000 [IU] | Freq: Three times a day (TID) | INTRAMUSCULAR | Status: DC
  Administered 2021-12-22: 6 [IU] via SUBCUTANEOUS

## 2021-12-22 MED ORDER — INSULIN ASPART 100 UNIT/ML IJ SOLN
3.0000 [IU] | Freq: Three times a day (TID) | INTRAMUSCULAR | Status: AC
  Administered 2021-12-22 (×2): 3 [IU] via SUBCUTANEOUS

## 2021-12-22 MED ORDER — INSULIN GLARGINE-YFGN 100 UNIT/ML ~~LOC~~ SOLN
15.0000 [IU] | Freq: Every day | SUBCUTANEOUS | Status: DC
  Administered 2021-12-22 – 2021-12-25 (×4): 15 [IU] via SUBCUTANEOUS
  Filled 2021-12-22 (×4): qty 0.15

## 2021-12-22 MED ORDER — FREE WATER
60.0000 mL | Freq: Every day | Status: DC
  Administered 2021-12-22 – 2021-12-25 (×17): 60 mL

## 2021-12-22 MED ORDER — NEPRO/CARBSTEADY PO LIQD
237.0000 mL | Freq: Every day | ORAL | Status: DC
  Administered 2021-12-22 – 2021-12-25 (×16): 237 mL
  Filled 2021-12-22 (×17): qty 237

## 2021-12-22 MED ORDER — OXYCODONE HCL 5 MG PO TABS
15.0000 mg | ORAL_TABLET | ORAL | Status: DC | PRN
  Administered 2021-12-22 – 2021-12-25 (×17): 15 mg
  Filled 2021-12-22 (×17): qty 3

## 2021-12-22 MED ORDER — POLYETHYLENE GLYCOL 3350 17 G PO PACK
17.0000 g | PACK | Freq: Every day | ORAL | Status: DC
  Administered 2021-12-22 – 2021-12-23 (×2): 17 g via ORAL
  Filled 2021-12-22 (×2): qty 1

## 2021-12-22 MED ORDER — ALBUMIN HUMAN 25 % IV SOLN
25.0000 g | Freq: Every day | INTRAVENOUS | Status: DC
Start: 2021-12-22 — End: 2021-12-23
  Administered 2021-12-22 – 2021-12-23 (×2): 25 g via INTRAVENOUS
  Filled 2021-12-22 (×2): qty 100

## 2021-12-22 MED ORDER — FUROSEMIDE 10 MG/ML IJ SOLN
40.0000 mg | Freq: Every day | INTRAMUSCULAR | Status: DC
  Administered 2021-12-22 – 2021-12-23 (×2): 40 mg via INTRAVENOUS
  Filled 2021-12-22 (×2): qty 4

## 2021-12-22 NOTE — Progress Notes (Signed)
Initial Nutrition Assessment ? ?DOCUMENTATION CODES:  ? ?Not applicable, suspect malnutrition but unable to confirm at this time ? ?INTERVENTION:  ? ?Bolus tube feeding regimen via PEG: ?- Goal of 1 carton (237 ml) Nepro Carb Steady formula 5 times daily via PEG ?- Flush PEG with 30 ml of free water before and after each bolus feed ? ?Full bolus tube feeding regimen provides 2133 kcal, 96 grams of protein, and 861 ml of H2O. ? ?Total free water with flushes: 1161 ml ? ?If pt able to tolerate the above regimen, recommend increasing free water flushes to 60 ml before and after each bolus feed for a total of 1461 ml free water daily. ? ? ?If pt unable to tolerate the above tube feeding regimen and/or remains unable to reach goal of 5 cartons daily, recommend changing tube feeding formula back to Osmolite 1.5. Recommend: ?- Goal of 1.5 cartons (355 ml) Osmolite 1.5 formula 4 times daily via PEG (total of 6 cartons daily) ?- Flush PEG with 30 ml of free water before and after each bolus feed ? ?Full bolus tube feeding regimen would provide 2130 kcal, 89 grams of protein, and 1086 ml of H2O.  ? ?Total free water with flushes: 1326 ml ? ?- Change liquid MVI with renal MVI daily per tube ? ?NUTRITION DIAGNOSIS:  ? ?Inadequate oral intake related to inability to eat as evidenced by NPO status. ? ?GOAL:  ? ?Patient will meet greater than or equal to 90% of their needs ? ?MONITOR:  ? ?Diet advancement, Labs, Weight trends, TF tolerance, Skin, I & O's ? ?REASON FOR ASSESSMENT:  ? ?Consult ?Enteral/tube feeding initiation and management ? ?ASSESSMENT:  ? ?56 year old male who presented to the ED on 5/11 with diarrhea and weakness. 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 Pt admitted with AKI. ? ?RD working remotely. Pt has been followed closely  by [place] team. Notes reviewed. ? ?Spoke with pt via phone call to room around 0830. Pt reports that so far today he has received 1 bolus (237 ml) of Nepro tube feeding formula earlier this morning. Pt reports tolerating this bolus without issue. Pt denies any nausea, vomiting, or heartburn at time of RD phone call. ? ?Pt reports struggling with recent adjustment in tube feeding formula from Osmolite 1.5 to Nepro Carb Steady. Per review of notes, this change occurred on 12/07/21 at the request of Dr. Court Joy. Prior to this date, pt was on a bolus tube feeding regimen of 6 cartons of Osmolite 1.5 daily (administered as 2 cartons 3 times daily with 60 ml of free water before and after each bolus feed). Per review of notes, pt was at times still not meeting full tube feeding goal. For example, per [place] RD note on 12/04/21, pt reported only taking 3.5 cartons of Osmolite 1.5 daily. Per [place] RD note on 11/27/21, pt reported only taking 4 cartons of Osmolite 1.5 daily. ? ?Pt reports that Nepro formula makes him feel fuller to the point where he does not think he can administer additional boluses. Pt reports that PTA, the highest number of Nepro cartons he was able to tolerate daily was 3 but most days was doing 2.5 cartons. He reports that some days he was "so out of it" that he only did 1 carton. Pt  endorses feelings of heartburn and N/V on Nepro formula. He denies heartburn on Osmolite 1.5 formula but reports that he did experience N/V while on Osmolite 1.5. ? ?Pt reports that in the few days PTA, he was not taking anything PO. Pt is currently NPO this admission. Per [place] RD note on 12/18/21, pt was drinking unsweetened tea and a little bit of Sprite by mouth at that time. Pt was experiencing constipation at that time and had started to take miralax. Noted pt reported diarrhea on arrival to the ED. Suspect pt may have increased bowel regimen too much, leading to diarrhea. ? ?Per review of  notes, pt with increased edema/swelling. Pt reports that he is "holding on to water." Noted IV lasix and IV albumin have been ordered. Per nursing edema assessment, pt with deep pitting edema to RLE. Will try to minimize free water flushes at this time but suspect AKI on admission was related to dehydration and pt may benefit from increased free water flushes once edema resolves. ? ?Reviewed weight history in chart. Pt with a 6.4 kg weight loss since 11/07/21. This is an 8.5% weight loss in less than 2 months which is severe and significant for timeframe. Given pt with edema, suspect true dry weight is lower than current weight. Pt previously with moderate malnutrition. Suspect some degree of malnutrition remains present but unable to confirm without NFPE. ? ?Discussed options for bolus tube feeds during admission. Pt reports that he wants to try staying with Nepro formula due to lower number of cartons per day. Pt agreeable to trying Osmolite 1.5 if he is unable to tolerate Nepro and/or unable to reach goal of 5 cartons daily. Given low potassium and phosphorus labs on admission, question whether pt truly needs renal tube feeding formula. Low threshold to change back to Osmolite 1.5. ? ?RD attempted to call pt back at 1050 after second tube feeding bolus has been administered to assess tolerance. No answer at that time. ? ?Current TF: Nepro Carb Steady 237 ml (1 carton) 5 times daily with 30 ml of salt water flushes before and after (per [place] RD notes, salt water flushes were discontinued several weeks ago) ? ?Medications reviewed and include: IV lasix 40 mg daily, SSI q 4 hours, novolog 6 units q 8 hours, semglee 15 units daily, liquid MVI, cellcept, K phos 500 mg x 2 doses, miralax, prednisone, senna, prograf, vitamin B-12 500 mcg daily, IV albumin 25 grams daily ? ?Labs reviewed: potassium 3.4, BUN 89, creatinine 2.19, phosphorus 1.9, hemoglobin 8.0, hemoglobin A1C 8.2 on 10/21/21 ?CBG's: 158-443 x 24  hours ? ?UOP: 1445 ml x 24 hours ?I/O's: -1.0 L since admit ? ?NUTRITION - FOCUSED PHYSICAL EXAM: ? ?Unable to complete at this time. RD working remotely. ? ?Diet Order:   ?Diet Order   ? ? None  ? ?  ? ? ?EDUCATION NEEDS:  ? ?Education needs have been addressed ? ?Skin:  Skin Assessment: ?Skin Integrity Issues: ?Stage I: coccyx ? ?Last BM:  12/20/21 ? ?Height:  ? ?Ht Readings from Last 1 Encounters:  ?12/20/21 '5\' 11"'$  (1.803 m)  ? ? ?Weight:  ? ?Wt Readings from Last 1 Encounters:  ?12/20/21 68.8 kg  ? ? ?BMI:  Body mass index is 21.16 kg/m?. ? ?Estimated Nutritional Needs:  ? ?Kcal:  2100-2300 ? ?Protein:  100-120 grams ? ?Fluid:  1.8 L/day ? ? ? ?Gustavus Bryant, MS, RD, LDN ?Inpatient Clinical Dietitian ?Please see AMiON for contact information. ? ?

## 2021-12-22 NOTE — TOC Progression Note (Signed)
Transition of Care (TOC) - Progression Note  ? ? ?Patient Details  ?Name: Arthur Burke ?MRN: [id number] ?Date of Birth: [birth year] ? ?Transition of Care (TOC) CM/SW Contact  ?Purcell Mouton, RN ?Phone Number: ?12/22/2021, 12:23 PM ? ?Clinical Narrative:    ?Faxed pt to SNF.  ? ? ?Expected Discharge Plan: [place] ?Barriers to Discharge: No Barriers Identified ? ?Expected Discharge Plan and Services ?Expected Discharge Plan: [place] ?  ?  ?Post Acute Care Choice: Home Health ?Living arrangements for the past 2 months: Apartment ?                ?  ?  ?  ?  ?  ?  ?  ?  ?  ?  ? ? ?Social Determinants of Health (SDOH) Interventions ?  ? ?Readmission Risk Interventions ?   ? View : No data to display.  ?  ?  ?  ? ? ?

## 2021-12-22 NOTE — Progress Notes (Addendum)
?PROGRESS NOTE ? ? ? ?Arthur Burke  JQZ:[id number] DOB: 22-Jul-1966 DOA: 12/20/2021 ?PCP: Pcp, No  ? ? ? ?Brief Narrative:  ? ?H/o DM1 with vision impairment, ESRD due to DM1 s/p renal transplant in 2005, on immunosuppression, ,left BKA, recently diagnosed with squamous cell ca of throat, getting XRT, developed  throat lymphedema getting outpatient treatment, presents with  failure to thrive,  states not able tolerate tube feed 5 times a day, has been getting 2-3 times a day, he has increase lower extremity edema/ stump edema, not able to put on prosthesis, became wheelchair bound the last two weeks, developed sacral stage I sacral ulcer, missed XRT last week due to FTT, found to have hypoglycemia, progressive worsening renal function  ? ? ? ?Subjective: ? ?Labile blood glucose, as low as 56, as high as 470, requires frequent monitoring, labs, and meds titration ? ?Had urinary retention (442cc), Foley inserted on 5/12 ?BUN /cr slowly increasing, intravascularly depletion? ?Peripheral edema, with poor nutrition status, possibly third spacing ?Hypophosphatemia, hypokalemia support poor nutrition status ? ?Reports chronic back pain, prn oxy helped  ? ?Assessment & Plan: ? Principal Problem: ?  Hypoglycemia ?Active Problems: ?  History of kidney transplant ?  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  S/P BKA (below knee amputation) unilateral, left ([place]) ?  Benign essential HTN ?  Essential hypertension ?  Dehydration ?  Oropharyngeal cancer ([place]) ? ? ? ?Assessment and Plan: ? ?Hypoglycemia ?-Suspect due to inconsistent nutrition in the setting of progressive renal failure ?-Patient report not able to tolerate tube feeds 5 times a day, he has been doing twice to 3 times a day ?-Hypoglycemia corrected, continue hypoglycemia protocol ?-Nutrition consult ? ?Progressive worsening of BUN/creatinine with CKD 4 ?-history of ESRD from DM1 s/p renal transplant in 2005, on immune suppression,  continue ?-Does have bilateral lower extremity edema, suspect intravascular depletion due to poor nutrition, venous doppler negative for DVT ?-Compression stocking ?-Nephrology following, trial dose of IV Lasix with albumin ? ?Acute urinary retention/ 442 cc ?Foley insertion on 5/12 ?Start Flomax ?Increase activity ?Avoid narcotics if able ?Avoid constipation ?Plan for voiding trial in 1 to 2 days ? ?Hypokalemia/hypophosphatemia ?Replace, recheck in the morning ? ?Squamous cell carcinoma of the throat getting XRT ?-Getting outpatient treatment for Throat lymphedema ?-Nutrition and meds per PEG ?-missed XRT for a week due to failure to thrive ?-Patient also report difficulty going to XRT daily ? ?Anemia of chronic disease ?Hemoglobin stable above 8 ?Monitor ? ?Type 1 diabetes with vision impairment, uncontrolled ?Labile blood glucose has significant lows and highs require frequent monitoring/labs/meds titration ? ?Left BKA ?Report not able to use prosthesis due to stump edema recently ? ?Chronic back pain since 2012, getting prn oxycodone ? ? ?FTT, appear rather acute ?Per PT  "he ambulated 600 feet in hallway no AD and pushing IV pole back in March." ?he reports lives along was able to feed his cat and take out trash , basically independent before ?he has increase lower extremity edema/ stump edema, not able to put on prosthesis, became wheelchair bound the last one -two weeks, developed sacral stage I sacral ulcer, missed XRT last week due to FTT, ?PT eval, likely will need short term placement ? ? ?Skin Assessment: ?I have examined the patient?s skin and I agree with the wound assessment as performed by the wound care RN as outlined below: ?Pressure Injury 12/20/21 Coccyx Bilateral;Medial Stage 1 -  Intact skin with non-blanchable redness  of a localized area usually over a bony prominence. Reddened non blanchable area to coccyx and bilat buttocks (Active)  ?12/20/21 2330  ?Location: Coccyx  ?Location Orientation:  Bilateral;Medial  ?Staging: Stage 1 -  Intact skin with non-blanchable redness of a localized area usually over a bony prominence.  ?Wound Description (Comments): Reddened non blanchable area to coccyx and bilat buttocks  ?Present on Admission: Yes  ?Dressing Type Foam - Lift dressing to assess site every shift 12/21/21 1959  ? ? ? ?I have Reviewed nursing notes, Vitals, pain scores, I/o's, Lab results and  imaging results since pt's last encounter, details please see discussion above  ?I ordered the following labs:  ?Unresulted Labs (From admission, onward)  ? ?  Start     Ordered  ? 12/23/21 0500  CBC  Tomorrow morning,   R       ?Question:  Specimen collection method  Answer:  IV Team=IV Team collect  ? 12/22/21 0816  ? 12/23/21 0459  Basic metabolic panel  Tomorrow morning,   R       ?Question:  Specimen collection method  Answer:  IV Team=IV Team collect  ? 12/22/21 0816  ? 12/23/21 0500  Phosphorus  Tomorrow morning,   R       ?Question:  Specimen collection method  Answer:  IV Team=IV Team collect  ? 12/22/21 0818  ? 12/23/21 0500  Magnesium  Tomorrow morning,   R       ?Question:  Specimen collection method  Answer:  IV Team=IV Team collect  ? 12/22/21 0818  ? ?  ?  ? ?  ? ? ? ?DVT prophylaxis: heparin injection 5,000 Units Start: 12/21/21 0600 ? ? ?Code Status:   Code Status: Full Code ? ?Family Communication: Patient ?Disposition:  ? ? Dispo: The patient is from: Home, lives alone ?             Anticipated d/c is to: CIR versus SNF ?             Anticipated d/c date is: TBD ? ?Antimicrobials:   ?Anti-infectives (From admission, onward)  ? ? None  ? ?  ? ? ? ? ? ? ?Objective: ?Vitals:  ? 12/21/21 0837 12/21/21 1202 12/21/21 2019 12/22/21 0438  ?BP: (!) 146/68 (!) 142/68 137/63 (!) 150/65  ?Pulse: 83 77 69 65  ?Resp: '20 18 19 19  '$ ?Temp: 100.3 ?F (37.9 ?C) 99.4 ?F (37.4 ?C) 98.2 ?F (36.8 ?C) 98.3 ?F (36.8 ?C)  ?TempSrc: Oral Oral Oral Oral  ?SpO2: 98% 97% 96% 97%  ?Weight:      ?Height:       ? ? ?Intake/Output Summary (Last 24 hours) at 12/22/2021 0818 ?Last data filed at 12/22/2021 9774 ?Gross per 24 hour  ?Intake 600 ml  ?Output 1445 ml  ?Net -845 ml  ? ?Filed Weights  ? 12/20/21 1909  ?Weight: 68.8 kg  ? ? ?Examination: ? ?General exam: aaox3, appear weak, + throat/neck edema , + peg tube ?Respiratory system: Clear to auscultation. Respiratory effort normal. ?Cardiovascular system:  RRR.  ?Gastrointestinal system: Abdomen is nondistended, soft and nontender.  Normal bowel sounds heard. ?Central nervous system: Alert and oriented. No focal neurological deficits. ?Extremities:  bilateral lower extremity edema, status post left BKA, stump of edema ?Skin: Stage I sacral ulcer present on admission ?Psychiatry: Judgement and insight appear normal. Mood & affect appropriate.  ? ? ? ?Data Reviewed: I have personally reviewed  labs and visualized  imaging studies since  the last encounter and formulate the plan  ? ? ? ? ? ? ?Scheduled Meds: ? amLODipine  10 mg Per Tube Daily  ? atorvastatin  40 mg Per Tube QPM  ? carvedilol  25 mg Per Tube BID  ? Chlorhexidine Gluconate Cloth  6 each Topical Daily  ? cloNIDine  0.1 mg Per Tube BID  ? feeding supplement (NEPRO CARB STEADY)  237 mL Per Tube 5 X Daily  ? heparin  5,000 Units Subcutaneous Q8H  ? insulin aspart  0-9 Units Subcutaneous Q4H  ? insulin aspart  6 Units Subcutaneous Q8H  ? insulin glargine-yfgn  15 Units Subcutaneous Daily  ? multivitamin  15 mL Per Tube Daily  ? mycophenolate  250 mg Per Tube BID  ? phosphorus  500 mg Per Tube BID  ? polyethylene glycol  17 g Oral Daily  ? predniSONE  5 mg Per Tube Daily  ? senna-docusate  1 tablet Oral BID  ? tacrolimus  1 mg Oral BID  ? tamsulosin  0.4 mg Oral Daily  ? vitamin B-12  500 mcg Per Tube Daily  ? ?Continuous Infusions: ? ? LOS: 0 days  ? ? ? ? ?Florencia Reasons, MD PhD FACP ?[facility] ? ?Available via Epic secure chat 7am-7pm for nonurgent issues ?Please page for urgent issues ?To page the attending  provider between 7A-7P or the covering provider during after hours 7P-7A, please log into the web site [email or web address] and access using universal [place] password for that web site. If you do not have the password, please call the

## 2021-12-22 NOTE — [place] FL2 (Signed)
?[place] MEDICAID FL2 LEVEL OF CARE SCREENING TOOL  ?  ? ?IDENTIFICATION  ?Patient Name: ?Arthur Burke Birthdate: 03-Oct-1965 Sex: male Admission Date (Current Location): ?12/20/2021  ?[place] and [place] Number: ? [place] ?  Facility and Address:  ?[facility],  [place], [place] ?     Provider Number: ?[id number]  ?Attending Physician Name and Address:  ?Florencia Reasons, MD ? Relative Name and Phone Number:  ?Irfan, Veal   [phone] ?   ?Current Level of Care: ?Hospital Recommended Level of Care: ?[place] Prior Approval Number: ?  ? ?Date Approved/Denied: ?  PASRR Number: ?[id number] ? ?Discharge Plan: ?SNF ?  ? ?Current Diagnoses: ?Patient Active Problem List  ? Diagnosis Date Noted  ? Hypoglycemia 12/20/2021  ? 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 Nausea 11/07/2021  ? Malnutrition of moderate degree 10/26/2021  ? Type 1 diabetes mellitus with hyperglycemia (HCC)   ? Dysphagia   ? Dehydration 10/21/2021  ? Oropharyngeal cancer ([place]) 10/21/2021  ? Fall   ? Essential hypertension   ? Unilateral complete BKA, left, sequela ([place])   ? Leukocytosis   ? AKI (acute kidney injury) ([place])   ? Diabetic peripheral neuropathy associated with type 2 diabetes mellitus ([place])   ? Amputation of left lower extremity below knee ([place]) 08/26/2017  ? Anemia in chronic kidney disease   ? Unilateral complete BKA, left, subsequent encounter ([place])   ? Post-operative pain   ? Stage 3 chronic kidney disease ([place])   ? Hyponatremia   ? Benign essential HTN   ? Acute blood loss anemia   ? S/P BKA (below knee amputation) unilateral, left ([place]) 08/23/2017  ? Streptococcal bacteremia 08/23/2017  ? Normocytic anemia   ? History of kidney transplant   ? 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 ([place])   ? ? ?Orientation RESPIRATION BLADDER Height & Weight   ?  ?Self, Time, Situation, Place ? Normal  Continent Weight: 68.8 kg ?Height:  '5\' 11"'$  (180.3 cm)  ?BEHAVIORAL SYMPTOMS/MOOD NEUROLOGICAL BOWEL NUTRITION STATUS  ?    Continent Feeding tube  ?AMBULATORY STATUS COMMUNICATION OF NEEDS Skin   ?Extensive Assist (Left Leg Amputation) Verbally PU Stage and Appropriate Care (Coccyx Bilateral Medial Stage 1 & Bilateral Buttocks) ?PU Stage 1 Dressing: Daily ?  ?  ?    ?     ?     ? ? ?Personal Care Assistance Level of Assistance  ?Bathing, Feeding, Dressing Bathing Assistance: Limited assistance ?Feeding assistance: Maximum assistance (PEG TUBE) ?Dressing Assistance: Limited assistance ?   ? ?Functional Limitations Info  ?Sight, Hearing, Speech Sight Info: Impaired ?Hearing Info: Adequate ?Speech Info: Adequate  ? ? ?SPECIAL CARE FACTORS FREQUENCY  ?PT (By licensed PT), OT (By licensed OT)   ?  ?PT Frequency: x5 week ?OT Frequency: x5 week ?  ?  ?  ?   ? ? ?Contractures Contractures Info: Not present  ? ? ?Additional Factors Info  ?Code Status, Allergies Code Status Info: FULL ?Allergies Info: Zolpidem ?  ?  ?  ?   ? ?Current Medications (12/22/2021):  This is the current hospital active medication list ?Current Facility-Administered Medications  ?Medication Dose Route Frequency Provider Last Rate Last Admin  ? albumin human 25 % solution 25 g  25 g Intravenous Daily Florencia Reasons, MD 60 mL/hr at 12/22/21 1146 25 g at 12/22/21 1146  ? amLODipine (NORVASC)  tablet 10 mg  10 mg Per Tube Daily Rise Patience, MD   10 mg at 12/22/21 1610  ? atorvastatin (LIPITOR) tablet 40 mg  40 mg Per Tube QPM Rise Patience, MD   40 mg at 12/21/21 9604  ? carvedilol (COREG) tablet 25 mg  25 mg Per Tube BID Rise Patience, MD   25 mg at 12/22/21 5409  ? Chlorhexidine Gluconate Cloth 2 % PADS 6 each  6 each Topical Daily Florencia Reasons, MD   6 each at 12/21/21 1106  ? cloNIDine (CATAPRES) tablet 0.1 mg  0.1 mg Per Tube BID Rise Patience, MD   0.1 mg at 12/22/21 8119  ? feeding supplement (NEPRO CARB STEADY) liquid 237 mL  237  mL Per Tube 5 X Daily Florencia Reasons, MD      ? free water 60 mL  60 mL Per Tube 5 X Daily Florencia Reasons, MD   60 mL at 12/22/21 0933  ? furosemide (LASIX) injection 40 mg  40 mg Intravenous Daily Florencia Reasons, MD   40 mg at 12/22/21 0929  ? heparin injection 5,000 Units  5,000 Units Subcutaneous Q8H Rise Patience, MD   5,000 Units at 12/22/21 1478  ? insulin aspart (novoLOG) injection 0-9 Units  0-9 Units Subcutaneous Q4H Florencia Reasons, MD   2 Units at 12/22/21 [phone]  ? insulin aspart (novoLOG) injection 6 Units  6 Units Subcutaneous Q8H Florencia Reasons, MD   6 Units at 12/22/21 [phone]  ? insulin glargine-yfgn (SEMGLEE) injection 15 Units  15 Units Subcutaneous Daily Florencia Reasons, MD   15 Units at 12/22/21 1154  ? lidocaine (XYLOCAINE) 2 % viscous mouth solution 15 mL  15 mL Mouth/Throat Q6H PRN Rise Patience, MD      ? Derrill Memo ON 12/23/2021] multivitamin (RENA-VIT) tablet 1 tablet  1 tablet Oral QHS Florencia Reasons, MD      ? mycophenolate (CELLCEPT) 200 MG/ML suspension 250 mg  250 mg Per Tube BID Rise Patience, MD   250 mg at 12/22/21 1154  ? ondansetron (ZOFRAN) injection 4 mg  4 mg Intravenous Q6H PRN Florencia Reasons, MD   4 mg at 12/21/21 8657  ? oxyCODONE (Oxy IR/ROXICODONE) immediate release tablet 15 mg  15 mg Per Tube Q4H PRN Florencia Reasons, MD      ? phosphorus (K PHOS NEUTRAL) tablet 500 mg  500 mg Per Tube BID Florencia Reasons, MD   500 mg at 12/22/21 8469  ? polyethylene glycol (MIRALAX / GLYCOLAX) packet 17 g  17 g Oral Daily Florencia Reasons, MD   17 g at 12/22/21 6295  ? predniSONE (DELTASONE) tablet 5 mg  5 mg Per Tube Daily Rise Patience, MD   5 mg at 12/22/21 2841  ? senna-docusate (Senokot-S) tablet 1 tablet  1 tablet Oral BID Florencia Reasons, MD   1 tablet at 12/22/21 3244  ? tacrolimus (PROGRAF) capsule 1 mg  1 mg Oral BID Florencia Reasons, MD   1 mg at 12/22/21 0102  ? tamsulosin (FLOMAX) capsule 0.4 mg  0.4 mg Oral Daily Florencia Reasons, MD   0.4 mg at 12/22/21 7253  ? vitamin B-12 (CYANOCOBALAMIN) tablet 500 mcg  500 mcg Per Tube Daily Rise Patience, MD   500 mcg at 12/22/21 6644  ? ? ? ?Discharge Medications: ?Please see discharge summary for a list of discharge medications. ? ?Relevant Imaging Results: ? ?Relevant Lab Results: ? ? ?Additional Information ?SS#[id number] ? ?Marvelyn Bouchillon, RN ? ? ? ? ?

## 2021-12-22 NOTE — Evaluation (Signed)
Physical Therapy Evaluation ?Patient Details ?Name: Arthur Burke ?MRN: [id number] ?DOB: 1966-04-08 ?Today's Date: 12/22/2021 ? ?History of Present Illness ? Patient is 56 y.o. male admitted for PMH significant for DM, HTN, impaired vision, Lt LE ulcer s/p Lt BKA in 2019, renal disorder s/p kidney transplant in 2006. Pt presents to ED for weakness and dehydration. 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  Has had increased swelling of throat this week and took furosemide without improvement.  Also had diarrhea after having constipation and taking laxatives. Patient has a PEG tube through which he gets most of his nutrition. Pt hypoglycemic in ED and admitted for managment of AKI. ? ?  ?Clinical Impression ? Arthur Burke is 56 y.o. male admitted with above HPI and diagnosis. Patient is currently limited by functional impairments below (see PT problem list). Patient lives alone and is independent at baseline and does his own cooking, cleaning, ADLs, and mobilizes with his prosthesis and no device. Due to his weakness he has been requiring his WC for mobility in home and is not able to ambulate to his bathroom. He currently requires min assist for transfers and gait with RW and was able to ambulate ~10'. Patient will benefit from continued skilled PT interventions to address impairments and progress independence with mobility, recommending AIR for intense rehab as pt is below his normal independent level with mobility and ADL's. Acute PT will follow and progress as able.    ?   ? ?Recommendations for follow up therapy are one component of a multi-disciplinary discharge planning process, led by the attending physician.  Recommendations may be updated based on patient status, additional functional criteria and insurance authorization. ? ?Follow Up Recommendations Acute inpatient rehab (3hours/day) ? ?  ?Assistance Recommended at Discharge Intermittent Supervision/Assistance  ?Patient can return home with  the following ? A little help with walking and/or transfers;A little help with bathing/dressing/bathroom;Assistance with cooking/housework;Direct supervision/assist for medications management;Help with stairs or ramp for entrance;Assist for transportation ? ?  ?Equipment Recommendations None recommended by PT  ?Recommendations for Other Services ? Rehab consult; OT Consult ?  ?Functional Status Assessment Patient has had a recent decline in their functional status and demonstrates the ability to make significant improvements in function in a reasonable and predictable amount of time.  ? ?  ?Precautions / Restrictions Precautions ?Precautions: Fall ?Precaution Comments: Lt BKA ?Restrictions ?Weight Bearing Restrictions: No  ? ?  ? ?Mobility ? Bed Mobility ?Overal bed mobility: Modified Independent ?  ?  ?  ?  ?  ?  ?  ?  ? ?Transfers ?Overall transfer level: Needs assistance ?  ?Transfers: Sit to/from Stand ?Sit to Stand: Min assist ?  ?  ?  ?  ?  ?General transfer comment: cues to use RW, light assist to power up for rise to RW. ?  ? ?Ambulation/Gait ?Ambulation/Gait assistance: Min assist ?Gait Distance (Feet): 10 Feet ?Assistive device: Rolling walker (2 wheels) ?Gait Pattern/deviations: Step-through pattern, Decreased stride length, Decreased weight shift to left ?Gait velocity: decr ?  ?  ?General Gait Details: pt able to ambulate short distance in room but limited by pain at Lt knee due to poor fitting prosthesis. pt required min assist sto steady due to slight stagger as well due to hip weakness. ? ?Stairs ?  ?  ?  ?  ?  ? ?Wheelchair Mobility ?  ? ?Modified Rankin (Stroke Patients Only) ?  ? ?  ? ?Balance Overall balance assessment: Needs  assistance ?Sitting-balance support: Feet supported ?Sitting balance-Leahy Scale: Good ?Sitting balance - Comments: able to don prosthesis in sitting EOB ?  ?Standing balance support: Bilateral upper extremity supported, During functional activity ?Standing balance-Leahy  Scale: Fair ?  ?  ?  ?  ?  ?  ?  ?  ?  ?  ?  ?  ?   ? ? ? ?Pertinent Vitals/Pain Pain Assessment ?Pain Assessment: No/denies pain  ? ? ?Home Living Family/patient expects to be discharged to:: Private residence ?Living Arrangements: Alone (brother helpos) ?Available Help at Discharge: Family;Friend(s);Available PRN/intermittently ?Type of Home: Apartment ?Home Access: Level entry ?  ?  ?  ?Home Layout: One level ?Home Equipment: Conservation officer, nature (2 wheels);Wheelchair Probation officer (4 wheels);Tub bench;BSC/3in1 ?   ?  ?Prior Function Prior Level of Function : Independent/Modified Independent ?  ?  ?  ?  ?  ?  ?Mobility Comments: Mod indep ambulating with LLE prosthetic and no DME; will hop on RLE with walker when opting not to don prosthetic; also use of manual w/c. ?ADLs Comments: Mod indep with ADL/iADLs; does not drive ?  ? ? ?Hand Dominance  ? Dominant Hand: Right ? ?  ?Extremity/Trunk Assessment  ? Upper Extremity Assessment ?Upper Extremity Assessment: Overall WFL for tasks assessed ?  ? ?Lower Extremity Assessment ?Lower Extremity Assessment: RLE deficits/detail;LLE deficits/detail ?RLE Deficits / Details: good strength, 4/5 grossly with testing ?LLE Deficits / Details: residual limb intact, no excessive edema noted ?  ? ?Cervical / Trunk Assessment ?Cervical / Trunk Assessment: Normal  ?Communication  ? Communication: No difficulties  ?Cognition Arousal/Alertness: Awake/alert ?Behavior During Therapy: [facility] for tasks assessed/performed ?Overall Cognitive Status: Within Functional Limits for tasks assessed ?  ?  ?  ?  ?  ?  ?  ?  ?  ?  ?  ?  ?  ?  ?  ?  ?  ?  ?  ? ?  ?General Comments   ? ?  ?Exercises    ? ?Assessment/Plan  ?  ?PT Assessment Patient needs continued PT services  ?PT Problem List Decreased strength;Decreased activity tolerance;Decreased balance;Decreased mobility;Decreased knowledge of precautions;Pain ? ?   ?  ?PT Treatment Interventions DME instruction;Gait training;Stair training;Functional  mobility training;Therapeutic activities;Therapeutic exercise;Balance training;Neuromuscular re-education;Patient/family education;Wheelchair mobility training   ? ?PT Goals (Current goals can be found in the Care Plan section)  ?Acute Rehab PT Goals ?Patient Stated Goal: regain strength and independence ?PT Goal Formulation: With patient ?Time For Goal Achievement: 01/05/22 ?Potential to Achieve Goals: Good ? ?  ?Frequency Min 3X/week ?  ? ? ?Co-evaluation   ?  ?  ?  ?  ? ? ?  ?AM-PAC PT "6 Clicks" Mobility  ?Outcome Measure Help needed turning from your back to your side while in a flat bed without using bedrails?: None ?Help needed moving from lying on your back to sitting on the side of a flat bed without using bedrails?: None ?Help needed moving to and from a bed to a chair (including a wheelchair)?: A Little ?Help needed standing up from a chair using your arms (e.g., wheelchair or bedside chair)?: A Little ?Help needed to walk in hospital room?: A Little ?Help needed climbing 3-5 steps with a railing? : A Lot ?6 Click Score: 19 ? ?  ?End of Session Equipment Utilized During Treatment: Gait belt ?Activity Tolerance: Patient tolerated treatment well ?Patient left: in chair;with call bell/phone within reach;with chair alarm set ?Nurse Communication: Mobility status ?PT Visit Diagnosis: Unsteadiness  on feet (R26.81);Muscle weakness (generalized) (M62.81);Difficulty in walking, not elsewhere classified (R26.2) ?  ? ?Time: 9379-0240 ?PT Time Calculation (min) (ACUTE ONLY): 29 min ? ? ?Charges:   PT Evaluation ?$PT Eval Low Complexity: 1 Low ?PT Treatments ?$Gait Training: 8-22 mins ?  ?   ? ? ?Gwynneth Albright PT, DPT ?Acute Rehabilitation Services ?Office [phone] ?Pager [phone]  ? ? ?Jacques Navy ?12/22/2021, 12:58 PM ? ?

## 2021-12-22 NOTE — Plan of Care (Signed)
BG improved as noted. No reports of nausea. Pt ambulated with prosthesis from pt room down hall cut through main nurses' station and back to room with one rest break. ? ? ?Problem: Education: ?Goal: Knowledge of General Education information will improve ?Description: Including pain rating scale, medication(s)/side effects and non-pharmacologic comfort measures ?Outcome: Progressing ?  ?Problem: Health Behavior/Discharge Planning: ?Goal: Ability to manage health-related needs will improve ?Outcome: Progressing ?  ?Problem: Clinical Measurements: ?Goal: Ability to maintain clinical measurements within normal limits will improve ?Outcome: Progressing ?Goal: Will remain free from infection ?Outcome: Progressing ?Goal: Diagnostic test results will improve ?Outcome: Progressing ?Goal: Respiratory complications will improve ?Outcome: Progressing ?Goal: Cardiovascular complication will be avoided ?Outcome: Progressing ?  ?Problem: Activity: ?Goal: Risk for activity intolerance will decrease ?Outcome: Progressing ?  ?Problem: Nutrition: ?Goal: Adequate nutrition will be maintained ?Outcome: Progressing ?  ?Problem: Coping: ?Goal: Level of anxiety will decrease ?Outcome: Progressing ?  ?Problem: Elimination: ?Goal: Will not experience complications related to bowel motility ?Outcome: Progressing ?Goal: Will not experience complications related to urinary retention ?Outcome: Progressing ?  ?Problem: Pain Managment: ?Goal: General experience of comfort will improve ?Outcome: Progressing ?  ?Problem: Safety: ?Goal: Ability to remain free from injury will improve ?Outcome: Progressing ?  ?Problem: Skin Integrity: ?Goal: Risk for impaired skin integrity will decrease ?Outcome: Progressing ?  ?

## 2021-12-22 NOTE — Plan of Care (Signed)
?  Problem: Clinical Measurements: ?Goal: Will remain free from infection ?Outcome: Progressing ?Goal: Diagnostic test results will improve ?Outcome: Progressing ?Goal: Respiratory complications will improve ?Outcome: Progressing ?  ?

## 2021-12-22 NOTE — Progress Notes (Signed)
Right lower extremity venous duplex completed. ?Refer to "CV Proc" under chart review to view preliminary results. ? ?12/22/2021 1:49 PM ?Kelby Aline., MHA, RVT, RDCS, RDMS   ?

## 2021-12-22 NOTE — Progress Notes (Signed)
?[facility] ?Progress Note  ? ?Subjective:   C/o back pain after pain med dose lower than typical.  No new urinary symptoms.   ?I/Os yest 600 / 1445 ? ?Objective ?Vitals:  ? 12/21/21 0837 12/21/21 1202 12/21/21 2019 12/22/21 0438  ?BP: (!) 146/68 (!) 142/68 137/63 (!) 150/65  ?Pulse: 83 77 69 65  ?Resp: '20 18 19 19  '$ ?Temp: 100.3 ?F (37.9 ?C) 99.4 ?F (37.4 ?C) 98.2 ?F (36.8 ?C) 98.3 ?F (36.8 ?C)  ?TempSrc: Oral Oral Oral Oral  ?SpO2: 98% 97% 96% 97%  ?Weight:      ?Height:      ? ?Physical Exam ?Gen: chronically ill but nontoxic  ?Eyes: anicteric, EOMI ?ENT: poor dentition, MMM, suctioning secretions ?Neck: swelling noted, able to speak ?CV: RRR ?Abd: soft, nontender ?Lungs: clear ?GU: no foley, LLQ transplanted kidney nontender ?Extr: L BKA, R ankle 2+ edema to mid tibia now with compression stocking ?Neuro: AOx3 fully conversant ?  ? ?Additional Objective ?Labs: ?Basic Metabolic Panel: ?Recent Labs  ?Lab 12/21/21 ?2101 12/21/21 ?2228 12/22/21 ?0402  ?NA 134* 134* 135  ?K 3.8 3.5 3.4*  ?CL 99 101 100  ?CO2 '28 28 28  '$ ?GLUCOSE 470* 423* 270*  ?BUN 87* 97* 89*  ?CREATININE 2.33* 2.17* 2.19*  ?CALCIUM 7.9* 8.1* 8.3*  ?PHOS  --   --  1.9*  ? ?Liver Function Tests: ?Recent Labs  ?Lab 12/16/21 ?1235 12/20/21 ?1929 12/21/21 ?6195 12/22/21 ?0402  ?AST '20 28 26  '$ --   ?ALT '12 17 16  '$ --   ?ALKPHOS 99 110 109  --   ?BILITOT 2.4* 1.2 1.0  --   ?PROT 6.7 6.9 6.9  --   ?ALBUMIN 2.7* 2.8* 2.8* 2.4*  ? ?Recent Labs  ?Lab 12/20/21 ?1929  ?LIPASE 21  ? ?CBC: ?Recent Labs  ?Lab 12/16/21 ?1235 12/20/21 ?1929 12/21/21 ?0932 12/22/21 ?0402  ?WBC 10.4 9.9 14.1* 10.4  ?NEUTROABS 8.0* 6.4 10.3* 7.1  ?HGB 8.2* 8.9* 9.2* 8.0*  ?HCT 26.9* 27.1* 28.1* 24.6*  ?MCV 92.4 87.7 88.1 88.2  ?PLT 250 320 341 281  ? ?Blood Culture ?   ?Component Value Date/Time  ? SDES BLOOD RIGHT WRIST 08/23/2017 1548  ? SPECREQUEST  08/23/2017 1548  ?  BOTTLES DRAWN AEROBIC ONLY Blood Culture adequate volume  ? CULT NO GROWTH 5 DAYS 08/23/2017 1548  ?  REPTSTATUS 08/28/2017 FINAL 08/23/2017 1548  ? ? ?Cardiac Enzymes: ?No results for input(s): CKTOTAL, CKMB, CKMBINDEX, TROPONINI in the last 168 hours. ?CBG: ?Recent Labs  ?Lab 12/21/21 ?2019 12/21/21 ?2130 12/22/21 ?0004 12/22/21 ?6712 12/22/21 ?4580  ?[place] ?Iron Studies: No results for input(s): IRON, TIBC, TRANSFERRIN, FERRITIN in the last 72 hours. ?'@lablastinr3'$ @ ?Studies/Results: ?DG Abdomen Acute W/Chest ? ?Result Date: 12/20/2021 ?CLINICAL DATA:  Diarrhea. EXAM: DG ABDOMEN ACUTE WITH 1 VIEW CHEST COMPARISON:  PET-CT 11/09/2021 FINDINGS: The right power port is stable. The cardiac silhouette, mediastinal and hilar contours are within limits. No acute pulmonary findings. No worrisome pulmonary lesions and no pleural effusions. Scattered air and contrast in the colon and scattered small bowel loops with air but no distension to suggest obstruction. A feeding gastrostomy tube is noted. No free air. The soft tissue shadows are grossly maintained. Advanced vascular calcifications noted. The bony structures are intact. IMPRESSION: No acute cardiopulmonary findings. No plain film findings for an acute abdominal process. Electronically Signed   By: Marijo Sanes M.D.   On: 12/20/2021 20:29   ?Medications: ? albumin  human    ? ? amLODipine  10 mg Per Tube Daily  ? atorvastatin  40 mg Per Tube QPM  ? carvedilol  25 mg Per Tube BID  ? Chlorhexidine Gluconate Cloth  6 each Topical Daily  ? cloNIDine  0.1 mg Per Tube BID  ? feeding supplement (NEPRO CARB STEADY)  237 mL Per Tube 5 X Daily  ? free water  60 mL Per Tube 5 X Daily  ? furosemide  40 mg Intravenous Daily  ? heparin  5,000 Units Subcutaneous Q8H  ? insulin aspart  0-9 Units Subcutaneous Q4H  ? insulin aspart  6 Units Subcutaneous Q8H  ? insulin glargine-yfgn  15 Units Subcutaneous Daily  ? multivitamin  15 mL Per Tube Daily  ? mycophenolate  250 mg Per Tube BID  ? phosphorus  500 mg Per Tube BID  ? polyethylene glycol  17 g Oral  Daily  ? predniSONE  5 mg Per Tube Daily  ? senna-docusate  1 tablet Oral BID  ? tacrolimus  1 mg Oral BID  ? tamsulosin  0.4 mg Oral Daily  ? vitamin B-12  500 mcg Per Tube Daily  ? ? ? ?Assessment/Plan: Arthur Burke is an 56 y.o. 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  ? ?**Squamous cell ca throat: undergoing XRT but missed a week due to diarrhea and swelling.  Asking to remain admitted until finishes XRT due to burden of travel.  Defer to primary.  ?  ?**ESRD s/p renal transplant - now with baseline CKD 4 of transplant and AKI.    UA with  stable mild proteinuria, no LE or nit, rare bacteria - culture pending but no empiric abx given asymptomatic and UA relatively ok.  FeNa 0.2% though nonoliguric. PVR is normal; had normal transplant [place] 10/2021 - given no LUTs and kidney function fairly stable won't repeat at this time.  Cr strable at 2.2 today.  Continue with tube feeds 5x/day, add free water as needed.  I suspect his RLE edema (which is chronic) may be worsening related to malnutrition/3rd spacing; apply compression stocking; given worse will treat with 1 dose IV lasix + albumin today. Strict I/Os, daily labs for now. ?  ?**Transplant immunosuppression:  on tac 1 BID, cellcept 250 BBID, pred 5 daily.  Cont - he was getting liquid compounded tac here last admission, says has been opening his capsules and putting through tube - either fine with me.  Will check trough level tonight 2200 - given 10a/10p. ?  ?**HTN/Volume: currently on clonidine 0.1 BID, coreg 25 BID, amlodipine 10 daily.  Off ACEi since last admission.  Giving lasix and albumin today given significant edema.   ?  ?**Anemia:  Hb 8-9s, normocytic. Follow.  ? ?**DM type 1:  hypoglycemic prior to admission; primary managing insulin.   Now hyperglycemic and insulin being readded. ? ?Will  follow - please page with concerns/questions.  ? ?Jannifer Hick MD ?12/22/2021, 10:04 AM  ?[facility] ?Pager: [phone] ? ? ?

## 2021-12-23 DIAGNOSIS — E162 Hypoglycemia, unspecified: Secondary | ICD-10-CM | POA: Diagnosis not present

## 2021-12-23 LAB — CBC
HCT: 24.5 % — ABNORMAL LOW (ref 39.0–52.0)
Hemoglobin: 8 g/dL — ABNORMAL LOW (ref 13.0–17.0)
MCH: 28.9 pg (ref 26.0–34.0)
MCHC: 32.7 g/dL (ref 30.0–36.0)
MCV: 88.4 fL (ref 80.0–100.0)
Platelets: 325 10*3/uL (ref 150–400)
RBC: 2.77 MIL/uL — ABNORMAL LOW (ref 4.22–5.81)
RDW: 16.6 % — ABNORMAL HIGH (ref 11.5–15.5)
WBC: 9.1 10*3/uL (ref 4.0–10.5)
nRBC: 0 % (ref 0.0–0.2)

## 2021-12-23 LAB — BASIC METABOLIC PANEL
Anion gap: 5 (ref 5–15)
BUN: 85 mg/dL — ABNORMAL HIGH (ref 6–20)
CO2: 30 mmol/L (ref 22–32)
Calcium: 7.9 mg/dL — ABNORMAL LOW (ref 8.9–10.3)
Chloride: 102 mmol/L (ref 98–111)
Creatinine, Ser: 1.7 mg/dL — ABNORMAL HIGH (ref 0.61–1.24)
GFR, Estimated: 47 mL/min — ABNORMAL LOW (ref >60)
Glucose, Bld: 190 mg/dL — ABNORMAL HIGH (ref 70–99)
Potassium: 3.6 mmol/L (ref 3.5–5.1)
Sodium: 137 mmol/L (ref 135–145)

## 2021-12-23 LAB — GLUCOSE, CAPILLARY
Glucose-Capillary: 153 mg/dL — ABNORMAL HIGH (ref 70–99)
Glucose-Capillary: 178 mg/dL — ABNORMAL HIGH (ref 70–99)
Glucose-Capillary: 188 mg/dL — ABNORMAL HIGH (ref 70–99)
Glucose-Capillary: 194 mg/dL — ABNORMAL HIGH (ref 70–99)
Glucose-Capillary: 205 mg/dL — ABNORMAL HIGH (ref 70–99)
Glucose-Capillary: 222 mg/dL — ABNORMAL HIGH (ref 70–99)

## 2021-12-23 LAB — PHOSPHORUS: Phosphorus: 1.6 mg/dL — ABNORMAL LOW (ref 2.5–4.6)

## 2021-12-23 LAB — MAGNESIUM: Magnesium: 1.9 mg/dL (ref 1.7–2.4)

## 2021-12-23 MED ORDER — K PHOS MONO-SOD PHOS DI & MONO 155-852-130 MG PO TABS
500.0000 mg | ORAL_TABLET | Freq: Two times a day (BID) | ORAL | Status: AC
  Administered 2021-12-23 (×2): 500 mg
  Filled 2021-12-23 (×2): qty 2

## 2021-12-23 MED ORDER — POLYETHYLENE GLYCOL 3350 17 G PO PACK
17.0000 g | PACK | Freq: Two times a day (BID) | ORAL | Status: DC
  Administered 2021-12-23 – 2021-12-25 (×4): 17 g via ORAL
  Filled 2021-12-23 (×4): qty 1

## 2021-12-23 MED ORDER — BISACODYL 10 MG RE SUPP
10.0000 mg | Freq: Every day | RECTAL | Status: DC
  Administered 2021-12-23: 10 mg via RECTAL
  Filled 2021-12-23: qty 1

## 2021-12-23 MED ORDER — SENNOSIDES-DOCUSATE SODIUM 8.6-50 MG PO TABS
2.0000 | ORAL_TABLET | Freq: Two times a day (BID) | ORAL | Status: AC
Start: 2021-12-23 — End: 2021-12-25
  Administered 2021-12-23 – 2021-12-25 (×4): 2 via ORAL
  Filled 2021-12-23 (×4): qty 2

## 2021-12-23 MED ORDER — SODIUM CHLORIDE 0.9% FLUSH: 10.0000 mL | INTRAVENOUS | Status: DC | PRN

## 2021-12-23 MED ORDER — SODIUM CHLORIDE 0.9% FLUSH
10.0000 mL | Freq: Two times a day (BID) | INTRAVENOUS | Status: DC
  Administered 2021-12-23 – 2021-12-24 (×2): 10 mL

## 2021-12-23 NOTE — Evaluation (Signed)
Clinical/Bedside Swallow Evaluation ?Patient Details  ?Name: Arthur Burke ?MRN: [id number] ?Date of Birth: 1966-01-26 ? ?Today's Date: 12/23/2021 ?Time: SLP Start Time (ACUTE ONLY): 2025 SLP Stop Time (ACUTE ONLY): 1331 ?SLP Time Calculation (min) (ACUTE ONLY): 23 min ? ?Past Medical History:  ?Past Medical History:  ?Diagnosis Date  ? Acute osteomyelitis of left calcaneus (HCC)   ? Cancer [facility])   ? Diabetes mellitus without complication ([place])   ? Diabetic foot infection ([place])   ? Hypertension   ? Infected ulcer of skin, with fat layer exposed ([place])   ? Lower limb ulcer, ankle, left, with necrosis of muscle ([place])   ? MRSA (methicillin resistant staph aureus) culture positive   ? Renal disorder   ? Vision impairment   ? ?Past Surgical History:  ?Past Surgical History:  ?Procedure Laterality Date  ? AMPUTATION Left 08/23/2017  ? Procedure: AMPUTATION BELOW KNEE;  Surgeon: Newt Minion, MD;  Location: [place];  Service: Orthopedics;  Laterality: Left;  ? APPLICATION OF WOUND VAC Left 08/23/2017  ? Procedure: APPLICATION OF WOUND VAC;  Surgeon: Newt Minion, MD;  Location: [place];  Service: Orthopedics;  Laterality: Left;  ? IR GASTROSTOMY TUBE MOD SED  10/22/2021  ? IR IMAGING GUIDED PORT INSERTION  10/22/2021  ? KIDNEY TRANSPLANT  2006  ? [facility]  ? SKIN GRAFT    ? L diabetic foot wound with debriedment and skin grafting  ? TOE AMPUTATION    ? R great and 2nd toe  ? ?HPI:  ?Pt is a 56 y.o. male who was admitted secondary to weakness and dehydration. Imaging 5/11 negative for acute cardiopulmonary findings. 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 He missed his  radiation dose this week.  G-tube placed 10/22/21. Baseline MBS 10/26/21: visible tissue abnormality around the valleculae and epiglottis. There is trace residue with liquids and solids in the vallecular space after the swallow. Otherwise the hyolaryngeal mechanism is strong and timely. Pt clears any solids well with a liquid wash. 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 Pt seen by outpatient SLP on 11/29/21 and HEP initiated.  ?  ?Assessment / Plan / Recommendation  ?Clinical Impression ? Pt was seen for bedside swallow evaluation. He reported that swallowing has become painful with radiation treatment and that he has developed intermittent coughing with liquids, globus sensation with advanced solids. Pt stated that he now consumes mostly liquids and some occasional soft solids such as diced peaches. Oral mechanism exam was [facility] for strength and ROM. Bilateral lymphedema noted which pt reported is improved compared to weeks prior. Pt presents with symptoms of iatrogenic pharyngeal dysphagia secondary to radiation treatment. He demonstrated signs of aspiration with thin liquids and symptoms of pharyngeal residue across trials. A modified barium swallow study is recommended to re-assess pt's swallow physiology since initiation of radiation treatment. Pt's NPO status will be maintained until the study is completed, but ice chips will be allowed after oral care. ?SLP Visit Diagnosis: Dysphagia, unspecified (R13.10) ?   ?Aspiration Risk ? Mild aspiration risk;Moderate aspiration risk  ?  ?Diet Recommendation NPO;Ice chips PRN after oral care  ? ?Medication Administration: Via alternative means  ?  ?Other  Recommendations Oral Care Recommendations: Oral care QID;Oral care prior to ice chip/H20   ? ?Recommendations for follow up therapy are one component of a multi-disciplinary discharge planning process, led by the attending physician.  Recommendations may be updated based on patient status,  additional functional criteria and insurance authorization. ? ?Follow up Recommendations Outpatient SLP  ? ? ?  ?Assistance Recommended at Discharge    ?Functional Status Assessment Patient has had a recent decline in their functional status and demonstrates the ability to make significant improvements in function in a reasonable and predictable amount of time.  ?Frequency and Duration min 2x/week  ?2 weeks ?  ?   ? ?Prognosis Prognosis for Safe Diet Advancement: Good ?Barriers to Reach Goals: Severity of deficits  ? ?  ? ?Swallow Study   ?General Date of Onset: 10/21/21 ?HPI: Pt is a 56 y.o. male who was admitted secondary to weakness and dehydration. Imaging 5/11 negative for acute cardiopulmonary findings. 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 He missed his radiation dose this week.  G-tube placed 10/22/21. Baseline MBS 10/26/21: visible tissue abnormality around the valleculae and epiglottis. There is trace residue with liquids and solids in the vallecular space after the swallow. Otherwise the hyolaryngeal mechanism is strong and timely. Pt clears any solids well with a liquid wash. 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 Pt seen by outpatient SLP on 11/29/21 and HEP initiated. ?Type of Study: Bedside Swallow Evaluation ?Previous Swallow Assessment: See HPI ?Diet Prior to this Study: NPO ?Temperature Spikes Noted: No ?Respiratory Status: Room air ?History of Recent Intubation: No ?Behavior/Cognition: Alert;Cooperative;Pleasant mood ?Oral Cavity Assessment: Within Functional Limits ?Oral Care Completed by SLP: No ?Oral Cavity  - Dentition: Adequate natural dentition ?Vision: Functional for self-feeding ?Self-Feeding Abilities: Able to feed self ?Patient Positioning: Upright in bed;Postural control adequate for testing ?Baseline Vocal Quality:  (rough) ?Volitional Cough: Strong ?Volitional Swallow: Able to elicit  ?  ?Oral/Motor/Sensory Function Overall Oral Motor/Sensory Function: Within functional limits   ?Ice Chips Ice chips: Within functional limits ?Presentation: Spoon   ?Thin Liquid Thin Liquid: Impaired ?Presentation: Cup ?Pharyngeal  Phase Impairments: Throat Clearing - Immediate;Cough - Immediate;Multiple swallows  ?  ?Nectar Thick Nectar Thick Liquid: Not tested   ?Honey Thick Honey Thick Liquid: Not tested   ?Puree Puree: Impaired ?Presentation: Spoon ?Pharyngeal Phase Impairments: Multiple swallows   ?Solid ? ? ?  Solid: Impaired ?Presentation: Self Fed ?Pharyngeal Phase Impairments: Multiple swallows  ? ?  ?Patricie Geeslin I. Hardin Negus, [place], CCC-SLP ?Acute Rehabilitation Services ?Office number [phone] ?Pager [phone] ? ?Horton Marshall ?12/23/2021,1:45 PM ? ? ? ? ?

## 2021-12-23 NOTE — Evaluation (Signed)
Occupational Therapy Evaluation ?Patient Details ?Name: Arthur Burke ?MRN: [id number] ?DOB: Nov 17, 1965 ?Today's Date: 12/23/2021 ? ? ?History of Present Illness Patient is 56 y.o. male admitted for PMH significant for DM, HTN, impaired vision, Lt LE ulcer s/p Lt BKA in 2019, renal disorder s/p kidney transplant in 2006. Pt presents to ED for weakness and dehydration. 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  Has had increased swelling of throat this week and took furosemide without improvement.  Also had diarrhea after having constipation and taking laxatives. Patient has a PEG tube through which he gets most of his nutrition. Pt hypoglycemic in ED and admitted for managment of AKI.  ? ?Clinical Impression ?  ?Patient is a 56 year old male who was noted to be admitted for above. Patient was noted to have decreased activity tolerance, decreased endurance, decreased standing balance, increased pain, and decreased safety awareness  impacting participation in ADLs. Patient would continue to benefit from skilled OT services at this time while admitted and after d/c to address noted deficits in order to improve overall safety and independence in ADLs.  ? ?   ? ?Recommendations for follow up therapy are one component of a multi-disciplinary discharge planning process, led by the attending physician.  Recommendations may be updated based on patient status, additional functional criteria and insurance authorization.  ? ?Follow Up Recommendations ? No OT follow up  ?  ?Assistance Recommended at Discharge Intermittent Supervision/Assistance  ?Patient can return home with the following Assistance with cooking/housework;Direct supervision/assist for financial management;Assist for transportation;Help with stairs or ramp for entrance;Direct supervision/assist for medications management ? ?  ?Functional Status Assessment ? Patient has had a recent decline in their functional status and demonstrates the ability to  make significant improvements in function in a reasonable and predictable amount of time.  ?Equipment Recommendations ? None recommended by OT  ?  ?Recommendations for Other Services   ? ? ?  ?Precautions / Restrictions Precautions ?Precautions: Fall ?Precaution Comments: Lt BKA ?Restrictions ?Weight Bearing Restrictions: No  ? ?  ? ?Mobility Bed Mobility ?Overal bed mobility: Modified Independent ?  ?  ?  ?  ?  ?  ?  ?  ? ?Transfers ?  ?  ?  ?  ?  ?  ?  ?  ?  ?  ?  ? ?  ?Balance Overall balance assessment: Needs assistance ?Sitting-balance support: Feet supported ?Sitting balance-Leahy Scale: Good ?  ?  ?Standing balance support: Bilateral upper extremity supported, During functional activity ?Standing balance-Leahy Scale: Fair ?  ?  ?  ?  ?  ?  ?  ?  ?  ?  ?  ?  ?   ? ?ADL either performed or assessed with clinical judgement  ? ?ADL Overall ADL's : Needs assistance/impaired ?Eating/Feeding: Sitting;Modified independent ?Eating/Feeding Details (indicate cue type and reason): patient is allowed to have ice chips at this time. otherwise has a feeding tube ?Grooming: Set up;Sitting ?  ?Upper Body Bathing: Set up;Sitting ?  ?Lower Body Bathing: Minimal assistance;Sit to/from stand ?  ?Upper Body Dressing : Set up;Sitting ?  ?Lower Body Dressing: Minimal assistance;Sit to/from stand ?Lower Body Dressing Details (indicate cue type and reason): patient was able to don prostesis sitting on EOB with increased time with SUP for standing to click into place and set up to collect materials. ?Toilet Transfer: Min guard;Rolling walker (2 wheels);Ambulation ?Toilet Transfer Details (indicate cue type and reason): with prostesis in place. simulated in room  to different surfaces. ?Toileting- Clothing Manipulation and Hygiene: Minimal assistance;Sit to/from stand ?  ?  ?  ?Functional mobility during ADLs: Min guard;Rolling walker (2 wheels) ?General ADL Comments: in hallway with increased time  ? ? ? ?Vision Patient Visual Report:  No change from baseline ?   ?   ?Perception   ?  ?Praxis   ?  ? ?Pertinent Vitals/Pain Pain Assessment ?Pain Assessment: 0-10 ?Pain Score: 8  ?Pain Location: low back - two pinched nerves ?Pain Descriptors / Indicators: Discomfort, Grimacing, Guarding ?Pain Intervention(s): Monitored during session, Repositioned, Limited activity within patient's tolerance  ? ? ? ?Hand Dominance Right ?  ?Extremity/Trunk Assessment Upper Extremity Assessment ?Upper Extremity Assessment: Overall WFL for tasks assessed ?  ?Lower Extremity Assessment ?Lower Extremity Assessment: Defer to PT evaluation ?  ?Cervical / Trunk Assessment ?Cervical / Trunk Assessment: Normal ?  ?Communication Communication ?Communication: No difficulties ?  ?Cognition Arousal/Alertness: Awake/alert ?Behavior During Therapy: [facility] for tasks assessed/performed ?Overall Cognitive Status: Within Functional Limits for tasks assessed ?  ?  ?  ?  ?  ?  ?  ?  ?  ?  ?  ?  ?  ?  ?  ?  ?  ?  ?  ?General Comments    ? ?  ?Exercises   ?  ?Shoulder Instructions    ? ? ?Home Living Family/patient expects to be discharged to:: Private residence ?Living Arrangements: Alone ?Available Help at Discharge: Family;Friend(s);Available PRN/intermittently ?Type of Home: Apartment ?Home Access: Level entry ?  ?  ?Home Layout: One level ?  ?  ?Bathroom Shower/Tub: Tub/shower unit ?  ?Bathroom Toilet: Handicapped height ?  ?  ?Home Equipment: Conservation officer, nature (2 wheels);Wheelchair Probation officer (4 wheels);Tub bench;BSC/3in1 ?  ?  ?  ? ?  ?Prior Functioning/Environment Prior Level of Function : Independent/Modified Independent ?  ?  ?  ?  ?  ?  ?Mobility Comments: Mod indep ambulating with LLE prosthetic and no DME; will hop on RLE with walker when opting not to don prosthetic; also use of manual w/c. ?ADLs Comments: Mod indep with ADL/iADLs; does not drive ?  ? ?  ?  ?OT Problem List: Impaired balance (sitting and/or standing);Decreased safety awareness;Decreased activity  tolerance;Decreased knowledge of precautions;Decreased knowledge of use of DME or AE ?  ?   ?OT Treatment/Interventions: Self-care/ADL training;Neuromuscular education;Energy conservation;DME and/or AE instruction;Therapeutic activities;Balance training;Patient/family education  ?  ?OT Goals(Current goals can be found in the care plan section) Acute Rehab OT Goals ?Patient Stated Goal: to get treatments completed ?OT Goal Formulation: With patient ?Time For Goal Achievement: 01/06/22 ?Potential to Achieve Goals: Good  ?OT Frequency: Min 1X/week ?  ? ?Co-evaluation   ?  ?  ?  ?  ? ?  ?AM-PAC OT "6 Clicks" Daily Activity     ?Outcome Measure Help from another person eating meals?: Total (ice chips only) ?Help from another person taking care of personal grooming?: A Little ?Help from another person toileting, which includes using toliet, bedpan, or urinal?: A Little ?Help from another person bathing (including washing, rinsing, drying)?: A Little ?Help from another person to put on and taking off regular upper body clothing?: A Little ?Help from another person to put on and taking off regular lower body clothing?: A Little ?6 Click Score: 16 ?  ?End of Session Equipment Utilized During Treatment: Rolling walker (2 wheels) ?Nurse Communication: Mobility status ? ?Activity Tolerance: Patient tolerated treatment well ?Patient left: in chair;with call bell/phone within reach ? ?  OT Visit Diagnosis: Unsteadiness on feet (R26.81);Other abnormalities of gait and mobility (R26.89);Pain  ?              ?Time: 6553-7482 ?OT Time Calculation (min): 40 min ?Charges:  OT General Charges ?$OT Visit: 1 Visit ?OT Evaluation ?$OT Eval Low Complexity: 1 Low ?OT Treatments ?$Self Care/Home Management : 23-37 mins ? ?Raymar Joiner OTR/L, MS ?Acute Rehabilitation Department ?Office# [phone] ?Pager# [phone] ? ? ?[place] ?12/23/2021, 4:21 PM ?

## 2021-12-23 NOTE — Progress Notes (Signed)
?[name] ?Progress Note  ? ?Subjective:   Feeling fine this AM.   No new urinary symptoms.   ?I/Os yest 1600 / 2600 (1600 mL UOP and 1079m saliva) ? ?Objective ?Vitals:  ? 12/22/21 0438 12/22/21 1710 12/22/21 1937 12/23/21 0416  ?BP: (!) 150/65 (!) 144/62 (!) 143/64 (!) 156/72  ?Pulse: 65 64 66 65  ?Resp: '19 17 19 19  '$ ?Temp: 98.3 ?F (36.8 ?C) 98 ?F (36.7 ?C) 98.1 ?F (36.7 ?C) 98.1 ?F (36.7 ?C)  ?TempSrc: Oral Oral Oral Oral  ?SpO2: 97% 97% 96% 96%  ?Weight:      ?Height:      ? ?Physical Exam ?Gen: chronically ill but nontoxic  ?Eyes: anicteric, EOMI ?ENT: poor dentition, MMM, suctioning secretions ?Neck: swelling noted, able to speak ?CV: RRR ?Abd: soft, nontender ?Lungs: clear ?GU: no foley, LLQ transplanted kidney nontender ?Extr: L BKA, R ankle 1+ edema at mid tibia now none where sock is present; needs compression stocking back on ?Neuro: AOx3 fully conversant ?  ? ?Additional Objective ?Labs: ?Basic Metabolic Panel: ?Recent Labs  ?Lab 12/21/21 ?2228 12/22/21 ?0402 12/23/21 ?0900  ?NA 134* 135 137  ?K 3.5 3.4* 3.6  ?CL 101 100 102  ?CO2 '28 28 30  '$ ?GLUCOSE 423* 270* 190*  ?BUN 97* 89* 85*  ?CREATININE 2.17* 2.19* 1.70*  ?CALCIUM 8.1* 8.3* 7.9*  ?PHOS  --  1.9* 1.6*  ? ? ?Liver Function Tests: ?Recent Labs  ?Lab 12/16/21 ?1235 12/20/21 ?1929 12/21/21 ?0250505/13/23 ?0402  ?AST '20 28 26  '$ --   ?ALT '12 17 16  '$ --   ?ALKPHOS 99 110 109  --   ?BILITOT 2.4* 1.2 1.0  --   ?PROT 6.7 6.9 6.9  --   ?ALBUMIN 2.7* 2.8* 2.8* 2.4*  ? ? ?Recent Labs  ?Lab 12/20/21 ?1929  ?LIPASE 21  ? ? ?CBC: ?Recent Labs  ?Lab 12/16/21 ?1235 12/20/21 ?1929 12/21/21 ?0397605/13/23 ?0402 12/23/21 ?0900  ?WBC 10.4 9.9 14.1* 10.4 9.1  ?NEUTROABS 8.0* 6.4 10.3* 7.1  --   ?HGB 8.2* 8.9* 9.2* 8.0* 8.0*  ?HCT 26.9* 27.1* 28.1* 24.6* 24.5*  ?MCV 92.4 87.7 88.1 88.2 88.4  ?PLT 250 320 341 281 325  ? ? ?Blood Culture ?   ?Component Value Date/Time  ? SDES BLOOD RIGHT WRIST 08/23/2017 1548  ? SPECREQUEST  08/23/2017 1548  ?  BOTTLES  DRAWN AEROBIC ONLY Blood Culture adequate volume  ? CULT NO GROWTH 5 DAYS 08/23/2017 1548  ? REPTSTATUS 08/28/2017 FINAL 08/23/2017 1548  ? ? ?Cardiac Enzymes: ?No results for input(s): CKTOTAL, CKMB, CKMBINDEX, TROPONINI in the last 168 hours. ?CBG: ?Recent Labs  ?Lab 12/22/21 ?1707 12/22/21 ?1938 12/23/21 ?0001 12/23/21 ?0413 12/23/21 ?0757  ?GLUCAP 115* 165* 178* 153* 194*  ? ? ?Iron Studies: No results for input(s): IRON, TIBC, TRANSFERRIN, FERRITIN in the last 72 hours. ?'@lablastinr3'$ @ ?Studies/Results: ?VAS [place]OWER EXTREMITY VENOUS (DVT) ? ?Result Date: 12/22/2021 ? Lower Venous DVT Study Patient Name:  Arthur Burke Date of Exam:   12/22/2021 Medical Rec #: [id number]    Accession #:    [id number] of Birth: [birth year]    Patient Gender: M Patient Age:   89years [facility]      VAS [place]OWER EXTREMITY VENOUS (DVT) Referring Phys: FAnnamaria BootsXU --------------------------------------------------------------------------------  Indications: Pain, and Edema.  Comparison Study: No prior study Performing Technologist: MMaudry MayhewMHA, RDMS, RVT, RDCS  Examination Guidelines: A complete evaluation includes B-mode  imaging, spectral Doppler, color Doppler, and power Doppler as needed of all accessible portions of each vessel. Bilateral testing is considered an integral part of a complete examination. Limited examinations for reoccurring indications may be performed as noted. The reflux portion of the exam is performed with the patient in reverse Trendelenburg.  +---------+---------------+---------+-----------+----------+--------------+ RIGHT    CompressibilityPhasicitySpontaneityPropertiesThrombus Aging +---------+---------------+---------+-----------+----------+--------------+ CFV      Full           Yes      Yes                                 +---------+---------------+---------+-----------+----------+--------------+ SFJ      Full                                                         +---------+---------------+---------+-----------+----------+--------------+ FV Prox  Full                                                        +---------+---------------+---------+-----------+----------+--------------+ FV Mid   Full                                                        +---------+---------------+---------+-----------+----------+--------------+ FV DistalFull                                                        +---------+---------------+---------+-----------+----------+--------------+ PFV      Full                                                        +---------+---------------+---------+-----------+----------+--------------+ POP      Full           Yes      Yes                                 +---------+---------------+---------+-----------+----------+--------------+ PTV      Full                                                        +---------+---------------+---------+-----------+----------+--------------+ PERO     Full                                                        +---------+---------------+---------+-----------+----------+--------------+   +----+---------------+---------+-----------+----------+--------------+  LEFTCompressibilityPhasicitySpontaneityPropertiesThrombus Aging +----+---------------+---------+-----------+----------+--------------+ CFV Full           Yes      Yes                                 +----+---------------+---------+-----------+----------+--------------+     Summary: RIGHT: - There is no evidence of deep vein thrombosis in the lower extremity.  - No cystic structure found in the popliteal fossa.  LEFT: - No evidence of common femoral vein obstruction.  *See table(s) above for measurements and observations. Electronically signed by Monica Martinez MD on 12/22/2021 at 3:49:13 PM.    Final    ?Medications: ? albumin human 25 g (12/23/21 0941)  ? ? amLODipine  10 mg Per  Tube Daily  ? atorvastatin  40 mg Per Tube QPM  ? carvedilol  25 mg Per Tube BID  ? Chlorhexidine Gluconate Cloth  6 each Topical Daily  ? cloNIDine  0.1 mg Per Tube BID  ? feeding supplement (NEPRO CARB STEADY)  237 mL Per Tube 5 X Daily  ? free water  60 mL Per Tube 5 X Daily  ? furosemide  40 mg Intravenous Daily  ? heparin  5,000 Units Subcutaneous Q8H  ? insulin aspart  0-9 Units Subcutaneous Q4H  ? insulin glargine-yfgn  15 Units Subcutaneous Daily  ? multivitamin  1 tablet Oral QHS  ? mycophenolate  250 mg Per Tube BID  ? polyethylene glycol  17 g Oral Daily  ? predniSONE  5 mg Per Tube Daily  ? senna-docusate  1 tablet Oral BID  ? sodium chloride flush  10-40 mL Intracatheter Q12H  ? tacrolimus  1 mg Oral BID  ? tamsulosin  0.4 mg Oral Daily  ? vitamin B-12  500 mcg Per Tube Daily  ? ? ? ?Assessment/Plan: Arthur Burke is an 56 y.o. 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  ? ?**Squamous cell ca throat: undergoing XRT but missed a week due to diarrhea and swelling.  Asking to remain admitted until finishes XRT due to burden of travel.  Defer to primary.  ?  ?**ESRD s/p renal transplant - now with baseline CKD 4 (baseline Cr 2-2.2 lately) of transplant and AKI.    UA with  stable mild proteinuria, no LE or nit, rare bacteria - culture pending but no empiric abx given asymptomatic and UA relatively ok.  FeNa 0.2% though nonoliguric. PVR is normal; had normal transplant [place] 10/2021 - given no LUTs and kidney function fairly stable won't repeat at this time.  Cr stable/slightly improved at  at 1.7 today.  Continue with tube feeds 5x/day, add free water as needed.  I suspect his RLE edema (which is chronic) may be worsening related to malnutrition/3rd spacing; apply compression stocking. Rec'd 1 dose of lasix /alb on 5/13 with good effect - hold off  today, can re dose PRN.  Strict I/Os, daily labs for now. ?  ?**Transplant immunosuppression:  on tac 1 BID, cellcept 250 BBID, pred 5 daily.  Cont - he was getting liquid compounded tac here last admission, says has been opening his capsules and putting through tube - either fine with me.

## 2021-12-23 NOTE — Progress Notes (Signed)
A.m. ?PROGRESS NOTE ? ? ? ?Arthur Burke  UEK:[id number] DOB: [birth year] DOA: 12/20/2021 ?PCP: Pcp, No  ? ? ? ?Brief Narrative:  ? 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 ? ? ?Subjective: ? ? ?Reports being Constipated,  ?Appear responding to IV Lasix with albumin, Edema is improving, cr improved  ?Blood glucose stable  ? ? ? ?Assessment & Plan: ? Principal Problem: ?  Hypoglycemia ?Active Problems: ?  History of kidney transplant ?  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  S/P BKA (below knee amputation) unilateral, left ([place]) ?  Benign essential HTN ?  Essential hypertension ?  Dehydration ?  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  Pressure injury of skin ? ? ? ?Assessment and Plan: ? ?Hypoglycemia, POA with h/o DM1 ?-Suspect due to inconsistent nutrition in the setting of progressive renal failure ?-Patient report not able to tolerate tube feeds 5 times a day at home, he has been doing twice to 3 times a day ?-Hypoglycemia corrected, continue hypoglycemia protocol ?-Nutrition consult ? ?Progressive worsening of BUN/creatinine with CKD 4 ?-history of ESRD from DM1 s/p renal transplant in 2005, on immune suppression, continue ?-presents with bilateral lower extremity edema, suspect intravascular depletion due to poor nutrition, venous doppler negative for DVT ?-Compression stocking ?-Appear responding to IV Lasix with  albumin, Edema is improving, cr improved  ?-Nephrology following, likely will need  IV Lasix with albumin prn ? ? ?Acute urinary retention/ 442 cc, Foley insertion on 5/12 ?Start Flomax ?Increase activity ?Avoid narcotics if able ?Avoid constipation ?Plan for voiding trial in 1 to 2 days ? ?Hypophosphatemia, hypokalemia support poor nutrition status ?Remain low, continue replace, recheck in am ? 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 ? ?Anemia of chronic disease ?Hemoglobin stable above 8 ?Monitor ? ?Type 1 diabetes with vision impairment, uncontrolled ?Labile blood glucose has significant lows and highs require frequent monitoring/labs/meds titration ? ?Left BKA ?Report not able to use prosthesis due to stump edema recently ? ?Chronic back pain since 2012, getting prn oxycodone ? ? ?FTT, appear rather acute ?Per PT  "he ambulated 600 feet in hallway no AD and pushing IV pole back in March." ?he reports lives along was able to feed his cat and take out trash , basically independent before ?he has increase lower extremity edema/ stump edema, not able to put on prosthesis, became wheelchair bound the last one -two weeks, developed sacral stage I sacral ulcer, missed XRT last week due to FTT, ?PT eval,  may need short term SNF placement vs CIR ? ? ?Skin Assessment: ?I have examined the patient?s skin and I agree with the wound assessment as performed by the wound care RN as outlined below: ?Pressure Injury 12/20/21 Coccyx Bilateral;Medial Stage 1 -  Intact skin with  non-blanchable redness of a localized area usually over a bony prominence. Reddened non blanchable area to coccyx and bilat buttocks (Active)  ?12/20/21 2330  ?Location: Coccyx  ?Location Orientation: Bilateral;Medial  ?Staging: Stage 1 -  Intact skin with non-blanchable redness of a localized area  usually over a bony prominence.  ?Wound Description (Comments): Reddened non blanchable area to coccyx and bilat buttocks  ?Present on Admission: Yes  ?Dressing Type Foam - Lift dressing to assess site every shift 12/23/21 0800  ? ? ? ?I have Reviewed nursing notes, Vitals, pain scores, I/o's, Lab results and  imaging results since pt's last encounter, details please see discussion above  ?I ordered the following labs:  ?Unresulted Labs (From admission, onward)  ? ?  Start     Ordered  ? 12/24/21 1000  Tacrolimus level  Once,   R       ?Comments: 12h trough - to be drawn just prior to administration of 10:00 dose of med ?  ?Question:  Specimen collection method  Answer:  IV Team=IV Team collect  ? 12/23/21 1135  ? 12/24/21 0500  Renal function panel  Tomorrow morning,   R       ?Question:  Specimen collection method  Answer:  IV Team=IV Team collect  ? 12/23/21 1136  ? ?  ?  ? ?  ? ? ? ?DVT prophylaxis: heparin injection 5,000 Units Start: 12/21/21 0600 ? ? ?Code Status:   Code Status: Full Code ? ?Family Communication: Patient ?Disposition:  ? ? Dispo: The patient is from: Home, lives alone ?             Anticipated d/c is to: CIR versus SNF ?             Anticipated d/c date is: improving, need to have bm, increase activity, then voiding trial ? ?Antimicrobials:   ?Anti-infectives (From admission, onward)  ? ? None  ? ?  ? ? ? ? ? ? ?Objective: ?Vitals:  ? 12/22/21 1710 12/22/21 1937 12/23/21 0416 12/23/21 1349  ?BP: (!) 144/62 (!) 143/64 (!) 156/72 (!) 145/69  ?Pulse: 64 66 65 73  ?Resp: '17 19 19 18  '$ ?Temp: 98 ?F (36.7 ?C) 98.1 ?F (36.7 ?C) 98.1 ?F (36.7 ?C) 97.6 ?F (36.4 ?C)  ?TempSrc: Oral Oral Oral Oral  ?SpO2: 97% 96% 96% 97%  ?Weight:      ?Height:      ? ? ?Intake/Output Summary (Last 24 hours) at 12/23/2021 1855 ?Last data filed at 12/23/2021 1402 ?Gross per 24 hour  ?Intake 600 ml  ?Output 2650 ml  ?Net -2050 ml  ? ?Filed Weights  ? 12/20/21 1909  ?Weight: 68.8 kg  ? ? ?Examination: ? ?General exam: aaox3,  appear weak, + throat/neck edema , + peg tube ?Respiratory system: Clear to auscultation. Respiratory effort normal. ?Cardiovascular system:  RRR.  ?Gastrointestinal system: Abdomen is nondistended, soft and nontender.  Normal bowel sounds heard. ?Central nervous system: Alert and oriented. No focal neurological deficits. ?Extremities:  bilateral lower extremity edema, stump of edema has improved, status post left BKA,  ?Skin: Stage I sacral ulcer present on admission ?Psychiatry: Judgement and insight appear normal. Mood & affect appropriate.  ? ? ? ?Data Reviewed: I have personally reviewed  labs and visualized  imaging studies since the last encounter and formulate the plan  ? ? ? ? ? ? ?Scheduled Meds: ? amLODipine  10 mg Per Tube Daily  ? atorvastatin  40 mg Per Tube QPM  ?  bisacodyl  10 mg Rectal Daily  ? carvedilol  25 mg Per Tube BID  ? Chlorhexidine Gluconate Cloth  6 each Topical Daily  ? cloNIDine  0.1 mg Per Tube BID  ? feeding supplement (NEPRO CARB STEADY)  237 mL Per Tube 5 X Daily  ? free water  60 mL Per Tube 5 X Daily  ? heparin  5,000 Units Subcutaneous Q8H  ? insulin aspart  0-9 Units Subcutaneous Q4H  ? insulin glargine-yfgn  15 Units Subcutaneous Daily  ? multivitamin  1 tablet Oral QHS  ? mycophenolate  250 mg Per Tube BID  ? phosphorus  500 mg Per Tube BID  ? polyethylene glycol  17 g Oral BID  ? predniSONE  5 mg Per Tube Daily  ? senna-docusate  2 tablet Oral BID  ? sodium chloride flush  10-40 mL Intracatheter Q12H  ? tacrolimus  1 mg Oral BID  ? tamsulosin  0.4 mg Oral Daily  ? vitamin B-12  500 mcg Per Tube Daily  ? ?Continuous Infusions: ? ? LOS: 1 day  ? ? ? ? ?Florencia Reasons, MD PhD FACP ?[facility] ? ?Available via Epic secure chat 7am-7pm for nonurgent issues ?Please page for urgent issues ?To page the attending provider between 7A-7P or the covering provider during after hours 7P-7A, please log into the web site [email or web address] and access using universal [place] password for that  web site. If you do not have the password, please call the hospital operator. ? ? ? ?12/23/2021, 6:55 PM  ? ? ?

## 2021-12-24 ENCOUNTER — Inpatient Hospital Stay (HOSPITAL_COMMUNITY): Payer: Medicare Other

## 2021-12-24 ENCOUNTER — Other Ambulatory Visit: Payer: Self-pay

## 2021-12-24 ENCOUNTER — Ambulatory Visit
Admission: RE | Admit: 2021-12-24 | Discharge: 2021-12-24 | Disposition: A | Discharge status: Home / Self Care | Payer: Medicare Other | Source: Ambulatory Visit | Attending: Radiation Oncology | Admitting: Radiation Oncology

## 2021-12-24 ENCOUNTER — Ambulatory Visit: Payer: Medicare Other

## 2021-12-24 DIAGNOSIS — E162 Hypoglycemia, unspecified: Secondary | ICD-10-CM | POA: Diagnosis not present

## 2021-12-24 DIAGNOSIS — N179 Acute kidney failure, unspecified: Secondary | ICD-10-CM | POA: Diagnosis not present

## 2021-12-24 LAB — RENAL FUNCTION PANEL
Albumin: 2.6 g/dL — ABNORMAL LOW (ref 3.5–5.0)
Anion gap: 7 (ref 5–15)
BUN: 77 mg/dL — ABNORMAL HIGH (ref 6–20)
CO2: 30 mmol/L (ref 22–32)
Calcium: 8.1 mg/dL — ABNORMAL LOW (ref 8.9–10.3)
Chloride: 97 mmol/L — ABNORMAL LOW (ref 98–111)
Creatinine, Ser: 1.45 mg/dL — ABNORMAL HIGH (ref 0.61–1.24)
GFR, Estimated: 57 mL/min — ABNORMAL LOW (ref >60)
Glucose, Bld: 116 mg/dL — ABNORMAL HIGH (ref 70–99)
Phosphorus: 1.9 mg/dL — ABNORMAL LOW (ref 2.5–4.6)
Potassium: 3.5 mmol/L (ref 3.5–5.1)
Sodium: 134 mmol/L — ABNORMAL LOW (ref 135–145)

## 2021-12-24 LAB — RAD ONC ARIA SESSION SUMMARY
Course Elapsed Days: 35
Plan Fractions Treated to Date: 7
Plan Prescribed Dose Per Fraction: 2 Gy
Plan Total Fractions Prescribed: 20
Plan Total Prescribed Dose: 40 Gy
Reference Point Dosage Given to Date: 44 Gy
Reference Point Session Dosage Given: 2 Gy
Session Number: 22

## 2021-12-24 LAB — GLUCOSE, CAPILLARY
Glucose-Capillary: 115 mg/dL — ABNORMAL HIGH (ref 70–99)
Glucose-Capillary: 192 mg/dL — ABNORMAL HIGH (ref 70–99)
Glucose-Capillary: 243 mg/dL — ABNORMAL HIGH (ref 70–99)
Glucose-Capillary: 249 mg/dL — ABNORMAL HIGH (ref 70–99)
Glucose-Capillary: 257 mg/dL — ABNORMAL HIGH (ref 70–99)
Glucose-Capillary: 308 mg/dL — ABNORMAL HIGH (ref 70–99)

## 2021-12-24 MED ORDER — PRUTECT EX EMUL
1.0000 "application " | Freq: Two times a day (BID) | CUTANEOUS | Status: DC | PRN
  Administered 2021-12-24: 1 via TOPICAL

## 2021-12-24 MED ORDER — POTASSIUM CHLORIDE 20 MEQ PO PACK
20.0000 meq | PACK | Freq: Once | ORAL | Status: AC
  Administered 2021-12-24: 20 meq
  Filled 2021-12-24: qty 1

## 2021-12-24 MED ORDER — K PHOS MONO-SOD PHOS DI & MONO 155-852-130 MG PO TABS
500.0000 mg | ORAL_TABLET | Freq: Two times a day (BID) | ORAL | Status: AC
  Administered 2021-12-24 (×2): 500 mg
  Filled 2021-12-24 (×2): qty 2

## 2021-12-24 NOTE — Progress Notes (Signed)
?[name] ?Progress Note  ? ?Subjective:   Feeling fine today. Net neg 2.2 L yesterday.  ? ?Objective ?Vitals:  ? 12/23/21 1349 12/23/21 2020 12/24/21 0459 12/24/21 1308  ?BP: (!) 145/69 (!) 150/69 (!) 172/72 (!) 137/55  ?Pulse: 73 71 68 68  ?Resp: '18 18 18 14  '$ ?Temp: 97.6 ?F (36.4 ?C) 98.7 ?F (37.1 ?C) 98.4 ?F (36.9 ?C) 98 ?F (36.7 ?C)  ?TempSrc: Oral Oral Oral Oral  ?SpO2: 97% 97% 100% 99%  ?Weight:      ?Height:      ? ?Physical Exam ?Gen: chronically ill but nontoxic  ?Eyes: anicteric, EOMI ?ENT: poor dentition, MMM, suctioning secretions ?Neck: swelling noted, able to speak ?CV: RRR ?Abd: soft, nontender ?Lungs: clear ?GU: no foley, LLQ transplanted kidney nontender ?Extr: L BKA, R ankle 1+ edema at mid tibia now none where sock is present; needs compression stocking back on ?Neuro: AOx3 fully conversant ?  ? ?Additional Objective ?Labs: ?Basic Metabolic Panel: ?Recent Labs  ?Lab 12/22/21 ?0402 12/23/21 ?0900 12/24/21 ?0426  ?NA 135 137 134*  ?K 3.4* 3.6 3.5  ?CL 100 102 97*  ?CO2 '28 30 30  '$ ?GLUCOSE 270* 190* 116*  ?BUN 89* 85* 77*  ?CREATININE 2.19* 1.70* 1.45*  ?CALCIUM 8.3* 7.9* 8.1*  ?PHOS 1.9* 1.6* 1.9*  ? ? ?Liver Function Tests: ?Recent Labs  ?Lab 12/20/21 ?1929 12/21/21 ?4268 12/22/21 ?0402 12/24/21 ?0426  ?AST 28 26  --   --   ?ALT 17 16  --   --   ?ALKPHOS 110 109  --   --   ?BILITOT 1.2 1.0  --   --   ?PROT 6.9 6.9  --   --   ?ALBUMIN 2.8* 2.8* 2.4* 2.6*  ? ? ?Recent Labs  ?Lab 12/20/21 ?1929  ?LIPASE 21  ? ? ?CBC: ?Recent Labs  ?Lab 12/20/21 ?1929 12/21/21 ?3419 12/22/21 ?0402 12/23/21 ?0900  ?WBC 9.9 14.1* 10.4 9.1  ?NEUTROABS 6.4 10.3* 7.1  --   ?HGB 8.9* 9.2* 8.0* 8.0*  ?HCT 27.1* 28.1* 24.6* 24.5*  ?MCV 87.7 88.1 88.2 88.4  ?PLT 320 341 281 325  ? ? ?Blood Culture ?   ?Component Value Date/Time  ? SDES BLOOD RIGHT WRIST 08/23/2017 1548  ? SPECREQUEST  08/23/2017 1548  ?  BOTTLES DRAWN AEROBIC ONLY Blood Culture adequate volume  ? CULT NO GROWTH 5 DAYS 08/23/2017 1548  ?  REPTSTATUS 08/28/2017 FINAL 08/23/2017 1548  ? ? ?Cardiac Enzymes: ?No results for input(s): CKTOTAL, CKMB, CKMBINDEX, TROPONINI in the last 168 hours. ?CBG: ?Recent Labs  ?Lab 12/23/21 ?2014 12/24/21 ?0000 12/24/21 ?6222 12/24/21 ?9798 12/24/21 ?1153  ?GLUCAP 222* 192* 115* 249* 308*  ? ? ?Iron Studies: No results for input(s): IRON, TIBC, TRANSFERRIN, FERRITIN in the last 72 hours. ?'@lablastinr3''@Medications'$ : ? ? ? amLODipine  10 mg Per Tube Daily  ? atorvastatin  40 mg Per Tube QPM  ? bisacodyl  10 mg Rectal Daily  ? carvedilol  25 mg Per Tube BID  ? Chlorhexidine Gluconate Cloth  6 each Topical Daily  ? cloNIDine  0.1 mg Per Tube BID  ? feeding supplement (NEPRO CARB STEADY)  237 mL Per Tube 5 X Daily  ? free water  60 mL Per Tube 5 X Daily  ? heparin  5,000 Units Subcutaneous Q8H  ? insulin aspart  0-9 Units Subcutaneous Q4H  ? insulin glargine-yfgn  15 Units Subcutaneous Daily  ? multivitamin  1 tablet Oral QHS  ? mycophenolate  250 mg Per Tube  BID  ? phosphorus  500 mg Per Tube BID  ? polyethylene glycol  17 g Oral BID  ? predniSONE  5 mg Per Tube Daily  ? senna-docusate  2 tablet Oral BID  ? sodium chloride flush  10-40 mL Intracatheter Q12H  ? tacrolimus  1 mg Oral BID  ? tamsulosin  0.4 mg Oral Daily  ? vitamin B-12  500 mcg Per Tube Daily  ? ? ? ?Assessment/Plan: Arthur Burke is an 56 y.o. 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  ? ?**Squamous cell ca throat: undergoing XRT but missed a week due to diarrhea and swelling.  Asking to remain admitted until finishes XRT due to burden of travel.  Defer to primary.  ?  ?**ESRD s/p renal transplant - now with baseline CKD 4 (baseline Cr 2-2.2 lately) of transplant and AKI.    UA with  stable mild proteinuria, no LE or nit, rare bacteria - culture pending but no empiric abx given asymptomatic and UA  relatively ok.  FeNa 0.2% though nonoliguric. PVR is normal; had normal transplant [place] 10/2021 - given no LUTs and kidney function fairly stable won't repeat at this time.  Cr stable at 1.7- 2.0 here.  Continue with tube feeds 5x/day, +free water as needed.  Chronic RLE edema is mild now and not reflective of general fluid overload. Can re dose IV lasix 40 qd PRN. No further suggestions at this time, will sign off. ?  ?**Transplant immunosuppression:  on tac 1 BID, cellcept 250 BBID, pred 5 daily.  He was getting liquid compounded tac here last admission, says has been opening his capsules and putting through tube - either way is fine.  ?  ?**HTN/Volume: currently on clonidine 0.1 BID, coreg 25 BID, amlodipine 10 daily.  Off ACEi since last admission.   ?  ?**Anemia:  Hb 8-9s, normocytic. Follow.  ? ?**DM type 1:  hypoglycemic prior to admission; primary managing insulin.   Insulin has been resumed. ? ?Kelly Splinter, MD ?12/24/2021, 4:14 PM ? ? ? ?Recent Labs  ?Lab 12/22/21 ?0402 12/23/21 ?0900 12/24/21 ?0426  ?HGB 8.0* 8.0*  --   ?ALBUMIN 2.4*  --  2.6*  ?CALCIUM 8.3* 7.9* 8.1*  ?PHOS 1.9* 1.6* 1.9*  ?CREATININE 2.19* 1.70* 1.45*  ?K 3.4* 3.6 3.5  ? ? ? ? ?

## 2021-12-24 NOTE — Progress Notes (Signed)
Speech Language Pathology Treatment: Dysphagia  ?Patient Details ?Name: Arthur Burke ?MRN: [id number] ?DOB: 08/24/1965 ?Today's Date: 12/24/2021 ?Time: 9169-4503 ?SLP Time Calculation (min) (ACUTE ONLY): 10 min ? ?Assessment / Plan / Recommendation ?Clinical Impression ? Patient seen by SLP for skilled treatment session focused on dysphagia goals with SLP reviewing swallow exercises which patient had started with OP SLP last month. SLP printed out exercises in large print as patient had reported visual deficit. Patient able to see and read materials but requested print be same size but bold. SLP reviewed and demonstrated each exercise and patient able to return demonstrate with exception of Mendelsohn maneuver ("half-swallow"). SLP recommended patient try to perform exercises throughout the day and patient in agreement. SLP then printed out exercises in bold font and left at bedside. SLP will continue to follow patient for dysphagia treatment. ?  ?HPI HPI: Pt is a 56 y.o. male who was admitted secondary to weakness and dehydration. Imaging 5/11 negative for acute cardiopulmonary findings. 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 He missed his radiation dose this week.  G-tube placed 10/22/21. Baseline MBS 10/26/21: visible tissue abnormality around the valleculae and epiglottis. There is trace residue with liquids and solids in the vallecular space after the swallow. Otherwise the hyolaryngeal mechanism is strong and timely. Pt clears any solids well with a liquid wash. 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 Pt seen by outpatient SLP  on 11/29/21 and HEP initiated. ?  ?   ?SLP Plan ? Continue with current plan of care ? ?  ?  ?Recommendations for follow up therapy are one component of a multi-disciplinary discharge planning process, led by the attending physician.  Recommendations may be updated based on patient status, additional functional criteria and insurance authorization. ?  ? ?Recommendations  ?Diet recommendations: Thin liquid ?Medication Administration: Via alternative means ?Compensations: Slow rate;Small sips/bites;Clear throat after each swallow  ?   ?    ?   ? ? ? ? Oral Care Recommendations: Oral care QID;Oral care prior to ice chip/H20 ?Follow Up Recommendations: Acute inpatient rehab (3hours/day) ?Assistance recommended at discharge: Set up Supervision/Assistance ?SLP Visit Diagnosis: Dysphagia, unspecified (R13.10) ?Plan: Continue with current plan of care ? ? ? ? ?  ?  ? ? ?Sonia Baller, MA, CCC-SLP ?Speech Therapy ? ?

## 2021-12-24 NOTE — Progress Notes (Signed)
Inpatient Rehab Admissions Coordinator:  ? ?Consult received, chart reviewed, and discussed with rehab medical director Dr. Naaman Plummer.  Feel that pt likely needs increased support at home, which does not appear available to him.  Would recommend alternative rehab venues be pursued.  Will sign off at this time.  ? ?Shann Medal, PT, DPT ?Admissions Coordinator ?[phone] ?12/24/21  ?4:39 PM ? ?

## 2021-12-24 NOTE — Progress Notes (Signed)
Physical Therapy Treatment ?Patient Details ?Name: Arthur Burke ?MRN: [id number] ?DOB: September 05, 1965 ?Today's Date: 12/24/2021 ? ? ?History of Present Illness Patient is 56 y.o. male admitted for PMH significant for DM, HTN, impaired vision, Lt LE ulcer s/p Lt BKA in 2019, renal disorder s/p kidney transplant in 2006. Pt presents to ED for weakness and dehydration. 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  Has had increased swelling of throat this week and took furosemide without improvement.  Also had diarrhea after having constipation and taking laxatives. Patient has a PEG tube through which he gets most of his nutrition. Pt hypoglycemic in ED and admitted for managment of AKI. ? ?  ?PT Comments  ? ? Patient making good progress with mobility but continues to require Min assist with RW to steady during gait. He ambulated ~400' with RW and min assist and at EOS completed exercises for Hip/knee strength. Patient remains highly motivated and hopeful to maximize rehab in intense AIR setting. Medical concerns and treatments will likely play a role in determining if he is a candidate. Anticipate pt would be able to progress to safe level of not requiring 24/7 assist but rather intermittent assist with intense rehab. Will continue to progress as able in acute setting. ?  ?Recommendations for follow up therapy are one component of a multi-disciplinary discharge planning process, led by the attending physician.  Recommendations may be updated based on patient status, additional functional criteria and insurance authorization. ?PT Recommendation   ?Recommendations for Other Services Rehab consult Filed 12/22/2021 1000  ?Follow Up Recommendations Acute inpatient rehab (3hours/day) Filed 12/24/2021 1114  ?Assistance recommended at discharge Intermittent Supervision/Assistance Filed 12/24/2021 1114  ?Patient can return home with the following A little help with walking and/or transfers, A little help with  bathing/dressing/bathroom, Assistance with cooking/housework, Direct supervision/assist for medications management, Help with stairs or ramp for entrance, Assist for transportation Filed 12/24/2021 1114  ?Functional Status Assessment Patient has had a recent decline in their functional status and demonstrates the ability to make significant improvements in function in a reasonable and predictable amount of time. Filed 12/22/2021 1000  ?PT equipment None recommended by PT Filed 12/24/2021 1114  ? ? ? ?Mobility ? Bed Mobility ?Overal bed mobility: Modified Independent ?  ?  ?  ?  ?  ?  ?  ?  ? ?Transfers ?Overall transfer level: Needs assistance ?Equipment used: Rolling walker (2 wheels) ?Transfers: Sit to/from Stand ?Sit to Stand: Supervision, Min guard ?  ?  ?  ?  ?  ?General transfer comment: guard/supervision fo safety to rise to RW. pt steady once standing ?  ? ?Ambulation/Gait ?Ambulation/Gait assistance: Min guard ?Gait Distance (Feet): 400 Feet ?Assistive device: Rolling walker (2 wheels) ?Gait Pattern/deviations: Step-through pattern, Decreased stride length, Decreased weight shift to left ?Gait velocity: decr ?  ?  ?General Gait Details: pt required min assist to steady due to Rt quad and hip flexor weakness leading to flexed LE in stance phase. pt heavily reliant on RW for support to stabilize self. ? ? ?Stairs ?  ?  ?  ?  ?  ? ? ?Wheelchair Mobility ?  ? ?Modified Rankin (Stroke Patients Only) ?  ? ? ?  ?Balance Overall balance assessment: Needs assistance ?Sitting-balance support: Feet supported ?Sitting balance-Leahy Scale: Good ?  ?  ?Standing balance support: During functional activity, Reliant on assistive device for balance, Bilateral upper extremity supported ?Standing balance-Leahy Scale: Fair ?  ?  ?  ?  ?  ?  ?  ?  ?  ?  ?  ?  ?  ? ?  ?  Cognition Arousal/Alertness: Awake/alert ?Behavior During Therapy: [facility] for tasks assessed/performed ?Overall Cognitive Status: Within Functional Limits for tasks  assessed ?  ?  ?  ?  ?  ?  ?  ?  ?  ?  ?  ?  ?  ?  ?  ?  ?  ?  ?  ? ?  ?Exercises General Exercises - Lower Extremity ?Long Arc Quad: AROM, Both, 10 reps ?Hip Flexion/Marching: AROM, Both, 10 reps ? ?  ?General Comments   ?  ?  ? ?Pertinent Vitals/Pain Pain Assessment ?Pain Assessment: Faces ?Faces Pain Scale: Hurts a little bit ?Pain Location: Lt knee ?Pain Descriptors / Indicators: Discomfort, Grimacing, Guarding ?Pain Intervention(s): Limited activity within patient's tolerance, Monitored during session, Repositioned  ? ? ? ?PT - End of Session  ?Equipment Utilized During Treatment Gait belt  ?Activity Tolerance Patient tolerated treatment well  ?Patient left in chair;with call bell/phone within reach;with chair alarm set  ?Nurse Communication Mobility status  ? PT - Assessment/Plan  ?PT Plan Current plan remains appropriate  ?PT Visit Diagnosis Unsteadiness on feet (R26.81);Muscle weakness (generalized) (M62.81);Difficulty in walking, not elsewhere classified (R26.2)  ?PT Frequency (ACUTE ONLY) Min 3X/week  ?Follow Up Recommendations Acute inpatient rehab (3hours/day)  ?Assistance recommended at discharge Intermittent Supervision/Assistance  ?Patient can return home with the following A little help with walking and/or transfers;A little help with bathing/dressing/bathroom;Assistance with cooking/housework;Direct supervision/assist for medications management;Help with stairs or ramp for entrance;Assist for transportation  ?PT equipment None recommended by PT  ?AM-PAC PT "6 Clicks" Mobility Outcome Measure (Version 2)  ?Help needed turning from your back to your side while in a flat bed without using bedrails? 4  ?Help needed moving from lying on your back to sitting on the side of a flat bed without using bedrails? 4  ?Help needed moving to and from a bed to a chair (including a wheelchair)? 3  ?Help needed standing up from a chair using your arms (e.g., wheelchair or bedside chair)? 3  ?Help needed to walk in  hospital room? 3  ?Help needed climbing 3-5 steps with a railing?  2  ?6 Click Score 19  ?Consider Recommendation of Discharge To: Home with HH  ?Progressive Mobility  ?What is the highest level of mobility based on the progressive mobility assessment? Level 5 (Walks with assist in room/hall) - Balance while stepping forward/back and can walk in room with assist - Complete  ?PT Goal Progression  ?Progress towards PT goals Progressing toward goals  ?Acute Rehab PT Goals  ?PT Goal Formulation With patient  ?Time For Goal Achievement 01/05/22  ?Potential to Achieve Goals Good  ?PT Time Calculation  ?PT Start Time (ACUTE ONLY) 1053  ?PT Stop Time (ACUTE ONLY) 1122  ?PT Time Calculation (min) (ACUTE ONLY) 29 min  ?PT General Charges  ?$$ ACUTE PT VISIT 1 Visit  ?PT Treatments  ?$Gait Training 8-22 mins  ?$Therapeutic Exercise 8-22 mins  ? ? ?Gwynneth Albright PT, DPT ?Acute Rehabilitation Services ?Office [phone] ?Pager [phone] ? ?12/24/21 4:56 PM  ? ?Arthur Burke ?12/24/2021, 4:52 PM ? ?

## 2021-12-24 NOTE — Progress Notes (Signed)
?PROGRESS NOTE ?  ?Arthur Burke  GBE:[id number]    DOB: 1966/02/21    DOA: 12/20/2021 ? ?PCP: Pcp, No  ? ?I have briefly reviewed patients previous medical records in [facility]. ? ?Chief Complaint  ?Patient presents with  ? Diarrhea  ? Weakness  ? ? ?Brief Narrative:  ?56 year old male, reports that he lives alone, used to be independent using his left lower extremity prosthesis but of late moves around with the help of a wheelchair, medical history significant for type I DM with visual impairment, ESRD due to DM 1, s/p renal transplant in 2005 and on immunosuppression, left BKA, recently diagnosed with squamous cell CA of throat and getting XRT, having increased difficulty with increased swelling around his throat area and started taking Lasix back despite which the swelling persisted, has PEG tube, presented to the ED with complaints of weakness and dehydration.  In the ED found to be hypoglycemic with blood sugars in the 40s.  Admitted for hypoglycemia, failure to thrive, acute on chronic kidney disease.  Nephrology consulted.  Improved.  Appears medically optimized for DC and awaiting safe and appropriate disposition.  CIR declined due to lack of appropriate home support upon discharge and recommend SNF.  Per TOC, SNF unable to transport patient daily for XRT treatments.  Patient may land up having to return home. ? ? ?Assessment & Plan:  ?Principal Problem: ?  Hypoglycemia ?Active Problems: ?  History of kidney transplant ?  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  S/P BKA (below knee amputation) unilateral, left ([place]) ?  Benign essential HTN ?  Essential hypertension ?  Dehydration ?  Oropharyngeal cancer ([place]) ?  Pressure injury of skin ? ? ?Type I DM with hypoglycemia, POA/hyperglycemia: Suspected due to inconsistent nutrition in the setting of progressive renal failure.  Patient reportedly unable to tolerate tube feeds 5 times a day at home and has been doing it 2-3  times a day.  Hypoglycemia resolved.  Now hyperglycemic.  Currently on Lantus 15 units daily, consider uptitrating in AM. ? ?ESRD s/p renal transplant, now AKI complicating baseline CKD 4 of transplanted kidney: Baseline creatinine 2-2.2 lately.  Fena 0.2% though nonoliguric.  Nephrology consulted and assisted with care.  PVR normal.  Had normal ultrasound and 3/23 and given no LUTS and kidney functions fairly stable, did not repeat.  Creatinine has improved to 1.45.  Continue tube feeds and free water as needed.  They indicate that we can redose IV Lasix 40 mg daily as needed for chronic right lower extremity edema which is mild now.  Nephrology signed off.  Continue tacrolimus, CellCept and prednisone.  Outpatient follow-up with transplant team. ? ?Hypertension: Continue clonidine carvedilol and amlodipine.  Off of ACEI since last admission, avoid ACEI or ARB's. ? ?Right lower extremity edema: Suspecting due to poor nutrition/hypoalbuminemia, venous Doppler negative for DVT.  Compression stocking.  Did receive IV Lasix with albumin.  Edema minimal.  May use IV Lasix as needed per nephrology. ? ?Acute urinary retention: Foley catheter inserted on 5/12.  Flomax started.  Avoid constipation.  Discontinued Foley catheter 5/15 and doing voiding trial. ? ?Hypokalemia: Replace and follow ? ?Hypophosphatemia replace and follow ? ?Squamous cell carcinoma of the throat: Getting XRT 5 days a week.  Patient reports that he is on 21/35 treatments.  Getting XRT for throat lymphedema.  Nutrition and meds per PEG.  Missed XRT for a week due to failure to thrive. ? ?Dysphagia: SLP  input 5/15 appreciated and recommend thin liquids. ? ?Anemia of chronic disease: Hemoglobin stable in the 8 g range. ? ?S/p left BKA: Reportedly not able to use prosthesis due to stump edema recently. ? ?Chronic back pain: Continue pain regimen.  Follows with [facility]. ? ?Adult failure to thrive: Reportedly rather acute.  Independent at home  until recently when he became wheelchair bound and developed stage I sacral ulcer and missed XRT for a week.  PT evaluated and recommended CIR but CIR does not feel that he is appropriate since does not have adequate support to return home, recommend SNF but SNF unable to transport him daily to XRT and hence patient may have to return home with family support.  This as per extensive communication all day today with therapies team, rehab team and TOC. ? ?Body mass index is 21.16 kg/m?. ? ?Nutritional Status ?Nutrition Problem: Inadequate oral intake ?Etiology: inability to eat ?Signs/Symptoms: NPO status ?Interventions: Tube feeding, MVI, Refer to RD note for recommendations ? ?Pressure Ulcer: ?Pressure Injury 12/20/21 Coccyx Bilateral;Medial Stage 1 -  Intact skin with non-blanchable redness of a localized area usually over a bony prominence. Reddened non blanchable area to coccyx and bilat buttocks (Active)  ?12/20/21 2330  ?Location: Coccyx  ?Location Orientation: Bilateral;Medial  ?Staging: Stage 1 -  Intact skin with non-blanchable redness of a localized area usually over a bony prominence.  ?Wound Description (Comments): Reddened non blanchable area to coccyx and bilat buttocks  ?Present on Admission: Yes  ?Dressing Type Foam - Lift dressing to assess site every shift 12/24/21 0818  ? ? ? ?DVT prophylaxis: heparin injection 5,000 Units Start: 12/21/21 0600   ?  Code Status: Full Code:  ?Family Communication: None at bedside. ?Disposition:  ?Status is: Inpatient ? ?Appears medically optimized for DC pending safe dispo.?  Home 5/16. ?  ? ? ?Consultants:   ?Nephrology ?Rehab MD ? ?Procedures:   ?Foley catheter, discontinued 5/15 ? ?Antimicrobials:   ?None. ? ? ?Subjective:  ?Overall feels better.  Dry mouth.  Chronic pain at baseline.  No other complaints reported.  Denies Foley catheter PTA or difficulty urinating PTA.  States that he just did not have much urine to put out on admission. ? ?Objective:  ? ?Vitals:   ? 12/23/21 1349 12/23/21 2020 12/24/21 0459 12/24/21 1308  ?BP: (!) 145/69 (!) 150/69 (!) 172/72 (!) 137/55  ?Pulse: 73 71 68 68  ?Resp: '18 18 18 14  '$ ?Temp: 97.6 ?F (36.4 ?C) 98.7 ?F (37.1 ?C) 98.4 ?F (36.9 ?C) 98 ?F (36.7 ?C)  ?TempSrc: Oral Oral Oral Oral  ?SpO2: 97% 97% 100% 99%  ?Weight:      ?Height:      ? ? ?General exam: Young male, moderately built and nourished sitting up comfortably in bed without distress. ?Respiratory system: Clear to auscultation. Respiratory effort normal.  Port-A-Cath right upper anterior chest. ?Cardiovascular system: S1 & S2 heard, RRR. No JVD, murmurs, rubs, gallops or clicks. No pedal edema.  Telemetry personally reviewed: Sinus rhythm. ?Gastrointestinal system: Abdomen is nondistended, soft and nontender. No organomegaly or masses felt. Normal bowel sounds heard.  PEG tube in place without acute findings.  Suprapubic midline surgical scar. ?GU: Still had Foley catheter in place this morning. ?Central nervous system: Alert and oriented. No focal neurological deficits. ?Extremities: Symmetric 5 x 5 power.  Left BKA healed stump. ?Skin: No rashes, lesions or ulcers ?Psychiatry: Judgement and insight appear normal. Mood & affect appropriate.  ? ? ? ?Data Reviewed:   ?  I have personally reviewed following labs and imaging studies ? ? ?CBC: ?Recent Labs  ?Lab 12/20/21 ?1929 12/21/21 ?9417 12/22/21 ?0402 12/23/21 ?0900  ?WBC 9.9 14.1* 10.4 9.1  ?NEUTROABS 6.4 10.3* 7.1  --   ?HGB 8.9* 9.2* 8.0* 8.0*  ?HCT 27.1* 28.1* 24.6* 24.5*  ?MCV 87.7 88.1 88.2 88.4  ?PLT 320 341 281 325  ? ? ?Basic Metabolic Panel: ?Recent Labs  ?Lab 12/21/21 ?2101 12/21/21 ?2228 12/22/21 ?0402 12/23/21 ?0900 12/24/21 ?0426  ?NA 134* 134* 135 137 134*  ?K 3.8 3.5 3.4* 3.6 3.5  ?CL 99 101 100 102 97*  ?CO2 '28 28 28 30 30  '$ ?GLUCOSE 470* 423* 270* 190* 116*  ?BUN 87* 97* 89* 85* 77*  ?CREATININE 2.33* 2.17* 2.19* 1.70* 1.45*  ?CALCIUM 7.9* 8.1* 8.3* 7.9* 8.1*  ?MG  --   --   --  1.9  --   ?PHOS  --   --  1.9* 1.6*  1.9*  ? ? ?Liver Function Tests: ?Recent Labs  ?Lab 12/20/21 ?1929 12/21/21 ?4081 12/22/21 ?0402 12/24/21 ?0426  ?AST 28 26  --   --   ?ALT 17 16  --   --   ?ALKPHOS 110 109  --   --   ?BILITOT 1.2 1.0  --

## 2021-12-24 NOTE — TOC Progression Note (Signed)
Transition of Care (TOC) - Progression Note  ? ? ?Patient Details  ?Name: Arthur Burke ?MRN: [id number] ?Date of Birth: 12/07/1965 ? ?Transition of Care (TOC) CM/SW Contact  ?Purcell Mouton, RN ?Phone Number: ?12/24/2021, 2:35 PM ? ?Clinical Narrative:    ?Spoke with pt's sister in law. There is no 24/7 care. Pt was living alone, brother would help when he could. Pt and brother are also saying they just want the pt to get stronger before going home with HHPT.  ? ? ?Expected Discharge Plan: [place] ?Barriers to Discharge: No Barriers Identified ? ?Expected Discharge Plan and Services ?Expected Discharge Plan: [place] ?  ?  ?Post Acute Care Choice: Home Health ?Living arrangements for the past 2 months: Apartment ?                ?  ?  ?  ?  ?  ?  ?  ?  ?  ?  ? ? ?Social Determinants of Health (SDOH) Interventions ?  ? ?Readmission Risk Interventions ?   ? View : No data to display.  ?  ?  ?  ? ? ?

## 2021-12-24 NOTE — Progress Notes (Signed)
Modified Barium Swallow Progress Note ? ?Patient Details  ?Name: Arthur Burke ?MRN: [id number] ?Date of Birth: [birth year] ? ?Today's Date: 12/24/2021 ? ?Modified Barium Swallow completed.  Full report located under Chart Review in the Imaging Section. ? ?Brief recommendations include the following: ? ?Clinical Impression ? Patient presents with a moderately impaired pharyngeal phase dysphagia but with oral phase appearing WFL. As compared to [facility] on 10/26/21 which was completed prior to chemoradiation treatment, patient has had a significant decline in swallow function. Prior to starting today's MBS, patient in chair in radiology suite telling SLP about his recent swallow function. He reports that he tolerates drinks such as water, but with sodas as well as drinks that have a stronger flavor, he feels burning sensation and discomfort when swallowing. He has not had many solids at all but will eat small amount of pudding to help his medications go down. SLP tested patient with thin liquid barium, nectar thick liquid barium and puree/pudding thick barium. With thin liquid, patient had delay in swallow initiation to level of vallecular sinus, mild vallecular and pyriform sinus residuals and sensed penetration above vocal cords (PAS 2). When drinking thin liquid barium with puree residuals still in vallecular and pyriform sinuses, patient exhibited penetration of thin liquids to the vocal cords (PAS 5) and silent aspiration of trace amount thin liquids after the the swallow (PAS 8). Patient's throat clear was consistently successful in clearing penetrates but unable to clear aspirate. Chin tuck posture, head turned right, head turned left all unsuccessful at reducing penetration or pharyngeal residuals. Puree and nectar thick liquids but resulted in increased pharyngeal residuals. SLP is recommending patient consume liquids only to decrease aspiration risk and allow for him to continue using his swallowing musculature.  Medications should be taken via PEG. ?  ?Swallow Evaluation Recommendations ? ?   ? ? SLP Diet Recommendations: Thin liquid ? ? Liquid Administration via: Cup;Straw ? ? Medication Administration: Via alternative means ? ? Supervision: Patient able to self feed;Intermittent supervision to cue for compensatory strategies ? ? Compensations: Slow rate;Small sips/bites;Clear throat after each swallow ? ? Postural Changes: Seated upright at 90 degrees ? ? Oral Care Recommendations: Oral care BID ? ?   ? ? ? ?Sonia Baller, MA, CCC-SLP ?Speech Therapy ? ?

## 2021-12-25 ENCOUNTER — Encounter: Payer: Medicare Other | Admitting: Nutrition

## 2021-12-25 ENCOUNTER — Other Ambulatory Visit: Payer: Self-pay

## 2021-12-25 ENCOUNTER — Ambulatory Visit
Admission: RE | Admit: 2021-12-25 | Discharge: 2021-12-25 | Disposition: A | Discharge status: Home / Self Care | Payer: Medicare Other | Source: Ambulatory Visit | Attending: Radiation Oncology | Admitting: Radiation Oncology

## 2021-12-25 DIAGNOSIS — Z940 Kidney transplant status: Secondary | ICD-10-CM | POA: Diagnosis not present

## 2021-12-25 DIAGNOSIS — C090 Malignant neoplasm of tonsillar fossa: Secondary | ICD-10-CM

## 2021-12-25 DIAGNOSIS — R197 Diarrhea, unspecified: Secondary | ICD-10-CM | POA: Diagnosis not present

## 2021-12-25 DIAGNOSIS — E162 Hypoglycemia, unspecified: Secondary | ICD-10-CM | POA: Diagnosis not present

## 2021-12-25 LAB — TYPE AND SCREEN
ABO/RH(D): O POS
Antibody Screen: NEGATIVE

## 2021-12-25 LAB — RAD ONC ARIA SESSION SUMMARY
Course Elapsed Days: 36
Plan Fractions Treated to Date: 8
Plan Prescribed Dose Per Fraction: 2 Gy
Plan Total Fractions Prescribed: 20
Plan Total Prescribed Dose: 40 Gy
Reference Point Dosage Given to Date: 46 Gy
Reference Point Session Dosage Given: 2 Gy
Session Number: 23

## 2021-12-25 LAB — BASIC METABOLIC PANEL
Anion gap: 5 (ref 5–15)
BUN: 80 mg/dL — ABNORMAL HIGH (ref 6–20)
CO2: 33 mmol/L — ABNORMAL HIGH (ref 22–32)
Calcium: 8.2 mg/dL — ABNORMAL LOW (ref 8.9–10.3)
Chloride: 100 mmol/L (ref 98–111)
Creatinine, Ser: 1.36 mg/dL — ABNORMAL HIGH (ref 0.61–1.24)
GFR, Estimated: >60 mL/min (ref >60)
Glucose, Bld: 157 mg/dL — ABNORMAL HIGH (ref 70–99)
Potassium: 4 mmol/L (ref 3.5–5.1)
Sodium: 138 mmol/L (ref 135–145)

## 2021-12-25 LAB — CBC
HCT: 23.3 % — ABNORMAL LOW (ref 39.0–52.0)
HCT: 25.9 % — ABNORMAL LOW (ref 39.0–52.0)
Hemoglobin: 7.7 g/dL — ABNORMAL LOW (ref 13.0–17.0)
Hemoglobin: 8.5 g/dL — ABNORMAL LOW (ref 13.0–17.0)
MCH: 29.3 pg (ref 26.0–34.0)
MCH: 29.4 pg (ref 26.0–34.0)
MCHC: 33 g/dL (ref 30.0–36.0)
MCHC: 32.8 g/dL (ref 30.0–36.0)
MCV: 88.6 fL (ref 80.0–100.0)
MCV: 89.6 fL (ref 80.0–100.0)
Platelets: 272 10*3/uL (ref 150–400)
Platelets: 300 10*3/uL (ref 150–400)
RBC: 2.63 MIL/uL — ABNORMAL LOW (ref 4.22–5.81)
RBC: 2.89 MIL/uL — ABNORMAL LOW (ref 4.22–5.81)
RDW: 16.6 % — ABNORMAL HIGH (ref 11.5–15.5)
RDW: 16.6 % — ABNORMAL HIGH (ref 11.5–15.5)
WBC: 7.3 10*3/uL (ref 4.0–10.5)
WBC: 9.9 10*3/uL (ref 4.0–10.5)
nRBC: 0 % (ref 0.0–0.2)
nRBC: 0 % (ref 0.0–0.2)

## 2021-12-25 LAB — GLUCOSE, CAPILLARY
Glucose-Capillary: 146 mg/dL — ABNORMAL HIGH (ref 70–99)
Glucose-Capillary: 228 mg/dL — ABNORMAL HIGH (ref 70–99)
Glucose-Capillary: 235 mg/dL — ABNORMAL HIGH (ref 70–99)
Glucose-Capillary: 367 mg/dL — ABNORMAL HIGH (ref 70–99)

## 2021-12-25 LAB — PHOSPHORUS: Phosphorus: 2.3 mg/dL — ABNORMAL LOW (ref 2.5–4.6)

## 2021-12-25 LAB — MAGNESIUM: Magnesium: 1.9 mg/dL (ref 1.7–2.4)

## 2021-12-25 MED ORDER — NEPRO/CARBSTEADY PO LIQD: 237.0000 mL | Freq: Every day | ORAL | Status: DC

## 2021-12-25 MED ORDER — INSULIN GLARGINE 100 UNITS/ML SOLOSTAR PEN: 22.0000 [IU] | PEN_INJECTOR | Freq: Every day | SUBCUTANEOUS | Status: DC

## 2021-12-25 MED ORDER — TAMSULOSIN HCL 0.4 MG PO CAPS: 0.4000 mg | ORAL_CAPSULE | Freq: Every day | ORAL | Status: DC

## 2021-12-25 MED ORDER — OMEPRAZOLE 40 MG PO CPDR: 40.0000 mg | DELAYED_RELEASE_CAPSULE | Freq: Every day | ORAL | Status: DC

## 2021-12-25 MED ORDER — ONDANSETRON HCL 4 MG PO TABS: 4.0000 mg | ORAL_TABLET | Freq: Two times a day (BID) | ORAL | Status: DC | PRN

## 2021-12-25 MED ORDER — K PHOS MONO-SOD PHOS DI & MONO 155-852-130 MG PO TABS
500.0000 mg | ORAL_TABLET | Freq: Two times a day (BID) | ORAL | Status: DC
  Administered 2021-12-25: 500 mg
  Filled 2021-12-25: qty 2

## 2021-12-25 MED ORDER — PRUTECT EX EMUL: 1.0000 "application " | Freq: Two times a day (BID) | CUTANEOUS | Status: DC | PRN

## 2021-12-25 MED ORDER — OXYCODONE HCL 15 MG PO TABS: 15.0000 mg | ORAL_TABLET | ORAL | 0 refills | Status: AC

## 2021-12-25 MED ORDER — LACTULOSE 10 GM/15ML PO SOLN: 10.0000 g | Freq: Every day | ORAL | Status: DC | PRN

## 2021-12-25 MED ORDER — HUMALOG KWIKPEN 100 UNIT/ML ~~LOC~~ SOPN: 0.0000 [IU] | PEN_INJECTOR | Freq: Three times a day (TID) | SUBCUTANEOUS | Status: DC

## 2021-12-25 MED ORDER — K PHOS MONO-SOD PHOS DI & MONO 155-852-130 MG PO TABS: 500.0000 mg | ORAL_TABLET | Freq: Two times a day (BID) | ORAL | 0 refills | Status: AC

## 2021-12-25 MED ORDER — MYCOPHENOLATE MOFETIL 200 MG/ML PO SUSR: 250.0000 mg | Freq: Two times a day (BID) | ORAL | Status: DC

## 2021-12-25 MED ORDER — HEPARIN SOD (PORK) LOCK FLUSH 100 UNIT/ML IV SOLN
500.0000 [IU] | INTRAVENOUS | Status: AC | PRN
  Administered 2021-12-25: 500 [IU]

## 2021-12-25 MED ORDER — FREE WATER: 60.0000 mL | Freq: Every day | Status: DC

## 2021-12-25 NOTE — Plan of Care (Signed)
Port deaccessed by IV team. DC paperwork reviewed and given to pt and family. Educated to give nurse at [place] paperwork. Family given signed oxy script. [place] [facility] given report. ? ? ? ?Problem: Education: ?Goal: Knowledge of General Education information will improve ?Description: Including pain rating scale, medication(s)/side effects and non-pharmacologic comfort measures ?Outcome: Completed/Met ?  ?Problem: Health Behavior/Discharge Planning: ?Goal: Ability to manage health-related needs will improve ?Outcome: Completed/Met ?  ?Problem: Clinical Measurements: ?Goal: Ability to maintain clinical measurements within normal limits will improve ?Outcome: Completed/Met ?Goal: Will remain free from infection ?Outcome: Completed/Met ?Goal: Diagnostic test results will improve ?Outcome: Completed/Met ?Goal: Respiratory complications will improve ?Outcome: Completed/Met ?Goal: Cardiovascular complication will be avoided ?Outcome: Completed/Met ?  ?Problem: Activity: ?Goal: Risk for activity intolerance will decrease ?Outcome: Completed/Met ?  ?Problem: Nutrition: ?Goal: Adequate nutrition will be maintained ?Outcome: Completed/Met ?  ?Problem: Coping: ?Goal: Level of anxiety will decrease ?Outcome: Completed/Met ?  ?Problem: Elimination: ?Goal: Will not experience complications related to bowel motility ?Outcome: Completed/Met ?Goal: Will not experience complications related to urinary retention ?Outcome: Completed/Met ?  ?Problem: Pain Managment: ?Goal: General experience of comfort will improve ?Outcome: Completed/Met ?  ?Problem: Safety: ?Goal: Ability to remain free from injury will improve ?Outcome: Completed/Met ?  ?Problem: Skin Integrity: ?Goal: Risk for impaired skin integrity will decrease ?Outcome: Completed/Met ?  ?

## 2021-12-25 NOTE — Care Management Important Message (Signed)
Important Message ? ?Patient Details IM Letter given to the Patient. ?Name: TOBENNA NEEDS ?MRN: [id number] ?Date of Birth: [birth year] ? ? ?Medicare Important Message Given:  Yes ? ? ? ? ?Kerin Salen ?12/25/2021, 12:37 PM ?

## 2021-12-25 NOTE — Discharge Instructions (Signed)

## 2021-12-25 NOTE — Discharge Summary (Addendum)
Physician Discharge Summary  ?Arthur Burke LTJ:[id number] DOB: 06-02-1966 ? ?PCP: Pcp, No ? ?Admitted from: Home ?Discharged to: Home ? ?Admit date: 12/20/2021 ?Discharge date: 12/25/2021 ? ?Recommendations for Outpatient Follow-up:  ? ? Follow-up Information   ? ? MD at SNF. Schedule an appointment as soon as possible for a visit.   ?Why: To be seen in 2 to 3 days with repeat labs (CBC, BMP, magnesium and phosphorus).  Monitor CBGs closely and adjust insulins as needed.  Recommend patient follow-up outpatient with his transplant nephrologist. ? ?  ?  ? ? Eppie Gibson, MD Follow up.   ?Specialty: Radiation Oncology ?Why: Continue daily radiation treatments at the [facility] cancer center. ?Contact information: ?[address] ?[place] [address] ?[phone] ? ? ?  ?  ? ? Rocco Serene, MD. Schedule an appointment as soon as possible for a visit.   ?Specialty: Nephrology ?Contact information: ?[facility] [address] ?[place] [address] ?[phone] ? ? ?  ?  ? ?  ?  ? ?  ? ? ? ?Home Health: None ?  ? ?Equipment/Devices: TBD at SNF ?  ? ?Discharge Condition: Improved and stable. ?  Code Status: Full Code ?Diet recommendation:  ?Discharge Diet Orders (From admission, onward)  ? ?  Start     Ordered  ? 12/25/21 0000  Diet full liquid       ?Comments: Diet full liquid; Fluid consistency: Thin  ?Comments: NO solids (no applesauce/pudding/grits/oatmeal)  ?Jello and ice cream are OK ?Meds via PEG  ? 12/25/21 1425  ? ?  ?  ? ?  ?  ? ?Discharge Diagnoses:  ?Principal Problem: ?  Hypoglycemia ?Active Problems: ?  History of kidney transplant ?  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  S/P BKA (below knee amputation) unilateral, left ([place]) ?  Benign essential HTN ?  Essential hypertension ?  Dehydration ?  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  Pressure injury of skin ? ? 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  Had increased swelling of throat 1 week of admission and took furosemide without improvement.  Also had diarrhea after having constipation and taking laxatives.  She did not make it to XRT that week due to diarrhea and weakness.  She also noted swelling of his left lower extremity stump and was unable to put on his prosthesis.  In the ED found to be hypoglycemic with blood sugars in the 40s. Admitted for hypoglycemia, failure to thrive, acute on chronic kidney disease.  Nephrology consulted.  Improved.    ?  ?Assessment & Plan:  ?  ?Type I DM with hypoglycemia, POA/hyperglycemia: Suspected due to inconsistent nutrition in the setting of progressive renal failure.  Patient reportedly unable to tolerate tube feeds 5 times a day at home and had been doing it 2-3 times a day.  Hypoglycemia resolved.  Now hyperglycemic as noted below.  Was on reduced dose of Lantus 15 units daily in the hospital but will resume prior home dose of 22 units daily.  Resume PTA Humalog but at sensitive scale rather than very sensitive scale.  Monitor CBGs closely and adjust insulins as needed at SNF. ? 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  Fena 0.2% though nonoliguric.  Nephrology consulted and assisted with care.  PVR normal.  Had normal ultrasound on 3/23 and given no LUTS and kidney functions fairly stable, did not repeat.  Creatinine has improved to 1.36.  Continue tube feeds and free water as needed.  They indicate that we can redose Lasix 40 mg daily as  needed for chronic right lower extremity edema which is mild now.  Nephrology signed off.  Continue tacrolimus, CellCept and prednisone.  Outpatient follow-up with transplant team.  Close follow-up and BMP at SNF. ? ?Hypertension: Continue clonidine, carvedilol and amlodipine.  Off of ACEI since last admission, avoid ACEI or ARB's. ?  ?Right lower extremity edema: Suspecting due to poor nutrition/hypoalbuminemia, venous Doppler negative for DVT.  Compression stocking.  Did receive IV Lasix with albumin.  Edema minimal/trace.  May use as needed Lasix p.o. ? ?Acute urinary retention: Foley catheter inserted on 5/12.  Flomax started.  Avoid constipation.  Discontinued Foley catheter 5/15 and voiding without difficulty. ? ?Hypokalemia: Replaced.  Magnesium 1.9. ? ?Hypophosphatemia: Continue to replace and follow outpatient. ? ?Squamous cell carcinoma of the throat: Getting XRT 5 days a week.  Patient reports that he is on 22/35 treatments.  Getting XRT for throat lymphedema.  Nutrition and meds per PEG.  Missed XRT for a week due to failure to thrive.  As per report, patient will be going to SNF and patient's brother will be transporting him from SNF to XRT daily on weekdays. ?  ?Dysphagia: SLP input 5/15 appreciated and recommend thin liquids. ?  ?Anemia of chronic disease: Hemoglobin was stable in the 8 g range but had dropped to 7.7 this morning in the absence of overt bleeding.  This was likely a lab error.  Repeated CBC and hemoglobin 8.5.  Follow CBCs closely at SNF and transfuse if hemoglobin 7 g or less. ? ?S/p left BKA: Reportedly not able to use prosthesis due to stump edema recently.  Edema has resolved and he had a stump on this morning. ? ?Chronic back pain: Continue pain regimen.  Follows with [facility] pain [facility].  Reviewed [place] PDMP and has been consistently filling Oxy IR prescriptions.  Provided him with prescription for 3 days of Oxy IR which will need to be renewed at SNF by MD there. ? ?Adult failure to  thrive: Reportedly rather acute.  Independent at home until recently when he became wheelchair bound and developed stage I sacral ulcer and missed XRT for a week.  PT evaluated and recommended CIR but CIR does not feel that he is appropriate since does not have adequate support to return home, recommend SNF and patient going to SNF as noted above.   ? ?Body mass index is 21.16 kg/m?. ?  ?Nutritional Status ?Nutrition Problem: Inadequate oral intake ?Etiology: inability to eat ?Signs/Symptoms: NPO status ?Interventions: Tube feeding, MVI, Refer to RD note for recommendations ?  ?Pressure Ulcer: ?    ?Pressure Injury 12/20/21 Coccyx Bilateral;Medial Stage 1 -  Intact skin with non-blanchable redness of a localized area usually over a bony prominence. Reddened non blanchable area to coccyx and bilat buttocks (Active)  ?12/20/21 2330  ?Location: Coccyx  ?Location Orientation: Bilateral;Medial  ?Staging: Stage 1 -  Intact skin with  non-blanchable redness of a localized area usually over a bony prominence.  ?Wound Description (Comments): Reddened non blanchable area to coccyx and bilat buttocks  ?Present on Admission: Yes  ?Dressing Type Foam - Lift dressing to assess site every shift 12/24/21 0818  ?  ?  ?  ?Consultants:   ?Nephrology ?Rehab MD ?  ?Procedures:   ?Foley catheter, discontinued 5/15 ? ? ?Discharge Instructions ? ?Discharge Instructions   ? ? Call MD for:  difficulty breathing, headache or visual disturbances   Complete by: As directed ?  ? Call MD for:  extreme fatigue   Complete by: As directed ?  ? Call MD for:  persistant dizziness or light-headedness   Complete by: As directed ?  ? Call MD for:  persistant nausea and vomiting   Complete by: As directed ?  ? Call MD for:  redness, tenderness, or signs of infection (pain, swelling, redness, odor or green/yellow discharge around incision site)   Complete by: As directed ?  ? Call MD for:  severe uncontrolled pain   Complete by: As directed ?  ? Call MD  for:  temperature >100.4   Complete by: As directed ?  ? Diet full liquid   Complete by: As directed ?  ? Diet full liquid; Fluid consistency: Thin  ?Comments: NO solids (no applesauce/pudding/grits/oa

## 2021-12-25 NOTE — TOC Progression Note (Signed)
Transition of Care (TOC) - Progression Note  ? ? ?Patient Details  ?Name: Arthur Burke ?MRN: [id number] ?Date of Birth: September 26, 1965 ? ?Transition of Care (TOC) CM/SW Contact  ?Purcell Mouton, RN ?Phone Number: ?12/25/2021, 1:56 PM ? ?Clinical Narrative:    ?Pt's brother will transport him to [facility] after Radiation Treatment today.  ? ? ?Expected Discharge Plan: [place] ?Barriers to Discharge: No Barriers Identified ? ?Expected Discharge Plan and Services ?Expected Discharge Plan: [place] ?  ?  ?Post Acute Care Choice: Home Health ?Living arrangements for the past 2 months: Apartment ?                ?  ?  ?  ?  ?  ?  ?  ?  ?  ?  ? ? ?Social Determinants of Health (SDOH) Interventions ?  ? ?Readmission Risk Interventions ?   ? View : No data to display.  ?  ?  ?  ? ? ?

## 2021-12-25 NOTE — Progress Notes (Signed)
Speech Language Pathology ?Patient Details ?Name: Arthur Burke ?MRN: [id number] ?DOB: February 22, 1966 ? ? ?As patient will not qualify for inpatient rehab, SLP is recommending patient return to OP SLP for dysphagia treatment. ? ?Sonia Baller, MA, CCC-SLP ?Speech Therapy ? ?

## 2021-12-26 ENCOUNTER — Ambulatory Visit
Admission: RE | Admit: 2021-12-26 | Discharge: 2021-12-26 | Disposition: A | Discharge status: Home / Self Care | Payer: Medicare Other | Source: Ambulatory Visit | Attending: Radiation Oncology | Admitting: Radiation Oncology

## 2021-12-26 ENCOUNTER — Other Ambulatory Visit: Payer: Self-pay

## 2021-12-26 DIAGNOSIS — Z79621 Long term (current) use of calcineurin inhibitor: Secondary | ICD-10-CM | POA: Diagnosis present

## 2021-12-26 DIAGNOSIS — C090 Malignant neoplasm of tonsillar fossa: Secondary | ICD-10-CM | POA: Diagnosis present

## 2021-12-26 DIAGNOSIS — C109 Malignant neoplasm of oropharynx, unspecified: Secondary | ICD-10-CM | POA: Diagnosis present

## 2021-12-26 DIAGNOSIS — E871 Hypo-osmolality and hyponatremia: Secondary | ICD-10-CM | POA: Diagnosis present

## 2021-12-26 DIAGNOSIS — Z5181 Encounter for therapeutic drug level monitoring: Secondary | ICD-10-CM | POA: Diagnosis present

## 2021-12-26 LAB — RAD ONC ARIA SESSION SUMMARY
Course Elapsed Days: 37
Plan Fractions Treated to Date: 9
Plan Prescribed Dose Per Fraction: 2 Gy
Plan Total Fractions Prescribed: 20
Plan Total Prescribed Dose: 40 Gy
Reference Point Dosage Given to Date: 48 Gy
Reference Point Session Dosage Given: 2 Gy
Session Number: 24

## 2021-12-26 LAB — TACROLIMUS LEVEL: Tacrolimus (FK506) - LabCorp: 3.5 ng/mL (ref 2.0–20.0)

## 2021-12-27 ENCOUNTER — Ambulatory Visit: Payer: Medicare Other | Admitting: Rehabilitation

## 2021-12-27 ENCOUNTER — Other Ambulatory Visit: Payer: Self-pay

## 2021-12-27 ENCOUNTER — Ambulatory Visit
Admission: RE | Admit: 2021-12-27 | Discharge: 2021-12-27 | Disposition: A | Discharge status: Home / Self Care | Payer: Medicare Other | Source: Ambulatory Visit | Attending: Radiation Oncology | Admitting: Radiation Oncology

## 2021-12-27 ENCOUNTER — Ambulatory Visit: Payer: Medicare Other

## 2021-12-27 DIAGNOSIS — Z5181 Encounter for therapeutic drug level monitoring: Secondary | ICD-10-CM | POA: Diagnosis not present

## 2021-12-27 DIAGNOSIS — R293 Abnormal posture: Secondary | ICD-10-CM | POA: Diagnosis present

## 2021-12-27 DIAGNOSIS — R1310 Dysphagia, unspecified: Secondary | ICD-10-CM | POA: Diagnosis present

## 2021-12-27 DIAGNOSIS — I890 Lymphedema, not elsewhere classified: Secondary | ICD-10-CM | POA: Diagnosis present

## 2021-12-27 DIAGNOSIS — C109 Malignant neoplasm of oropharynx, unspecified: Secondary | ICD-10-CM | POA: Diagnosis present

## 2021-12-27 LAB — RAD ONC ARIA SESSION SUMMARY
Course Elapsed Days: 38
Plan Fractions Treated to Date: 10
Plan Prescribed Dose Per Fraction: 2 Gy
Plan Total Fractions Prescribed: 20
Plan Total Prescribed Dose: 40 Gy
Reference Point Dosage Given to Date: 50 Gy
Reference Point Session Dosage Given: 2 Gy
Session Number: 25

## 2021-12-27 NOTE — Therapy (Signed)
OUTPATIENT SPEECH LANGUAGE PATHOLOGY ONCOLOGY EVALUATION   Patient Name: Arthur Burke MRN: [id number] DOB:[birth year], 56 y.o., male Today's Date: 12/27/2021  PCP: Merryl Hacker, No REFERRING PROVIDER: Eppie Gibson, MD   End of Session - 12/27/21 1508     Visit Number 2    Number of Visits 7    Date for SLP Re-Evaluation 02/27/22    SLP Start Time 27    SLP Stop Time  1425    SLP Time Calculation (min) 40 min    Activity Tolerance Patient tolerated treatment well              Past Medical History:  Diagnosis Date   Acute osteomyelitis of left calcaneus ([place])    Cancer ([place])    Diabetes mellitus without complication ([place])    Diabetic foot infection ([place])    Hypertension    Infected ulcer of skin, with fat layer exposed ([place])    Lower limb ulcer, ankle, left, with necrosis of muscle ([place])    MRSA (methicillin resistant staph aureus) culture positive    Renal disorder    Vision impairment    Past Surgical History:  Procedure Laterality Date   AMPUTATION Left 08/23/2017   Procedure: AMPUTATION BELOW KNEE;  Surgeon: Newt Minion, MD;  Location: [place];  Service: Orthopedics;  Laterality: Left;   APPLICATION OF WOUND VAC Left 08/23/2017   Procedure: APPLICATION OF WOUND VAC;  Surgeon: Newt Minion, MD;  Location: [place];  Service: Orthopedics;  Laterality: Left;   IR GASTROSTOMY TUBE MOD SED  10/22/2021   IR IMAGING GUIDED PORT INSERTION  10/22/2021   KIDNEY TRANSPLANT  2006   [facility]   SKIN GRAFT     L diabetic foot wound with debriedment and skin grafting   TOE AMPUTATION     R great and 2nd toe   Patient Active Problem List   Diagnosis Date Noted   Pressure injury of skin 12/22/2021   Hypoglycemia 12/20/2021   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   Nausea 11/07/2021   Malnutrition of moderate degree 10/26/2021   Type 1 diabetes mellitus with hyperglycemia ([place])    Dysphagia    Dehydration 10/21/2021   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   Fall    Essential  hypertension    Unilateral complete BKA, left, sequela (HCC)    Leukocytosis    AKI (acute kidney injury) ([place])    Diabetic peripheral neuropathy associated with type 2 diabetes mellitus ([place])    Amputation of left lower extremity below knee ([place]) 08/26/2017   Anemia in chronic kidney disease    Unilateral complete BKA, left, subsequent encounter (HCC)    Post-operative pain    Stage 3 chronic kidney disease (HCC)    Hyponatremia    Benign essential HTN    Acute blood loss anemia    S/P BKA (below knee amputation) unilateral, left ([place]) 08/23/2017   Streptococcal bacteremia 08/23/2017   Normocytic anemia    History of kidney transplant    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 He had modified (MBSS) Monday.  Pt accompanied by: brother  PERTINENT HISTORY: He presented to the ED on 10/21/21 with a chronic enlarging neck  mass and swallowing difficulties. 10/21/21 CT neck a bulky mass at the left glossotonsillar sulcus measuring 3.2 cm, cervical adenopathy, and multiple and bulky bilateral nodal metastases. Possible extracapsular tumor extension was also noted. 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 11/09/21 PET revealed no evidence of disease beyond the head and neck region.  It confirms the bilateral adenopathy in his neck as well as the primary extending from the tonsillar fossa through the left base of tongue. Consult with Dr. Chryl Heck and Dr. Isidore Moos on 11/09/21. He will not receive chemotherapy due to comorbidities. CT simulation completed 11/09/21 as well. Will receive 35 fractions of  radiation to his Oropharynx and bilateral neck.  He started rad tx on 11/19/21 and will complete on 01/04/22. PAC & PEG 10/22/21 as an inpatient. History of Type 1 diabetes. S/P Left BKA. He usually uses a prothesis, but it works better without it while receiving radiation. Hx Kidney transplant at  Hospital   PAIN:  Are you having pain? No  LIVING ENVIRONMENT: Lives with: lives alone Lives in: House/apartment  PLOF:  Level of assistance: Independent with ADLs Employment: On disability   PATIENT GOALS Maintain WNL swallow function.  OBJECTIVE:   DIAGNOSTIC FINDINGS: Pt seen for on 10-26-21 MBS "for instrumental assessment of baseline swallow function in preparation of radiation to treat oropharyngeal cancer. Pt demonstrates visible tissue abnormality around the valleculae and epiglottis. There is trace residue with liquids and solids in the vallecular space after the swallow. Otherwise the hyolaryngeal mechanism is strong and timely. Pt clears any solids well with a liquid wash. Pill transits with thin liquids without difficulty. Esophageal sweep appears WNL. Pt currently able to consume regular solids and thin liquids. SLP repeated some expectations about swallowing during radiation treatment, emphasizing the potential for increased dysphagia and the importance of following up with SLP for HEP and to address challenges with swallowing and the continued use of the swallowing mechanism during radiation to prevent fibrosis."  12-27-21 - results from this week's (12/24/21) MBSS include: "Moderately impaired pharyngeal phase dysphagia but with oral phase appearing WFL. As compared to [facility] on 10/26/21 which was completed prior to chemoradiation treatment, patient has had a significant decline in swallow function. ..Marland KitchenSLP is recommending patient consume liquids only to decrease aspiration risk and allow for him to continue using his swallowing musculature. Medications should be taken via PEG."   TODAY'S  TREATMENT:  12/27/21 SLP reviewed pt's results from his MBSS this week. Pt is drinking liquids (tea or water) at this time, a couple times a day. Today pt was observed with liquids, with consistent throat clear and reswallow (I.e., recommended compensations) after each sip. SLP educated pt/brother about  overt s/sx of aspiration PNA and presented these to pt in handout form. Pt told SLP these back with modified independence. He told SLP rationale for HEP with min A. He req'd rare min A for completion of HEP. SLP reiterated to pt to complete HEP as directed, as pt has not done so with his hospitalization and temporary move to SNF.   11/28/21 (eval) Research states the risk for dysphagia increases due to radiation and/or chemotherapy treatment due to a variety of factors, so SLP educated the pt about the possibility of reduced/limited ability for PO intake during rad tx. SLP also educated pt regarding possible changes to swallowing musculature after rad tx, and why adherence to dysphagia HEP provided today and PO consumption was necessary to inhibit muscle  fibrosis following rad tx and to mitigate muscle disuse atrophy. SLP informed pt why this would be detrimental to their swallowing status and to their pulmonary health. Pt demonstrated understanding of these things to SLP. SLP encouraged pt to safely eat and drink as deep into their radiation/chemotherapy as possible to provide the best possible long-term swallowing outcome for pt.    SLP then developed an individualized HEP for pt involving oral and pharyngeal strengthening and ROM and pt was instructed how to perform these exercises, including SLP demonstration. After SLP demonstration, pt return demonstrated each exercise. SLP ensured pt performance was correct prior to educating pt on next exercise. Pt required rare min cues faded to modified independent to perform HEP. Pt was instructed to complete this program 6-7 days/week, at least 2 times a day until  6 months after his or her last day of rad tx, and then x2 a week after that, indefinitely. Among other modifications for days when pt cannot functionally swallow, SLP also suggested pt to cycle through the swallowing portion so the full program of exercises can be completed instead of fatiguing on one of the swallowing exercises and being unable to perform the other swallowing exercises. SLP instructed that swallowing exercises should then be added back into the regimen as pt is able to do so. Secondly, pt was told that former patients have told SLP that during their course of radiation therapy, taking prescribed pain medication just prior to performing HEP (and eating/drinking) has proven helpful in completing HEP (and eating and drinking) more regularly when going through their course of radiation treatment.    PATIENT EDUCATION: Education details: late effects head/neck radiation on swallow function, HEP procedure, and modification to HEP when difficulty experienced with swallowing during and after radiation course Person educated: Patient and Caregiver Education method: Explanation, Demonstration, Verbal cues, and Handouts Education comprehension: verbalized understanding and returned demonstration   ASSESSMENT:  CLINICAL IMPRESSION: Patient is a 56 y.o. male who was seen today for assessment of swallowing as they undergo radiation/chemoradiation therapy. Pt with MBSS 12-24-21 with rec of liquids only with throat clear and reswallow after each sip. (See "imaging" for more details) Today pt drank  water  sips x10 with throat clear and reswallow after each sip. At this time pt swallowing is deemed [facility] with thin liquids, for inihibition of muscle disuse atrophy. There are no overt s/s aspiration PNA observed by SLP nor any reported by pt at this time. Data indicate that pt's swallow ability will likely decrease over the course of radiation/chemoradiation therapy and could very well decline over time  following the conclusion of that therapy due to muscle disuse atrophy and/or muscle fibrosis. Pt will cont to need to be seen by SLP in order to assess safety of PO intake, assess the need for recommending any objective swallow assessment, and ensuring pt is correctly completing the individualized HEP.  OBJECTIVE IMPAIRMENTS include dysphagia. These impairments may limit patient from safety when swallowing. Factors affecting potential to achieve goals and functional outcome are  nothing . Patient will benefit from skilled SLP services to address above impairments and improve overall function.  REHAB POTENTIAL: Good   GOALS: Goals reviewed with patient? No  SHORT TERM GOALS: Target date: 01/24/2022  pt will complete HEP with rare min A in 2 sessions  Baseline:      12-27-21 Goal status: INITIAL  2.  pt will tell SLP why pt is completing HEP with modified independence  Baseline:  Goal status: INITIAL  3.  pt will describe 3 overt s/s aspiration PNA with modified independence  Baseline:  Goal status: MET  4.  Pt will tell how a food journal can assist return to more WNL diet Baseline:  Goal status: INITIAL  LONG TERM GOALS: Target date: 02/27/2022  pt will complete HEP with modified independence over two visits Baseline:  Goal status: INITIAL  2.  pt will describe how to modify HEP over time, and the timeline associated with reduction in HEP frequency with modified independence over two sessions Baseline:  Goal status: INITIAL   PLAN: SLP FREQUENCY:  once every approx 4 weeks  SLP DURATION:  7 sessions (4 total in this reporting period)  PLANNED INTERVENTIONS: Aspiration precaution training, Pharyngeal strengthening exercises, Diet toleration management , Trials of upgraded texture/liquids, Oral motor exercises, SLP instruction and feedback, Compensatory strategies, and Patient/family education    [facility], [place] 12/27/2021, 3:08 PM

## 2021-12-28 ENCOUNTER — Ambulatory Visit
Admission: RE | Admit: 2021-12-28 | Discharge: 2021-12-28 | Disposition: A | Discharge status: Home / Self Care | Payer: Medicare Other | Source: Ambulatory Visit | Attending: Radiation Oncology | Admitting: Radiation Oncology

## 2021-12-28 ENCOUNTER — Other Ambulatory Visit: Payer: Self-pay

## 2021-12-28 DIAGNOSIS — Z5181 Encounter for therapeutic drug level monitoring: Secondary | ICD-10-CM | POA: Diagnosis not present

## 2021-12-28 LAB — RAD ONC ARIA SESSION SUMMARY
Course Elapsed Days: 39
Plan Fractions Treated to Date: 11
Plan Prescribed Dose Per Fraction: 2 Gy
Plan Total Fractions Prescribed: 20
Plan Total Prescribed Dose: 40 Gy
Reference Point Dosage Given to Date: 52 Gy
Reference Point Session Dosage Given: 2 Gy
Session Number: 26

## 2021-12-31 ENCOUNTER — Ambulatory Visit
Admission: RE | Admit: 2021-12-31 | Discharge: 2021-12-31 | Disposition: A | Discharge status: Home / Self Care | Payer: Medicare Other | Source: Ambulatory Visit | Attending: Radiation Oncology | Admitting: Radiation Oncology

## 2021-12-31 ENCOUNTER — Other Ambulatory Visit: Payer: Self-pay | Admitting: Internal Medicine

## 2021-12-31 ENCOUNTER — Other Ambulatory Visit: Payer: Self-pay

## 2021-12-31 ENCOUNTER — Other Ambulatory Visit (HOSPITAL_COMMUNITY): Payer: Self-pay

## 2021-12-31 DIAGNOSIS — Z5181 Encounter for therapeutic drug level monitoring: Secondary | ICD-10-CM | POA: Diagnosis not present

## 2021-12-31 DIAGNOSIS — Z940 Kidney transplant status: Secondary | ICD-10-CM

## 2021-12-31 LAB — RAD ONC ARIA SESSION SUMMARY
Course Elapsed Days: 42
Plan Fractions Treated to Date: 12
Plan Prescribed Dose Per Fraction: 2 Gy
Plan Total Fractions Prescribed: 20
Plan Total Prescribed Dose: 40 Gy
Reference Point Dosage Given to Date: 54 Gy
Reference Point Session Dosage Given: 2 Gy
Session Number: 27

## 2021-12-31 NOTE — Progress Notes (Signed)
Oncology Nurse Navigator Documentation   At Dr. Pearlie Oyster request I have called [facility] to discuss Arthur Burke's pain management needs as he receives radiation for his Tonsil Cancer. I left a message for Dr. Angie Fava indicating that Arthur Burke would have increased pain medication needs as he continues with radiation treatment for his cancer. He will continue with increased pain needs for the next 2 months which include the completion of his radiation treatment and recovery period. I provided them with my direct contact information and Dr. Pearlie Oyster cell phone number should Dr. Angie Fava want to contact her.   [name] RN, BSN, OCN Head & Neck Oncology Nurse [place] at [facility] Phone # [phone]  Fax # [phone]

## 2021-12-31 NOTE — Telephone Encounter (Signed)
This needs to be sent to his nephrologist office at [facility].

## 2022-01-01 ENCOUNTER — Encounter: Payer: Medicare Other | Admitting: Nutrition

## 2022-01-01 ENCOUNTER — Ambulatory Visit
Admission: RE | Admit: 2022-01-01 | Discharge: 2022-01-01 | Disposition: A | Discharge status: Home / Self Care | Payer: Medicare Other | Source: Ambulatory Visit | Attending: Radiation Oncology | Admitting: Radiation Oncology

## 2022-01-01 ENCOUNTER — Inpatient Hospital Stay: Payer: Medicare Other | Admitting: Nutrition

## 2022-01-01 ENCOUNTER — Other Ambulatory Visit: Payer: Self-pay

## 2022-01-01 ENCOUNTER — Encounter: Payer: Self-pay | Admitting: Nutrition

## 2022-01-01 DIAGNOSIS — Z5181 Encounter for therapeutic drug level monitoring: Secondary | ICD-10-CM | POA: Diagnosis not present

## 2022-01-01 LAB — RAD ONC ARIA SESSION SUMMARY
Course Elapsed Days: 43
Plan Fractions Treated to Date: 13
Plan Prescribed Dose Per Fraction: 2 Gy
Plan Total Fractions Prescribed: 20
Plan Total Prescribed Dose: 40 Gy
Reference Point Dosage Given to Date: 56 Gy
Reference Point Session Dosage Given: 2 Gy
Session Number: 28

## 2022-01-01 NOTE — Progress Notes (Signed)
Patient did not show up for nutrition appointment. 

## 2022-01-02 ENCOUNTER — Ambulatory Visit
Admission: RE | Admit: 2022-01-02 | Discharge: 2022-01-02 | Disposition: A | Discharge status: Home / Self Care | Payer: Medicare Other | Source: Ambulatory Visit | Attending: Radiation Oncology | Admitting: Radiation Oncology

## 2022-01-02 ENCOUNTER — Other Ambulatory Visit (HOSPITAL_COMMUNITY): Payer: Self-pay

## 2022-01-02 ENCOUNTER — Other Ambulatory Visit: Payer: Self-pay

## 2022-01-02 DIAGNOSIS — Z5181 Encounter for therapeutic drug level monitoring: Secondary | ICD-10-CM | POA: Diagnosis not present

## 2022-01-02 LAB — RAD ONC ARIA SESSION SUMMARY
Course Elapsed Days: 44
Plan Fractions Treated to Date: 14
Plan Prescribed Dose Per Fraction: 2 Gy
Plan Total Fractions Prescribed: 20
Plan Total Prescribed Dose: 40 Gy
Reference Point Dosage Given to Date: 58 Gy
Reference Point Session Dosage Given: 2 Gy
Session Number: 29

## 2022-01-03 ENCOUNTER — Ambulatory Visit
Admission: RE | Admit: 2022-01-03 | Discharge: 2022-01-03 | Disposition: A | Discharge status: Home / Self Care | Payer: Medicare Other | Source: Ambulatory Visit | Attending: Radiation Oncology | Admitting: Radiation Oncology

## 2022-01-03 ENCOUNTER — Other Ambulatory Visit: Payer: Self-pay

## 2022-01-03 DIAGNOSIS — Z5181 Encounter for therapeutic drug level monitoring: Secondary | ICD-10-CM | POA: Diagnosis not present

## 2022-01-03 LAB — RAD ONC ARIA SESSION SUMMARY
Course Elapsed Days: 45
Plan Fractions Treated to Date: 15
Plan Prescribed Dose Per Fraction: 2 Gy
Plan Total Fractions Prescribed: 20
Plan Total Prescribed Dose: 40 Gy
Reference Point Dosage Given to Date: 60 Gy
Reference Point Session Dosage Given: 2 Gy
Session Number: 30

## 2022-01-04 ENCOUNTER — Other Ambulatory Visit: Payer: Self-pay

## 2022-01-04 ENCOUNTER — Ambulatory Visit
Admission: RE | Admit: 2022-01-04 | Discharge: 2022-01-04 | Disposition: A | Discharge status: Home / Self Care | Payer: Medicare Other | Source: Ambulatory Visit | Attending: Radiation Oncology | Admitting: Radiation Oncology

## 2022-01-04 ENCOUNTER — Ambulatory Visit: Payer: Medicare Other

## 2022-01-04 ENCOUNTER — Other Ambulatory Visit (HOSPITAL_COMMUNITY): Payer: Self-pay

## 2022-01-04 DIAGNOSIS — Z5181 Encounter for therapeutic drug level monitoring: Secondary | ICD-10-CM | POA: Diagnosis not present

## 2022-01-04 LAB — RAD ONC ARIA SESSION SUMMARY
Course Elapsed Days: 46
Plan Fractions Treated to Date: 16
Plan Prescribed Dose Per Fraction: 2 Gy
Plan Total Fractions Prescribed: 20
Plan Total Prescribed Dose: 40 Gy
Reference Point Dosage Given to Date: 62 Gy
Reference Point Session Dosage Given: 2 Gy
Session Number: 31

## 2022-01-07 ENCOUNTER — Ambulatory Visit: Payer: Medicare Other

## 2022-01-08 ENCOUNTER — Ambulatory Visit: Payer: Medicare Other

## 2022-01-08 ENCOUNTER — Other Ambulatory Visit: Payer: Self-pay

## 2022-01-08 ENCOUNTER — Other Ambulatory Visit (HOSPITAL_COMMUNITY): Payer: Self-pay

## 2022-01-08 ENCOUNTER — Ambulatory Visit: Payer: Medicare Other | Attending: Radiation Oncology

## 2022-01-08 ENCOUNTER — Ambulatory Visit
Admission: RE | Admit: 2022-01-08 | Discharge: 2022-01-08 | Disposition: A | Discharge status: Home / Self Care | Payer: Medicare Other | Source: Ambulatory Visit | Attending: Radiation Oncology | Admitting: Radiation Oncology

## 2022-01-08 ENCOUNTER — Other Ambulatory Visit: Payer: Self-pay | Admitting: Radiation Oncology

## 2022-01-08 DIAGNOSIS — C090 Malignant neoplasm of tonsillar fossa: Secondary | ICD-10-CM | POA: Insufficient documentation

## 2022-01-08 DIAGNOSIS — Z5181 Encounter for therapeutic drug level monitoring: Secondary | ICD-10-CM | POA: Diagnosis not present

## 2022-01-08 DIAGNOSIS — Z79621 Long term (current) use of calcineurin inhibitor: Secondary | ICD-10-CM | POA: Insufficient documentation

## 2022-01-08 DIAGNOSIS — Z940 Kidney transplant status: Secondary | ICD-10-CM

## 2022-01-08 DIAGNOSIS — C109 Malignant neoplasm of oropharynx, unspecified: Secondary | ICD-10-CM | POA: Insufficient documentation

## 2022-01-08 DIAGNOSIS — E871 Hypo-osmolality and hyponatremia: Secondary | ICD-10-CM | POA: Insufficient documentation

## 2022-01-08 LAB — RAD ONC ARIA SESSION SUMMARY
Course Elapsed Days: 50
Plan Fractions Treated to Date: 17
Plan Prescribed Dose Per Fraction: 2 Gy
Plan Total Fractions Prescribed: 20
Plan Total Prescribed Dose: 40 Gy
Reference Point Dosage Given to Date: 64 Gy
Reference Point Session Dosage Given: 2 Gy
Session Number: 32

## 2022-01-09 ENCOUNTER — Other Ambulatory Visit: Payer: Self-pay

## 2022-01-09 ENCOUNTER — Ambulatory Visit: Payer: Medicare Other

## 2022-01-09 ENCOUNTER — Telehealth: Payer: Self-pay

## 2022-01-09 ENCOUNTER — Ambulatory Visit
Admission: RE | Admit: 2022-01-09 | Discharge: 2022-01-09 | Disposition: A | Discharge status: Home / Self Care | Payer: Medicare Other | Source: Ambulatory Visit | Attending: Radiation Oncology | Admitting: Radiation Oncology

## 2022-01-09 DIAGNOSIS — Z5181 Encounter for therapeutic drug level monitoring: Secondary | ICD-10-CM | POA: Diagnosis not present

## 2022-01-09 LAB — RAD ONC ARIA SESSION SUMMARY
Course Elapsed Days: 51
Plan Fractions Treated to Date: 18
Plan Prescribed Dose Per Fraction: 2 Gy
Plan Total Fractions Prescribed: 20
Plan Total Prescribed Dose: 40 Gy
Reference Point Dosage Given to Date: 66 Gy
Reference Point Session Dosage Given: 2 Gy
Session Number: 33

## 2022-01-09 NOTE — Telephone Encounter (Signed)
Call placed to Dr. Tally Due office to request follow up appointment for patient, also requested that Dr. Tally Due refill Cellcept '200mg'$ /ml. Patient requested medication be sent to [facility] long out patient pharmacy. Message left. Awaiting call back.  Called placed to [facility] internal medicine to request new PCP due to Dr. Court Joy leaving the practice. Spoke with scheduler whom stated she would reach out to patient and provide new PCP information.

## 2022-01-09 NOTE — Telephone Encounter (Signed)
Received call from Olivia Mackie ( Dr. Irving Shows office) per Olivia Mackie she will reach out to Mr. Formisano  with a new appointment.

## 2022-01-10 ENCOUNTER — Other Ambulatory Visit: Payer: Self-pay

## 2022-01-10 ENCOUNTER — Ambulatory Visit
Admission: RE | Admit: 2022-01-10 | Discharge: 2022-01-10 | Disposition: A | Discharge status: Home / Self Care | Payer: Medicare Other | Source: Ambulatory Visit | Attending: Radiation Oncology | Admitting: Radiation Oncology

## 2022-01-10 ENCOUNTER — Ambulatory Visit: Payer: Medicare Other

## 2022-01-10 ENCOUNTER — Other Ambulatory Visit (HOSPITAL_COMMUNITY): Payer: Self-pay

## 2022-01-10 DIAGNOSIS — C090 Malignant neoplasm of tonsillar fossa: Secondary | ICD-10-CM | POA: Insufficient documentation

## 2022-01-10 LAB — RAD ONC ARIA SESSION SUMMARY
Course Elapsed Days: 52
Plan Fractions Treated to Date: 19
Plan Prescribed Dose Per Fraction: 2 Gy
Plan Total Fractions Prescribed: 20
Plan Total Prescribed Dose: 40 Gy
Reference Point Dosage Given to Date: 68 Gy
Reference Point Session Dosage Given: 2 Gy
Session Number: 34

## 2022-01-11 ENCOUNTER — Other Ambulatory Visit: Payer: Self-pay

## 2022-01-11 ENCOUNTER — Encounter: Payer: Self-pay | Admitting: Radiation Oncology

## 2022-01-11 ENCOUNTER — Ambulatory Visit
Admission: RE | Admit: 2022-01-11 | Discharge: 2022-01-11 | Disposition: A | Discharge status: Home / Self Care | Payer: Medicare Other | Source: Ambulatory Visit | Attending: Radiation Oncology | Admitting: Radiation Oncology

## 2022-01-11 ENCOUNTER — Other Ambulatory Visit (HOSPITAL_COMMUNITY): Payer: Self-pay

## 2022-01-11 DIAGNOSIS — E871 Hypo-osmolality and hyponatremia: Secondary | ICD-10-CM | POA: Diagnosis not present

## 2022-01-11 DIAGNOSIS — M7989 Other specified soft tissue disorders: Secondary | ICD-10-CM | POA: Diagnosis not present

## 2022-01-11 LAB — RAD ONC ARIA SESSION SUMMARY
Course Elapsed Days: 53
Plan Fractions Treated to Date: 20
Plan Prescribed Dose Per Fraction: 2 Gy
Plan Total Fractions Prescribed: 20
Plan Total Prescribed Dose: 40 Gy
Reference Point Dosage Given to Date: 70 Gy
Reference Point Session Dosage Given: 2 Gy
Session Number: 35

## 2022-01-11 NOTE — Progress Notes (Signed)
Oncology Nurse Navigator Documentation   Met with Arthur Burke and his brother after final RT to offer support and to celebrate end of radiation treatment.   Provided verbal post-RT guidance: Importance of keeping all follow-up appts, especially those with Nutrition, SLP and PT. Importance of protecting treatment area from sun. Continuation of Sonafine application 2-3 times daily, application of antibiotic ointment to areas of raw skin; when supply of Sonafine exhausted transition to OTC lotion with vitamin E. Explained my role as navigator will continue for several more months, encouraged him to call me with needs/concerns.    [name] RN, BSN, OCN Head & Neck Oncology Nurse [place] at [facility] Phone # [phone]  Fax # [phone]

## 2022-01-13 ENCOUNTER — Other Ambulatory Visit: Payer: Self-pay

## 2022-01-13 ENCOUNTER — Emergency Department (HOSPITAL_COMMUNITY): Payer: Medicare Other

## 2022-01-13 ENCOUNTER — Encounter (HOSPITAL_COMMUNITY): Payer: Self-pay

## 2022-01-13 ENCOUNTER — Inpatient Hospital Stay (HOSPITAL_COMMUNITY)
Admission: EM | Admit: 2022-01-13 | Discharge: 2022-01-21 | DRG: 640 | Disposition: A | Discharge status: Skilled Nursing Facility | Payer: Medicare Other | Attending: Internal Medicine | Admitting: Internal Medicine

## 2022-01-13 DIAGNOSIS — Z8614 Personal history of Methicillin resistant Staphylococcus aureus infection: Secondary | ICD-10-CM

## 2022-01-13 DIAGNOSIS — N1831 Chronic kidney disease, stage 3a: Secondary | ICD-10-CM | POA: Diagnosis present

## 2022-01-13 DIAGNOSIS — C109 Malignant neoplasm of oropharynx, unspecified: Secondary | ICD-10-CM | POA: Diagnosis present

## 2022-01-13 DIAGNOSIS — J690 Pneumonitis due to inhalation of food and vomit: Secondary | ICD-10-CM | POA: Diagnosis not present

## 2022-01-13 DIAGNOSIS — I120 Hypertensive chronic kidney disease with stage 5 chronic kidney disease or end stage renal disease: Secondary | ICD-10-CM | POA: Diagnosis present

## 2022-01-13 DIAGNOSIS — E8770 Fluid overload, unspecified: Secondary | ICD-10-CM | POA: Diagnosis present

## 2022-01-13 DIAGNOSIS — D631 Anemia in chronic kidney disease: Secondary | ICD-10-CM | POA: Diagnosis present

## 2022-01-13 DIAGNOSIS — Z940 Kidney transplant status: Secondary | ICD-10-CM

## 2022-01-13 DIAGNOSIS — Z888 Allergy status to other drugs, medicaments and biological substances status: Secondary | ICD-10-CM

## 2022-01-13 DIAGNOSIS — E1040 Type 1 diabetes mellitus with diabetic neuropathy, unspecified: Secondary | ICD-10-CM | POA: Diagnosis present

## 2022-01-13 DIAGNOSIS — G8929 Other chronic pain: Secondary | ICD-10-CM | POA: Diagnosis present

## 2022-01-13 DIAGNOSIS — D509 Iron deficiency anemia, unspecified: Secondary | ICD-10-CM | POA: Diagnosis present

## 2022-01-13 DIAGNOSIS — I10 Essential (primary) hypertension: Secondary | ICD-10-CM | POA: Diagnosis present

## 2022-01-13 DIAGNOSIS — Z89512 Acquired absence of left leg below knee: Secondary | ICD-10-CM

## 2022-01-13 DIAGNOSIS — Z79899 Other long term (current) drug therapy: Secondary | ICD-10-CM

## 2022-01-13 DIAGNOSIS — R600 Localized edema: Secondary | ICD-10-CM | POA: Diagnosis present

## 2022-01-13 DIAGNOSIS — R1310 Dysphagia, unspecified: Secondary | ICD-10-CM | POA: Diagnosis present

## 2022-01-13 DIAGNOSIS — Z931 Gastrostomy status: Secondary | ICD-10-CM

## 2022-01-13 DIAGNOSIS — E440 Moderate protein-calorie malnutrition: Secondary | ICD-10-CM | POA: Diagnosis present

## 2022-01-13 DIAGNOSIS — L89151 Pressure ulcer of sacral region, stage 1: Secondary | ICD-10-CM | POA: Diagnosis present

## 2022-01-13 DIAGNOSIS — Y830 Surgical operation with transplant of whole organ as the cause of abnormal reaction of the patient, or of later complication, without mention of misadventure at the time of the procedure: Secondary | ICD-10-CM | POA: Diagnosis present

## 2022-01-13 DIAGNOSIS — E1065 Type 1 diabetes mellitus with hyperglycemia: Secondary | ICD-10-CM | POA: Diagnosis present

## 2022-01-13 DIAGNOSIS — D84821 Immunodeficiency due to drugs: Secondary | ICD-10-CM | POA: Diagnosis present

## 2022-01-13 DIAGNOSIS — R339 Retention of urine, unspecified: Secondary | ICD-10-CM

## 2022-01-13 DIAGNOSIS — I472 Ventricular tachycardia, unspecified: Secondary | ICD-10-CM | POA: Diagnosis not present

## 2022-01-13 DIAGNOSIS — T8619 Other complication of kidney transplant: Secondary | ICD-10-CM | POA: Diagnosis present

## 2022-01-13 DIAGNOSIS — Z6820 Body mass index (BMI) 20.0-20.9, adult: Secondary | ICD-10-CM

## 2022-01-13 DIAGNOSIS — E8809 Other disorders of plasma-protein metabolism, not elsewhere classified: Secondary | ICD-10-CM | POA: Diagnosis present

## 2022-01-13 DIAGNOSIS — E785 Hyperlipidemia, unspecified: Secondary | ICD-10-CM | POA: Diagnosis present

## 2022-01-13 DIAGNOSIS — Z87891 Personal history of nicotine dependence: Secondary | ICD-10-CM

## 2022-01-13 DIAGNOSIS — E10649 Type 1 diabetes mellitus with hypoglycemia without coma: Secondary | ICD-10-CM | POA: Diagnosis not present

## 2022-01-13 DIAGNOSIS — Z923 Personal history of irradiation: Secondary | ICD-10-CM

## 2022-01-13 DIAGNOSIS — Z7952 Long term (current) use of systemic steroids: Secondary | ICD-10-CM

## 2022-01-13 DIAGNOSIS — N179 Acute kidney failure, unspecified: Secondary | ICD-10-CM | POA: Diagnosis present

## 2022-01-13 DIAGNOSIS — R609 Edema, unspecified: Principal | ICD-10-CM

## 2022-01-13 DIAGNOSIS — H547 Unspecified visual loss: Secondary | ICD-10-CM | POA: Diagnosis present

## 2022-01-13 DIAGNOSIS — K219 Gastro-esophageal reflux disease without esophagitis: Secondary | ICD-10-CM | POA: Diagnosis present

## 2022-01-13 DIAGNOSIS — E1022 Type 1 diabetes mellitus with diabetic chronic kidney disease: Secondary | ICD-10-CM | POA: Diagnosis present

## 2022-01-13 DIAGNOSIS — Z993 Dependence on wheelchair: Secondary | ICD-10-CM

## 2022-01-13 DIAGNOSIS — Z794 Long term (current) use of insulin: Secondary | ICD-10-CM

## 2022-01-13 DIAGNOSIS — N189 Chronic kidney disease, unspecified: Secondary | ICD-10-CM | POA: Diagnosis present

## 2022-01-13 DIAGNOSIS — E871 Hypo-osmolality and hyponatremia: Principal | ICD-10-CM | POA: Diagnosis present

## 2022-01-13 DIAGNOSIS — I872 Venous insufficiency (chronic) (peripheral): Secondary | ICD-10-CM | POA: Diagnosis present

## 2022-01-13 DIAGNOSIS — Z7969 Long term (current) use of other immunomodulators and immunosuppressants: Secondary | ICD-10-CM

## 2022-01-13 DIAGNOSIS — N1330 Unspecified hydronephrosis: Secondary | ICD-10-CM | POA: Diagnosis present

## 2022-01-13 DIAGNOSIS — Z79621 Long term (current) use of calcineurin inhibitor: Secondary | ICD-10-CM

## 2022-01-13 LAB — CBC
HCT: 24.7 % — ABNORMAL LOW (ref 39.0–52.0)
Hemoglobin: 8.3 g/dL — ABNORMAL LOW (ref 13.0–17.0)
MCH: 29.4 pg (ref 26.0–34.0)
MCHC: 33.6 g/dL (ref 30.0–36.0)
MCV: 87.6 fL (ref 80.0–100.0)
Platelets: 370 10*3/uL (ref 150–400)
RBC: 2.82 MIL/uL — ABNORMAL LOW (ref 4.22–5.81)
RDW: 16.4 % — ABNORMAL HIGH (ref 11.5–15.5)
WBC: 9.2 10*3/uL (ref 4.0–10.5)
nRBC: 0 % (ref 0.0–0.2)

## 2022-01-13 LAB — HEPATIC FUNCTION PANEL
ALT: 11 U/L (ref 0–44)
AST: 16 U/L (ref 15–41)
Albumin: 2.4 g/dL — ABNORMAL LOW (ref 3.5–5.0)
Alkaline Phosphatase: 125 U/L (ref 38–126)
Bilirubin, Direct: 0.2 mg/dL (ref 0.0–0.2)
Indirect Bilirubin: 0.5 mg/dL (ref 0.3–0.9)
Total Bilirubin: 0.7 mg/dL (ref 0.3–1.2)
Total Protein: 6.3 g/dL — ABNORMAL LOW (ref 6.5–8.1)

## 2022-01-13 LAB — BASIC METABOLIC PANEL
Anion gap: 10 (ref 5–15)
BUN: 72 mg/dL — ABNORMAL HIGH (ref 6–20)
CO2: 29 mmol/L (ref 22–32)
Calcium: 8.8 mg/dL — ABNORMAL LOW (ref 8.9–10.3)
Chloride: 87 mmol/L — ABNORMAL LOW (ref 98–111)
Creatinine, Ser: 1.94 mg/dL — ABNORMAL HIGH (ref 0.61–1.24)
GFR, Estimated: 40 mL/min — ABNORMAL LOW (ref >60)
Glucose, Bld: 278 mg/dL — ABNORMAL HIGH (ref 70–99)
Potassium: 4.1 mmol/L (ref 3.5–5.1)
Sodium: 126 mmol/L — ABNORMAL LOW (ref 135–145)

## 2022-01-13 LAB — BRAIN NATRIURETIC PEPTIDE: B Natriuretic Peptide: 663.4 pg/mL — ABNORMAL HIGH (ref 0.0–100.0)

## 2022-01-13 LAB — CBG MONITORING, ED: Glucose-Capillary: 261 mg/dL — ABNORMAL HIGH (ref 70–99)

## 2022-01-13 MED ORDER — PREDNISONE 5 MG PO TABS
5.0000 mg | ORAL_TABLET | Freq: Every day | ORAL | Status: DC
Start: 2022-01-14 — End: 2022-01-21
  Administered 2022-01-14 – 2022-01-21 (×8): 5 mg
  Filled 2022-01-13 (×8): qty 1

## 2022-01-13 MED ORDER — CHLORHEXIDINE GLUCONATE CLOTH 2 % EX PADS
6.0000 | MEDICATED_PAD | Freq: Every day | CUTANEOUS | Status: DC
  Administered 2022-01-15 – 2022-01-21 (×7): 6 via TOPICAL

## 2022-01-13 MED ORDER — TACROLIMUS 5 MG PO CAPS
1.0000 mg | ORAL_CAPSULE | Freq: Two times a day (BID) | ORAL | Status: DC
Start: 2022-01-14 — End: 2022-01-21
  Administered 2022-01-14 – 2022-01-21 (×15): 1 mg
  Filled 2022-01-13 (×17): qty 0.2

## 2022-01-13 MED ORDER — OXYCODONE HCL 5 MG PO TABS
5.0000 mg | ORAL_TABLET | ORAL | Status: AC
  Administered 2022-01-13: 5 mg
  Filled 2022-01-13: qty 1

## 2022-01-13 MED ORDER — NEPRO/CARBSTEADY PO LIQD
237.0000 mL | Freq: Every day | ORAL | Status: DC
Start: 2022-01-13 — End: 2022-01-15
  Administered 2022-01-14 – 2022-01-15 (×8): 237 mL
  Filled 2022-01-13 (×4): qty 237

## 2022-01-13 MED ORDER — SODIUM CHLORIDE 0.9% FLUSH
10.0000 mL | INTRAVENOUS | Status: DC | PRN
  Administered 2022-01-18 – 2022-01-19 (×2): 10 mL

## 2022-01-13 MED ORDER — ADULT MULTIVITAMIN LIQUID CH
15.0000 mL | Freq: Every day | ORAL | Status: DC
  Administered 2022-01-14 – 2022-01-21 (×8): 15 mL
  Filled 2022-01-13 (×8): qty 15

## 2022-01-13 MED ORDER — AMLODIPINE BESYLATE 5 MG PO TABS
10.0000 mg | ORAL_TABLET | Freq: Every day | ORAL | Status: DC
Start: 2022-01-13 — End: 2022-01-13

## 2022-01-13 MED ORDER — POLYETHYLENE GLYCOL 3350 17 G PO PACK
17.0000 g | PACK | Freq: Every day | ORAL | Status: DC | PRN
  Administered 2022-01-15 – 2022-01-19 (×2): 17 g
  Filled 2022-01-13: qty 1

## 2022-01-13 MED ORDER — CARVEDILOL 25 MG PO TABS
25.0000 mg | ORAL_TABLET | Freq: Two times a day (BID) | ORAL | Status: DC
Start: 2022-01-13 — End: 2022-01-21
  Administered 2022-01-13 – 2022-01-21 (×16): 25 mg
  Filled 2022-01-13 (×11): qty 1
  Filled 2022-01-13: qty 2
  Filled 2022-01-13 (×2): qty 1
  Filled 2022-01-13: qty 2
  Filled 2022-01-13: qty 1

## 2022-01-13 MED ORDER — AMLODIPINE BESYLATE 10 MG PO TABS
10.0000 mg | ORAL_TABLET | Freq: Every day | ORAL | Status: DC
  Administered 2022-01-14 – 2022-01-21 (×8): 10 mg
  Filled 2022-01-13 (×3): qty 1
  Filled 2022-01-13: qty 2
  Filled 2022-01-13 (×4): qty 1

## 2022-01-13 MED ORDER — INSULIN ASPART 100 UNIT/ML IJ SOLN
0.0000 [IU] | Freq: Three times a day (TID) | INTRAMUSCULAR | Status: DC
  Administered 2022-01-14: 3 [IU] via SUBCUTANEOUS

## 2022-01-13 MED ORDER — CLONIDINE HCL 0.1 MG PO TABS
0.1000 mg | ORAL_TABLET | Freq: Two times a day (BID) | ORAL | Status: DC
Start: 2022-01-13 — End: 2022-01-21
  Administered 2022-01-13 – 2022-01-21 (×16): 0.1 mg
  Filled 2022-01-13 (×16): qty 1

## 2022-01-13 MED ORDER — INSULIN GLARGINE-YFGN 100 UNIT/ML ~~LOC~~ SOLN
20.0000 [IU] | Freq: Every day | SUBCUTANEOUS | Status: DC
  Administered 2022-01-13: 20 [IU] via SUBCUTANEOUS
  Filled 2022-01-13 (×2): qty 0.2

## 2022-01-13 MED ORDER — OXYCODONE HCL 5 MG PO TABS: 20.0000 mg | ORAL_TABLET | ORAL | Status: DC | PRN

## 2022-01-13 MED ORDER — OXYCODONE HCL 5 MG PO TABS
15.0000 mg | ORAL_TABLET | ORAL | Status: DC | PRN
  Administered 2022-01-13: 15 mg
  Filled 2022-01-13: qty 3

## 2022-01-13 MED ORDER — LACTULOSE 10 GM/15ML PO SOLN: 10.0000 g | Freq: Every day | ORAL | Status: DC | PRN

## 2022-01-13 MED ORDER — VITAMIN B-12 1000 MCG PO TABS
500.0000 ug | ORAL_TABLET | Freq: Every day | ORAL | Status: DC
  Administered 2022-01-14 – 2022-01-21 (×8): 500 ug
  Filled 2022-01-13 (×8): qty 1

## 2022-01-13 MED ORDER — LIDOCAINE VISCOUS HCL 2 % MT SOLN: 15.0000 mL | Freq: Four times a day (QID) | OROMUCOSAL | Status: DC | PRN

## 2022-01-13 MED ORDER — ONDANSETRON HCL 4 MG PO TABS: 4.0000 mg | ORAL_TABLET | Freq: Two times a day (BID) | ORAL | Status: DC | PRN

## 2022-01-13 MED ORDER — OXYCODONE HCL 5 MG PO TABS: 5.0000 mg | ORAL_TABLET | ORAL | Status: DC

## 2022-01-13 MED ORDER — ENOXAPARIN SODIUM 30 MG/0.3ML IJ SOSY
30.0000 mg | PREFILLED_SYRINGE | INTRAMUSCULAR | Status: DC
  Administered 2022-01-13 – 2022-01-19 (×7): 30 mg via SUBCUTANEOUS
  Filled 2022-01-13 (×7): qty 0.3

## 2022-01-13 MED ORDER — ATORVASTATIN CALCIUM 40 MG PO TABS
40.0000 mg | ORAL_TABLET | Freq: Every evening | ORAL | Status: DC
Start: 2022-01-13 — End: 2022-01-21
  Administered 2022-01-13 – 2022-01-20 (×7): 40 mg
  Filled 2022-01-13 (×7): qty 1

## 2022-01-13 MED ORDER — TAMSULOSIN HCL 0.4 MG PO CAPS
0.4000 mg | ORAL_CAPSULE | Freq: Every day | ORAL | Status: DC
  Administered 2022-01-14 – 2022-01-21 (×8): 0.4 mg via ORAL
  Filled 2022-01-13 (×8): qty 1

## 2022-01-13 MED ORDER — MYCOPHENOLATE 200 MG/ML ORAL SUSPENSION
250.0000 mg | Freq: Two times a day (BID) | ORAL | Status: DC
  Administered 2022-01-14 – 2022-01-21 (×14): 250 mg via ORAL
  Filled 2022-01-13 (×18): qty 1.25

## 2022-01-13 NOTE — H&P (Cosign Needed)
Date: 01/13/2022               Patient Name:  Arthur Burke MRN: [id number]  DOB: [birth year] Age / Sex: 56 y.o., male   PCP: Pcp, No         Medical Service: Internal Medicine Teaching Service         Attending Physician: Dr. Sid Falcon, MD    First Contact: Farrel Gordon, DO Pager: ED [phone]  Second Contact: Sanjuan Dame, MD Pager: PB [phone]       After Hours (After 5p/  First Contact Pager: [phone]  weekends / holidays): Second Contact Pager: [phone]   SUBJECTIVE   Chief Complaint: Leg and neck swelling  History of Present Illness:   Arthur Burke is a 56 y.o. 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 He says that he finished his course of radiation therapy on Friday. Has had neck swelling in the past after radiation therapies, but believes the swelling today is worse. The reason he decided to come in was that his right lower extremity started weeping it was so swollen and his left lower leg didn't fit in his prosthesis anymore. He used to be on lasix for this but at some point these were stopped and he didn't feel like they were working anymore anyways. He hasn't sure if he wasn't absorbing it well enough since he was taking it through his tube rather than by mouth. He endorses difficulty laying flat for some time because of his radiation and coughing but does not feel like this has been any worse recently. He denies any shortness of breath or worsening of his ability to swallow his spit/liquids. Denies chest pain, dizziness or lightheaded with walking. He does feel like he gets some swelling in his buttocks and arms if he does not get up and move around. Has no difficulty ambulating when  his prosthesis fits. He does endorse some decreased urination since radiation (past 7 weeks).  Meds:  Amlodipine 10 mg daily Lipitor 40 mg daily Coreg 25 mg daily BID Humalog TID 0-9 units Glargine 22 units Lactulose PRN constipation Cellcept 1.23ms BID Omeprazole 40 mg daily Zofran 4 mg BID PRN nausea Miralax PRN Prednisone 5 mg daily Tacrolimus oral suspension 1 mL BID (only medication taken by mouth) Free water 60 mL 5 times daily  Past Medical History:  Diagnosis Date   Acute osteomyelitis of left calcaneus (HCC)    Cancer (HCC)    Diabetes mellitus without complication ([facility]    Diabetic foot infection ([facility]    Hypertension    Infected ulcer of skin, with fat layer exposed ([facility]    Lower limb ulcer, ankle, left, with necrosis of muscle (HCC)    MRSA (methicillin resistant staph aureus) culture positive    Renal disorder    Vision impairment     Past Surgical History:  Procedure Laterality Date   AMPUTATION Left 08/23/2017   Procedure: AMPUTATION BELOW KNEE;  Surgeon: DNewt Minion MD;  [facility]  Service: Orthopedics;  Laterality: Left;   APPLICATION OF WOUND VAC Left 08/23/2017   Procedure: APPLICATION OF WOUND VAC;  Surgeon: DNewt Minion MD;  [facility]  Service: Orthopedics;  Laterality: Left;   IR GASTROSTOMY TUBE MOD SED  10/22/2021   IR IMAGING GUIDED PORT INSERTION  10/22/2021   KIDNEY TRANSPLANT  2006   [facility]   SKIN GRAFT     L diabetic foot wound with debriedment and skin grafting   TOE AMPUTATION     R great and 2nd toe    Social:  Lives With: Brother Occupation: Disability Support: Family in the area Level of Function: Able to perform ADL/IADL's PCP:  [facility] Substances: 1.5 cans tobacco daily (stopped in March), Smoked 1PPD. Denies alcohol or other substance use.   Family History:  No family history of cancer, diabetes, heart attacks or strokes  Allergies: Allergies as of 01/13/2022 - Review Complete 01/13/2022  Allergen Reaction  Noted   Zolpidem Other (See Comments) 08/21/2017    Review of Systems: A complete ROS was negative except as per HPI.   OBJECTIVE:   Physical Exam: Blood pressure (!) 159/83, pulse 67, temperature 98.4 F (36.9 C), temperature source Oral, resp. rate 14, SpO2 99 %.  Constitutional:chronically ill appearing male, in no acute distress HEENT: large neck mass with superficial edema and radiation skin changes Cardiovascular: regular rate and rhythm, systolic murmur best heard over the heart apex Pulmonary/Chest: normal work of breathing on room air, lungs clear to auscultation bilaterally Abdominal: soft, non-tender, non-distended, G tube in place MSK: normal bulk and tone Neurological: alert & oriented x 3, answering questions appropriately Skin: warm and dry Psych: normal affect  Labs: CBC    Component Value Date/Time   WBC 9.2 01/13/2022 1630   RBC 2.82 (L) 01/13/2022 1630   HGB 8.3 (L) 01/13/2022 1630   HGB 13.7 06/19/2013 2156   HCT 24.7 (L) 01/13/2022 1630   HCT 39.1 (L) 06/19/2013 2156   PLT 370 01/13/2022 1630   PLT 252 06/19/2013 2156   MCV 87.6 01/13/2022 1630   MCV 84 06/19/2013 2156   MCH 29.4 01/13/2022 1630   MCHC 33.6 01/13/2022 1630   RDW 16.4 (H) 01/13/2022 1630   RDW 14.1 06/19/2013 2156   LYMPHSABS 2.1 12/22/2021 0402   LYMPHSABS 4.8 (H) 04/21/2013 0117   MONOABS 1.1 (H) 12/22/2021 0402   MONOABS 2.3 (H) 04/21/2013 0117   EOSABS 0.0 12/22/2021 0402   EOSABS 0.0 04/21/2013 0117   BASOSABS 0.0 12/22/2021 0402   BASOSABS 0.1 04/21/2013 0117     CMP     Component Value Date/Time   NA 126 (L) 01/13/2022 1630   NA 128 (L) 12/03/2021 1425   NA 119 (LL) 06/19/2013 2156   K 4.1 01/13/2022 1630   K 3.9 06/19/2013 2156   CL 87 (L) 01/13/2022 1630   CL 86 (L) 06/19/2013 2156   CO2 29 01/13/2022 1630   CO2 30 06/19/2013 2156   GLUCOSE 278 (H) 01/13/2022 1630   GLUCOSE 69 06/19/2013 2156   BUN 72 (H) 01/13/2022 1630   BUN 76 (HH) 12/03/2021 1425   BUN  9 06/19/2013 2156   CREATININE 1.94 (H) 01/13/2022 1630   CREATININE 2.42 (H) 12/10/2021 1507   CREATININE 0.82 06/19/2013 2156   CALCIUM 8.8 (L) 01/13/2022 1630   CALCIUM 9.9 06/19/2013 2156   PROT 6.3 (L) 01/13/2022 1630   PROT 8.0 06/19/2013 2156   ALBUMIN 2.4 (L) 01/13/2022 1630   ALBUMIN 4.2 06/19/2013 2156   AST 16 01/13/2022 1630   AST 27 06/19/2013 2156   ALT 11 01/13/2022 1630   ALT 23 06/19/2013 2156   ALKPHOS 125 01/13/2022 1630   ALKPHOS  99 06/19/2013 2156   BILITOT 0.7 01/13/2022 1630   BILITOT 0.9 06/19/2013 2156   GFRNONAA 40 (L) 01/13/2022 1630   GFRNONAA 31 (L) 12/10/2021 1507   GFRNONAA >60 06/19/2013 2156   GFRAA >60 08/29/2017 0717   GFRAA >60 06/19/2013 2156    Imaging: DG Chest 2 View  Result Date: 01/13/2022 CLINICAL DATA:  Edema in the lower extremity EXAM: CHEST - 2 VIEW COMPARISON:  12/20/2021 FINDINGS: Transverse diameter of heart is slightly increased. There are no signs of pulmonary edema or focal pulmonary consolidation. Patient's chin is partly obscuring the apices. Left lateral CP angle is not included. As far as seen, there is no significant pleural effusion or pneumothorax. Right IJ chest port is seen with its tip at the junction of superior vena cava and right atrium. Old fracture is seen in the right clavicle. IMPRESSION: No active cardiopulmonary disease. Electronically Signed   By: Elmer Picker M.D.   On: 01/13/2022 17:27    EKG: personally reviewed my interpretation is sinus rhythm, incomplete right bundle branch block, T wave inversion in V1 unchanged from prior  ASSESSMENT & PLAN:   Assessment & Plan by Problem: Principal Problem:   Hyponatremia   BRODERIC BARA is a 56 y.o. 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  #Acute on chronic lower extremity edema #Malnutrition G-tube dependent Patient is coming in with acute on chronic lower extremity swelling in the setting of hyponatremia and low albumin. He is at risk for malnutrition given his G tube status. His swelling is likely related to chronic venous insufficiency and has acutely worsened due to his drop in sodium causing third spacing. Per nephrology's last note lasix likely ineffective as he is intravascularly depleted. Holding free water for now pending further labs.  - dietician consult - hold free water flushes - f/u urine studies - full liquid diet  #Moderate Hyponatremia Sodium on admission 126. Patient has previously had hyponatremia during march admission and was placed on salt tablets at that time. Like related to poor PO intake and or too much free water intake. He may need to be restarted on his salt tablets. - f/u urine studies - daily bmp - sodium goal no > than 134 for the next 24 hours  #Squamous cell carcinoma s/p radiation treament #Neck swelling Patient is able to manage his secretions and has no sign of airway compromise. Passed bedside swallow. Last SLP evaluation/input was 5/15 at [facility].  - CTM - liquid diet  #Deconditioning Per patient he has no difficulty with his ADLs/getting around however per chart review patient's radiation treatments were delayed by 1 week in May. He missed 5 treatments at that time due to weakness and deconditioning. He became wheelchair bound and had a stage 1 pressure ulcer. - recommend reexamining patient in the morning to assess for healing/progression of ulcer - PT/OT eval and treat  #AKI #ESRD s/p renal tranplant Patient has had decreasing urine output since starting radiation therapy. His renal function has been  monitored closely as it has been worsening lately however it had improved to 1.3-1.4 about two weeks ago. Today  on admission it is 1.94.  - CTM - avoid nephrotoxic medications - daily bmp - continue prednisone and tacrolimus and mycophenolate - f/u urine sodium, orine osms, urine sodium, urinalysis - continue flomax  #T1DM Patient reports taking 20 units of glargine daily. Of note during last admission blood sugars were labile. - sensitive sliding scale - 20 units insulin daily  #Anemia Chronic and stable, no signs of active bleeding - daily cbc - transfuse <7  #HTN Continue home coreg 25, clonidine 0.1, and and amlodipine 10  #Chronic pain Continued home oxycodone  #GERD Continue home omeprazole  #HLD Continue home atorvastatin  Diet: Tube Feeds/liquid diet  VTE: Enoxaparin IVF: None,None Code: Full  Prior to Admission Living Arrangement: Home, living alone Anticipated Discharge Location: Home Barriers to Discharge: continued medical workup  Dispo: Admit patient to Observation with expected length of stay less than 2 midnights.  Signed: Scarlett Presto, MD Internal Medicine Resident PGY-1  01/13/2022, 11:22 PM

## 2022-01-13 NOTE — ED Provider Triage Note (Signed)
Emergency Medicine Provider Triage Evaluation Note  Arthur Burke , a 56 y.o. male  was evaluated in triage.  Pt complains of edema.  He is status post left BKA.  He reports that his right leg has been swollen and weeping.  He was taken off his furosemide about a month ago.  He is just finished his final radiation treatment for cancer.  He does feel like he has some general body wide swelling.  No fevers.   Physical Exam  BP (!) 156/65 (BP Location: Right Arm)   Pulse 63   Temp 98.4 F (36.9 C) (Oral)   Resp 18   SpO2 95%  Gen:   Awake, no distress   Resp:  Normal effort  MSK:   Status post left BKA.  Right leg is diffusely edematous without being erythematous. Other:  Swelling around face and neck, patient and visitor reports that this is not acute and has been ongoing since his treatment started.  Medical Decision Making  Medically screening exam initiated at 4:41 PM.  Appropriate orders placed.  [name] was informed that the remainder of the evaluation will be completed by another provider, this initial triage assessment does not replace that evaluation, and the importance of remaining in the ED until their evaluation is complete.     Lorin Glass, [place] 01/13/22 1643

## 2022-01-13 NOTE — ED Provider Notes (Signed)
[facility] EMERGENCY DEPARTMENT Provider Note   CSN: [id number] Arrival date & time: 01/13/22  1601     History  Chief Complaint  Patient presents with   Leg Swelling    Arthur Burke is a 56 y.o. male.  56 yo M with a chief complaints of feeling fatigued and feeling like his leg and neck are a bit more swollen than they typically are.  Has had radiation for throat cancer and after which has had some swelling to that area.  He feels like its been getting worse over the past couple days.  Status post renal transplant some 30 years ago.  Has had chronic kidney disease.  He has not been able to eat or drink and has been G-tube dependent.       Home Medications Prior to Admission medications   Medication Sig Start Date End Date Taking? Authorizing Provider  amLODipine (NORVASC) 10 MG tablet Place 1 tablet (10 mg total) into feeding tube daily. 11/01/21   Orvis Brill, MD  atorvastatin (LIPITOR) 40 MG tablet Place 1 tablet (40 mg total) into feeding tube every evening. 10/30/21   Demaio, Alexa, MD  carvedilol (COREG) 25 MG tablet Place 1 tablet (25 mg total) into feeding tube 2 (two) times daily. 10/30/21   Demaio, Alexa, MD  cloNIDine (CATAPRES) 0.1 MG tablet Place 1 tablet (0.1 mg total) into feeding tube 2 (two) times daily. 10/30/21   Scarlett Presto, MD  Continuous Blood Gluc Receiver (Standard) [place] 1 Product by Does not apply route once a week. 11/01/21   Orvis Brill, MD  Continuous Blood Gluc Sensor (DEXCOM G7 SENSOR) MISC 1 Product by Does not apply route once a week. Check every 10 days 11/01/21   Orvis Brill, MD  HUMALOG KWIKPEN 100 UNIT/ML KwikPen Inject 0-9 Units into the skin 3 (three) times daily with meals. Correction coverage: Sensitive (thin, NPO, renal) CBG < 70: Implement Hypoglycemia protocol, CBG 70 - 120: 0 units CBG 121 - 150: 1 unit CBG 151 - 200: 2 units CBG 201 - 250: 3 units CBG 251 - 300: 5 units CBG 301 - 350: 7 units  CBG 351 - 400: 9 units CBG > 400: call MD. 12/25/21   Modena Jansky, MD  insulin glargine (LANTUS) 100 unit/mL SOPN Inject 22 Units into the skin daily. 12/26/21   Hongalgi, Lenis Dickinson, MD  lactulose (CHRONULAC) 10 GM/15ML solution Place 15 mLs (10 g total) into feeding tube daily as needed for moderate constipation. 12/25/21   Hongalgi, Lenis Dickinson, MD  lidocaine (XYLOCAINE) 2 % solution Patient: Mix 1part 2% viscous lidocaine, 1part H20. Swish & swallow 34m of diluted mixture, 314m before meals and at bedtime, up to QID Patient taking differently: 15 mLs every 6 (six) hours as needed for mouth pain. Patient: Mix 1part 2% viscous lidocaine, 1part H20. Swish & swallow 1063mf diluted mixture, 80m75mefore meals and at bedtime, up to QID 11/26/21   SquiEppie Gibson  Multiple Vitamins-Minerals (VITRUM SENIOR) TABS Place 1 tablet into feeding tube daily. 10/30/21   Demaio, Alexa, MD  mycophenolate (CELLCEPT) 200 MG/ML suspension Place 1.3 mLs (260 mg total) into feeding tube 2 (two) times daily. 12/25/21   Hongalgi, AnanLenis Dickinson  Nutritional Supplements (FEEDING SUPPLEMENT, NEPRO CARB STEADY,) LIQD Place 237 mLs into feeding tube 5 (five) times daily. 12/25/21   Hongalgi, AnanLenis Dickinson  omeprazole (PRILOSEC) 40 MG capsule Take 1 capsule (40 mg total) by  mouth daily. Per tube 12/25/21   Hongalgi, Lenis Dickinson, MD  ondansetron (ZOFRAN) 4 MG tablet Place 1 tablet (4 mg total) into feeding tube 2 (two) times daily as needed for nausea or vomiting. 12/25/21   Hongalgi, Lenis Dickinson, MD  polyethylene glycol (MIRALAX / GLYCOLAX) 17 g packet Place 17 g into feeding tube daily as needed for mild constipation. 10/30/21   Demaio, Alexa, MD  predniSONE (DELTASONE) 5 MG tablet Place 5 mg into feeding tube daily. 12/16/21   [provider]  tacrolimus (PROGRAF) oral suspension 1 mg/mL mixture Take 1 mL (1 mg) by mouth 2 (two) times daily. Patient not taking: Reported on 12/20/2021 11/07/21   Orvis Brill, MD  tamsulosin (FLOMAX)  0.4 MG CAPS capsule Take 1 capsule (0.4 mg total) by mouth daily. 12/26/21   Hongalgi, Lenis Dickinson, MD  vitamin B-12 (CYANOCOBALAMIN) 500 MCG tablet Place 1 tablet (500 mcg total) into feeding tube daily. 10/30/21   Demaio, Roxine Caddy, MD  Water For Irrigation, Sterile (FREE WATER) SOLN Place 60 mLs into feeding tube 5 (five) times daily. 12/25/21   Hongalgi, Lenis Dickinson, MD  Wound Dressings (PRUTECT) EMUL Apply 1 application. topically 2 (two) times daily as needed (Apply to redness/hyperpigmentation in neck from radiation at least 2x day. Call Dr. Pearlie Oyster nurse at [phone] if you have questions). 12/25/21   Hongalgi, Lenis Dickinson, MD      Allergies    Zolpidem    Review of Systems   Review of Systems  Physical Exam Updated Vital Signs BP (!) 159/75   Pulse 64   Temp 98.4 F (36.9 C) (Oral)   Resp 16   SpO2 100%  Physical Exam Vitals and nursing note reviewed.  Constitutional:      Appearance: He is well-developed.  HENT:     Head: Normocephalic and atraumatic.     Mouth/Throat:     Comments: Diffusely poor dentition  Fullness underneath the jaw more on the right than the left.  Uvula is midline slightly whitish.  Tolerating secretions without difficulty. Eyes:     Pupils: Pupils are equal, round, and reactive to light.  Neck:     Vascular: No JVD.  Cardiovascular:     Rate and Rhythm: Normal rate and regular rhythm.     Heart sounds: No murmur heard.   No friction rub. No gallop.  Pulmonary:     Effort: No respiratory distress.     Breath sounds: No wheezing.  Abdominal:     General: There is no distension.     Tenderness: There is no abdominal tenderness. There is no guarding or rebound.  Musculoskeletal:        General: Normal range of motion.     Cervical back: Normal range of motion and neck supple.     Right lower leg: Edema present.     Comments: 3+ edema to the right lower extremity up to the thigh.  A small ulceration to the right calf with some watery drainage left BKA  Skin:     Coloration: Skin is not pale.     Findings: No rash.  Neurological:     Mental Status: He is alert and oriented to person, place, and time.  Psychiatric:        Behavior: Behavior normal.    ED Results / Procedures / Treatments   Labs (all labs ordered are listed, but only abnormal results are displayed) Labs Reviewed  BASIC METABOLIC PANEL - Abnormal; Notable for the following components:  Result Value   Sodium 126 (*)    Chloride 87 (*)    Glucose, Bld 278 (*)    BUN 72 (*)    Creatinine, Ser 1.94 (*)    Calcium 8.8 (*)    GFR, Estimated 40 (*)    All other components within normal limits  CBC - Abnormal; Notable for the following components:   RBC 2.82 (*)    Hemoglobin 8.3 (*)    HCT 24.7 (*)    RDW 16.4 (*)    All other components within normal limits  BRAIN NATRIURETIC PEPTIDE - Abnormal; Notable for the following components:   B Natriuretic Peptide 663.4 (*)    All other components within normal limits  HEPATIC FUNCTION PANEL - Abnormal; Notable for the following components:   Total Protein 6.3 (*)    Albumin 2.4 (*)    All other components within normal limits  OSMOLALITY    EKG EKG Interpretation  Date/Time:  Sunday January 13 2022 16:42:51 EDT Ventricular Rate:  62 PR Interval:  170 QRS Duration: 94 QT Interval:  432 QTC Calculation: 438 R Axis:   -20 Text Interpretation: Normal sinus rhythm Incomplete right bundle branch block Borderline ECG When compared with ECG of 20-Dec-2021 19:38, PREVIOUS ECG IS PRESENT Confirmed by Noemi Chapel [phone]) on 01/13/2022 5:52:46 PM  Radiology DG Chest 2 View  Result Date: 01/13/2022 CLINICAL DATA:  Edema in the lower extremity EXAM: CHEST - 2 VIEW COMPARISON:  12/20/2021 FINDINGS: Transverse diameter of heart is slightly increased. There are no signs of pulmonary edema or focal pulmonary consolidation. Patient's chin is partly obscuring the apices. Left lateral CP angle is not included. As far as seen, there is no  significant pleural effusion or pneumothorax. Right IJ chest port is seen with its tip at the junction of superior vena cava and right atrium. Old fracture is seen in the right clavicle. IMPRESSION: No active cardiopulmonary disease. Electronically Signed   By: Elmer Picker M.D.   On: 01/13/2022 17:27    Procedures Procedures    Medications Ordered in ED Medications - No data to display  ED Course/ Medical Decision Making/ A&P                           Medical Decision Making Amount and/or Complexity of Data Reviewed Labs: ordered. Radiology: ordered.  Risk Decision regarding hospitalization.   56 yo M with a chief complaint of just not feeling right and having more swelling than normal.  This been going on for a couple days and worsening.  He thinks his problem is more fluid than anything else.  He has had some swelling underneath his jaw since he had radiation therapy.  On exam the patient does have some significant edema worse on the right than the left.  He is maintaining his airway without issue and has no issues swallowing.  His lab work his with mild worsening of his renal function.  His sodium is quite low at 126.  I wonder if this is due to his tube feeds and not getting enough sodium nutritionally.  Could be causing his edema and his symptoms.  We will discuss with internal medicine.  The patients results and plan were reviewed and discussed.   Any x-rays performed were independently reviewed by myself.   Differential diagnosis were considered with the presenting HPI.  Medications - No data to display  Vitals:   01/13/22 1810 01/13/22 1815 01/13/22 1830  01/13/22 1845  BP: (!) 163/69 (!) 161/71 (!) 158/72 (!) 159/75  Pulse: 63 64 65 64  Resp: 11 (!) '24 19 16  '$ Temp:      TempSrc:      SpO2: 98% 99% 97% 100%    Final diagnoses:  Peripheral edema  Hyponatremia    Admission/ observation were discussed with the admitting physician, patient and/or family and  they are comfortable with the plan.          Final Clinical Impression(s) / ED Diagnoses Final diagnoses:  Peripheral edema  Hyponatremia    Rx / DC Orders ED Discharge Orders     None         Deno Etienne, DO 01/13/22 1857

## 2022-01-13 NOTE — ED Triage Notes (Signed)
Patient complains of leg swelling and edema all over.

## 2022-01-14 DIAGNOSIS — E10649 Type 1 diabetes mellitus with hypoglycemia without coma: Secondary | ICD-10-CM | POA: Diagnosis not present

## 2022-01-14 DIAGNOSIS — E1065 Type 1 diabetes mellitus with hyperglycemia: Secondary | ICD-10-CM | POA: Diagnosis present

## 2022-01-14 DIAGNOSIS — E871 Hypo-osmolality and hyponatremia: Secondary | ICD-10-CM | POA: Diagnosis present

## 2022-01-14 DIAGNOSIS — Z931 Gastrostomy status: Secondary | ICD-10-CM | POA: Diagnosis not present

## 2022-01-14 DIAGNOSIS — E440 Moderate protein-calorie malnutrition: Secondary | ICD-10-CM | POA: Diagnosis present

## 2022-01-14 DIAGNOSIS — E1022 Type 1 diabetes mellitus with diabetic chronic kidney disease: Secondary | ICD-10-CM | POA: Diagnosis present

## 2022-01-14 DIAGNOSIS — Z923 Personal history of irradiation: Secondary | ICD-10-CM | POA: Diagnosis not present

## 2022-01-14 DIAGNOSIS — I120 Hypertensive chronic kidney disease with stage 5 chronic kidney disease or end stage renal disease: Secondary | ICD-10-CM | POA: Diagnosis present

## 2022-01-14 DIAGNOSIS — D631 Anemia in chronic kidney disease: Secondary | ICD-10-CM | POA: Diagnosis present

## 2022-01-14 DIAGNOSIS — E1040 Type 1 diabetes mellitus with diabetic neuropathy, unspecified: Secondary | ICD-10-CM | POA: Diagnosis present

## 2022-01-14 DIAGNOSIS — T8619 Other complication of kidney transplant: Secondary | ICD-10-CM | POA: Diagnosis present

## 2022-01-14 DIAGNOSIS — E785 Hyperlipidemia, unspecified: Secondary | ICD-10-CM | POA: Diagnosis present

## 2022-01-14 DIAGNOSIS — N179 Acute kidney failure, unspecified: Secondary | ICD-10-CM | POA: Diagnosis present

## 2022-01-14 DIAGNOSIS — N1330 Unspecified hydronephrosis: Secondary | ICD-10-CM | POA: Diagnosis present

## 2022-01-14 DIAGNOSIS — N1831 Chronic kidney disease, stage 3a: Secondary | ICD-10-CM | POA: Diagnosis present

## 2022-01-14 DIAGNOSIS — I472 Ventricular tachycardia, unspecified: Secondary | ICD-10-CM | POA: Diagnosis not present

## 2022-01-14 DIAGNOSIS — Z89512 Acquired absence of left leg below knee: Secondary | ICD-10-CM | POA: Diagnosis not present

## 2022-01-14 DIAGNOSIS — D84821 Immunodeficiency due to drugs: Secondary | ICD-10-CM | POA: Diagnosis present

## 2022-01-14 DIAGNOSIS — M7989 Other specified soft tissue disorders: Secondary | ICD-10-CM | POA: Diagnosis present

## 2022-01-14 DIAGNOSIS — K219 Gastro-esophageal reflux disease without esophagitis: Secondary | ICD-10-CM | POA: Diagnosis present

## 2022-01-14 DIAGNOSIS — L89151 Pressure ulcer of sacral region, stage 1: Secondary | ICD-10-CM | POA: Diagnosis present

## 2022-01-14 DIAGNOSIS — C109 Malignant neoplasm of oropharynx, unspecified: Secondary | ICD-10-CM | POA: Diagnosis present

## 2022-01-14 DIAGNOSIS — Y830 Surgical operation with transplant of whole organ as the cause of abnormal reaction of the patient, or of later complication, without mention of misadventure at the time of the procedure: Secondary | ICD-10-CM | POA: Diagnosis present

## 2022-01-14 DIAGNOSIS — Z6820 Body mass index (BMI) 20.0-20.9, adult: Secondary | ICD-10-CM | POA: Diagnosis not present

## 2022-01-14 DIAGNOSIS — J690 Pneumonitis due to inhalation of food and vomit: Secondary | ICD-10-CM | POA: Diagnosis not present

## 2022-01-14 DIAGNOSIS — E8809 Other disorders of plasma-protein metabolism, not elsewhere classified: Secondary | ICD-10-CM | POA: Diagnosis present

## 2022-01-14 LAB — GLUCOSE, CAPILLARY
Glucose-Capillary: 168 mg/dL — ABNORMAL HIGH (ref 70–99)
Glucose-Capillary: 215 mg/dL — ABNORMAL HIGH (ref 70–99)

## 2022-01-14 LAB — URINALYSIS, COMPLETE (UACMP) WITH MICROSCOPIC
Bilirubin Urine: NEGATIVE
Glucose, UA: 50 mg/dL — AB
Hgb urine dipstick: NEGATIVE
Ketones, ur: NEGATIVE mg/dL
Nitrite: NEGATIVE
Protein, ur: >=300 mg/dL — AB
Specific Gravity, Urine: 1.011 (ref 1.005–1.030)
pH: 5 (ref 5.0–8.0)

## 2022-01-14 LAB — OSMOLALITY, URINE: Osmolality, Ur: 321 mOsm/kg (ref 300–900)

## 2022-01-14 LAB — BASIC METABOLIC PANEL
Anion gap: 9 (ref 5–15)
BUN: 74 mg/dL — ABNORMAL HIGH (ref 6–20)
CO2: 28 mmol/L (ref 22–32)
Calcium: 8.6 mg/dL — ABNORMAL LOW (ref 8.9–10.3)
Chloride: 89 mmol/L — ABNORMAL LOW (ref 98–111)
Creatinine, Ser: 1.93 mg/dL — ABNORMAL HIGH (ref 0.61–1.24)
GFR, Estimated: 40 mL/min — ABNORMAL LOW (ref >60)
Glucose, Bld: 299 mg/dL — ABNORMAL HIGH (ref 70–99)
Potassium: 4.2 mmol/L (ref 3.5–5.1)
Sodium: 126 mmol/L — ABNORMAL LOW (ref 135–145)

## 2022-01-14 LAB — CBG MONITORING, ED
Glucose-Capillary: 298 mg/dL — ABNORMAL HIGH (ref 70–99)
Glucose-Capillary: 138 mg/dL — ABNORMAL HIGH (ref 70–99)
Glucose-Capillary: 153 mg/dL — ABNORMAL HIGH (ref 70–99)

## 2022-01-14 LAB — CREATININE, URINE, RANDOM: Creatinine, Urine: 71.42 mg/dL

## 2022-01-14 LAB — OSMOLALITY: Osmolality: 300 mOsm/kg — ABNORMAL HIGH (ref 275–295)

## 2022-01-14 LAB — SODIUM, URINE, RANDOM: Sodium, Ur: 21 mmol/L

## 2022-01-14 MED ORDER — FUROSEMIDE 10 MG/ML IJ SOLN
40.0000 mg | Freq: Once | INTRAMUSCULAR | Status: AC
Start: 2022-01-14 — End: 2022-01-14
  Administered 2022-01-14: 40 mg via INTRAVENOUS
  Filled 2022-01-14: qty 4

## 2022-01-14 MED ORDER — INSULIN GLARGINE-YFGN 100 UNIT/ML ~~LOC~~ SOLN
22.0000 [IU] | Freq: Every day | SUBCUTANEOUS | Status: DC
  Administered 2022-01-14: 22 [IU] via SUBCUTANEOUS
  Filled 2022-01-14 (×2): qty 0.22

## 2022-01-14 MED ORDER — OXYCODONE HCL 5 MG PO TABS
20.0000 mg | ORAL_TABLET | ORAL | Status: DC | PRN
  Administered 2022-01-14 – 2022-01-20 (×26): 20 mg
  Filled 2022-01-14 (×26): qty 4

## 2022-01-14 MED ORDER — INSULIN ASPART 100 UNIT/ML IJ SOLN
0.0000 [IU] | Freq: Three times a day (TID) | INTRAMUSCULAR | Status: DC
  Administered 2022-01-14: 2 [IU] via SUBCUTANEOUS
  Administered 2022-01-14 – 2022-01-15 (×2): 1 [IU] via SUBCUTANEOUS
  Administered 2022-01-16: 5 [IU] via SUBCUTANEOUS

## 2022-01-14 MED ORDER — OXYCODONE HCL 5 MG PO TABS
20.0000 mg | ORAL_TABLET | Freq: Four times a day (QID) | ORAL | Status: DC | PRN
  Administered 2022-01-14: 20 mg
  Filled 2022-01-14: qty 4

## 2022-01-14 NOTE — ED Notes (Signed)
Requested urine sample from pt. Pt given urinal. Pt stated he would attempt to give sample.

## 2022-01-14 NOTE — ED Notes (Signed)
Attempted to obtain urine sample from pt again. Pt unable to void at this time but stated he will try again soon.

## 2022-01-14 NOTE — Evaluation (Signed)
Occupational Therapy Evaluation Patient Details Name: Arthur Burke MRN: [id number] DOB: 12/03/1965 Today's Date: 01/14/2022   History of Present Illness Pt is a 56 y/o male presenting on 6/4 with LE edema and throat swelling. Admitted with hyponatremia, AKI. 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   Clinical Impression   Patient admitted for above and presents with problem list below, including impaired vision, generalized weakness, decreased activity tolerance and impaired balance, as well as decreased safety awareness. Patient reports recently using his w/c for mobility at home for the past week, as his prosthetic hasn't been fitting from edema; completing transfers and ADLs with independence (basin bathing and using BSC as w/c doesn't fit in bathroom). Currently requires min guard for stand pivot transfers to w/c given cueing for increased safety and technique, setup to min assist for seated ADLs and up to max assist for LB dressing/toileting.  He reports his vision is at baseline, but demonstrates significant difficulty locating items, seeing items without contrast or in low light making requiring at least min cueing due to visual deficits during ADLs. Patient will benefit from continued OT services while admitted and after dc at SNF level to optimize independence, safety with ADLs, mobility prior to dc home. Will follow.       Recommendations for follow up therapy are one component of a multi-disciplinary discharge planning process, led by the attending physician.  Recommendations Arthur be updated based on patient status, additional functional criteria and insurance authorization.   Follow Up Recommendations  Skilled nursing-short term rehab (<3 hours/day)    Assistance Recommended at Discharge Frequent or constant Supervision/Assistance  Patient can  return home with the following A little help with walking and/or transfers;A lot of help with bathing/dressing/bathroom;Assistance with cooking/housework;Direct supervision/assist for medications management;Direct supervision/assist for financial management;Assist for transportation;Help with stairs or ramp for entrance    Functional Status Assessment  Patient has had a recent decline in their functional status and demonstrates the ability to make significant improvements in function in a reasonable and predictable amount of time.  Equipment Recommendations  Other (comment) (defer)    Recommendations for Other Services       Precautions / Restrictions Precautions Precautions: Fall Precaution Comments: Lt BKA, peg Restrictions Weight Bearing Restrictions: No      Mobility Bed Mobility               General bed mobility comments: OOB on BSC upon entry    Transfers                          Balance Overall balance assessment: Needs assistance Sitting-balance support: No upper extremity supported, Feet supported Sitting balance-Leahy Scale: Good     Standing balance support: Bilateral upper extremity supported, During functional activity Standing balance-Leahy Scale: Poor Standing balance comment: relies on BUE support                           ADL either performed or assessed with clinical judgement   ADL Overall ADL's : Needs assistance/impaired     Grooming: Sitting;Minimal assistance Grooming Details (indicate cue type and reason): due to visual deficits         Upper Body Dressing : Sitting;Set up   Lower Body Dressing: Moderate assistance;Sit to/from stand Lower Body Dressing Details (indicate  cue type and reason): pt requires assist for clothing over hips in standing, able to manage R sock in sitting with supervision Toilet Transfer: Min guard;Stand-pivot;BSC/3in1 Toilet Transfer Details (indicate cue type and reason): simulated with  cueing for technique Toileting- Clothing Manipulation and Hygiene: Maximal assistance;Sit to/from stand Toileting - Clothing Manipulation Details (indicate cue type and reason): clothing mgmt in standing     Functional mobility during ADLs: Min guard;Cueing for safety;Wheelchair General ADL Comments: stand pivot from [facility] to stretcher, stretcher to [facility].  Cueing for technique during transfers.     Vision Patient Visual Report: No change from baseline Additional Comments: pt reports visual deficits from diabetes- pt presents with very difficult time seeing items even when placed in front of him taking several attempts to grab.  Requires hand over hand to manage wheelchair arm rests.  Appears baseline, but would benefit from services of the blind resoucses.     Perception     Praxis      Pertinent Vitals/Pain Pain Assessment Pain Descriptors / Indicators: Discomfort Pain Intervention(s): Limited activity within patient's tolerance, Monitored during session, Repositioned     Hand Dominance Right   Extremity/Trunk Assessment Upper Extremity Assessment Upper Extremity Assessment: Generalized weakness;LUE deficits/detail LUE Deficits / Details: L UE weaker than R UE, grossly 3/5 MMT.  Report ring finger numbness. LUE Sensation: WNL LUE Coordination: decreased fine motor   Lower Extremity Assessment Lower Extremity Assessment: Defer to PT evaluation       Communication Communication Communication: No difficulties   Cognition Arousal/Alertness: Awake/alert Behavior During Therapy: WFL for tasks assessed/performed Overall Cognitive Status: No family/caregiver present to determine baseline cognitive functioning Area of Impairment: Safety/judgement, Awareness, Problem solving                         Safety/Judgement: Decreased awareness of safety Awareness: Emergent Problem Solving: Slow processing, Requires verbal cues General Comments: pt with decreased safety awareness  and problem sovling     General Comments  VSS    Exercises     Shoulder Instructions      Home Living Family/patient expects to be discharged to:: Private residence Living Arrangements: Alone Available Help at Discharge: Family;Friend(s);Available PRN/intermittently Type of Home: Apartment Home Access: Level entry     Home Layout: One level     Bathroom Shower/Tub: Teacher, early years/pre: Handicapped height Bathroom Accessibility: No (wheelchair doesnt fit but he can walk into bathroom if prostheitc fits)   Home Equipment: Conservation officer, nature (2 wheels);Wheelchair Probation officer (4 wheels);Tub bench;BSC/3in1;Grab bars - tub/shower          Prior Functioning/Environment Prior Level of Function : Independent/Modified Independent             Mobility Comments: use of wheelchair all of las\t week because of swelling in prosthetic limb; Modif I fromj wheelchair ADLs Comments: Mod indep with ADL/iADLs; does not drive, sink baths        OT Problem List: Decreased strength;Decreased activity tolerance;Impaired balance (sitting and/or standing);Impaired vision/perception;Decreased coordination;Decreased safety awareness;Decreased knowledge of use of DME or AE;Decreased knowledge of precautions;Increased edema      OT Treatment/Interventions: Therapeutic exercise;Self-care/ADL training;DME and/or AE instruction;Therapeutic activities;Visual/perceptual remediation/compensation;Patient/family education;Balance training    OT Goals(Current goals can be found in the care plan section) Acute Rehab OT Goals Patient Stated Goal: get stronger, go to rehab before home OT Goal Formulation: With patient Time For Goal Achievement: 01/28/22 Potential to Achieve Goals: Good  OT Frequency: Min 2X/week  Co-evaluation PT/OT/SLP Co-Evaluation/Treatment: Yes Reason for Co-Treatment: For patient/therapist safety;To address functional/ADL transfers   OT goals addressed during  session: ADL's and self-care      AM-PAC OT "6 Clicks" Daily Activity     Outcome Measure Help from another person eating meals?: Total (NPO) Help from another person taking care of personal grooming?: A Little Help from another person toileting, which includes using toliet, bedpan, or urinal?: A Lot Help from another person bathing (including washing, rinsing, drying)?: A Lot Help from another person to put on and taking off regular upper body clothing?: A Little Help from another person to put on and taking off regular lower body clothing?: A Lot 6 Click Score: 13   End of Session Nurse Communication: Mobility status  Activity Tolerance: Patient tolerated treatment well Patient left: in chair;with call bell/phone within reach  OT Visit Diagnosis: Other abnormalities of gait and mobility (R26.89);Muscle weakness (generalized) (M62.81)                Time: 1007-1030 OT Time Calculation (min): 23 min Charges:  OT General Charges $OT Visit: 1 Visit OT Evaluation $OT Eval Moderate Complexity: 1 Mod  Jolaine Artist, OT Acute Rehabilitation Services Office [phone]   Delight Stare 01/14/2022, 11:06 AM

## 2022-01-14 NOTE — ED Notes (Signed)
ED TO INPATIENT HANDOFF REPORT  ED Nurse Name and Phone #: Lysbeth Galas [phone]  S Name/Age/Gender Arthur Burke 56 y.o. male Room/Bed: 006C/006C  Code Status   Code Status: Full Code  Home/SNF/Other Home Patient oriented to: self, place, time, and situation Is this baseline? Yes   Triage Complete: Triage complete  Chief Complaint Hyponatremia [E87.1]  Triage Note Patient complains of leg swelling and edema all over.    Allergies Allergies  Allergen Reactions   Zolpidem Other (See Comments)    Other Reaction: CNS Disorder    Level of Care/Admitting Diagnosis ED Disposition     ED Disposition  Admit   Condition  --   Comment  Hospital Area: [facility] [[id number]]  Level of Care: Med-Surg [16]  May admit patient to [facility] or [facility] if equivalent level of care is available:: No  Covid Evaluation: Asymptomatic - no recent exposure (last 10 days) testing not required  Diagnosis: Hyponatremia [198519]  Admitting Physician: Christiana Fuchs [[id number]]  Attending Physician: Sid Falcon [phone]  Estimated length of stay: past midnight tomorrow  Certification:: I certify this patient will need inpatient services for at least 2 midnights          B Medical/Surgery History Past Medical History:  Diagnosis Date   Acute osteomyelitis of left calcaneus ([place])    Cancer ([place])    Diabetes mellitus without complication ([place])    Diabetic foot infection ([place])    Hypertension    Infected ulcer of skin, with fat layer exposed ([place])    Lower limb ulcer, ankle, left, with necrosis of muscle ([place])    MRSA (methicillin resistant staph aureus) culture positive    Renal disorder    Vision impairment    Past Surgical History:  Procedure Laterality Date   AMPUTATION Left 08/23/2017   Procedure: AMPUTATION BELOW KNEE;  Surgeon: Newt Minion, MD;  Location: [facility];  Service: Orthopedics;  Laterality: Left;   APPLICATION OF WOUND VAC Left 08/23/2017    Procedure: APPLICATION OF WOUND VAC;  Surgeon: Newt Minion, MD;  Location: [facility];  Service: Orthopedics;  Laterality: Left;   IR GASTROSTOMY TUBE MOD SED  10/22/2021   IR IMAGING GUIDED PORT INSERTION  10/22/2021   KIDNEY TRANSPLANT  2006   [facility]   SKIN GRAFT     L diabetic foot wound with debriedment and skin grafting   TOE AMPUTATION     R great and 2nd toe     A IV Location/Drains/Wounds Patient Lines/Drains/Airways Status     Active Line/Drains/Airways     Name Placement date Placement time Site Days   Implanted Port 10/22/21 Right Chest 10/22/21  1615  Chest  84   Fistula / Graft Left Forearm Arteriovenous fistula 08/21/17  --  Forearm  1607   Gastrostomy/Enterostomy Percutaneous endoscopic gastrostomy (PEG) 24 Fr. LUQ 10/22/21  1650  LUQ  84   Pressure Injury 12/20/21 Coccyx Bilateral;Medial Stage 1 -  Intact skin with non-blanchable redness of a localized area usually over a bony prominence. Reddened non blanchable area to coccyx and bilat buttocks 12/20/21  2330  -- 25            Intake/Output Last 24 hours  Intake/Output Summary (Last 24 hours) at 01/14/2022 1347 Last data filed at 01/14/2022 1300 Gross per 24 hour  Intake 357 ml  Output 600 ml  Net -243 ml    Labs/Imaging Results for orders placed or performed during the hospital encounter of  01/13/22 (from the past 48 hour(s))  Basic metabolic panel     Status: Abnormal   Collection Time: 01/13/22  4:30 PM  Result Value Ref Range   Sodium 126 (L) 135 - 145 mmol/L   Potassium 4.1 3.5 - 5.1 mmol/L   Chloride 87 (L) 98 - 111 mmol/L   CO2 29 22 - 32 mmol/L   Glucose, Bld 278 (H) 70 - 99 mg/dL    Comment: Glucose reference range applies only to samples taken after fasting for at least 8 hours.   BUN 72 (H) 6 - 20 mg/dL   Creatinine, Ser 1.94 (H) 0.61 - 1.24 mg/dL   Calcium 8.8 (L) 8.9 - 10.3 mg/dL   GFR, Estimated 40 (L) >60 mL/min    Comment: (NOTE) Calculated using the CKD-EPI Creatinine Equation (2021)     Anion gap 10 5 - 15    Comment: Performed at [facility] [address] [place], [place] [address]  CBC     Status: Abnormal   Collection Time: 01/13/22  4:30 PM  Result Value Ref Range   WBC 9.2 4.0 - 10.5 K/uL   RBC 2.82 (L) 4.22 - 5.81 MIL/uL   Hemoglobin 8.3 (L) 13.0 - 17.0 g/dL   HCT 24.7 (L) 39.0 - 52.0 %   MCV 87.6 80.0 - 100.0 fL   MCH 29.4 26.0 - 34.0 pg   MCHC 33.6 30.0 - 36.0 g/dL   RDW 16.4 (H) 11.5 - 15.5 %   Platelets 370 150 - 400 K/uL   nRBC 0.0 0.0 - 0.2 %    Comment: Performed at [facility] [address], [place], [place] [address]  Brain natriuretic peptide     Status: Abnormal   Collection Time: 01/13/22  4:30 PM  Result Value Ref Range   B Natriuretic Peptide 663.4 (H) 0.0 - 100.0 pg/mL    Comment: Performed at [facility] [address] [place], [place] [address]  Hepatic function panel     Status: Abnormal   Collection Time: 01/13/22  4:30 PM  Result Value Ref Range   Total Protein 6.3 (L) 6.5 - 8.1 g/dL   Albumin 2.4 (L) 3.5 - 5.0 g/dL   AST 16 15 - 41 U/L   ALT 11 0 - 44 U/L   Alkaline Phosphatase 125 38 - 126 U/L   Total Bilirubin 0.7 0.3 - 1.2 mg/dL   Bilirubin, Direct 0.2 0.0 - 0.2 mg/dL   Indirect Bilirubin 0.5 0.3 - 0.9 mg/dL    Comment: Performed at [facility] [address], [place], [place] [address]  CBG monitoring, ED     Status: Abnormal   Collection Time: 01/13/22 10:10 PM  Result Value Ref Range   Glucose-Capillary 261 (H) 70 - 99 mg/dL    Comment: Glucose reference range applies only to samples taken after fasting for at least 8 hours.  Osmolality     Status: Abnormal   Collection Time: 01/14/22  3:59 AM  Result Value Ref Range   Osmolality 300 (H) 275 - 295 mOsm/kg    Comment: Performed at [facility], [place] [address] [place], [place] [address]  Basic metabolic panel     Status: Abnormal   Collection Time: 01/14/22  3:59 AM  Result Value Ref Range   Sodium 126 (L) 135 - 145 mmol/L    Potassium 4.2 3.5 - 5.1 mmol/L   Chloride 89 (L) 98 - 111 mmol/L  CO2 28 22 - 32 mmol/L   Glucose, Bld 299 (H) 70 - 99 mg/dL    Comment: Glucose reference range applies only to samples taken after fasting for at least 8 hours.   BUN 74 (H) 6 - 20 mg/dL   Creatinine, Ser 1.93 (H) 0.61 - 1.24 mg/dL   Calcium 8.6 (L) 8.9 - 10.3 mg/dL   GFR, Estimated 40 (L) >60 mL/min    Comment: (NOTE) Calculated using the CKD-EPI Creatinine Equation (2021)    Anion gap 9 5 - 15    Comment: Performed at [facility] [address], [place], [place] [address]  CBG monitoring, ED     Status: Abnormal   Collection Time: 01/14/22  7:19 AM  Result Value Ref Range   Glucose-Capillary 298 (H) 70 - 99 mg/dL    Comment: Glucose reference range applies only to samples taken after fasting for at least 8 hours.  Sodium, urine, random     Status: None   Collection Time: 01/14/22 10:26 AM  Result Value Ref Range   Sodium, Ur 21 mmol/L    Comment: Performed at [facility] [address], [place], [place] [address]  Creatinine, urine, random     Status: None   Collection Time: 01/14/22 10:26 AM  Result Value Ref Range   Creatinine, Urine 71.42 mg/dL    Comment: Performed at [facility] [address], [place], [place] [address]  Urinalysis, Complete w Microscopic     Status: Abnormal   Collection Time: 01/14/22 10:26 AM  Result Value Ref Range   Color, Urine YELLOW YELLOW   APPearance HAZY (A) CLEAR   Specific Gravity, Urine 1.011 1.005 - 1.030   pH 5.0 5.0 - 8.0   Glucose, UA 50 (A) NEGATIVE mg/dL   Hgb urine dipstick NEGATIVE NEGATIVE   Bilirubin Urine NEGATIVE NEGATIVE   Ketones, ur NEGATIVE NEGATIVE mg/dL   Protein, ur >=300 (A) NEGATIVE mg/dL   Nitrite NEGATIVE NEGATIVE   Leukocytes,Ua MODERATE (A) NEGATIVE   RBC / HPF 0-5 0 - 5 RBC/hpf   WBC, UA 11-20 0 - 5 WBC/hpf   Bacteria, UA RARE (A) NONE SEEN   Squamous Epithelial / LPF 0-5 0 - 5   WBC Clumps PRESENT    Sperm, UA  PRESENT     Comment: Performed at [facility], [place] [address], [place], [place] [address]  CBG monitoring, ED     Status: Abnormal   Collection Time: 01/14/22 11:41 AM  Result Value Ref Range   Glucose-Capillary 153 (H) 70 - 99 mg/dL    Comment: Glucose reference range applies only to samples taken after fasting for at least 8 hours.  CBG monitoring, ED     Status: Abnormal   Collection Time: 01/14/22 12:06 PM  Result Value Ref Range   Glucose-Capillary 138 (H) 70 - 99 mg/dL    Comment: Glucose reference range applies only to samples taken after fasting for at least 8 hours.   DG Chest 2 View  Result Date: 01/13/2022 CLINICAL DATA:  Edema in the lower extremity EXAM: CHEST - 2 VIEW COMPARISON:  12/20/2021 FINDINGS: Transverse diameter of heart is slightly increased. There are no signs of pulmonary edema or focal pulmonary consolidation. Patient's chin is partly obscuring the apices. Left lateral CP angle is not included. As far as seen, there is no significant pleural effusion or pneumothorax. Right IJ chest port is seen with its tip at the junction of superior  vena cava and right atrium. Old fracture is seen in the right clavicle. IMPRESSION: No active cardiopulmonary disease. Electronically Signed   By: Elmer Picker M.D.   On: 01/13/2022 17:27    Pending Labs Unresulted Labs (From admission, onward)     Start     Ordered   01/15/22 2094  Basic metabolic panel  Tomorrow morning,   R        01/14/22 1003   01/15/22 0500  CBC  Tomorrow morning,   R        01/14/22 1003            Vitals/Pain Today's Vitals   01/14/22 0619 01/14/22 0624 01/14/22 0750 01/14/22 0900  BP: (!) 162/73   (!) 164/69  Pulse: 65   63  Resp: 12   14  Temp:      TempSrc:      SpO2: 98%   100%  PainSc:  Asleep 9      Isolation Precautions No active isolations  Medications Medications  sodium chloride flush (NS) 0.9 % injection 10-40 mL (has no administration in time range)   Chlorhexidine Gluconate Cloth 2 % PADS 6 each (6 each Topical Not Given 01/14/22 0951)  enoxaparin (LOVENOX) injection 30 mg (30 mg Subcutaneous Given 01/13/22 2113)  atorvastatin (LIPITOR) tablet 40 mg (40 mg Per Tube Given 01/13/22 2156)  carvedilol (COREG) tablet 25 mg (25 mg Per Tube Given 01/14/22 1034)  cloNIDine (CATAPRES) tablet 0.1 mg (0.1 mg Per Tube Given 01/14/22 1033)  lactulose (CHRONULAC) 10 GM/15ML solution 10 g (has no administration in time range)  lidocaine (XYLOCAINE) 2 % viscous mouth solution 15 mL (has no administration in time range)  multivitamin liquid 15 mL (15 mLs Per Tube Given 01/14/22 1034)  mycophenolate (CELLCEPT) oral suspension 200 mg/mL (has no administration in time range)  feeding supplement (NEPRO CARB STEADY) liquid 237 mL (237 mLs Per Tube Given 01/14/22 1307)  ondansetron (ZOFRAN) tablet 4 mg (has no administration in time range)  polyethylene glycol (MIRALAX / GLYCOLAX) packet 17 g (has no administration in time range)  predniSONE (DELTASONE) tablet 5 mg (5 mg Per Tube Given 01/14/22 1033)  tamsulosin (FLOMAX) capsule 0.4 mg (0.4 mg Oral Given 01/14/22 1033)  vitamin B-12 (CYANOCOBALAMIN) tablet 500 mcg (500 mcg Per Tube Given 01/14/22 1033)  tacrolimus (PROGRAF) 1 mg (has no administration in time range)  amLODipine (NORVASC) tablet 10 mg (10 mg Per Tube Given 01/14/22 1033)  oxyCODONE (Oxy IR/ROXICODONE) immediate release tablet 20 mg (20 mg Per Tube Given 01/14/22 1229)  insulin aspart (novoLOG) injection 0-9 Units (1 Units Subcutaneous Given 01/14/22 1222)  insulin glargine-yfgn (SEMGLEE) injection 22 Units (has no administration in time range)  oxyCODONE (Oxy IR/ROXICODONE) immediate release tablet 5 mg (5 mg Per Tube Given 01/13/22 2257)  furosemide (LASIX) injection 40 mg (40 mg Intravenous Given 01/14/22 0520)    Mobility manual wheelchair High fall risk   Focused Assessments Neuro Assessment Handoff:  Swallow screen pass?  NPO-- Peg tube         Neuro  Assessment:   Neuro Checks:      Last Documented NIHSS Modified Score:   Has TPA been given? No If patient is a Neuro Trauma and patient is going to OR before floor call report to [facility] nurse: [phone] or [phone]  , Renal Assessment Handoff:  Hemodialysis Schedule: no hemo anymore Last Hemodialysis date and time:    Restricted appendage: left arm   R Recommendations: See Admitting Provider  Note  Report given to:   Additional Notes:

## 2022-01-14 NOTE — Progress Notes (Signed)
HD#0 Subjective:  Overnight Events: none  Patient assessed at bedside this AM. He states that he continues to have swelling in throat and lower extremities. He has some pain in his right shoulder. He has not urinated yet. He reports have sacral wound that is now improved. No other complaints at this time.  Pt is updated on the plan for today, and all questions and concerns are addressed.   Objective:  Vital signs in last 24 hours: Vitals:   01/14/22 0200 01/14/22 0245 01/14/22 0500 01/14/22 0619  BP: (!) 164/69  (!) 162/69 (!) 162/73  Pulse: 62 63 64 65  Resp: '14 12 13 12  '$ Temp:      TempSrc:      SpO2: 96% 99% 97% 98%   Supplemental O2: Room Air SpO2: 98 %   Physical Exam:  Constitutional: Chronically ill appearing, sitting in bed HENT: neck mass with superficial edema and radiation skin changes Eyes: conjunctiva non-erythematous Cardiovascular: regular rate and rhythm, no m/r/g Pulmonary/Chest: normal work of breathing on room air, lungs clear to auscultation bilaterally Abdominal: soft, non-tender, non-distended, G-tube in place MSK: decreased bulk, stage 1 pressure ulcer of sacrum Neurological: alert and orients x 3, answers questions appropriately Skin: warm and dry Psych: normal mood and affect  There were no vitals filed for this visit.   Intake/Output Summary (Last 24 hours) at 01/14/2022 0917 Last data filed at 01/14/2022 0055 Gross per 24 hour  Intake 357 ml  Output --  Net 357 ml   Net IO Since Admission: 357 mL [01/14/22 0917]  Pertinent Labs:    Latest Ref Rng & Units 01/13/2022    4:30 PM 12/25/2021    1:18 PM 12/25/2021    4:10 AM  CBC  WBC 4.0 - 10.5 K/uL 9.2   9.9   7.3    Hemoglobin 13.0 - 17.0 g/dL 8.3   8.5   7.7    Hematocrit 39.0 - 52.0 % 24.7   25.9   23.3    Platelets 150 - 400 K/uL 370   300   272         Latest Ref Rng & Units 01/14/2022    3:59 AM 01/13/2022    4:30 PM 12/25/2021    4:10 AM  CMP  Glucose 70 - 99 mg/dL 299   278    157    BUN 6 - 20 mg/dL 74   72   80    Creatinine 0.61 - 1.24 mg/dL 1.93   1.94   1.36    Sodium 135 - 145 mmol/L 126   126   138    Potassium 3.5 - 5.1 mmol/L 4.2   4.1   4.0    Chloride 98 - 111 mmol/L 89   87   100    CO2 22 - 32 mmol/L 28   29   33    Calcium 8.9 - 10.3 mg/dL 8.6   8.8   8.2    Total Protein 6.5 - 8.1 g/dL  6.3     Total Bilirubin 0.3 - 1.2 mg/dL  0.7     Alkaline Phos 38 - 126 U/L  125     AST 15 - 41 U/L  16     ALT 0 - 44 U/L  11       Imaging: DG Chest 2 View  Result Date: 01/13/2022 CLINICAL DATA:  Edema in the lower extremity EXAM: CHEST - 2 VIEW COMPARISON:  12/20/2021 FINDINGS:  Transverse diameter of heart is slightly increased. There are no signs of pulmonary edema or focal pulmonary consolidation. Patient's chin is partly obscuring the apices. Left lateral CP angle is not included. As far as seen, there is no significant pleural effusion or pneumothorax. Right IJ chest port is seen with its tip at the junction of superior vena cava and right atrium. Old fracture is seen in the right clavicle. IMPRESSION: No active cardiopulmonary disease. Electronically Signed   By: Elmer Picker M.D.   On: 01/13/2022 17:27    Assessment/Plan:   Principal Problem:   Hyponatremia   Patient Summary: Arthur Burke is a 56 y.o. 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   Acute on chronic lower extremity edema Malnutrition G-tube dependent Patient with significant edema in lower extremities bilaterally on exam.  It is likely that he is third spacing due to malnutrition.  Acute worsening of swelling  likely due to not meeting needs for tube feeds leading to hyponatremia and increased third spacing.  He was given 1 dose of furosemide, but has not had urine output. -Bladder scan -Dietitian consult -Follow-up urine studies if able to be collected -Speech evaluation n.p.o. until then  Moderate hyponatremia Corrected sodium at 129.  Patient is reporting 3 tube feeds daily with 5 free water flushes.  We will have registered dietitian reevaluation better combination with prevent recurrent hyponatremia.  -Daily BMP -Sodium goal no greater than 134 for the next 24 hours  Nonoliguric AKI versus CKD progression ESRD status postrenal transplant Baseline creatinine thought to be around 1.7-2.  Creatinine 3 this morning.  He was given 1 dose of IV furosemide this morning but continues to not have urine output. -Bladder scan, if post void greater than 400 then in and out cath with q6 bladder scan after that -Avoid nephrotoxic medications -Daily BMP -Continue prednisone, tacrolimus, and mycophenolate -Continue Flomax  Squamous cell carcinoma s/p radiation treatment Neck swelling Patient reports difficulty swallowing.  Able to manage secretions and no signs of airway compromise last SLP evaluation was 5/15 with worsening will have speech reevaluate.  -N.p.o. until evaluation  Deconditioning Evaluation patient has nonblanching erythema of sacrum consistent with stage 1 pressure ulcer. -PT/OT eval and treat -Reached out to nursing to coordinate getting air mattress while patient here.  Type 1 diabetes mellitus Patient takes 22 units of glargine nightly.  His blood sugars have been elevated in the upper 200s on evaluation this morning.  We will change sliding scale from very sensitive to sensitive -Glargine 22 units daily -Sensitive sliding scale  Iron deficiency anemia Chronic and stable, no signs of active bleeding.  Iron labs completed 3/23 showed iron 43, TIBC 151.  Hypertension Continue  home coreg 25, clonidine 0.1, and and amlodipine 10.  Chronic pain Continued home oxycodone 20 Q4 PRN  GERD Continue home omeprazole.  HLD Continue home atorvastatin.  Diet: NPO VTE: Enoxaparin Code: Full PT/OT recs: Pending Family Update: patient stated he would prefer to update brother    Dispo: Anticipated discharge to Home in 1-2 days pending improvement in edema.   Renai Lopata M. Josefina Rynders, D.O.  Internal Medicine Resident, PGY-1 [facility]  Pager: [phone] 9:17 AM, 01/14/2022   **Please contact the on call pager after 5 pm and on weekends at [phone].**

## 2022-01-14 NOTE — Progress Notes (Signed)
Inpatient Diabetes Program Recommendations  AACE/ADA: New Consensus Statement on Inpatient Glycemic Control (2015)  Target Ranges:  Prepandial:   less than 140 mg/dL      Peak postprandial:   less than 180 mg/dL (1-2 hours)      Critically ill patients:  140 - 180 mg/dL   Lab Results  Component Value Date   GLUCAP 298 (H) 01/14/2022   HGBA1C 8.2 (H) 10/21/2021    Review of Glycemic Control  Latest Reference Range & Units 01/13/22 22:10 01/14/22 07:19  Glucose-Capillary 70 - 99 mg/dL 261 (H) 298 (H)   Diabetes history: DM type 1 Outpatient Diabetes medications: Lantus 22 units Daily, Humalog 0-9 units tid  Current orders for Inpatient glycemic control:  Semglee 20 units Novolog 0-6 units tid  Inpatient Diabetes Program Recommendations:    -  Increase Semglee to 22 units -  Increase Novolog Correction scale to 0-9 units tid (home dose)  Thanks,  Tama Headings RN, MSN, BC-ADM Inpatient Diabetes Coordinator Team Pager [phone] (8a-5p)

## 2022-01-14 NOTE — Evaluation (Signed)
Physical Therapy Evaluation Patient Details Name: Arthur Burke MRN: [id number] DOB: 07/04/1966 Today's Date: 01/14/2022  History of Present Illness  Pt is a 56 y/o male presenting on 6/4 with LE edema and throat swelling. Admitted with hyponatremia, AKI. 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  Clinical Impression  Pt admitted with above diagnosis. Pt was able to pivot 3N1 to stretcher with mod to max assist as pt  showing PT/OT how he likes to do transfers at home.  Pt's technique was unsafe and pt needed a lot of assist to pull up pants and for safety.  PT/OT then showed pt a safer way to transfer to wheelchair by moving the armrest and pt appreciated it and was safe in the technique.  Pt having difficulty at home PTA due to weakness after radiation as pt used to be able to place prosthetic on and use RW for ambulation and transfers and currently has been wheelchair level for all mobility.  PT feels that this pt will benefit from SNF for Rehab.  Pt currently with functional limitations due to the deficits listed below (see PT Problem List). Pt will benefit from skilled PT to increase their independence and safety with mobility to allow discharge to the venue listed below.          Recommendations for follow up therapy are one component of a multi-disciplinary discharge planning process, led by the attending physician.  Recommendations may be updated based on patient status, additional functional criteria and insurance authorization.  Follow Up Recommendations Skilled nursing-short term rehab (<3 hours/day)    Assistance Recommended at Discharge Intermittent Supervision/Assistance  Patient can return home with the following  A little help with walking and/or transfers;A little help with bathing/dressing/bathroom;Assistance with cooking/housework;Direct  supervision/assist for medications management;Help with stairs or ramp for entrance;Assist for transportation    Equipment Recommendations None recommended by PT  Recommendations for Other Services       Functional Status Assessment Patient has had a recent decline in their functional status and demonstrates the ability to make significant improvements in function in a reasonable and predictable amount of time.     Precautions / Restrictions Precautions Precautions: Fall Precaution Comments: Lt BKA, peg Restrictions Weight Bearing Restrictions: No      Mobility  Bed Mobility               General bed mobility comments: OOB on BSC upon entry    Transfers Overall transfer level: Needs assistance   Transfers: Bed to chair/wheelchair/BSC       Squat pivot transfers: +2 physical assistance, Max assist, Mod assist    Lateral/Scoot Transfers: Min guard, Min assist General transfer comment: Pt on 3N1 facing stretcher on arrival.  Pt cleaned self of urine. Pt stood from 3n1, needed max assist to pull up his shorts and underwear  and pivoted to the stretcher to his left with pt putting his residual limb in flexion on stretcher first for balance and thenmoving hips to left to slide into bed.  Cues and min assist for safety.  Discussed and practiced with pt a safer option for transfers.  Placed the wheelchair directly beside the bed to pts right, moved the left armrest and pt able to perform lateral scoot to chair with min guard assist. Educated pt in how to lift and close the armrest as he was unaware.  Pt had some difficulty with this due to his poor viison and poor dexterity with hands but he was able to complete with practice.    Ambulation/Gait               General Gait Details: Pt has been wheelchair bound for a bit of time due to weakness per pt.  Stairs            Wheelchair Mobility    Modified Rankin (Stroke Patients Only)       Balance Overall  balance assessment: Needs assistance Sitting-balance support: No upper extremity supported, Feet supported Sitting balance-Leahy Scale: Good     Standing balance support: Bilateral upper extremity supported, During functional activity Standing balance-Leahy Scale: Poor Standing balance comment: relies on BUE support                             Pertinent Vitals/Pain Pain Assessment Pain Assessment: Faces Faces Pain Scale: Hurts whole lot Pain Location: throat, neck and back Pain Descriptors / Indicators: Discomfort Pain Intervention(s): Limited activity within patient's tolerance, Monitored during session, Repositioned    Home Living Family/patient expects to be discharged to:: Private residence Living Arrangements: Alone Available Help at Discharge: Family;Friend(s);Available PRN/intermittently Type of Home: Apartment Home Access: Level entry       Home Layout: One level Home Equipment: Conservation officer, nature (2 wheels);Wheelchair Probation officer (4 wheels);Tub bench;BSC/3in1;Grab bars - tub/shower      Prior Function Prior Level of Function : Independent/Modified Independent             Mobility Comments: use of wheelchair all of las\t week because of swelling in prosthetic limb; Modif I fromj wheelchair ADLs Comments: Mod indep with ADL/iADLs; does not drive, sink baths     Hand Dominance   Dominant Hand: Right    Extremity/Trunk Assessment   Upper Extremity Assessment Upper Extremity Assessment: Defer to OT evaluation    Lower Extremity Assessment Lower Extremity Assessment: Generalized weakness RLE Deficits / Details: grossly 3-/5 LLE Deficits / Details: residual limb intact, no excessive edema noted    Cervical / Trunk Assessment Cervical / Trunk Assessment: Normal  Communication   Communication: No difficulties  Cognition Arousal/Alertness: Awake/alert Behavior During Therapy: WFL for tasks assessed/performed Overall Cognitive Status: No  family/caregiver present to determine baseline cognitive functioning Area of Impairment: Safety/judgement, Awareness, Problem solving                         Safety/Judgement: Decreased awareness of safety Awareness: Emergent Problem Solving: Slow processing, Requires verbal cues General Comments: pt with decreased safety awareness and problem sovling        General Comments General comments (skin integrity, edema, etc.): VSS    Exercises General Exercises - Lower Extremity Long Arc Quad: AROM, Both, 10 reps Hip Flexion/Marching: AROM, Both, 10 reps   Assessment/Plan    PT Assessment Patient needs continued PT services  PT Problem List Decreased strength;Decreased activity tolerance;Decreased balance;Decreased mobility;Decreased knowledge of precautions;Pain       PT Treatment Interventions DME instruction;Gait training;Stair training;Functional mobility training;Therapeutic activities;Therapeutic exercise;Balance training;Neuromuscular re-education;Patient/family education;Wheelchair mobility training    PT Goals (Current goals can be found in the Care Plan section)  Acute Rehab PT Goals Patient Stated Goal: regain strength and independence PT Goal Formulation: With patient Time For Goal Achievement: 01/28/22 Potential to Achieve Goals: Good    Frequency Min 3X/week     Co-evaluation PT/OT/SLP Co-Evaluation/Treatment:  Yes Reason for Co-Treatment: Complexity of the patient's impairments (multi-system involvement);For patient/therapist safety PT goals addressed during session: Mobility/safety with mobility         AM-PAC PT "6 Clicks" Mobility  Outcome Measure Help needed turning from your back to your side while in a flat bed without using bedrails?: None Help needed moving from lying on your back to sitting on the side of a flat bed without using bedrails?: None Help needed moving to and from a bed to a chair (including a wheelchair)?: A Little Help  needed standing up from a chair using your arms (e.g., wheelchair or bedside chair)?: A Lot Help needed to walk in hospital room?: Total Help needed climbing 3-5 steps with a railing? : Total 6 Click Score: 15    End of Session Equipment Utilized During Treatment: Gait belt Activity Tolerance: Patient tolerated treatment well Patient left: in chair;with call bell/phone within reach;with chair alarm set Nurse Communication: Mobility status PT Visit Diagnosis: Unsteadiness on feet (R26.81);Muscle weakness (generalized) (M62.81);Difficulty in walking, not elsewhere classified (R26.2)    Time: 1007-1030 PT Time Calculation (min) (ACUTE ONLY): 23 min   Charges:   PT Evaluation $PT Eval Moderate Complexity: 1 Mod          Coco Sharpnack M,PT Acute Rehab Services [phone] (pager)   Alvira Philips 01/14/2022, 2:27 PM

## 2022-01-14 NOTE — Progress Notes (Signed)
SLP Cancellation Note  Patient Details Name: Arthur Burke MRN: [id number] DOB: 09-Mar-1966   Cancelled treatment:       Reason Eval/Treat Not Completed: Patient declined. Pt known to ST services, having had MBS and OP ST in the recent past. Pt reports he has limited PO intake to ice chips, as anything else burns his throat. Pt's voice was noted to be intermittently wet, and he was coughing up "froth and phlegm" on a regular basis. Pt declined PO trials at this time, as he indicated "they are trying to get fluid off me, so I don't want to add anything to it". SLP told pt we would check again tomorrow and proceed accordingly.  Of note, pt was spitting secretions into a cup, which spilled over at one point. SLP cleaned the table with bleach wipes and set suction up for pt to manage his secretions more discretely and in a more sanitary fashion. Will continue efforts.   Shawnice Tilmon B. Quentin Ore, [facility], [place] Speech Language Pathologist Office: [phone]  Shonna Chock 01/14/2022, 10:48 AM

## 2022-01-15 LAB — GLUCOSE, CAPILLARY
Glucose-Capillary: 105 mg/dL — ABNORMAL HIGH (ref 70–99)
Glucose-Capillary: 143 mg/dL — ABNORMAL HIGH (ref 70–99)
Glucose-Capillary: 216 mg/dL — ABNORMAL HIGH (ref 70–99)
Glucose-Capillary: 219 mg/dL — ABNORMAL HIGH (ref 70–99)
Glucose-Capillary: 47 mg/dL — ABNORMAL LOW (ref 70–99)
Glucose-Capillary: 77 mg/dL (ref 70–99)

## 2022-01-15 LAB — CBC
HCT: 23.8 % — ABNORMAL LOW (ref 39.0–52.0)
Hemoglobin: 8 g/dL — ABNORMAL LOW (ref 13.0–17.0)
MCH: 29 pg (ref 26.0–34.0)
MCHC: 33.6 g/dL (ref 30.0–36.0)
MCV: 86.2 fL (ref 80.0–100.0)
Platelets: 372 10*3/uL (ref 150–400)
RBC: 2.76 MIL/uL — ABNORMAL LOW (ref 4.22–5.81)
RDW: 16.6 % — ABNORMAL HIGH (ref 11.5–15.5)
WBC: 10.1 10*3/uL (ref 4.0–10.5)
nRBC: 0 % (ref 0.0–0.2)

## 2022-01-15 LAB — BASIC METABOLIC PANEL
Anion gap: 5 (ref 5–15)
BUN: 72 mg/dL — ABNORMAL HIGH (ref 6–20)
CO2: 30 mmol/L (ref 22–32)
Calcium: 8.6 mg/dL — ABNORMAL LOW (ref 8.9–10.3)
Chloride: 93 mmol/L — ABNORMAL LOW (ref 98–111)
Creatinine, Ser: 1.85 mg/dL — ABNORMAL HIGH (ref 0.61–1.24)
GFR, Estimated: 42 mL/min — ABNORMAL LOW (ref >60)
Glucose, Bld: 140 mg/dL — ABNORMAL HIGH (ref 70–99)
Potassium: 4.2 mmol/L (ref 3.5–5.1)
Sodium: 128 mmol/L — ABNORMAL LOW (ref 135–145)

## 2022-01-15 MED ORDER — FERROUS SULFATE 75 (15 FE) MG/ML PO SOLN: 15.0000 mg | Freq: Every day | ORAL | Status: DC

## 2022-01-15 MED ORDER — OSMOLITE 1.2 CAL PO LIQD: 1000.0000 mL | ORAL | Status: DC

## 2022-01-15 MED ORDER — FERROUS SULFATE 300 (60 FE) MG/5ML PO SYRP
300.0000 mg | ORAL_SOLUTION | Freq: Every day | ORAL | Status: DC
  Administered 2022-01-15 – 2022-01-21 (×7): 300 mg
  Filled 2022-01-15 (×8): qty 5

## 2022-01-15 MED ORDER — FERROUS SULFATE 325 (65 FE) MG PO TABS: 325.0000 mg | ORAL_TABLET | Freq: Every day | ORAL | Status: DC

## 2022-01-15 MED ORDER — PROSOURCE TF PO LIQD
45.0000 mL | Freq: Three times a day (TID) | ORAL | Status: DC
  Administered 2022-01-15 – 2022-01-21 (×18): 45 mL
  Filled 2022-01-15 (×18): qty 45

## 2022-01-15 MED ORDER — INSULIN GLARGINE-YFGN 100 UNIT/ML ~~LOC~~ SOLN
18.0000 [IU] | Freq: Every day | SUBCUTANEOUS | Status: DC
  Administered 2022-01-15 – 2022-01-17 (×3): 18 [IU] via SUBCUTANEOUS
  Filled 2022-01-15 (×3): qty 0.18

## 2022-01-15 MED ORDER — OSMOLITE 1.5 CAL PO LIQD
356.0000 mL | Freq: Four times a day (QID) | ORAL | Status: DC
  Administered 2022-01-15 – 2022-01-16 (×3): 356 mL
  Administered 2022-01-16: 237 mL
  Administered 2022-01-16: 356 mL
  Administered 2022-01-16: 237 mL
  Administered 2022-01-17 – 2022-01-21 (×16): 356 mL
  Filled 2022-01-15 (×3): qty 474

## 2022-01-15 NOTE — Progress Notes (Signed)
Initial Nutrition Assessment  DOCUMENTATION CODES:   Underweight  INTERVENTION:  Provide bolus feeds using Osmolite 1.5 cal formula via PEG at starting volume of 120 ml (half carton) and increase by 120 ml at each feeding until goal volume feeds of 356 ml (1.5 cartons/ARCs) given QID is met.   Provide 45 ml Prosource TF TID per tube.   Tube feeding to provide 2256 kcal, 122 grams of protein, and 1082 ml free water.   Full liquid diet po for comfort per SLP.   NUTRITION DIAGNOSIS:   Inadequate oral intake related to inability to eat, dysphagia as evidenced by  (PEG tube dependent).  GOAL:   Patient will meet greater than or equal to 90% of their needs  MONITOR:   PO intake, Labs, Weight trends, Skin, I & O's, TF tolerance  REASON FOR ASSESSMENT:   Consult Enteral/tube feeding initiation and management  ASSESSMENT:   56 y.o. 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  Pt is currently receiving Nepro 1.8 cal formula bolus feeds at 237 ml given 5 times daily via PEG which provides 2125 kcal and 95 grams of protein. Pt reports he was instructed by MD to only provide water to flush tube or give with meds due to his fluid restriction. Pt reports Nepro formula has been tolerated fair, however complains of feeling of fullness and be being bloated. Pt aware for need to change tube feeding formula to formulation with high sodium content. RD to switch formula to Osmolite 1.5 cal. Pt reports he has been on Osmolite formula before and it has caused gassiness, however is willing to try it again. Plans for slow advancement to goal  volume feeds to ensure tolerance. If pt unable to tolerate bolus feeds or volume amount, may need to consider continuous/intermittent feeds or nocturnal feeds. RD to additionally order protein modulars via tube to aid in increased protein needs. Per SLP eval, recommends po for comfort, however full nutrition to be given via tube feeding.   NUTRITION - FOCUSED PHYSICAL EXAM:  Flowsheet Row Most Recent Value  Orbital Region No depletion  Upper Arm Region No depletion  Thoracic and Lumbar Region No depletion  Buccal Region No depletion  Temple Region No depletion  Clavicle Bone Region Moderate depletion  Clavicle and Acromion Bone Region Moderate depletion  Scapular Bone Region Unable to assess  Dorsal Hand Unable to assess  Patellar Region Moderate depletion  Anterior Thigh Region Moderate depletion  Posterior Calf Region Moderate depletion  Edema (RD Assessment) Moderate  Hair Unable to assess  Eyes Reviewed  Mouth Reviewed  Skin Reviewed  Nails Reviewed      Labs and medications reviewed.   Diet Order:   Diet Order             Diet full liquid Room service appropriate? Yes; Fluid consistency: Thin  Diet effective now                   EDUCATION NEEDS:   Education needs have been addressed  Skin:  Skin Assessment: Skin Integrity Issues: Skin Integrity Issues:: Stage I Stage I: coccyx  Last BM:  5/30  Height:   Ht  Readings from Last 1 Encounters:  01/15/22 '6\' 5"'  (1.956 m)    Weight:   Wt Readings from Last 1 Encounters:  01/15/22 69.2 kg    Ideal Body Weight:  95.5 kg  BMI:  Body mass index is 18.09 kg/m.  Estimated Nutritional Needs:   Kcal:  2200-2400  Protein:  110-130 grams  Fluid:  UOP + 1 L  Corrin Parker, MS, RD, LDN RD pager number/after hours weekend pager number on Amion.

## 2022-01-15 NOTE — Evaluation (Signed)
Clinical/Bedside Swallow Evaluation Patient Details  Name: Arthur Burke MRN: [id number] Date of Birth: 1965/11/06  Today's Date: 01/15/2022 Time: SLP Start Time (ACUTE ONLY): 1107 SLP Stop Time (ACUTE ONLY): 1116 SLP Time Calculation (min) (ACUTE ONLY): 9 min  Past Medical History:  Past Medical History:  Diagnosis Date   Acute osteomyelitis of left calcaneus (HCC)    Cancer ([place])    Diabetes mellitus without complication ([place])    Diabetic foot infection ([place])    Hypertension    Infected ulcer of skin, with fat layer exposed ([place])    Lower limb ulcer, ankle, left, with necrosis of muscle (HCC)    MRSA (methicillin resistant staph aureus) culture positive    Renal disorder    Vision impairment    Past Surgical History:  Past Surgical History:  Procedure Laterality Date   AMPUTATION Left 08/23/2017   Procedure: AMPUTATION BELOW KNEE;  Surgeon: Newt Minion, MD;  Location: [facility];  Service: Orthopedics;  Laterality: Left;   APPLICATION OF WOUND VAC Left 08/23/2017   Procedure: APPLICATION OF WOUND VAC;  Surgeon: Newt Minion, MD;  Location: [facility];  Service: Orthopedics;  Laterality: Left;   IR GASTROSTOMY TUBE MOD SED  10/22/2021   IR IMAGING GUIDED PORT INSERTION  10/22/2021   KIDNEY TRANSPLANT  2006   [facility]   SKIN GRAFT     L diabetic foot wound with debriedment and skin grafting   TOE AMPUTATION     R great and 2nd toe   HPI:  56 yo male admitted 6/4 with LE edema and throat swelling. Admitted with hyponatremia, AKI. 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 MBS 10/26/21 - reg/thin, 12/24/21 - thin liquids    Assessment / Plan / Recommendation  Clinical Impression  Pt presents with known hx of dysphagia 2/2 throat cancer treatment.  Pt completed MBS 12/24/21 revealing "a moderately impaired pharyngeal phase dysphagia but with oral phase  appearing WFL" which marked a decline from baseline MBS prior to treatment.  Pt states that he is reliant on PEG for nutrition, but does take some ice and water.  Pt tolerated ice chips and thin liquid by cup and spoon.  Laryngeal elevation difficult to fully palpate 2/2 swelling.  Discussed risk of aspiration of thin liquids given results of prior mod and importance of oral care prior to oral intake.  Pt declined trials of puree.  He states that he would like to be able to eat some ice cream.  Provided education that ice cream is a more risky food than water only.  As ice cream is a thin liquid it would be consistent with recomendations from 5/15 MBS.   Recommend full liquid diet to allow for ice cream for comfort, but with pt to rely on PEG for nutrition.   Administer medications via alternate means.    SLP to follow for tolerance.  Pt states that he is comfortable with swallow exercises from outpatient therapy and does not need additional instruction.   SLP Visit Diagnosis: Dysphagia, pharyngeal phase (R13.13)    Aspiration Risk  Mild aspiration risk;Moderate aspiration risk;Risk for inadequate nutrition/hydration    Diet Recommendation Thin liquid   Liquid Administration via: Cup Medication Administration: Via alternative means Supervision: Patient able to self feed Compensations: Slow rate;Small sips/bites;Clear throat after each swallow Postural Changes: Seated upright at 90 degrees  Other  Recommendations Oral Care Recommendations: Oral care QID;Oral care prior to ice chip/H20;Oral care before and after PO    Recommendations for follow up therapy are one component of a multi-disciplinary discharge planning process, led by the attending physician.  Recommendations may be updated based on patient status, additional functional criteria and insurance authorization.  Follow up Recommendations Outpatient SLP      Assistance Recommended at Discharge PRN  Functional Status Assessment  Patient has had a recent decline in their functional status and demonstrates the ability to make significant improvements in function in a reasonable and predictable amount of time.  Frequency and Duration min 2x/week  2 weeks       Prognosis Prognosis for Safe Diet Advancement: Good      Swallow Study   General Date of Onset: 01/14/22 HPI: 56 yo male admitted 6/4 with LE edema and throat swelling. Admitted with hyponatremia, AKI. 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 MBS 10/26/21 - reg/thin, 12/24/21 - thin liquids Type of Study: Bedside Swallow Evaluation Previous Swallow Assessment: MBS 5/15 Diet Prior to this Study: NPO Temperature Spikes Noted: No Respiratory Status: Room air History of Recent Intubation: No Behavior/Cognition: Alert;Pleasant mood;Cooperative Oral Cavity Assessment: Within Functional Limits Oral Cavity - Dentition: Adequate natural dentition Vision: Functional for self-feeding Self-Feeding Abilities: Able to feed self Patient Positioning: Upright in bed Baseline Vocal Quality: Hoarse Volitional Swallow: Able to elicit    Oral/Motor/Sensory Function Overall Oral Motor/Sensory Function: Within functional limits Facial ROM: Within Functional Limits Facial Symmetry: Within Functional Limits Lingual ROM: Within Functional Limits Lingual Symmetry: Within Functional Limits Lingual Strength: Within Functional Limits Velum: Within Functional Limits Mandible: Within Functional Limits   Ice Chips Ice chips: Within functional limits Presentation: Spoon   Thin Liquid Thin Liquid: Within functional limits Presentation: Straw;Spoon    Nectar Thick Nectar Thick Liquid: Not tested   Honey Thick Honey Thick Liquid: Not tested   Puree Puree: Not tested   Solid     Solid: Not tested      Arthur Savage, MA,  [place] Office: [phone] 01/15/2022,12:20 PM

## 2022-01-15 NOTE — NC FL2 (Signed)
[place] LEVEL OF CARE SCREENING TOOL     IDENTIFICATION  Patient Name: Arthur Burke Birthdate: [birth year] Sex: male Admission Date (Current Location): 01/13/2022  [facility] and [place] Number:  Herbalist and Address:  [facility], [place] [address] [place], [place] [address]      Provider Number: [id number]  Attending Physician Name and Address:  Sid Falcon, MD  Relative Name and Phone Number:  [name], [name]   [phone]    Current Level of Care: Hospital Recommended Level of Care: [place] Prior Approval Number:    Date Approved/Denied:   PASRR Number: [id number]  Discharge Plan: SNF    Current Diagnoses: Patient Active Problem List   Diagnosis Date Noted   Pressure injury of skin 12/22/2021   Hypoglycemia 12/20/2021   Cancer of tonsillar fossa ([place]) 11/09/2021   Nausea 11/07/2021   Malnutrition of moderate degree 10/26/2021   Type 1 diabetes mellitus with hyperglycemia ([place])    Dysphagia    Dehydration 10/21/2021   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   Fall    Essential hypertension    Unilateral complete BKA, left, sequela (HCC)    Leukocytosis    AKI (acute kidney injury) ([place])    Diabetic peripheral neuropathy associated with type 2 diabetes mellitus ([place])    Amputation of left lower extremity below knee ([place]) 08/26/2017   Anemia in chronic kidney disease    Unilateral complete BKA, left, subsequent encounter ([place])    Post-operative pain    Stage 3 chronic kidney disease ([place])    Hyponatremia    Benign essential HTN    Acute blood loss anemia    S/P BKA (below knee amputation) unilateral, left ([place]) 08/23/2017   Streptococcal bacteremia 08/23/2017   Normocytic anemia    History of kidney transplant    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 (HCC)    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 (HCC)     Orientation RESPIRATION BLADDER  Height & Weight     Self, Time, Situation, Place  Normal Continent, Indwelling catheter Weight: 152 lb 8.9 oz (69.2 kg) (bed wght.) Height:  '6\' 5"'$  (195.6 cm) (per pt.)  BEHAVIORAL SYMPTOMS/MOOD NEUROLOGICAL BOWEL NUTRITION STATUS      Continent Feeding tube  AMBULATORY STATUS COMMUNICATION OF NEEDS Skin   Total Care Verbally Normal, Other (Comment) (redness, scab on forehead)                       Personal Care Assistance Level of Assistance  Bathing, Feeding, Dressing Bathing Assistance: Limited assistance Feeding assistance: Limited assistance Dressing Assistance: Limited assistance     Functional Limitations Info  Sight, Hearing, Speech Sight Info: Impaired Hearing Info: Adequate Speech Info: Adequate    SPECIAL CARE FACTORS FREQUENCY  PT (By licensed PT), OT (By licensed OT)     PT Frequency: 5x week OT Frequency: 5x week            Contractures Contractures Info: Not present    Additional Factors Info  Code Status, Allergies, Insulin Sliding Scale Code Status Info: full Allergies Info: zolpidem   Insulin Sliding Scale Info: Novolog, 0-9 units TID with meals, see discharge summary       Current Medications (01/15/2022):  This is the current hospital active medication list Current Facility-Administered Medications  Medication Dose Route Frequency Provider Last Rate Last Admin   amLODipine (NORVASC) tablet 10 mg  10 mg  Per Tube Daily Riesa Pope, MD   10 mg at 01/15/22 0843   atorvastatin (LIPITOR) tablet 40 mg  40 mg Per Tube QPM Katsadouros, Vasilios, MD   40 mg at 01/14/22 1707   carvedilol (COREG) tablet 25 mg  25 mg Per Tube BID Johnney Ou, Vasilios, MD   25 mg at 01/15/22 0843   Chlorhexidine Gluconate Cloth 2 % PADS 6 each  6 each Topical Daily Deno Etienne, DO   6 each at 01/15/22 0943   cloNIDine (CATAPRES) tablet 0.1 mg  0.1 mg Per Tube BID Katsadouros, Vasilios, MD   0.1 mg at 01/15/22 0843   enoxaparin (LOVENOX) injection 30 mg  30 mg  Subcutaneous Q24H Katsadouros, Vasilios, MD   30 mg at 01/14/22 2217   feeding supplement (NEPRO CARB STEADY) liquid 237 mL  237 mL Per Tube 5 X Daily Katsadouros, Vasilios, MD   237 mL at 01/15/22 1315   ferrous sulfate 300 (60 Fe) MG/5ML syrup 300 mg  300 mg Per Tube Daily Masters, Katie, DO   300 mg at 01/15/22 1315   insulin aspart (novoLOG) injection 0-9 Units  0-9 Units Subcutaneous TID WC Masters, Katie, DO   2 Units at 01/14/22 1722   insulin glargine-yfgn (SEMGLEE) injection 18 Units  18 Units Subcutaneous QHS Masters, Katie, DO       lactulose (CHRONULAC) 10 GM/15ML solution 10 g  10 g Per Tube Daily PRN Katsadouros, Vasilios, MD       lidocaine (XYLOCAINE) 2 % viscous mouth solution 15 mL  15 mL Mouth/Throat Q6H PRN Katsadouros, Vasilios, MD       multivitamin liquid 15 mL  15 mL Per Tube Daily Katsadouros, Vasilios, MD   15 mL at 01/15/22 0943   mycophenolate (CELLCEPT) oral suspension 200 mg/mL  250 mg Oral BID Katsadouros, Vasilios, MD   250 mg at 01/15/22 0942   ondansetron (ZOFRAN) tablet 4 mg  4 mg Per Tube BID PRN Katsadouros, Vasilios, MD       oxyCODONE (Oxy IR/ROXICODONE) immediate release tablet 20 mg  20 mg Per Tube Q4H PRN Katsadouros, Vasilios, MD   20 mg at 01/15/22 1418   polyethylene glycol (MIRALAX / GLYCOLAX) packet 17 g  17 g Per Tube Daily PRN Katsadouros, Vasilios, MD   17 g at 01/15/22 1045   predniSONE (DELTASONE) tablet 5 mg  5 mg Per Tube Daily Katsadouros, Vasilios, MD   5 mg at 01/15/22 0843   sodium chloride flush (NS) 0.9 % injection 10-40 mL  10-40 mL Intracatheter PRN Deno Etienne, DO       tacrolimus (PROGRAF) 1 mg  1 mg Per Tube BID Katsadouros, Vasilios, MD   1 mg at 01/15/22 0942   tamsulosin (FLOMAX) capsule 0.4 mg  0.4 mg Oral Daily Katsadouros, Vasilios, MD   0.4 mg at 01/15/22 6811   vitamin B-12 (CYANOCOBALAMIN) tablet 500 mcg  500 mcg Per Tube Daily Katsadouros, Vasilios, MD   500 mcg at 01/15/22 5726     Discharge Medications: Please see  discharge summary for a list of discharge medications.  Relevant Imaging Results:  Relevant Lab Results:   Additional Information SS#[id number], pt reports one covid vaccine shot  Tiari Andringa, Veronia Beets, LCSW

## 2022-01-15 NOTE — Progress Notes (Addendum)
HD#1 Subjective:  Overnight Events: Hypoglycemic to 47.  He was given juice through tube and this improved to 70s.  Assessed at bedside this morning.  He reports cramping that frequently happens on average sodium as well.  He states that swelling seems a little bit better but swelling of the throat is about the same as yesterday.  Pt is updated on the plan for today, and all questions and concerns are addressed.   Objective:  Vital signs in last 24 hours: Vitals:   01/14/22 0900 01/14/22 1440 01/14/22 2144 01/15/22 0400  BP: (!) 164/69 (!) 160/71 (!) 166/70 (!) 169/66  Pulse: 63 65 71 66  Resp: '14 16 18 18  '$ Temp:  98.5 F (36.9 C) 98.6 F (37 C) 98.3 F (36.8 C)  TempSrc:  Oral Oral Oral  SpO2: 100% 100% 93% 98%   Supplemental O2: Room Air SpO2: 98 %   Physical Exam:  Constitutional: Chronically ill-appearing, no acute distress, appears older than stated age Neck: Neck mass with superficial edema and radiation skin changes Cardiovascular: regular rate and rhythm, no m/r/g Pulmonary/Chest: normal work of breathing on room air, lungs clear to auscultation bilaterally Abdominal: PEG tube in place, purulence present around tube, no erythema and surrounding skin MSK: Left BKA 1+ pitting edema lower extremities bilaterally Neurological: Alert and oriented x4 Skin: warm and dry Psych: Normal mood and affect  There were no vitals filed for this visit.   Intake/Output Summary (Last 24 hours) at 01/15/2022 0659 Last data filed at 01/15/2022 0500 Gross per 24 hour  Intake --  Output 1100 ml  Net -1100 ml   Net IO Since Admission: -743 mL [01/15/22 0659]  Pertinent Labs:    Latest Ref Rng & Units 01/15/2022    3:16 AM 01/13/2022    4:30 PM 12/25/2021    1:18 PM  CBC  WBC 4.0 - 10.5 K/uL 10.1   9.2   9.9    Hemoglobin 13.0 - 17.0 g/dL 8.0   8.3   8.5    Hematocrit 39.0 - 52.0 % 23.8   24.7   25.9    Platelets 150 - 400 K/uL 372   370   300         Latest Ref Rng & Units  01/15/2022    3:16 AM 01/14/2022    3:59 AM 01/13/2022    4:30 PM  CMP  Glucose 70 - 99 mg/dL 140   299   278    BUN 6 - 20 mg/dL 72   74   72    Creatinine 0.61 - 1.24 mg/dL 1.85   1.93   1.94    Sodium 135 - 145 mmol/L 128   126   126    Potassium 3.5 - 5.1 mmol/L 4.2   4.2   4.1    Chloride 98 - 111 mmol/L 93   89   87    CO2 22 - 32 mmol/L '30   28   29    '$ Calcium 8.9 - 10.3 mg/dL 8.6   8.6   8.8    Total Protein 6.5 - 8.1 g/dL   6.3    Total Bilirubin 0.3 - 1.2 mg/dL   0.7    Alkaline Phos 38 - 126 U/L   125    AST 15 - 41 U/L   16    ALT 0 - 44 U/L   11      Imaging: No results found.  Assessment/Plan:  Principal Problem:   Hyponatremia   Patient Summary: Arthur Burke is a 56 y.o. 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   Acute hypertonic hyponatremia Hypoalbuminemia Na corrected for glucose remains at 129 today. He is having some mild cramping in his upper extremities. Urine studies consistent with hypertonic hyponatremia. I think this is likely 2/2 to low albumin leading to third spacing although he has multiple factors that could be causing hyponatremia including increased free water. Albumin is not indicated in his case and I think maximizing nutrition will be solution for hyponatremia. I called and spoke with [name], RD, and she stated that she would evaluate patient today. -RD consult -decrease free water intake -Trend BMP  Acute on chronic lower extremity edema Malnutrition G-tube dependent Edema of lower extremities is improved from yesterday. He received diuresis yesterday. Likely that edema is  multi factorial insetting of increased free water intake, low albumin, and venous insufficiency. I do no think that additional diuresis is indicated at this time. Will work on nutrition status and could consider compression stockings if patient is amenable. -Dietitian consult pending  CKD 3b, AKI less likely, progression of renal disease ESRD status postrenal transplant Urinary retention Baseline creatinine thought to be around 1.7-2.  Creatinine at 1.85 from 1.93. Foley catheter was placed due to urinary retention. Patient is receiving home medication of tamsulosin while here. Will consider trial of removing foley catheter 6/7. -Avoid nephrotoxic medications -Daily BMP -Foley placed 6/6. Continue foley -Continue prednisone, tacrolimus, and mycophenolate -Continue Flomax   Squamous cell carcinoma s/p radiation treatment Neck swelling Patient reports difficulty swallowing.  Able to manage secretions and no signs of airway compromise last SLP evaluation was 5/15 with worsening will have speech reevaluate.  -N.p.o. until evaluation -deferred speech evaluation 6/5   Deconditioning -PT/OT recommending SNF   Type 1 diabetes mellitus Patient takes 22 units of glargine nightly.  Episode of hypoglycemia this AM at 47. Will decrease long-acting insulin to 18 units. -Glargine 18 units daily -Sensitive sliding scale   Iron deficiency anemia Chronic and stable, no signs of active bleeding.  Iron labs completed 3/23 showed iron 43, TIBC 151. -start iron supplement soln per tube   Hypertension Continue home coreg 25, clonidine 0.1, and and amlodipine 10.   Chronic pain Continued home oxycodone 20 Q4 PRN   GERD Continue home omeprazole.   HLD Continue home atorvastatin.   Diet: NPO VTE: Enoxaparin Code: Full PT/OT recs: Pending Family Update: patient stated he would prefer to update brother   Diet: Tube Feeds VTE: Enoxaparin Code: Full PT/OT recs: SNF for Subacute PT,  none. Family Update: Called and updated Arthur Burke   Dispo: Anticipated discharge to Skilled nursing facility in 1 days pending SNF replacement  Arthur Burke, D.O.  Internal Medicine Resident, PGY-1 [facility]  Pager: [phone] 6:59 AM, 01/15/2022   **Please contact the on call pager after 5 pm and on weekends at [phone].**

## 2022-01-15 NOTE — TOC Initial Note (Addendum)
Transition of Care [facility]) - Initial/Assessment Note    Patient Details  Name: Arthur Burke MRN: [id number] Date of Birth: 03-22-1966  Transition of Care [facility]) CM/SW Contact:    Joanne Chars, LCSW Phone Number: 01/15/2022, 3:59 PM  Clinical Narrative:  CSW met with pt regarding DC recommendation for SNF.  Pt reports his brother has made arrangements for him to go to [facility] in [place].  Permission given to speak with brother Shanon Brow.  Permission given to send out referral.  Pt lives alone, has feeding tube.  Pt has had one covid vaccine shot.                Referral faxed to [facility].  Expected Discharge Plan: Skilled Nursing Facility Barriers to Discharge: Continued Medical Work up, SNF Pending bed offer   Patient Goals and CMS Choice Patient states their goals for this hospitalization and ongoing recovery are:: get back home   Choice offered to / list presented to : Patient (pt reports brother has arranged for him to go to [facility])  Expected Discharge Plan and Services Expected Discharge Plan: [place] In-house Referral: Clinical Social Work   Post Acute Care Choice: [place] Living arrangements for the past 2 months: Apartment                                      Prior Living Arrangements/Services Living arrangements for the past 2 months: Apartment Lives with:: Self Patient language and need for interpreter reviewed:: Yes Do you feel safe going back to the place where you live?: Yes      Need for Family Participation in Patient Care: No (Comment) Care giver support system in place?: Yes (comment) Current home services: Other (comment) (feeding tube supplies) Criminal Activity/Legal Involvement Pertinent to Current Situation/Hospitalization: No - Comment as needed  Activities of Daily Living      Permission Sought/Granted Permission sought to share information with : Family  Supports Permission granted to share information with : Yes, Verbal Permission Granted  Share Information with NAME: brother Shanon Brow  Permission granted to share info w AGENCY: SNF        Emotional Assessment Appearance:: Appears older than stated age Attitude/Demeanor/Rapport: Engaged Affect (typically observed): Appropriate, Pleasant Orientation: : Oriented to Self, Oriented to Place, Oriented to  Time, Oriented to Situation      Admission diagnosis:  Hyponatremia [E87.1] Peripheral edema [R60.9] Patient Active Problem List   Diagnosis Date Noted   Pressure injury of skin 12/22/2021   Hypoglycemia 12/20/2021   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/09/2021   Nausea 11/07/2021   Malnutrition of moderate degree 10/26/2021   Type 1 diabetes mellitus with hyperglycemia ([place])    Dysphagia    Dehydration 10/21/2021   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   Fall    Essential hypertension    Unilateral complete BKA, left, sequela (HCC)    Leukocytosis    AKI (acute kidney injury) ([place])    Diabetic peripheral neuropathy associated with type 2 diabetes mellitus ([place])    Amputation of left lower extremity below knee ([place]) 08/26/2017   Anemia in chronic kidney disease    Unilateral complete BKA, left, subsequent encounter ([place])    Post-operative pain    Stage 3 chronic kidney disease (HCC)    Hyponatremia    Benign essential HTN    Acute blood loss anemia    S/P  BKA (below knee amputation) unilateral, left ([place]) 08/23/2017   Streptococcal bacteremia 08/23/2017   Normocytic anemia    History of kidney transplant    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 ([place])    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 ([place])    PCP:  Pcp, No Pharmacy:   [facility] [place], [place] - [address] AT [place] [address] Phone: [phone] Fax: [phone]  [place] [address] Phone: [phone] Fax: [phone]     Social Determinants of Health (SDOH) Interventions    Readmission Risk Interventions     View : No data to display.

## 2022-01-15 NOTE — Progress Notes (Addendum)
Pt has a hypoglycemic event this AM with blood sugar of 40's. Pt was awake and oriented. Orange juice was given via PEG tube. Rechecked blood sugar and it went up to 70's. Nepro supplement was given after. MD was informed. PEG tube dressing was changed as well. Airbed has been ordered.

## 2022-01-16 ENCOUNTER — Inpatient Hospital Stay (HOSPITAL_COMMUNITY): Payer: Medicare Other

## 2022-01-16 LAB — URINALYSIS, ROUTINE W REFLEX MICROSCOPIC
Bilirubin Urine: NEGATIVE
Glucose, UA: NEGATIVE mg/dL
Hgb urine dipstick: NEGATIVE
Ketones, ur: NEGATIVE mg/dL
Nitrite: NEGATIVE
Protein, ur: 100 mg/dL — AB
Specific Gravity, Urine: 1.014 (ref 1.005–1.030)
WBC, UA: >50 WBC/hpf — ABNORMAL HIGH (ref 0–5)
pH: 5 (ref 5.0–8.0)

## 2022-01-16 LAB — CBC
HCT: 23.8 % — ABNORMAL LOW (ref 39.0–52.0)
HCT: 24 % — ABNORMAL LOW (ref 39.0–52.0)
Hemoglobin: 7.9 g/dL — ABNORMAL LOW (ref 13.0–17.0)
Hemoglobin: 8.1 g/dL — ABNORMAL LOW (ref 13.0–17.0)
MCH: 28.5 pg (ref 26.0–34.0)
MCH: 29.3 pg (ref 26.0–34.0)
MCHC: 33.2 g/dL (ref 30.0–36.0)
MCHC: 33.8 g/dL (ref 30.0–36.0)
MCV: 85.9 fL (ref 80.0–100.0)
MCV: 87 fL (ref 80.0–100.0)
Platelets: 367 10*3/uL (ref 150–400)
Platelets: 405 10*3/uL — ABNORMAL HIGH (ref 150–400)
RBC: 2.77 MIL/uL — ABNORMAL LOW (ref 4.22–5.81)
RBC: 2.76 MIL/uL — ABNORMAL LOW (ref 4.22–5.81)
RDW: 16.8 % — ABNORMAL HIGH (ref 11.5–15.5)
RDW: 16.9 % — ABNORMAL HIGH (ref 11.5–15.5)
WBC: 16 10*3/uL — ABNORMAL HIGH (ref 4.0–10.5)
WBC: 15.9 10*3/uL — ABNORMAL HIGH (ref 4.0–10.5)
nRBC: 0 % (ref 0.0–0.2)
nRBC: 0 % (ref 0.0–0.2)

## 2022-01-16 LAB — DIFFERENTIAL
Abs Immature Granulocytes: 0.07 10*3/uL (ref 0.00–0.07)
Basophils Absolute: 0 10*3/uL (ref 0.0–0.1)
Basophils Relative: 0 %
Eosinophils Absolute: 0.1 10*3/uL (ref 0.0–0.5)
Eosinophils Relative: 0 %
Immature Granulocytes: 0 %
Lymphocytes Relative: 14 %
Lymphs Abs: 2.2 10*3/uL (ref 0.7–4.0)
Monocytes Absolute: 1.5 10*3/uL — ABNORMAL HIGH (ref 0.1–1.0)
Monocytes Relative: 9 %
Neutro Abs: 12.1 10*3/uL — ABNORMAL HIGH (ref 1.7–7.7)
Neutrophils Relative %: 77 %

## 2022-01-16 LAB — BASIC METABOLIC PANEL
Anion gap: 8 (ref 5–15)
BUN: 76 mg/dL — ABNORMAL HIGH (ref 6–20)
CO2: 31 mmol/L (ref 22–32)
Calcium: 8.3 mg/dL — ABNORMAL LOW (ref 8.9–10.3)
Chloride: 87 mmol/L — ABNORMAL LOW (ref 98–111)
Creatinine, Ser: 1.85 mg/dL — ABNORMAL HIGH (ref 0.61–1.24)
GFR, Estimated: 42 mL/min — ABNORMAL LOW (ref >60)
Glucose, Bld: 145 mg/dL — ABNORMAL HIGH (ref 70–99)
Potassium: 4.4 mmol/L (ref 3.5–5.1)
Sodium: 126 mmol/L — ABNORMAL LOW (ref 135–145)

## 2022-01-16 LAB — GLUCOSE, CAPILLARY
Glucose-Capillary: 175 mg/dL — ABNORMAL HIGH (ref 70–99)
Glucose-Capillary: 120 mg/dL — ABNORMAL HIGH (ref 70–99)
Glucose-Capillary: 109 mg/dL — ABNORMAL HIGH (ref 70–99)
Glucose-Capillary: 285 mg/dL — ABNORMAL HIGH (ref 70–99)
Glucose-Capillary: 384 mg/dL — ABNORMAL HIGH (ref 70–99)

## 2022-01-16 LAB — MRSA NEXT GEN BY PCR, NASAL: MRSA by PCR Next Gen: NOT DETECTED

## 2022-01-16 MED ORDER — SODIUM CHLORIDE 0.9 % IV SOLN
2.0000 g | Freq: Two times a day (BID) | INTRAVENOUS | Status: DC
  Administered 2022-01-16: 2 g via INTRAVENOUS
  Filled 2022-01-16: qty 12.5

## 2022-01-16 MED ORDER — VANCOMYCIN HCL 1250 MG/250ML IV SOLN
1250.0000 mg | Freq: Once | INTRAVENOUS | Status: DC
  Filled 2022-01-16: qty 250

## 2022-01-16 MED ORDER — VANCOMYCIN HCL 1250 MG/250ML IV SOLN
1250.0000 mg | INTRAVENOUS | Status: DC
Start: 2022-01-17 — End: 2022-01-16

## 2022-01-16 MED ORDER — SODIUM CHLORIDE 0.9 % IV SOLN
3.0000 g | Freq: Four times a day (QID) | INTRAVENOUS | Status: DC
  Administered 2022-01-16 – 2022-01-17 (×4): 3 g via INTRAVENOUS
  Filled 2022-01-16 (×4): qty 8

## 2022-01-16 MED ORDER — INSULIN ASPART 100 UNIT/ML IJ SOLN
4.0000 [IU] | Freq: Once | INTRAMUSCULAR | Status: AC
Start: 2022-01-16 — End: 2022-01-16
  Administered 2022-01-16: 4 [IU] via SUBCUTANEOUS

## 2022-01-16 NOTE — Progress Notes (Signed)
Pharmacy Antibiotic Note  Arthur Burke is a 56 y.o. male admitted on 01/13/2022 with volume overload and hyponatremia.  Hx ESRD, s/p renal transplant 2005. On immunosuppressants. Pharmacy has been consulted for empiric Cefepime and Vancomycin dosing for early signs of infection, unknown source.  Tmax 100. Blood cultures sent.  Sputum and urine cultures ordered.    CKD3, baseline creatinine 1.7-2.0, 1.85 x 2 days.  Plan: Cefepime 2 gm IV q12h. Vancomycin 1250 mg IV x 1 loading dose Vancomycin 1 gm IV Q 24 hrs to begin on 6/8. Goal AUC 400-550. Expected AUC: 512 SCr used: 1.85 Follow renal function, culture data, clinical progress and antibiotic plans.   Height: 6' (182.9 cm) (patient reported) Weight: 67.5 kg (148 lb 14.4 oz) IBW/kg (Calculated) : 77.6  Temp (24hrs), Avg:99.4 F (37.4 C), Min:98.3 F (36.8 C), Max:100 F (37.8 C)  Recent Labs  Lab 01/13/22 1630 01/14/22 0359 01/15/22 0316 01/16/22 0505  WBC 9.2  --  10.1 15.9*  16.0*  CREATININE 1.94* 1.93* 1.85* 1.85*    Estimated Creatinine Clearance: 43.1 mL/min (A) (by C-G formula based on SCr of 1.85 mg/dL (H)).    Allergies  Allergen Reactions   Zolpidem Other (See Comments)    Other Reaction: CNS Disorder    Antimicrobials this admission:  Cefepime 6/7 >>  Vancomycin 6/7 >>  Dose adjustments this admission:  n/a  Microbiology results:  6/7 blood: collected  6/7 sputum:  6/7 urine:  Thank you for allowing pharmacy to be a part of this patient's care.  Arty Baumgartner, [place] 01/16/2022 11:33 AM

## 2022-01-16 NOTE — Progress Notes (Signed)
Physical Therapy Treatment Patient Details Name: Arthur Burke MRN: [id number] DOB: [birth year] Today's Date: 01/16/2022   History of Present Illness Pt is a 56 y/o male presenting on 6/4 with LE edema and throat swelling. Admitted with hyponatremia, AKI. 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    PT Comments    Pt received supine and agreeable to session with good progress towards goals and noted improvement in safety awareness. Pt able to come to sitting EOB with mod I with increased time needed and use of bed rails. Pt able to come to standing in standing scale with min assist to steady on rise and to facilitate anterior weight shift. Pt able to come to standing x3 and maintain statically without assist throughout session from EOB and WC with min assist. Pt demonstrating ability to squat pivot EOB>WC and WC>recliner with min assist to block R knee and pivot hips with set up and pivoting to R. Pt stating his brother could bring prosthesis stating edema in LLE decreased and prosthesis would liekly fit again, encouraged pt to contact brother and plan to progress gait in next session. Pt continues to benefit from skilled PT services to progress toward functional mobility goals.    Recommendations for follow up therapy are one component of a multi-disciplinary discharge planning process, led by the attending physician.  Recommendations may be updated based on patient status, additional functional criteria and insurance authorization.  Follow Up Recommendations  Skilled nursing-short term rehab (<3 hours/day)     Assistance Recommended at Discharge Intermittent Supervision/Assistance  Patient can return home with the following A little help with walking and/or transfers;A little help with bathing/dressing/bathroom;Assistance with cooking/housework;Direct  supervision/assist for medications management;Help with stairs or ramp for entrance;Assist for transportation   Equipment Recommendations  None recommended by PT    Recommendations for Other Services       Precautions / Restrictions Precautions Precautions: Fall Precaution Comments: Lt BKA, peg Restrictions Weight Bearing Restrictions: No     Mobility  Bed Mobility Overal bed mobility: Modified Independent             General bed mobility comments: increased time and use of bedrails    Transfers Overall transfer level: Needs assistance Equipment used: Rolling walker (2 wheels) Transfers: Bed to chair/wheelchair/BSC, Sit to/from Stand Sit to Stand: Min assist     Squat pivot transfers: Min assist     General transfer comment: pt able to come to standing with min assist from EOB to standing scale, also min assist to rise from EOB and WC x2 with RW. Attempted hop pivot on RLE, pt unable, stating he did not trust hiw arms to support him, able to squat pivot to R from EOB>WC, WC>recliner with min assist with good safety    Ambulation/Gait               General Gait Details: prosthesis not in room to attempt ambulation, pt stating his brother could bring it, encouraged pt to contact brother, pt stating edema in LLE decreased and prosthesis would liekly fit again   Theme park manager mobility: Yes Wheelchair propulsion: Both upper extremities, Right lower extremity Wheelchair parts: Independent Distance: 21 Wheelchair Assistance Details (indicate cue type and reason): min assist to navigate as pt with vision imparments, pt  able to propel with BUE, distance limtied to pt stated fatigue/spasm in UE  Modified Rankin (Stroke Patients Only)       Balance Overall balance assessment: Needs assistance Sitting-balance support: No upper extremity supported, Feet supported Sitting balance-Leahy Scale: Good      Standing balance support: Bilateral upper extremity supported, During functional activity Standing balance-Leahy Scale: Poor Standing balance comment: relies on BUE support                            Cognition Arousal/Alertness: Awake/alert Behavior During Therapy: WFL for tasks assessed/performed Overall Cognitive Status: No family/caregiver present to determine baseline cognitive functioning Area of Impairment: Safety/judgement, Awareness, Problem solving                         Safety/Judgement: Decreased awareness of safety Awareness: Emergent Problem Solving: Slow processing, Requires verbal cues General Comments: pt with decreased safety awareness and problem sovling        Exercises      General Comments General comments (skin integrity, edema, etc.): VSS      Pertinent Vitals/Pain Pain Assessment Pain Assessment: Faces Faces Pain Scale: Hurts a little bit Pain Location: LUE Pain Descriptors / Indicators: Discomfort, Spasm Pain Intervention(s): Monitored during session, Limited activity within patient's tolerance, Repositioned    Home Living                          Prior Function            PT Goals (current goals can now be found in the care plan section) Acute Rehab PT Goals Patient Stated Goal: regain strength and independence PT Goal Formulation: With patient Time For Goal Achievement: 01/28/22    Frequency    Min 3X/week      PT Plan      Co-evaluation              AM-PAC PT "6 Clicks" Mobility   Outcome Measure  Help needed turning from your back to your side while in a flat bed without using bedrails?: None Help needed moving from lying on your back to sitting on the side of a flat bed without using bedrails?: None Help needed moving to and from a bed to a chair (including a wheelchair)?: A Little Help needed standing up from a chair using your arms (e.g., wheelchair or bedside chair)?: A  Lot Help needed to walk in hospital room?: A Lot Help needed climbing 3-5 steps with a railing? : Total 6 Click Score: 16    End of Session Equipment Utilized During Treatment: Gait belt Activity Tolerance: Patient tolerated treatment well Patient left: in chair;with call bell/phone within reach;with nursing/sitter in room Nurse Communication: Mobility status PT Visit Diagnosis: Unsteadiness on feet (R26.81);Muscle weakness (generalized) (M62.81);Difficulty in walking, not elsewhere classified (R26.2)     Time: 3267-1245 PT Time Calculation (min) (ACUTE ONLY): 24 min  Charges:  $Therapeutic Activity: 23-37 mins                     Rane Dumm R. PTA Acute Rehabilitation Services Office: [place] 01/16/2022, 10:23 AM

## 2022-01-16 NOTE — Progress Notes (Addendum)
HD#2 Subjective:  Overnight Events: elevated temperature at 100F.  Patient assessed at bedside. He is sleepy and reports continued cramping pain throughout upper extremities. He continues to have phlegm that is unchanged from admission. Denies coughing, abdominal pain, dysuria prior to foley catheter placement, and his throat swelling seems to be improving.  Pt is updated on the plan for today, and all questions and concerns are addressed.   Objective:  Vital signs in last 24 hours: Vitals:   01/15/22 0837 01/15/22 2020 01/16/22 0500 01/16/22 0603  BP: (!) 151/70 (!) 162/63  (!) 164/63  Pulse: 65 68  76  Resp: 18     Temp: 98.2 F (36.8 C) 98.3 F (36.8 C)  100 F (37.8 C)  TempSrc: Oral Oral  Oral  SpO2: 100% 94%  91%  Weight:   71.3 kg   Height:       Supplemental O2: Room Air SpO2: 91 %   Physical Exam:  Constitutional: sleepy, closes eyes during conversation Neck: post-radiation changes to neck with erythema Cardiovascular: regular rate and rhythm, no m/r/g Pulmonary/Chest: normal work of breathing on room air, lungs clear to auscultation bilaterally Abdominal: soft, non-tender, non-distended MSK: 1+ pitting edema to lower extremities bilaterally Neurological: oriented x4, closes eyes during conversation Skin: warm and dry Psych: flat affect  Filed Weights   01/15/22 0800 01/16/22 0500  Weight: 69.2 kg 71.3 kg     Intake/Output Summary (Last 24 hours) at 01/16/2022 0627 Last data filed at 01/16/2022 0500 Gross per 24 hour  Intake 0 ml  Output 1000 ml  Net -1000 ml   Net IO Since Admission: -1,743 mL [01/16/22 0627]  Pertinent Labs:    Latest Ref Rng & Units 01/16/2022    5:05 AM 01/15/2022    3:16 AM 01/13/2022    4:30 PM  CBC  WBC 4.0 - 10.5 K/uL 16.0   10.1   9.2    Hemoglobin 13.0 - 17.0 g/dL 7.9   8.0   8.3    Hematocrit 39.0 - 52.0 % 23.8   23.8   24.7    Platelets 150 - 400 K/uL 367   372   370         Latest Ref Rng & Units 01/16/2022    5:05  AM 01/15/2022    3:16 AM 01/14/2022    3:59 AM  CMP  Glucose 70 - 99 mg/dL 145   140   299    BUN 6 - 20 mg/dL 76   72   74    Creatinine 0.61 - 1.24 mg/dL 1.85   1.85   1.93    Sodium 135 - 145 mmol/L 126   128   126    Potassium 3.5 - 5.1 mmol/L 4.4   4.2   4.2    Chloride 98 - 111 mmol/L 87   93   89    CO2 22 - 32 mmol/L '31   30   28    '$ Calcium 8.9 - 10.3 mg/dL 8.3   8.6   8.6      Imaging: No results found.  Assessment/Plan:   Principal Problem:   Hyponatremia   Patient Summary: Arthur Burke is a 56 y.o. 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 He did have elevated temperature and leukocytosis.  Leukocytosis Elevated temperature Patient is immunosuppressed with prograf and low-dose prednisone for kidney transplant.  He had an elevated temperature to 100 F overnight.  Leukocytosis at 16 with neutrophil predominance today.  On exam patient appears more sleepy and less alert compared to prior day.  He denies cough, abdominal pain, and dysuria.  In the setting of him being immunosuppressed we will get broad work-up to further evaluate what could be causing leukocytosis and elevated temperature. CXR showed small left pleural effusion with new overlying left basilar airspace opacities concerning for pneumonia. Plan to start broad-spectrum antibiotics once cultures have been collected. -Blood culture, sputum culture pending -IV unasyn  Acute on chronic hypertonic hyponatremia Hypoalbuminemia Sodium corrected with glucoses at 127 which is 2 points lower compared to yesterday.  He continues to have mild cramping in his upper  extremities that usually occurs whenever he has low sodium.  He was evaluated by registered dietitian yesterday with new recommendations in place.  I spoke with patient about receiving tube feeds as well as protein modulators 3 times daily.  We will continue to decrease free water flushes to help patient get more balance with solute. -Osmolite bolus feeds.  Plan to start at half a container and increase by half container until goal feeds are at 1.5 4 times daily -45 mL Prosource tube feeds 3 times daily -Full liquid diet p.o. for comfort per SLP -decrease free water intake -Trend BMP   Acute on chronic lower extremity edema Malnutrition of moderate degree G-tube dependent Appreciate registered dietitian recommendations.  Will begin to titrate tube feeds up as tolerated. -Dietitian recommendations appreciated   CKD 3b, AKI less likely, progression of renal disease ESRD status postrenal transplant Urinary retention Baseline creatinine thought to be around 1.7-2.  Creatinine at 1.85 from 1.93. Foley catheter was placed due to urinary retention. Patient is receiving home medication of tamsulosin while here. Will consider trial of removing foley catheter 6/7. -Avoid nephrotoxic medications -Daily BMP -Foley placed 6/6. Continue foley. Will consider trial tomorrow.  -Continue prednisone, tacrolimus, and mycophenolate -Continue Flomax   Squamous cell carcinoma s/p radiation treatment Neck swelling Patient reports difficulty swallowing.  Able to manage secretions and no signs of airway compromise last SLP evaluation was 5/15 with worsening will have speech reevaluate.  -deferred speech evaluation 6/5   Deconditioning -PT/OT recommending SNF   Type 1 diabetes mellitus Patient takes 22 units of glargine nightly.  Episode of hypoglycemia this AM at 47. Will decrease long-acting insulin to 18 units. -Glargine 18 units daily -Sensitive sliding scale   Iron deficiency anemia Chronic and  stable, no signs of active bleeding.  Iron labs completed 3/23 showed iron 43, TIBC 151. -continue iron supplement soln per tube   Hypertension Continue home coreg 25, clonidine 0.1, and and amlodipine 10.   Chronic pain Continued home oxycodone 20 Q4 PRN   GERD Continue home omeprazole.   HLD Continue home atorvastatin.  Diet: Tube Feeds VTE: Enoxaparin Code: Full PT/OT recs: SNF for Subacute PT. Family Update: updated brother Arthur Burke   Dispo: Anticipated discharge to Skilled nursing facility in 2-3 days pending improvement in leukocytosis and hyponatremia.   Kenniel Bergsma M. Madasyn Heath, D.O.  Internal Medicine Resident, PGY-1 [facility]  Pager: [phone] 6:27 AM, 01/16/2022   **Please contact the on call pager after 5 pm and on weekends  at [phone].**

## 2022-01-16 NOTE — Progress Notes (Signed)
Mobility Specialist Progress Note   01/16/22 1721  Mobility  Activity Transferred from bed to chair  Level of Assistance Minimal assist, patient does 75% or more  Assistive Device Front wheel walker  Distance Ambulated (ft) 2 ft  Activity Response Tolerated well  $Mobility charge 1 Mobility   Pre Mobility: 61 HR, BP, 95% SpO2 During Mobility: 75 HR, BP, 92%  SpO2 Post Mobility: 64 HR, 94% SpO2  Received in bed having no complaints and agreeable. Mod I for bed mobility and min A for STS from an elevated surface. Blocked pt's L elbow while using RW d/t claimed LLE weakness, x1 slight buckle of L arm but able to pivot to chair w/o fault. Left w/ call bell in reach and RN notified about transfer.   Holland Falling Mobility Specialist Phone Number [phone]

## 2022-01-16 NOTE — Plan of Care (Signed)
  Problem: Coping: Goal: Ability to adjust to condition or change in health will improve Outcome: Progressing   Problem: Fluid Volume: Goal: Ability to maintain a balanced intake and output will improve Outcome: Progressing   

## 2022-01-16 NOTE — TOC Progression Note (Addendum)
Transition of Care [facility]) - Progression Note    Patient Details  Name: Arthur Burke MRN: [id number] Date of Birth: [birth year]  [facility] CM/SW Contact  Joanne Chars, LCSW Phone Number: 01/16/2022, 2:33 PM  Clinical Narrative:   CSW made several calls to [facility], finally spoke with reception and was directed to administrator Jan.  Another message left with her.  1500: CSW spoke with pt, he provided name of his sister in law, Caren Griffins, [phone], who is employee at [employer].CSW spoke with her and she had already spoken to Jan about pt coming there.  She will talk to Jan again and ask her to call.  Expected Discharge Plan: [place] Barriers to Discharge: Continued Medical Work up, SNF Pending bed offer  Expected Discharge Plan and Services Expected Discharge Plan: [place] In-house Referral: Clinical Social Work   Post Acute Care Choice: [place] Living arrangements for the past 2 months: Apartment                                       Social Determinants of Health (SDOH) Interventions    Readmission Risk Interventions     View : No data to display.

## 2022-01-17 ENCOUNTER — Other Ambulatory Visit (HOSPITAL_COMMUNITY): Payer: Self-pay

## 2022-01-17 LAB — CBC
HCT: 21.1 % — ABNORMAL LOW (ref 39.0–52.0)
Hemoglobin: 7 g/dL — ABNORMAL LOW (ref 13.0–17.0)
MCH: 29 pg (ref 26.0–34.0)
MCHC: 33.2 g/dL (ref 30.0–36.0)
MCV: 87.6 fL (ref 80.0–100.0)
Platelets: 292 10*3/uL (ref 150–400)
RBC: 2.41 MIL/uL — ABNORMAL LOW (ref 4.22–5.81)
RDW: 16.4 % — ABNORMAL HIGH (ref 11.5–15.5)
WBC: 10.5 10*3/uL (ref 4.0–10.5)
nRBC: 0 % (ref 0.0–0.2)

## 2022-01-17 LAB — BASIC METABOLIC PANEL
Anion gap: 7 (ref 5–15)
BUN: 89 mg/dL — ABNORMAL HIGH (ref 6–20)
CO2: 29 mmol/L (ref 22–32)
Calcium: 7.9 mg/dL — ABNORMAL LOW (ref 8.9–10.3)
Chloride: 89 mmol/L — ABNORMAL LOW (ref 98–111)
Creatinine, Ser: 1.96 mg/dL — ABNORMAL HIGH (ref 0.61–1.24)
GFR, Estimated: 40 mL/min — ABNORMAL LOW (ref >60)
Glucose, Bld: 386 mg/dL — ABNORMAL HIGH (ref 70–99)
Potassium: 4.8 mmol/L (ref 3.5–5.1)
Sodium: 125 mmol/L — ABNORMAL LOW (ref 135–145)

## 2022-01-17 LAB — GLUCOSE, CAPILLARY
Glucose-Capillary: 172 mg/dL — ABNORMAL HIGH (ref 70–99)
Glucose-Capillary: 187 mg/dL — ABNORMAL HIGH (ref 70–99)
Glucose-Capillary: 364 mg/dL — ABNORMAL HIGH (ref 70–99)
Glucose-Capillary: 381 mg/dL — ABNORMAL HIGH (ref 70–99)
Glucose-Capillary: 414 mg/dL — ABNORMAL HIGH (ref 70–99)
Glucose-Capillary: 521 mg/dL (ref 70–99)

## 2022-01-17 LAB — EXPECTORATED SPUTUM ASSESSMENT W GRAM STAIN, RFLX TO RESP C

## 2022-01-17 LAB — PREPARE RBC (CROSSMATCH)

## 2022-01-17 LAB — HEMOGLOBIN AND HEMATOCRIT, BLOOD
HCT: 24.4 % — ABNORMAL LOW (ref 39.0–52.0)
Hemoglobin: 8.1 g/dL — ABNORMAL LOW (ref 13.0–17.0)

## 2022-01-17 MED ORDER — POLYETHYLENE GLYCOL 3350 17 G PO PACK
17.0000 g | PACK | Freq: Every day | ORAL | Status: DC
  Administered 2022-01-17 – 2022-01-18 (×2): 17 g via ORAL
  Filled 2022-01-17 (×5): qty 1

## 2022-01-17 MED ORDER — INSULIN ASPART 100 UNIT/ML IJ SOLN
3.0000 [IU] | Freq: Four times a day (QID) | INTRAMUSCULAR | Status: DC
  Administered 2022-01-17 (×2): 3 [IU] via SUBCUTANEOUS

## 2022-01-17 MED ORDER — SALINE SPRAY 0.65 % NA SOLN: 1.0000 | NASAL | Status: DC | PRN

## 2022-01-17 MED ORDER — INSULIN ASPART 100 UNIT/ML IJ SOLN
9.0000 [IU] | Freq: Once | INTRAMUSCULAR | Status: AC
Start: 2022-01-17 — End: 2022-01-17
  Administered 2022-01-17: 9 [IU] via SUBCUTANEOUS

## 2022-01-17 MED ORDER — AMOXICILLIN-POT CLAVULANATE 875-125 MG PO TABS: 1.0000 | ORAL_TABLET | Freq: Two times a day (BID) | ORAL | Status: DC

## 2022-01-17 MED ORDER — SODIUM CHLORIDE 0.9% IV SOLUTION: Freq: Once | INTRAVENOUS | Status: AC

## 2022-01-17 MED ORDER — AMOXICILLIN-POT CLAVULANATE 400-57 MG/5ML PO SUSR
800.0000 mg | Freq: Two times a day (BID) | ORAL | Status: AC
  Administered 2022-01-17 – 2022-01-20 (×8): 800 mg
  Filled 2022-01-17 (×9): qty 10

## 2022-01-17 MED ORDER — INSULIN ASPART 100 UNIT/ML IJ SOLN
7.0000 [IU] | Freq: Once | INTRAMUSCULAR | Status: AC
  Administered 2022-01-17: 7 [IU] via SUBCUTANEOUS

## 2022-01-17 MED ORDER — INSULIN ASPART 100 UNIT/ML IJ SOLN
0.0000 [IU] | Freq: Four times a day (QID) | INTRAMUSCULAR | Status: DC
  Administered 2022-01-17: 9 [IU] via SUBCUTANEOUS
  Administered 2022-01-17: 2 [IU] via SUBCUTANEOUS
  Administered 2022-01-17: 5 [IU] via SUBCUTANEOUS
  Administered 2022-01-18 (×2): 9 [IU] via SUBCUTANEOUS
  Administered 2022-01-18: 7 [IU] via SUBCUTANEOUS
  Administered 2022-01-18: 2 [IU] via SUBCUTANEOUS
  Administered 2022-01-19 (×2): 5 [IU] via SUBCUTANEOUS
  Administered 2022-01-19: 9 [IU] via SUBCUTANEOUS
  Administered 2022-01-19: 2 [IU] via SUBCUTANEOUS

## 2022-01-17 NOTE — Progress Notes (Signed)
Mobility Specialist Progress Note   01/17/22 1150  Mobility  Activity Turned to back - supine  Level of Assistance Standby assist, set-up cues, supervision of patient - no hands on  Assistive Device None  Activity Response Tolerated well  $Mobility charge 1 Mobility   Pre Mobility: 74 HR, 93% SpO2 During Mobility: 88  HR, 90% SpO2 Post Mobility: 81 HR, BP, 91% SpO2  Received in bed c/o being tired and sore, also stating to be having a transfusion today and requested not to sit in chair for the time being. Pt agreeable to bed level exercises, focused on lower limb exercises which the pt had no problem finishing in succession. Left supine in bed eager for more exercises. Will f/u later today if time permits.  Holland Falling Mobility Specialist Phone Number [phone]

## 2022-01-17 NOTE — Progress Notes (Addendum)
HD#3 Subjective:  Overnight Events: hyperglycemia to 400s  Patient denies any complaints this AM. Understands need for blood transfusion. Pt is updated on the plan for today, and all questions and concerns are addressed.   Objective:  Vital signs in last 24 hours: Vitals:   01/17/22 1322 01/17/22 1345 01/17/22 1400 01/17/22 1415  BP: (!) 145/60 (!) 144/79  (!) 147/64  Pulse: 70  71   Resp: 17  16   Temp: 98.8 F (37.1 C) 98.3 F (36.8 C)  99.1 F (37.3 C)  TempSrc: Oral Oral  Oral  SpO2: 90% 94%  92%  Weight:      Height:       Supplemental O2: Room Air SpO2: 92 %   Physical Exam:  Constitutional: alert, chronically ill appearing, NAD, frequently spitting in cup during interview  Neck: post-radiation changes to neck with erythema Cardiovascular: regular rate and rhythm, no m/r/g Pulmonary/Chest: normal work of breathing on room air, lungs clear to auscultation bilaterally Abdominal: soft, non-tender, non-distended MSK: left BKA, no edema  Neurological: oriented x4, closes eyes during conversation Skin: warm and dry Psych: flat affect  Filed Weights   01/15/22 0800 01/16/22 0500 01/16/22 0900  Weight: 69.2 kg 71.3 kg 67.5 kg     Intake/Output Summary (Last 24 hours) at 01/17/2022 1512 Last data filed at 01/17/2022 0452 Gross per 24 hour  Intake 100 ml  Output 753 ml  Net -653 ml   Net IO Since Admission: -2,396 mL [01/17/22 1512]  Pertinent Labs:    Latest Ref Rng & Units 01/17/2022    8:47 AM 01/16/2022    5:05 AM 01/15/2022    3:16 AM  CBC  WBC 4.0 - 10.5 K/uL 10.5  16.0    15.9  10.1   Hemoglobin 13.0 - 17.0 g/dL 7.0  7.9    8.1  8.0   Hematocrit 39.0 - 52.0 % 21.1  23.8    24.0  23.8   Platelets 150 - 400 K/uL 292  367    405  372        Latest Ref Rng & Units 01/17/2022    4:12 AM 01/16/2022    5:05 AM 01/15/2022    3:16 AM  CMP  Glucose 70 - 99 mg/dL 386  145  140   BUN 6 - 20 mg/dL 89  76  72   Creatinine 0.61 - 1.24 mg/dL 1.96  1.85  1.85    Sodium 135 - 145 mmol/L 125  126  128   Potassium 3.5 - 5.1 mmol/L 4.8  4.4  4.2   Chloride 98 - 111 mmol/L 89  87  93   CO2 22 - 32 mmol/L '29  31  30   '$ Calcium 8.9 - 10.3 mg/dL 7.9  8.3  8.6     Imaging: No results found.  Assessment/Plan:   Principal Problem:   Hyponatremia   Patient Summary: Arthur Burke is a 56 y.o. 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  Also found to have leukocytosis fevers and chest radiograph consistent with pneumonia. Hgb dropped to 7 today, he will receive 1 unit of RBCs. Patient has bed  for tomorrow and will be medically stable for discharge if hgb remains stable.  Leukocytosis Elevated temperature Possible CAP Patient is immunosuppressed with prograf and low-dose prednisone for kidney transplant.  Afebrile overnight, leukocytosis improved to 10.5 from 16 yesterday. CXR 6/7 showed small left pleural effusion with new overlying left basilar airspace opacities concerning for pneumonia.  Blood cultures show no growth to date.  Transition from Unasyn to Augmentin today. -Follow blood cultures -Continue Augmentin for total abx course of 5 days  Acute on chronic hypertonic hyponatremia Hypoalbuminemia Sodium corrected with glucoses at 130, improving.  Free water flushes discontinued.  Patient receiving tube feeds and protein modulators 3 times daily. -Continue tube feeds  -45 mL Prosource tube feeds 3 times daily -Full liquid diet p.o. for comfort per SLP -Trend BMP  Acute on chronic anemia Iron deficiency anemia Hemoglobin 7 down from 8.1.  No signs or symptoms of active bleeding.  Ordered 1  unit PRBC.  Likely multifactorial from malnutrition, iron deficiency and CKD. -Follow-up H&H -Continue iron supplement soln per tube  Acute on chronic lower extremity edema, resolved Malnutrition of moderate degree G-tube dependent No edema on exam today. Appreciate registered dietitian recommendations.  Will begin to titrate tube feeds up as tolerated. -Dietitian recommendations appreciated   CKD 3b, AKI less likely, progression of renal disease ESRD status postrenal transplant Urinary retention Cr 1.96<1.85<1.93. Baseline creatinine thought to be around 1.7-2. Foley catheter was placed due to urinary retention. Patient is receiving home medication of tamsulosin while here. Will attempt voiding trial today. -Avoid nephrotoxic medications -Daily BMP -Foley placed 6/6, voiding trial today   -Continue prednisone, tacrolimus, and mycophenolate -Continue Flomax   Squamous cell carcinoma s/p radiation treatment Neck swelling Patient reports difficulty swallowing.  Able to manage secretions and no signs of airway compromise. SLP evaluation recommended full liquid diet and ice cream for comfort, patient to rely on PEG for nutrition.   Deconditioning -PT/OT recommending SNF   Type 1 diabetes mellitus Patient takes 22 units of glargine nightly.  Blood glucose has been fluctuating, patient was not receiving short acting insulin with tube feeds yesterday.  Adjusted SSI to 4 times daily with an additional 3 units 4 times daily.  Will continue long-acting 18 units daily and titrate as needed -Glargine 18 units daily -SSI 4 times daily with tube feeds -Additional 3 units NovoLog 4 times daily   Hypertension Continue home coreg 25, clonidine 0.1, and and amlodipine 10.   Chronic pain Continued home oxycodone 20 Q4 PRN   GERD Continue home omeprazole.   HLD Continue home atorvastatin.  Diet: Tube Feeds VTE: Enoxaparin Code: Full PT/OT recs: SNF for Subacute PT. Family Update: updated  brother Shanon Brow   Dispo: Anticipated discharge to Skilled nursing facility in 1 day to SNF. Bed confirmed with social work.  Makalya Nave M. Ova Meegan, D.O.  Internal Medicine Resident, PGY-1 [facility]  Pager: [phone] 3:40 PM, 01/17/2022   **Please contact the on call pager after 5 pm and on weekends at [phone].**

## 2022-01-17 NOTE — Care Management Important Message (Signed)
Important Message  Patient Details  Name: Arthur Burke MRN: [id number] Date of Birth: Oct 25, 1965   Medicare Important Message Given:  Yes     Chancellor Vanderloop Montine Circle 01/17/2022, 4:45 PM

## 2022-01-17 NOTE — Progress Notes (Signed)
Inpatient Diabetes Program Recommendations  AACE/ADA: New Consensus Statement on Inpatient Glycemic Control (2015)  Target Ranges:  Prepandial:   less than 140 mg/dL      Peak postprandial:   less than 180 mg/dL (1-2 hours)      Critically ill patients:  140 - 180 mg/dL   Lab Results  Component Value Date   GLUCAP 172 (H) 01/17/2022   HGBA1C 8.2 (H) 10/21/2021    Review of Glycemic Control  Latest Reference Range & Units 01/16/22 15:51 01/16/22 20:00 01/16/22 23:59 01/17/22 03:04 01/17/22 07:54  Glucose-Capillary 70 - 99 mg/dL 285 (H) 384 (H) 521 (HH) 414 (H) 172 (H)  (HH): Data is critically high Diabetes history: DM type 1 Outpatient Diabetes medications: Lantus 22 units Daily, Humalog 0-9 units tid  Current orders for Inpatient glycemic control:  Semglee 18 units QHS Novolog 0-9 units QID Osmolite added QID  Prednisone 5 mg QD (s/p transplant)  Inpatient Diabetes Program Recommendations:    With start of tube feeds and patient being type 1 DM: Will need CHO coverage.  Please consider adding Novolog 3 units QID to match the times of the feeds (to be stopped or held in the event tube feeds are stopped).   Thanks, Bronson Curb, MSN, RNC-OB Diabetes Coordinator [phone] (8a-5p)

## 2022-01-17 NOTE — Progress Notes (Addendum)
Occupational Therapy Treatment Patient Details Name: Arthur Burke MRN: [id number] DOB: Oct 22, 1965 Today's Date: 01/17/2022   History of present illness Pt is a 56 y/o male presenting on 6/4 with LE edema and throat swelling. Admitted with hyponatremia, AKI. 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   OT comments  Pt agreeable to UE exercises, completed 2 sets 8 reps of shoulder flexion and abduction using level 1 theraband.  Good recall with exercises, but requires cueing for problem solving with theraband and loops due to visual deficits.  Provide handout for services of the blind, pt thankful for resource. Patient reports feeling fatigued, plans for transfusion today due to low hemoglobin.  Eager for rehab, reports hopefully tomorrow.  Will follow acutely.    Recommendations for follow up therapy are one component of a multi-disciplinary discharge planning process, led by the attending physician.  Recommendations may be updated based on patient status, additional functional criteria and insurance authorization.    Follow Up Recommendations  Skilled nursing-short term rehab (<3 hours/day)    Assistance Recommended at Discharge Frequent or constant Supervision/Assistance  Patient can return home with the following  A little help with walking and/or transfers;A lot of help with bathing/dressing/bathroom;Assistance with cooking/housework;Direct supervision/assist for medications management;Direct supervision/assist for financial management;Assist for transportation;Help with stairs or ramp for entrance   Equipment Recommendations  Other (comment) (defer)    Recommendations for Other Services      Precautions / Restrictions Precautions Precautions: Fall Precaution Comments: Lt BKA, peg Restrictions Weight Bearing Restrictions: No        Mobility Bed Mobility                    Transfers                         Balance                                           ADL either performed or assessed with clinical judgement   ADL                                         General ADL Comments: focused on exercises, pt declined OOB    Extremity/Trunk Assessment              Vision   Additional Comments: provided services of the blind handout, pt thankful for resource   Perception     Praxis      Cognition Arousal/Alertness: Awake/alert Behavior During Therapy: WFL for tasks assessed/performed Overall Cognitive Status: No family/caregiver present to determine baseline cognitive functioning Area of Impairment: Safety/judgement, Awareness, Problem solving                         Safety/Judgement: Decreased awareness of safety Awareness: Emergent Problem Solving: Slow processing, Requires verbal cues General Comments: good recall of exercises, increased time for processing and problem solving through exercises        Exercises Exercises: General Upper Extremity General Exercises - Upper Extremity Shoulder Flexion: Strengthening, Both, Other reps (comment), Seated, Theraband (8 reps, 2 sets) Theraband Level (  Shoulder Flexion): Level 1 (Yellow) Shoulder ABduction: Strengthening, Other reps (comment), Both, Seated, Theraband (8 reps, 2 sets) Theraband Level (Shoulder Abduction): Level 1 (Yellow)    Shoulder Instructions       General Comments      Pertinent Vitals/ Pain       Pain Assessment Pain Assessment: Faces Pain Score: 0-No pain  Home Living                                          Prior Functioning/Environment              Frequency  Min 2X/week        Progress Toward Goals  OT Goals(current goals can now be found in the care plan section)  Progress towards OT goals: Progressing toward  goals  Acute Rehab OT Goals Patient Stated Goal: get to rehab OT Goal Formulation: With patient Time For Goal Achievement: 01/28/22 Potential to Achieve Goals: Good  Plan Discharge plan remains appropriate;Frequency remains appropriate    Co-evaluation                 AM-PAC OT "6 Clicks" Daily Activity     Outcome Measure   Help from another person eating meals?: A Little Help from another person taking care of personal grooming?: A Little Help from another person toileting, which includes using toliet, bedpan, or urinal?: A Lot Help from another person bathing (including washing, rinsing, drying)?: A Lot Help from another person to put on and taking off regular upper body clothing?: A Little Help from another person to put on and taking off regular lower body clothing?: A Lot 6 Click Score: 13    End of Session    OT Visit Diagnosis: Other abnormalities of gait and mobility (R26.89);Muscle weakness (generalized) (M62.81)   Activity Tolerance Patient tolerated treatment well   Patient Left in bed;with call bell/phone within reach   Nurse Communication Mobility status        Time: 4259-5638 OT Time Calculation (min): 16 min  Charges: OT General Charges $OT Visit: 1 Visit OT Treatments $Therapeutic Exercise: 8-22 mins  [place] Office [phone]   Delight Stare 01/17/2022, 1:04 PM

## 2022-01-17 NOTE — TOC Progression Note (Addendum)
Transition of Care [facility]) - Progression Note    Patient Details  Name: Arthur Burke MRN: [id number] Date of Birth: 1966/04/01  Transition [facility] CM/SW Contact  Joanne Chars, LCSW Phone Number: 01/17/2022, 11:02 AM  Clinical Narrative:   CSW received message from Jan, Scientist, physiological at [employer].  They did not receive the fax.  Referral refaxed.  1100: TC message left with Jan to confirm she received referral.    1135: TC Jan/[place].  She did receive referral and they can accept pt tomorrow, "the earlier the better."  MD informed.    Expected Discharge Plan: [place] Barriers to Discharge: Continued Medical Work up, SNF Pending bed offer  Expected Discharge Plan and Services Expected Discharge Plan: [employer] In-house Referral: Clinical Social Work   Post Acute Care Choice: [place] Living arrangements for the past 2 months: Apartment                                       Social Determinants of Health (SDOH) Interventions    Readmission Risk Interventions     No data to display

## 2022-01-18 ENCOUNTER — Inpatient Hospital Stay (HOSPITAL_COMMUNITY): Payer: Medicare Other

## 2022-01-18 LAB — GLUCOSE, CAPILLARY
Glucose-Capillary: 154 mg/dL — ABNORMAL HIGH (ref 70–99)
Glucose-Capillary: 291 mg/dL — ABNORMAL HIGH (ref 70–99)
Glucose-Capillary: 328 mg/dL — ABNORMAL HIGH (ref 70–99)
Glucose-Capillary: 375 mg/dL — ABNORMAL HIGH (ref 70–99)
Glucose-Capillary: 423 mg/dL — ABNORMAL HIGH (ref 70–99)
Glucose-Capillary: 464 mg/dL — ABNORMAL HIGH (ref 70–99)
Glucose-Capillary: 528 mg/dL (ref 70–99)
Glucose-Capillary: 535 mg/dL (ref 70–99)

## 2022-01-18 LAB — RENAL FUNCTION PANEL
Albumin: 1.9 g/dL — ABNORMAL LOW (ref 3.5–5.0)
Anion gap: 10 (ref 5–15)
BUN: 107 mg/dL — ABNORMAL HIGH (ref 6–20)
CO2: 28 mmol/L (ref 22–32)
Calcium: 8.2 mg/dL — ABNORMAL LOW (ref 8.9–10.3)
Chloride: 88 mmol/L — ABNORMAL LOW (ref 98–111)
Creatinine, Ser: 2.41 mg/dL — ABNORMAL HIGH (ref 0.61–1.24)
GFR, Estimated: 31 mL/min — ABNORMAL LOW (ref >60)
Glucose, Bld: 339 mg/dL — ABNORMAL HIGH (ref 70–99)
Phosphorus: 3.4 mg/dL (ref 2.5–4.6)
Potassium: 5.3 mmol/L — ABNORMAL HIGH (ref 3.5–5.1)
Sodium: 126 mmol/L — ABNORMAL LOW (ref 135–145)

## 2022-01-18 LAB — BASIC METABOLIC PANEL
Anion gap: 7 (ref 5–15)
BUN: 103 mg/dL — ABNORMAL HIGH (ref 6–20)
CO2: 29 mmol/L (ref 22–32)
Calcium: 8 mg/dL — ABNORMAL LOW (ref 8.9–10.3)
Chloride: 89 mmol/L — ABNORMAL LOW (ref 98–111)
Creatinine, Ser: 2.37 mg/dL — ABNORMAL HIGH (ref 0.61–1.24)
GFR, Estimated: 32 mL/min — ABNORMAL LOW (ref >60)
Glucose, Bld: 388 mg/dL — ABNORMAL HIGH (ref 70–99)
Potassium: 4.9 mmol/L (ref 3.5–5.1)
Sodium: 125 mmol/L — ABNORMAL LOW (ref 135–145)

## 2022-01-18 LAB — CBC
HCT: 24.6 % — ABNORMAL LOW (ref 39.0–52.0)
Hemoglobin: 8.5 g/dL — ABNORMAL LOW (ref 13.0–17.0)
MCH: 29.7 pg (ref 26.0–34.0)
MCHC: 34.6 g/dL (ref 30.0–36.0)
MCV: 86 fL (ref 80.0–100.0)
Platelets: 286 10*3/uL (ref 150–400)
RBC: 2.86 MIL/uL — ABNORMAL LOW (ref 4.22–5.81)
RDW: 16.8 % — ABNORMAL HIGH (ref 11.5–15.5)
WBC: 9.3 10*3/uL (ref 4.0–10.5)
nRBC: 0 % (ref 0.0–0.2)

## 2022-01-18 LAB — TYPE AND SCREEN
ABO/RH(D): O POS
Antibody Screen: NEGATIVE
Unit division: 0

## 2022-01-18 LAB — BPAM RBC
Blood Product Expiration Date: 202306152359
ISSUE DATE / TIME: 202306081341
Unit Type and Rh: 9500

## 2022-01-18 MED ORDER — INSULIN ASPART 100 UNIT/ML IJ SOLN
9.0000 [IU] | Freq: Once | INTRAMUSCULAR | Status: AC
  Administered 2022-01-18: 9 [IU] via SUBCUTANEOUS

## 2022-01-18 MED ORDER — INSULIN GLARGINE-YFGN 100 UNIT/ML ~~LOC~~ SOLN
20.0000 [IU] | Freq: Every day | SUBCUTANEOUS | Status: DC
  Filled 2022-01-18: qty 0.2

## 2022-01-18 MED ORDER — INSULIN ASPART 100 UNIT/ML IJ SOLN
5.0000 [IU] | Freq: Once | INTRAMUSCULAR | Status: AC
  Administered 2022-01-18: 5 [IU] via SUBCUTANEOUS

## 2022-01-18 MED ORDER — INSULIN ASPART 100 UNIT/ML IJ SOLN
3.0000 [IU] | Freq: Four times a day (QID) | INTRAMUSCULAR | Status: DC
Start: 2022-01-18 — End: 2022-01-21
  Administered 2022-01-18 – 2022-01-21 (×11): 3 [IU] via SUBCUTANEOUS

## 2022-01-18 MED ORDER — INSULIN GLARGINE-YFGN 100 UNIT/ML ~~LOC~~ SOLN
25.0000 [IU] | Freq: Every day | SUBCUTANEOUS | Status: DC
  Administered 2022-01-18 – 2022-01-20 (×3): 25 [IU] via SUBCUTANEOUS
  Filled 2022-01-18 (×4): qty 0.25

## 2022-01-18 MED ORDER — INSULIN ASPART 100 UNIT/ML IJ SOLN
5.0000 [IU] | Freq: Four times a day (QID) | INTRAMUSCULAR | Status: DC
  Administered 2022-01-18: 5 [IU] via SUBCUTANEOUS

## 2022-01-18 NOTE — Discharge Summary (Incomplete)
Name: Arthur Burke MRN: [id number] DOB: [birth year] 56 y.o. PCP: Pcp, No  Date of Admission: 01/13/2022  4:07 PM Date of Discharge: *** Attending Physician: Dr. Daryll Drown  Discharge Diagnosis: Principal Problem:   Hyponatremia    Discharge Medications: Allergies as of 01/18/2022       Reactions   Zolpidem Other (See Comments)   Other Reaction: CNS Disorder     Med Rec must be completed prior to using this [place]***       Disposition and follow-up:   [name] was discharged from [facility] in Stable condition.  At the hospital follow up visit please address:  1.  Follow-up:  a. Hyponatremia- from imbalance of tube feeds vs free water   b. Iron deficiency anemia- hgb dropped to 7 with lab draws. No signs of bleeding. He was given 1 unit PRBCs. Will start iron suspension through tube. Unsure if he has had coloscopy in past. Please discuss risks vs benefits of colonoscopy with him.   c. Urinary retention-    d. AKI   e. Hyperglycemia  2.  Labs / imaging needed at time of follow-up: BMP,CBC  3.  Pending labs/ test needing follow-up: ***  4.  Medication Changes  Started:  Stopped:  Changed:  Abx -   End Date:  Follow-up Appointments:   Hospital Course by problem list: No notes on file   Discharge Subjective:   Discharge Exam:   BP (!) 158/68 (BP Location: Right Arm)   Pulse 66   Temp 98.8 F (37.1 C) (Oral)   Resp (!) 22   Ht 6' (1.829 m) Comment: patient reported  Wt 67.5 kg   SpO2 92%   BMI 20.19 kg/m  Constitutional: well-appearing *** sitting in ***, in no acute distress HENT: normocephalic atraumatic, mucous membranes moist Eyes: conjunctiva non-erythematous Neck: supple Cardiovascular: regular rate and rhythm, no m/r/g Pulmonary/Chest: normal work of breathing on room air, lungs clear to auscultation bilaterally Abdominal: soft, non-tender, non-distended MSK: normal bulk and tone Neurological: alert & oriented x 3,  5/5 strength in bilateral upper and lower extremities, normal gait Skin: warm and dry Psych: ***   Pertinent Labs, Studies, and Procedures:     Latest Ref Rng & Units 01/18/2022    3:56 AM 01/17/2022    8:42 PM 01/17/2022    8:47 AM  CBC  WBC 4.0 - 10.5 K/uL 9.3   10.5   Hemoglobin 13.0 - 17.0 g/dL 8.5  8.1  7.0   Hematocrit 39.0 - 52.0 % 24.6  24.4  21.1   Platelets 150 - 400 K/uL 286   292        Latest Ref Rng & Units 01/18/2022    3:56 AM 01/17/2022    4:12 AM 01/16/2022    5:05 AM  CMP  Glucose 70 - 99 mg/dL 388  386  145   BUN 6 - 20 mg/dL 103  89  76   Creatinine 0.61 - 1.24 mg/dL 2.37  1.96  1.85   Sodium 135 - 145 mmol/L 125  125  126   Potassium 3.5 - 5.1 mmol/L 4.9  4.8  4.4   Chloride 98 - 111 mmol/L 89  89  87   CO2 22 - 32 mmol/L '29  29  31   '$ Calcium 8.9 - 10.3 mg/dL 8.0  7.9  8.3     DG Chest 2 View  Result Date: 01/13/2022 CLINICAL DATA:  Edema in the lower extremity EXAM:  CHEST - 2 VIEW COMPARISON:  12/20/2021 FINDINGS: Transverse diameter of heart is slightly increased. There are no signs of pulmonary edema or focal pulmonary consolidation. Patient's chin is partly obscuring the apices. Left lateral CP angle is not included. As far as seen, there is no significant pleural effusion or pneumothorax. Right IJ chest port is seen with its tip at the junction of superior vena cava and right atrium. Old fracture is seen in the right clavicle. IMPRESSION: No active cardiopulmonary disease. Electronically Signed   By: Elmer Picker M.D.   On: 01/13/2022 17:27     Discharge Instructions:   Signed: Christiana Fuchs, DO 01/18/2022, 6:43 AM   Pager: [phone]

## 2022-01-18 NOTE — Plan of Care (Signed)
  Problem: Coping: Goal: Level of anxiety will decrease Outcome: Progressing   Problem: Pain Managment: Goal: General experience of comfort will improve Outcome: Progressing   Problem: Safety: Goal: Ability to remain free from injury will improve Outcome: Progressing   Problem: Skin Integrity: Goal: Risk for impaired skin integrity will decrease Outcome: Progressing   

## 2022-01-18 NOTE — Progress Notes (Signed)
Physical Therapy Treatment Patient Details Name: Arthur Burke MRN: [id number] DOB: 09/08/1965 Today's Date: 01/18/2022   History of Present Illness Pt is a 56 y/o male presenting on 6/4 with LE edema and throat swelling. Admitted with hyponatremia, AKI. 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    PT Comments    Pt received supine, motivated and eager for OOB mobility. Pt able to demonstrate all bed mobility with supervision and light assist for verbal direction toward EOB 2/2 vision deficits and transfers with min assist to guide hips for safety. Pt able to come to standing throughout session with min assist down to min guard with practice and demonstrate 5x STS with RW in <90 seconds. Pt with good tolerance for all standing LE therex for increased endurance and strength. Pt agreeable to time up in recliner at end of session. Current plan remains appropriate to address deficits and maximize functional independence and decrease caregiver burden. Pt continues to benefit from skilled PT services to progress toward functional mobility goals.    Recommendations for follow up therapy are one component of a multi-disciplinary discharge planning process, led by the attending physician.  Recommendations may be updated based on patient status, additional functional criteria and insurance authorization.  Follow Up Recommendations  Skilled nursing-short term rehab (<3 hours/day)     Assistance Recommended at Discharge Intermittent Supervision/Assistance  Patient can return home with the following A little help with walking and/or transfers;A little help with bathing/dressing/bathroom;Assistance with cooking/housework;Direct supervision/assist for medications management;Help with stairs or ramp for entrance;Assist for transportation   Equipment Recommendations   None recommended by PT    Recommendations for Other Services       Precautions / Restrictions Precautions Precautions: Fall Precaution Comments: Lt BKA, PEG Restrictions Weight Bearing Restrictions: No     Mobility  Bed Mobility Overal bed mobility: Modified Independent             General bed mobility comments: increased time and use of bedrails    Transfers Overall transfer level: Needs assistance Equipment used: Rolling walker (2 wheels) Transfers: Bed to chair/wheelchair/BSC, Sit to/from Stand Sit to Stand: Min assist, Min guard Stand pivot transfers: Min assist         General transfer comment: STS x10 throughout session, with min assist for steadying assist down to min guard  with practice, min assist to pivot to recliner 2/2 vision defecits    Ambulation/Gait               General Gait Details: prosthesis not in room to attempt ambulation   Stairs             Wheelchair Mobility    Modified Rankin (Stroke Patients Only)       Balance Overall balance assessment: Needs assistance Sitting-balance support: No upper extremity supported, Feet supported Sitting balance-Leahy Scale: Good     Standing balance support: Bilateral upper extremity supported, During functional activity Standing balance-Leahy Scale: Poor Standing balance comment: relies on BUE support                            Cognition Arousal/Alertness: Awake/alert Behavior During Therapy: WFL for tasks assessed/performed Overall Cognitive Status: No family/caregiver present to determine baseline cognitive functioning  Exercises Other Exercises Other Exercises: standing heel raises x10, mini squats x10, STS with RW x5 Other Exercises: cervical pro/retraction, extnesion/flexion x10 for all Other Exercises: attempting hopping in place, pt able to complete x2 (c/o weak LUE)    General Comments         Pertinent Vitals/Pain Pain Assessment Pain Assessment: Faces Faces Pain Scale: Hurts little more Pain Location: abdomen Pain Descriptors / Indicators:  (gas) Pain Intervention(s): Monitored during session, Repositioned    Home Living                          Prior Function            PT Goals (current goals can now be found in the care plan section) Acute Rehab PT Goals PT Goal Formulation: With patient Time For Goal Achievement: 01/28/22    Frequency    Min 3X/week      PT Plan      Co-evaluation              AM-PAC PT "6 Clicks" Mobility   Outcome Measure  Help needed turning from your back to your side while in a flat bed without using bedrails?: None Help needed moving from lying on your back to sitting on the side of a flat bed without using bedrails?: None Help needed moving to and from a bed to a chair (including a wheelchair)?: A Little Help needed standing up from a chair using your arms (e.g., wheelchair or bedside chair)?: A Lot Help needed to walk in hospital room?: A Lot Help needed climbing 3-5 steps with a railing? : Total 6 Click Score: 16    End of Session Equipment Utilized During Treatment: Gait belt Activity Tolerance: Patient tolerated treatment well Patient left: in chair;with call bell/phone within reach Nurse Communication: Mobility status PT Visit Diagnosis: Unsteadiness on feet (R26.81);Muscle weakness (generalized) (M62.81);Difficulty in walking, not elsewhere classified (R26.2)     Time: 1340-1405 PT Time Calculation (min) (ACUTE ONLY): 25 min  Charges:  $Therapeutic Exercise: 23-37 mins                    Richar Dunklee R. PTA Acute Rehabilitation Services Office: [place] 01/18/2022, 2:45 PM

## 2022-01-18 NOTE — Consult Note (Signed)
Reason for Consult: AKI/CKD stage IIIb Referring Physician: Daryll Drown, MD  Arthur Burke is an 56 y.o. 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  Pancreas transplant failed immediately), and CKD stage IIIb who presented to [facility] ED on 01/13/22 c/o worsening lower extremity edema, throat swelling, and increasing fatigue.  In the ED, VSS, + edema of lower extremities, labs notable for Na 126, Cl 87, BUN 72, Cr 1.94, Ca 8.8, Hgb 8.3, BNP 663.  He was admitted by the teaching service due to acute hyponatremia and volume overload.  He was given IV lasix 40 mg on 01/15/22 with improvement of his edema, however his Scr has slowly risen to 2.37 and sodium has remained low at 125.  We were consulted to further evaluate his AKI/CKD stage IIIb.  The trend in Scr is seen below.  He denies any N/V/D.  He did have urinary retention and required a foley catheter which was removed earlier today and he has not been able to urinate since. His Scr has ranged 1.6-2.2 for the past 2 years and recently with episode of AKI/CKD IIIb due to volume depletion (after starting XRT).  Trend in Creatinine: Creatinine, Ser  Date/Time Value Ref Range Status  01/18/2022 03:56 AM 2.37 (H) 0.61 - 1.24 mg/dL Final  01/17/2022 04:12 AM 1.96 (H) 0.61 - 1.24 mg/dL Final  01/16/2022 05:05 AM 1.85 (H) 0.61 - 1.24 mg/dL Final  01/15/2022 03:16 AM 1.85 (H) 0.61 - 1.24 mg/dL Final  01/14/2022 03:59 AM 1.93 (H) 0.61 - 1.24 mg/dL Final  01/13/2022 04:30 PM 1.94 (H) 0.61 - 1.24 mg/dL Final  12/10/2021 03:07 PM 2.42 (H) 0.61 - 1.24 mg/dL Final  12/25/2021 04:10 AM 1.36 (H) 0.61 - 1.24 mg/dL Final  12/24/2021 04:26 AM 1.45 (H) 0.61 - 1.24 mg/dL Final  12/23/2021 09:00 AM 1.70 (H) 0.61 - 1.24 mg/dL Final  12/22/2021 04:02 AM 2.19 (H) 0.61 - 1.24  mg/dL Final  12/21/2021 10:28 PM 2.17 (H) 0.61 - 1.24 mg/dL Final  12/21/2021 09:01 PM 2.33 (H) 0.61 - 1.24 mg/dL Final  12/21/2021 05:37 AM 2.07 (H) 0.61 - 1.24 mg/dL Final  12/20/2021 07:29 PM 2.37 (H) 0.61 - 1.24 mg/dL Final  12/16/2021 12:35 PM 2.33 (H) 0.61 - 1.24 mg/dL Final  12/07/2021 03:27 PM 2.55 (H) 0.61 - 1.24 mg/dL Final  12/03/2021 02:25 PM 2.26 (H) 0.76 - 1.27 mg/dL Final  11/26/2021 11:56 AM 1.95 (H) 0.61 - 1.24 mg/dL Final  11/14/2021 11:42 AM 1.66 (H) 0.61 - 1.24 mg/dL Final  11/07/2021 02:52 PM 1.46 (H) 0.76 - 1.27 mg/dL Final  11/01/2021 03:48 PM 1.47 (H) 0.76 - 1.27 mg/dL Final  10/30/2021 05:00 AM 1.59 (H) 0.61 - 1.24 mg/dL Final  10/29/2021 04:08 AM 1.46 (H) 0.61 - 1.24 mg/dL Final  10/28/2021 02:01 AM 1.64 (H) 0.61 - 1.24 mg/dL Final  10/27/2021 04:00 AM 1.52 (H) 0.61 - 1.24 mg/dL Final  10/26/2021 04:12 AM 1.71 (H) 0.61 - 1.24 mg/dL Final  10/25/2021 02:31 AM 1.94 (H) 0.61 - 1.24 mg/dL Final  10/24/2021 06:10 AM 1.91 (H) 0.61 - 1.24 mg/dL Final  10/23/2021 06:04 AM 2.55 (H) 0.61 - 1.24 mg/dL Final  10/22/2021 06:16 PM 3.11 (H) 0.61 - 1.24 mg/dL Final  10/22/2021 10:36 AM 2.97 (H) 0.61 - 1.24 mg/dL Final  10/22/2021 04:14 AM 2.74 (H) 0.61 - 1.24 mg/dL Final  10/21/2021 09:03 PM 2.50 (H) 0.61 - 1.24 mg/dL Final  10/21/2021 09:29 AM 2.61 (H) 0.61 - 1.24 mg/dL Final  10/21/2021 04:20 AM 2.72 (H) 0.61 - 1.24 mg/dL Final  08/29/2017 07:17 AM 1.22 0.61 - 1.24 mg/dL Final  08/27/2017 05:58 AM 1.26 (H) 0.61 - 1.24 mg/dL Final  08/26/2017 06:46 AM 1.17 0.61 - 1.24 mg/dL Final  08/25/2017 06:28 AM 0.77 0.61 - 1.24 mg/dL Final  08/24/2017 07:35 AM 0.77 0.61 - 1.24 mg/dL Final  08/23/2017 05:48 AM 1.07 0.61 - 1.24 mg/dL Final  08/22/2017 04:52 AM 1.29 (H) 0.61 - 1.24 mg/dL Final  08/21/2017 04:57 AM 1.26 (H) 0.61 - 1.24 mg/dL Final    PMH:   Past Medical History:  Diagnosis Date   Acute osteomyelitis of left calcaneus ([place])    Cancer ([place])    Diabetes mellitus  without complication ([place])    Diabetic foot infection ([place])    Hypertension    Infected ulcer of skin, with fat layer exposed ([place])    Lower limb ulcer, ankle, left, with necrosis of muscle ([place])    MRSA (methicillin resistant staph aureus) culture positive    Renal disorder    Vision impairment     PSH:   Past Surgical History:  Procedure Laterality Date   AMPUTATION Left 08/23/2017   Procedure: AMPUTATION BELOW KNEE;  Surgeon: Newt Minion, MD;  Location: [facility];  Service: Orthopedics;  Laterality: Left;   APPLICATION OF WOUND VAC Left 08/23/2017   Procedure: APPLICATION OF WOUND VAC;  Surgeon: Newt Minion, MD;  Location: [facility];  Service: Orthopedics;  Laterality: Left;   IR GASTROSTOMY TUBE MOD SED  10/22/2021   IR IMAGING GUIDED PORT INSERTION  10/22/2021   KIDNEY TRANSPLANT  2006   [facility]   SKIN GRAFT     L diabetic foot wound with debriedment and skin grafting   TOE AMPUTATION     R great and 2nd toe    Allergies:  Allergies  Allergen Reactions   Zolpidem Other (See Comments)    Other Reaction: CNS Disorder    Medications:   Prior to Admission medications   Medication Sig Start Date End Date Taking? Authorizing Provider  amLODipine (NORVASC) 10 MG tablet Place 1 tablet (10 mg total) into feeding tube daily. Patient taking differently: Place 10 mg into feeding tube every morning. 11/01/21  Yes Orvis Brill, MD  atorvastatin (LIPITOR) 40 MG tablet Place 1 tablet (40 mg total) into feeding tube every evening. 10/30/21  Yes Demaio, Alexa, MD  carvedilol (COREG) 25 MG tablet Place 1 tablet (25 mg total) into feeding tube 2 (two) times daily. 10/30/21  Yes Demaio, Alexa, MD  cloNIDine (CATAPRES) 0.1 MG tablet Place 1 tablet (0.1 mg total) into feeding tube 2 (two) times daily. 10/30/21  Yes Demaio, Alexa, MD  Docusate Calcium (STOOL SOFTENER PO) Place 1 capsule into feeding tube every morning.   Yes [provider]  HUMALOG KWIKPEN 100 UNIT/ML KwikPen Inject 0-9  Units into the skin 3 (three) times daily with meals. Correction coverage: Sensitive (thin, NPO, renal) CBG < 70: Implement Hypoglycemia protocol, CBG 70 - 120: 0 units CBG 121 - 150: 1 unit CBG 151 - 200: 2 units CBG 201 - 250: 3 units CBG 251 - 300: 5 units CBG 301 - 350: 7 units CBG 351 - 400: 9 units CBG > 400: call MD. 12/25/21  Yes Modena Jansky, MD  insulin glargine (LANTUS) 100 unit/mL SOPN Inject 22 Units into the skin daily. Patient taking differently: Inject 20 Units into the skin at bedtime. 12/26/21  Yes Hongalgi, Lenis Dickinson, MD  lactulose (CHRONULAC) 10 GM/15ML solution Place 15 mLs (10 g total) into feeding tube daily as needed for moderate constipation. 12/25/21  Yes Hongalgi, Lenis Dickinson, MD  lidocaine (XYLOCAINE) 2 % solution Patient: Mix 1part 2% viscous lidocaine, 1part H20. Swish & swallow 82m of diluted mixture, 334m before meals and at bedtime, up to QID Patient taking differently: 5 mLs See admin instructions. Patient: Mix 1part 2% viscous lidocaine with 1part H20. Swish & swallow 5 ml of diluted mixture, 3066mbefore meals and at bedtime as needed for mouth pain 11/26/21  Yes SquEppie GibsonD  Multiple Vitamins-Minerals (VITRUM SENIOR) TABS Place 1 tablet into feeding tube daily. 10/30/21  Yes Demaio, Alexa, MD  mycophenolate (CELLCEPT) 200 MG/ML suspension Place 1.3 mLs (260 mg total) into feeding tube 2 (two) times daily. Patient taking differently: Place 500 mg into feeding tube 2 (two) times daily. 12/25/21  Yes Hongalgi, AnaLenis DickinsonD  Nutritional Supplements (FEEDING SUPPLEMENT, NEPRO CARB STEADY,) LIQD Place 237 mLs into feeding tube 5 (five) times daily. 12/25/21  Yes Hongalgi, AnaLenis DickinsonD  omeprazole (PRILOSEC) 40 MG capsule Take 1 capsule (40 mg total) by mouth daily. Per tube Patient taking differently: 40 mg every morning. Per tube 12/25/21  Yes Hongalgi, AnaLenis DickinsonD  ondansetron (ZOFRAN) 4 MG tablet Place 1 tablet (4 mg total) into feeding tube 2 (two) times daily as needed  for nausea or vomiting. 12/25/21  Yes Hongalgi, AnaLenis DickinsonD  Oxycodone HCl 20 MG TABS Place 20 mg into feeding tube every 4 (four) hours. 01/09/22  Yes [provider]  polyethylene glycol (MIRALAX / GLYCOLAX) 17 g packet Place 17 g into feeding tube daily as needed for mild constipation. 10/30/21  Yes Demaio, Alexa, MD  predniSONE (DELTASONE) 5 MG tablet Place 5 mg into feeding tube every morning. 12/16/21  Yes [provider]  tacrolimus (PROGRAF) 1 MG capsule 1 mg 2 (two) times daily. Per tube   Yes [provider]  tamsulosin (FLOMAX) 0.4 MG CAPS capsule Take 1 capsule (0.4 mg total) by mouth daily. Patient taking differently: 0.4 mg every evening. Per tube 12/26/21  Yes Hongalgi, AnaLenis DickinsonD  vitamin B-12 (CYANOCOBALAMIN) 500 MCG tablet Place 1 tablet (500 mcg total) into feeding tube daily. 10/30/21  Yes Demaio, Alexa, MD  Water For Irrigation, Sterile (FREE WATER) SOLN Place 60 mLs into feeding tube 5 (five) times daily. 12/25/21  Yes Hongalgi, AnaLenis DickinsonD  Continuous Blood Gluc Receiver (DEXDunnellonEVGoodhueProduct by Does not apply route once a week. 11/01/21   BonOrvis BrillD  Continuous Blood Gluc Sensor (DEXCOM G7 SENSOR) MISC 1 Product by Does not apply route once a week. Check every 10 days 11/01/21   BonOrvis BrillD  omeprazole (PRILOSEC) 20 MG capsule 20 mg 2 (two) times daily. Per tube Patient not taking: Reported on 01/14/2022 12/26/21   [provider]  tacrolimus (PROGRAF) oral suspension 1 mg/mL mixture Take 1 mL (1 mg) by mouth 2 (two) times daily. Patient not taking: Reported on 12/20/2021 11/07/21   BonOrvis BrillD  Wound Dressings (PRUTECT) EMUL Apply 1 application. topically 2 (two) times daily as needed (Apply to redness/hyperpigmentation in neck from radiation at least 2x day. Call Dr. SquPearlie Oysterrse at [phone] you have questions).  12/25/21   Modena Jansky, MD    Inpatient medications:  amLODipine  10 mg Per  Tube Daily   amoxicillin-clavulanate  800 mg Per Tube Q12H   atorvastatin  40 mg Per Tube QPM   carvedilol  25 mg Per Tube BID   Chlorhexidine Gluconate Cloth  6 each Topical Daily   cloNIDine  0.1 mg Per Tube BID   enoxaparin (LOVENOX) injection  30 mg Subcutaneous Q24H   feeding supplement (OSMOLITE 1.5 CAL)  356 mL Per Tube QID   feeding supplement (PROSource TF)  45 mL Per Tube TID   ferrous sulfate  300 mg Per Tube Daily   insulin aspart  0-9 Units Subcutaneous QID   insulin aspart  3 Units Subcutaneous QID   insulin glargine-yfgn  25 Units Subcutaneous QHS   multivitamin  15 mL Per Tube Daily   mycophenolate  250 mg Oral BID   polyethylene glycol  17 g Oral Daily   predniSONE  5 mg Per Tube Daily   tacrolimus (PROGRAF) 1 mg  1 mg Per Tube BID   tamsulosin  0.4 mg Oral Daily   vitamin B-12  500 mcg Per Tube Daily    Discontinued Meds:   Medications Discontinued During This Encounter  Medication Reason   amLODipine (NORVASC) tablet 10 mg    oxyCODONE (Oxy IR/ROXICODONE) immediate release tablet 15 mg    oxyCODONE (Oxy IR/ROXICODONE) immediate release tablet 5 mg    oxyCODONE (Oxy IR/ROXICODONE) immediate release tablet 20 mg    oxyCODONE (Oxy IR/ROXICODONE) immediate release tablet 20 mg    oxyCODONE (Oxy IR/ROXICODONE) immediate release tablet 20 mg    insulin aspart (novoLOG) injection 0-6 Units    insulin glargine-yfgn (SEMGLEE) injection 20 Units    insulin glargine-yfgn (SEMGLEE) injection 22 Units    ferrous sulfate tablet 325 mg    ferrous sulfate (FER-IN-SOL) 75 (15 Fe) MG/ML solution 15 mg of iron Change in therapy   feeding supplement (NEPRO CARB STEADY) liquid 237 mL    feeding supplement (OSMOLITE 1.2 CAL) liquid 1,000 mL    ceFEPIme (MAXIPIME) 2 g in sodium chloride 0.9 % 100 mL IVPB    vancomycin (VANCOREADY) IVPB 1250 mg/250 mL    vancomycin (VANCOREADY) IVPB 1250 mg/250 mL    insulin aspart (novoLOG) injection 0-9 Units    Ampicillin-Sulbactam (UNASYN) 3  g in sodium chloride 0.9 % 100 mL IVPB    amoxicillin-clavulanate (AUGMENTIN) 875-125 MG per tablet 1 tablet    insulin glargine-yfgn (SEMGLEE) injection 18 Units    insulin aspart (novoLOG) injection 3 Units    insulin glargine-yfgn (SEMGLEE) injection 20 Units    insulin aspart (novoLOG) injection 5 Units     Social History:  reports that he has quit smoking. His smoking use included cigarettes. He has never used smokeless tobacco. He reports that he does not drink alcohol and does not use drugs.  Family History:  History reviewed. No pertinent family history.  Pertinent items are noted in HPI. Weight change:   Intake/Output Summary (Last 24 hours) at 01/18/2022 1510 Last data filed at 01/18/2022 0002 Gross per 24 hour  Intake 345 ml  Output 800 ml  Net -455 ml   BP (!) 161/83 (BP Location: Right Arm)   Pulse 65   Temp 98.2 F (36.8 C) (Oral)   Resp 16   Ht 6' (1.829 m) Comment: patient reported  Wt 67.5 kg   SpO2 96%   BMI 20.19 kg/m  Vitals:   01/17/22  1548 01/17/22 1952 01/18/22 0452 01/18/22 0752  BP: (!) 148/80 (!) 156/66 (!) 158/68 (!) 161/83  Pulse:  72 66 65  Resp:  17 (!) 22 16  Temp: 99 F (37.2 C) 98.8 F (37.1 C)  98.2 F (36.8 C)  TempSrc: Oral Oral  Oral  SpO2:  95% 92% 96%  Weight:      Height:         General appearance: no distress and chronically ill-appearing Head: atraumatic Neck: swollen neck below jaw line with some induration and tenderness Resp: clear to auscultation bilaterally Cardio: regular rate and rhythm, S1, S2 normal, no murmur, click, rub or gallop GI: soft, non-tender; bowel sounds normal; no masses,  no organomegaly and renal allograft in LLQ, nontender, no bruits Extremities: edema s/p left BKA and trace presacral edema, no lower extremity edema  Labs: Basic Metabolic Panel: Recent Labs  Lab 01/13/22 1630 01/14/22 0359 01/15/22 0316 01/16/22 0505 01/17/22 0412 01/18/22 0356  NA 126* 126* 128* 126* 125* 125*  K 4.1 4.2  4.2 4.4 4.8 4.9  CL 87* 89* 93* 87* 89* 89*  CO2 '29 28 30 31 29 29  '$ GLUCOSE 278* 299* 140* 145* 386* 388*  BUN 72* 74* 72* 76* 89* 103*  CREATININE 1.94* 1.93* 1.85* 1.85* 1.96* 2.37*  ALBUMIN 2.4*  --   --   --   --   --   CALCIUM 8.8* 8.6* 8.6* 8.3* 7.9* 8.0*   Liver Function Tests: Recent Labs  Lab 01/13/22 1630  AST 16  ALT 11  ALKPHOS 125  BILITOT 0.7  PROT 6.3*  ALBUMIN 2.4*   No results for input(s): "LIPASE", "AMYLASE" in the last 168 hours. No results for input(s): "AMMONIA" in the last 168 hours. CBC: Recent Labs  Lab 01/15/22 0316 01/16/22 0505 01/17/22 0847 01/17/22 2042 01/18/22 0356  WBC 10.1 15.9*  16.0* 10.5  --  9.3  NEUTROABS  --  12.1*  --   --   --   HGB 8.0* 8.1*  7.9* 7.0* 8.1* 8.5*  HCT 23.8* 24.0*  23.8* 21.1* 24.4* 24.6*  MCV 86.2 87.0  85.9 87.6  --  86.0  PLT 372 405*  367 292  --  286   PT/INR: '@LABRCNTIP'$ (inr:5) Cardiac Enzymes: )No results for input(s): "CKTOTAL", "CKMB", "CKMBINDEX", "TROPONINI" in the last 168 hours. CBG: Recent Labs  Lab 01/17/22 1945 01/18/22 0015 01/18/22 0449 01/18/22 0722 01/18/22 1137  GLUCAP 364* 535* 375* 291* 154*    Iron Studies: No results for input(s): "IRON", "TIBC", "TRANSFERRIN", "FERRITIN" in the last 168 hours.  Xrays/Other Studies: No results found.   Assessment/Plan:  AKI/CKD stage IIIb - probably pre-renal due to severe hypoalbuminemia and 3rd spacing (UNa 21).  Also component of urinary retention.  Not much off from his baseline.  Will recheck urine studies and transplanted renal US to r/o hydronephrosis.  BUN elevated out of proportion so may need IVF's. S/p kidney transplantation- continue with tacrolimus, cellcept, and prednisone.  Agree with checking prograf level before next scheduled dose, however it will take several days to return. Hyponatremia - corrected sodium 130.  Will recheck urine studies now that he has not had diuretic for 3 days. Urinary retention - foley catheter  removed today.  On flomax and undergoing voiding trial today with intermittent I&O catheter as needed for bladder scan results.  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  Scr has ranged 1.6-2.2 over the past 3 years and likely having some  progressive CKD. Anemia of CKD stage IIIb - hold off on ESA due to [facility] of throat.  Transfuse as needed. 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  S/p PEG.  Will need some free water boluses when Na improves. Aspiration pneumonia - seen on CXR - currently on Augmentin. Type 1 DM - per primary svc HTN - stalbe   Governor Rooks Galo Sayed 01/18/2022, 3:10 PM

## 2022-01-18 NOTE — TOC Progression Note (Addendum)
Transition of Care [facility]) - Progression Note    Patient Details  Name: Arthur Burke MRN: [id number] Date of Birth: Nov 03, 1965  Transition of [facility]) CM/SW Contact  Joanne Chars, LCSW Phone Number: 01/18/2022, 10:10 AM  Clinical Narrative:   CSW informed by MD that pt cannot DC today.  CSW LM with [facility] to inform her and to ask if they can accept pt over the weekend.    1020: TC [facility].  They cannot accept pt over the weekend, will plan for admission Monday.   Expected Discharge Plan: [place] Barriers to Discharge: Continued Medical Work up, SNF Pending bed offer  Expected Discharge Plan and Services Expected Discharge Plan: [place] In-house Referral: Clinical Social Work   Post Acute Care Choice: [place] Living arrangements for the past 2 months: Apartment                                       Social Determinants of Health (SDOH) Interventions    Readmission Risk Interventions     No data to display

## 2022-01-18 NOTE — Plan of Care (Signed)
  Problem: Coping: Goal: Level of anxiety will decrease Outcome: Progressing   Problem: Pain Managment: Goal: General experience of comfort will improve Outcome: Progressing   Problem: Safety: Goal: Ability to remain free from injury will improve Outcome: Progressing   Problem: Skin Integrity: Goal: Risk for impaired skin integrity will decrease Outcome: Progressing   Problem: Urinary Elimination: Goal: Signs and symptoms of infection will decrease Outcome: Progressing

## 2022-01-18 NOTE — Hospital Course (Signed)
Non-oliguric AKI ESRD s/p renal tranplant Patient has had decreasing urine output since starting radiation therapy. His renal function has been monitored closely as it has been worsening lately. Foley catheter was placed 6/08 due to urinary retention. Voiding trial was failed and foley placed again  6/10. Creatinine acutely worsened to greater than 2.3. I talked with Dr. Tally Due ([place] transplant nephrology) and unlikely that acute worsening due to rejection. Renal ultrasound obtained and showed hydronephrosis. Thinking that AKI multifactorial with pre-renal (intravascularly depleted from hypoalbuminemia) and obstruction. Tacrolimus level was checked and is pending. He will need to follow-up with [facility] nephrology and oncology to clarify if immunosuppression dosing was adjusted with him having active cancer.   Urinary retention Foley catheter was placed 6/08 due to urinary retention. Home medication of flomax was continued during admission. He did not pass voiding trial and renal US of transplanted kidney showed mild hydronephrosis. Foley catheter was replaced 6/10. He will need to follow-up with urology.  T1DM Blood sugar uncontrolled into 300s to 500s with increasing tube feeds. He is receiving tube feeds 4 times daily. Regimen at discharge included long acting insulin increased to Glargine 30 units nightly, Humalog 3 units QID with tube feeds with sliding scale.   Iron deficiency anemia Acute on chronic anemia Iron studies consistent with iron deficency. Hgb dropped to 7. No signs of bleeding and I think drop was likely from lab draws. He was given 1 unit of RBCs and started on iron suspension solution. Unsure of last colonoscopy and would recommend making shared decision about indications for colonoscopy with iron deficiency anemia with his other comorbidities.  Acute on chronic lower extremity edema Malnutrition G-tube dependent Patient came in with acute on chronic lower extremity swelling in the  setting of hyponatremia and low albumin. Swelling improved with diuresis. Registered dietician assisted in making recommendations for tube feeds. He was tolerating tube feeds at goal prior to discharge.   Moderate Hyponatremia Sodium on admission 126, corrects to 133. Urine studies consistent with hypertonic hyponatremia from third spacing 2/2 to hypoalbuminemia. He was given lasix, albumin and nutrition was maximized with tube feeds.   Aspiration pneumonia He mounted a leukocytosis and elevated temperature. In setting of being immunocompromised, CXR and blood culture were obtained. CXR showed small left pleural effusion with new overlying basilar airspace opacities. He completed Augmentin while inpatient.   Squamous cell carcinoma s/p radiation treament Neck swelling SLP evaluated and recommended patient have full liquid and eat as able.   Deconditioning PT/ OT recommended SNF to help with ADLs.  HTN Continue home coreg 25, clonidine 0.1, and and amlodipine 10.   Chronic pain Continued home oxycodone 20 Q4 PRN  GERD Continue home omeprazole   HLD Continue home atorvastatin

## 2022-01-18 NOTE — Progress Notes (Addendum)
HD#4 Subjective:  Overnight Events: Glucose in 300s.  Patient states that he has cramping in his upper extremities. His breathing is comfortable and lower extremities feel less swollen. We talked about worsening of Kidney function and he wanted his nephrology at [facility] to be let know.   Objective:  Vital signs in last 24 hours: Vitals:   01/17/22 1548 01/17/22 1952 01/18/22 0452 01/18/22 0752  BP: (!) 148/80 (!) 156/66 (!) 158/68 (!) 161/83  Pulse:  72 66 65  Resp:  17 (!) 22 16  Temp: 99 F (37.2 C) 98.8 F (37.1 C)  98.2 F (36.8 C)  TempSrc: Oral Oral  Oral  SpO2:  95% 92% 96%  Weight:      Height:       Supplemental O2: Room Air SpO2: 96 %   Physical Exam:  Constitutional: alert, chronically ill appearing, NAD Neck: post-radiation changes to neck with erythema Cardiovascular: regular rate and rhythm, no m/r/g Pulmonary/Chest: normal work of breathing on room air, lungs clear to auscultation bilaterally Abdominal: soft, non-tender, non-distended MSK: left BKA, no edema  Neurological: oriented x4 Skin: warm and dry Psych: flat affect  Filed Weights   01/15/22 0800 01/16/22 0500 01/16/22 0900  Weight: 69.2 kg 71.3 kg 67.5 kg     Intake/Output Summary (Last 24 hours) at 01/18/2022 1316 Last data filed at 01/18/2022 0002 Gross per 24 hour  Intake 345 ml  Output 800 ml  Net -455 ml    Net IO Since Admission: -3,951 mL [01/18/22 1316]  Pertinent Labs:    Latest Ref Rng & Units 01/18/2022    3:56 AM 01/17/2022    8:42 PM 01/17/2022    8:47 AM  CBC  WBC 4.0 - 10.5 K/uL 9.3   10.5   Hemoglobin 13.0 - 17.0 g/dL 8.5  8.1  7.0   Hematocrit 39.0 - 52.0 % 24.6  24.4  21.1   Platelets 150 - 400 K/uL 286   292        Latest Ref Rng & Units 01/18/2022    3:56 AM 01/17/2022    4:12 AM 01/16/2022    5:05 AM  CMP  Glucose 70 - 99 mg/dL 388  386  145   BUN 6 - 20 mg/dL 103  89  76   Creatinine 0.61 - 1.24 mg/dL 2.37  1.96  1.85   Sodium 135 - 145 mmol/L 125  125  126    Potassium 3.5 - 5.1 mmol/L 4.9  4.8  4.4   Chloride 98 - 111 mmol/L 89  89  87   CO2 22 - 32 mmol/L '29  29  31   '$ Calcium 8.9 - 10.3 mg/dL 8.0  7.9  8.3     Imaging: No results found.  Assessment/Plan:   Principal Problem:   Hyponatremia   Patient Summary: Arthur Burke is a 56 y.o. 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  Creatinine function worsening today.   AKI on CKD stage 3a ESRD status postrenal transplant Urinary retention Cr from 1.96-2.37. Foley was removed last night and bladder scan shows 200cc of urine.  I called and spoke with Dr. Tally Due nephrologist  at [facility]. Baseline creatinine thought to be around 1.5-1.7. He states that since patient has been adherent with immunosuppressive therapy that it would be highly unlikely that creatinine worsening is from rejection. It is possible that AKI could be from tacrolimus. Patient has been on fluid restriction with hyponatremia and he could have a pre-renal aki. Will get trough level of tacrolimus this evening. Will also get ultrasound of transplanted kidney. -renal ultrasound -Avoid nephrotoxic medications -Daily BMP -Foley placed 6/6, voiding trial 6/8  -Continue prednisone, tacrolimus, and mycophenolate -Continue Flomax -UA -Nephrology consulted  Acute on chronic hypertonic hyponatremia Hypoalbuminemia Sodium corrected with glucoses at 130, stable.  Free water flushes discontinued.  Patient receiving tube feeds and protein modulators 3 times daily. -Continue tube feeds  -45 mL Prosource tube feeds 3 times daily -Full liquid diet p.o. for comfort per SLP -Trend  BMP  Acute on chronic anemia Iron deficiency anemia Hemoglobin at 8.5 this morning.  -Continue iron supplement soln per tube  Aspiration pneumonia Patient is immunosuppressed with prograf and low-dose prednisone for kidney transplant. CXR 6/7 showed small left pleural effusion with new overlying left basilar airspace opacities concerning for pneumonia.  Blood cultures show no growth to date.  -Follow blood cultures -Continue Augmentin for total abx course of 5 days (last dose on 6/11)  Acute on chronic lower extremity edema, resolved Malnutrition of moderate degree G-tube dependent No edema on exam today. Appreciate registered dietitian recommendations.  Will begin to titrate tube feeds up as tolerated. He is at goal for tube feeds with 1.5 cartons. -Dietitian recommendations appreciated   Squamous cell carcinoma s/p radiation treatment Neck swelling Patient reports difficulty swallowing.  Able to manage secretions and no signs of airway compromise. SLP evaluation recommended full liquid diet and ice cream for comfort, patient to rely on PEG for nutrition. Nephrologist at [facility] will follow-up with patient about titrating immunosuppressive medications with cancer at follow-up visit.   Deconditioning -PT/OT recommending SNF   Type 1 diabetes mellitus Patient takes 22 units of glargine nightly.  Blood glucose has been fluctuating, patient was not receiving short acting insulin with tube feeds yesterday.  Adjusted SSI to 4 times daily with an additional 3 units 4 times daily.  Will continue long-acting 18 units daily and titrate as needed -Glargine 25 units daily, increasing from 18 to 25 with increased tube feeds -SSI 4 times daily with tube feeds -Novolog 3 units QID   Hypertension Continue home coreg 25, clonidine 0.1, and and amlodipine 10.   Chronic pain Continued home oxycodone 20 Q4 PRN   GERD Continue home omeprazole.   HLD Continue home atorvastatin.  Diet: Tube  Feeds VTE: Enoxaparin Code: Full PT/OT recs: SNF for Subacute PT.   Dispo: Anticipated discharge to Skilled nursing facility on Monday to SNF.   Alexarae Oliva M. Aicha Clingenpeel, D.O.  Internal Medicine Resident, PGY-1 [facility]  Pager: [phone] 1:16 PM, 01/18/2022   **Please contact the on call pager after 5 pm and on weekends at [phone].**

## 2022-01-18 NOTE — Progress Notes (Signed)
Foley catheter discontinued for voiding trial.

## 2022-01-18 NOTE — Progress Notes (Signed)
Speech Language Pathology Treatment: Dysphagia  Patient Details Name: Arthur Burke MRN: [id number] DOB: 07/15/1966 Today's Date: 01/18/2022 Time: 1700-1711 SLP Time Calculation (min) (ACUTE ONLY): 11 min  Assessment / Plan / Recommendation Clinical Impression  Pt was seen for dysphagia treatment. He was alert and cooperative during the session. He stated that he has been able to have some items p.o., but that liquids with a lot of flavor burn his throat so he has been "sticking to water". Pt consistently demonstrated throat clearing after p.o. trials as he has been instructed to as a compensatory strategy. He tolerated puree and thin liquids via cup without coughing. Multiple swallows were noted with purees which aligns with his recent MBS which showed pharyngeal residue. Pt used a liquid wash which he stated improved his sensation of "something being there". Pt was reminded of the benefits of continuing to swallow consistencies which he can manage to reduce the severity of iatrogenic dysphagia. He verbalized understanding and agreement. SLP will continue to follow pt.     HPI HPI: 56 yo male admitted 6/4 with LE edema and throat swelling. Admitted with hyponatremia, AKI. 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 MBS 10/26/21 - reg/thin, 12/24/21 - thin liquids      SLP Plan  Continue with current plan of care      Recommendations for follow up therapy are one component of a multi-disciplinary discharge planning process, led by the attending physician.  Recommendations may be updated based on patient status, additional functional criteria and insurance authorization.    Recommendations  Diet recommendations: Thin liquid (continue full liquids) Medication Administration: Via alternative means Compensations: Slow rate;Small sips/bites;Clear throat after  each swallow;Follow solids with liquid                Oral Care Recommendations: Oral care QID Follow Up Recommendations: Skilled nursing-short term rehab (<3 hours/day) SLP Visit Diagnosis: Dysphagia, pharyngeal phase (R13.13) Plan: Continue with current plan of care         Townes Fuhs I. Hardin Negus, [place], [place] Office number [phone] Pager [place]  01/18/2022, 5:19 PM

## 2022-01-19 DIAGNOSIS — R339 Retention of urine, unspecified: Secondary | ICD-10-CM

## 2022-01-19 DIAGNOSIS — Z931 Gastrostomy status: Secondary | ICD-10-CM

## 2022-01-19 DIAGNOSIS — J690 Pneumonitis due to inhalation of food and vomit: Secondary | ICD-10-CM

## 2022-01-19 LAB — RENAL FUNCTION PANEL
Albumin: 1.9 g/dL — ABNORMAL LOW (ref 3.5–5.0)
Anion gap: 7 (ref 5–15)
BUN: 115 mg/dL — ABNORMAL HIGH (ref 6–20)
CO2: 27 mmol/L (ref 22–32)
Calcium: 8 mg/dL — ABNORMAL LOW (ref 8.9–10.3)
Chloride: 90 mmol/L — ABNORMAL LOW (ref 98–111)
Creatinine, Ser: 2.35 mg/dL — ABNORMAL HIGH (ref 0.61–1.24)
GFR, Estimated: 32 mL/min — ABNORMAL LOW (ref >60)
Glucose, Bld: 306 mg/dL — ABNORMAL HIGH (ref 70–99)
Phosphorus: 2.9 mg/dL (ref 2.5–4.6)
Potassium: 5 mmol/L (ref 3.5–5.1)
Sodium: 124 mmol/L — ABNORMAL LOW (ref 135–145)

## 2022-01-19 LAB — URINALYSIS, ROUTINE W REFLEX MICROSCOPIC
Bacteria, UA: NONE SEEN
Bilirubin Urine: NEGATIVE
Glucose, UA: 50 mg/dL — AB
Hgb urine dipstick: NEGATIVE
Ketones, ur: NEGATIVE mg/dL
Leukocytes,Ua: NEGATIVE
Nitrite: NEGATIVE
Protein, ur: 100 mg/dL — AB
Specific Gravity, Urine: 1.014 (ref 1.005–1.030)
pH: 5 (ref 5.0–8.0)

## 2022-01-19 LAB — CBC
HCT: 23.6 % — ABNORMAL LOW (ref 39.0–52.0)
Hemoglobin: 7.8 g/dL — ABNORMAL LOW (ref 13.0–17.0)
MCH: 28.6 pg (ref 26.0–34.0)
MCHC: 33.1 g/dL (ref 30.0–36.0)
MCV: 86.4 fL (ref 80.0–100.0)
Platelets: 280 10*3/uL (ref 150–400)
RBC: 2.73 MIL/uL — ABNORMAL LOW (ref 4.22–5.81)
RDW: 16.6 % — ABNORMAL HIGH (ref 11.5–15.5)
WBC: 9 10*3/uL (ref 4.0–10.5)
nRBC: 0 % (ref 0.0–0.2)

## 2022-01-19 LAB — GLUCOSE, CAPILLARY
Glucose-Capillary: 309 mg/dL — ABNORMAL HIGH (ref 70–99)
Glucose-Capillary: 225 mg/dL — ABNORMAL HIGH (ref 70–99)
Glucose-Capillary: 143 mg/dL — ABNORMAL HIGH (ref 70–99)
Glucose-Capillary: 294 mg/dL — ABNORMAL HIGH (ref 70–99)
Glucose-Capillary: 296 mg/dL — ABNORMAL HIGH (ref 70–99)

## 2022-01-19 LAB — CULTURE, RESPIRATORY W GRAM STAIN

## 2022-01-19 LAB — OSMOLALITY, URINE: Osmolality, Ur: 388 mOsm/kg (ref 300–900)

## 2022-01-19 LAB — SODIUM, URINE, RANDOM: Sodium, Ur: 11 mmol/L

## 2022-01-19 MED ORDER — ALBUMIN HUMAN 25 % IV SOLN
25.0000 g | Freq: Once | INTRAVENOUS | Status: AC
  Administered 2022-01-19: 12.5 g via INTRAVENOUS
  Filled 2022-01-19: qty 100

## 2022-01-19 NOTE — Progress Notes (Addendum)
NAME:  Arthur Burke, MRN:  [id number], DOB:  [birth year], LOS: 5 ADMISSION DATE:  01/13/2022  Subjective  Patient evaluated at bedside this AM. Reports he is doing well. Urinated some yesterday evening, has not today. Feels some pressure in lower abdomen, wants to try and void prior to re-inserting catheter.  Objective   Blood pressure (!) 151/64, pulse 66, temperature 98.6 F (37 C), temperature source Oral, resp. rate 15, height 6' (1.829 m), weight 67.5 kg, SpO2 98 %.     Intake/Output Summary (Last 24 hours) at 01/19/2022 1118 Last data filed at 01/18/2022 2232 Gross per 24 hour  Intake 10 ml  Output 400 ml  Net -390 ml   Filed Weights   01/15/22 0800 01/16/22 0500 01/16/22 0900  Weight: 69.2 kg 71.3 kg 67.5 kg   Physical Exam: General: Resting comfortably in no acute distress CV: Regular rate, rhythm. No murmurs appreciated. 2/6 systolic murmur appreciated. Pulm: Normal respiratory effort on room air. Clear to ausculation bilaterally.  Abdomen: Soft, non-tender, non-distended. Normoactive bowel sounds. Neuro: Awake, alert, conversing appropriately. Grossly non-focal Psych: Normal mood, affect, speech.  Labs       Latest Ref Rng & Units 01/19/2022    3:37 AM 01/18/2022    3:56 AM 01/17/2022    8:42 PM  CBC  WBC 4.0 - 10.5 K/uL 9.0  9.3    Hemoglobin 13.0 - 17.0 g/dL 7.8  8.5  8.1   Hematocrit 39.0 - 52.0 % 23.6  24.6  24.4   Platelets 150 - 400 K/uL 280  286        Latest Ref Rng & Units 01/19/2022    3:37 AM 01/18/2022    1:06 PM 01/18/2022    3:56 AM  BMP  Glucose 70 - 99 mg/dL 306  339  388   BUN 6 - 20 mg/dL 115  107  103   Creatinine 0.61 - 1.24 mg/dL 2.35  2.41  2.37   Sodium 135 - 145 mmol/L 124  126  125   Potassium 3.5 - 5.1 mmol/L 5.0  5.3  4.9   Chloride 98 - 111 mmol/L 90  88  89   CO2 22 - 32 mmol/L '27  28  29   '$ Calcium 8.9 - 10.3 mg/dL 8.0  8.2  8.0     Summary   Arthur Burke is 56yo person with ESRD s/p transplant of L kidney 2015 on immunosuppression,  diabetic nephropathy on chronic glucocorticoid therapy, type 1 diabetes mellitus, previous BKA 2/2 MRSA osteomyelitis, squamous cell carcinoma of throat s/p radiation, no gastrostomy tube depended admitted 6/4 with  acute on chronic hyponatremia, hospital course complicated by aspiration pneumonia and acute kidney injury.   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 Electrolytes normal. Yesterday reported urinating some, will continue voiding trial today. Renal ultrasound with mild L hydronephrosis. Still pending tacrolimus level. Urine studies have not been collected, discussed with nursing  this morning. If can urinate, will measure post-void residual this morning. If he has not urinated in the next few hours will place Foley.  - Follow-up urine studies - Pending post-void residual bladder scan - If unable to urinate, will place Foley this afternoon - Continue daily tamsulosin - Continue home prednisone, tacrolimus, and mycophenolate - Appreciate nephrology's assistance - Daily BMP - Tacrolimus level pending  #Chronic hyponatremia Corrected sodium this morning 127. Despite adequate therapy, he has remained at Na 125-130 throughout hospitalization - possibly new normal? Will continue with current regimen. - Prosource tube feeds three times daily - Full liquid diet for comfort per SLP - Daily BMP  #Anemia of  chronic disease #Iron deficiency anemia Mild Hgb drop, no signs of bleeding. - Continue iron supplements  #Aspiration pneumonia Stable from respiratory standpoint. Cultures no growth to date. Will continue with antibiotics through tomorrow 6/11. - Twice daily Augmentin, last day tomorrow 6/11  #Type 1 diabetes mellitus Sugars significantly elevated over last 24h 300-500's. Since increase in long-acting insulin, sugars have dropped to 200's later this morning. Will continue with this regimen today. - Glargine 25 units QHS - Novolog 3 units four times daily - SSI four times daily  #Moderate protein-calorie malnutrition  #Deconditioning in setting of multiple hospitalizations, chronic diseases Appreciate the assistance of RD, CSW, SLP. I believe we have a good plan moving forward with tube feeds and comfort PO feeds. Planning on SNF once medically stable.  Best Practice:   DIET: Full liquid IVF: n/a DVT PPX: lovenox BOWEL: lactulose CODE: FULL FAM COM: n/a  Arthur Dame, MD Internal Medicine Resident PGY-2 PAGER: [phone] 01/19/2022 11:18 AM  If after hours (below), please contact on-call pager: [phone] 5PM-7AM Monday-Friday 1PM-7AM Saturday-Sunday

## 2022-01-19 NOTE — Progress Notes (Signed)
Patient ID: Arthur Burke, male   DOB: 12-30-1965, 56 y.o.   MRN: [id number] S: No new complaints this morning but not urinating much since foley removed yesterday. O:BP (!) 146/59 (BP Location: Right Arm)   Pulse 66   Temp 98.6 F (37 C) (Oral)   Resp 20   Ht 6' (1.829 m) Comment: patient reported  Wt 67.5 kg   SpO2 91%   BMI 20.19 kg/m   Intake/Output Summary (Last 24 hours) at 01/19/2022 1235 Last data filed at 01/19/2022 1231 Gross per 24 hour  Intake 10 ml  Output 1300 ml  Net -1290 ml   Intake/Output: I/O last 3 completed shifts: In: 10 [I.V.:10] Out: 1200 [Urine:1200]  Intake/Output this shift:  Total I/O In: -  Out: 900 [Urine:900] Weight change:  Gen:NAD CVS: RRR Resp:CTA Abd: +BS, soft, NT/ND Ext: edema of LUE 1+, s/p LBKA  Recent Labs  Lab 01/13/22 1630 01/14/22 0359 01/15/22 0316 01/16/22 0505 01/17/22 0412 01/18/22 0356 01/18/22 1306 01/19/22 0337  NA 126* 126* 128* 126* 125* 125* 126* 124*  K 4.1 4.2 4.2 4.4 4.8 4.9 5.3* 5.0  CL 87* 89* 93* 87* 89* 89* 88* 90*  CO2 '29 28 30 31 29 29 28 27  '$ GLUCOSE 278* 299* 140* 145* 386* 388* 339* 306*  BUN 72* 74* 72* 76* 89* 103* 107* 115*  CREATININE 1.94* 1.93* 1.85* 1.85* 1.96* 2.37* 2.41* 2.35*  ALBUMIN 2.4*  --   --   --   --   --  1.9* 1.9*  CALCIUM 8.8* 8.6* 8.6* 8.3* 7.9* 8.0* 8.2* 8.0*  PHOS  --   --   --   --   --   --  3.4 2.9  AST 16  --   --   --   --   --   --   --   ALT 11  --   --   --   --   --   --   --    Liver Function Tests: Recent Labs  Lab 01/13/22 1630 01/18/22 1306 01/19/22 0337  AST 16  --   --   ALT 11  --   --   ALKPHOS 125  --   --   BILITOT 0.7  --   --   PROT 6.3*  --   --   ALBUMIN 2.4* 1.9* 1.9*   No results for input(s): "LIPASE", "AMYLASE" in the last 168 hours. No results for input(s): "AMMONIA" in the last 168 hours. CBC: Recent Labs  Lab 01/15/22 0316 01/16/22 0505 01/17/22 0847 01/17/22 2042 01/18/22 0356 01/19/22 0337  WBC 10.1 15.9*  16.0* 10.5  --   9.3 9.0  NEUTROABS  --  12.1*  --   --   --   --   HGB 8.0* 8.1*  7.9* 7.0* 8.1* 8.5* 7.8*  HCT 23.8* 24.0*  23.8* 21.1* 24.4* 24.6* 23.6*  MCV 86.2 87.0  85.9 87.6  --  86.0 86.4  PLT 372 405*  367 292  --  286 280   Cardiac Enzymes: No results for input(s): "CKTOTAL", "CKMB", "CKMBINDEX", "TROPONINI" in the last 168 hours. CBG: Recent Labs  Lab 01/18/22 2000 01/18/22 2323 01/19/22 0340 01/19/22 0734 01/19/22 1201  GLUCAP 528* 423* 309* 225* 143*    Iron Studies: No results for input(s): "IRON", "TIBC", "TRANSFERRIN", "FERRITIN" in the last 72 hours. Studies/Results: US Renal Transplant w/Doppler  Result Date: 01/18/2022 CLINICAL DATA:  Acute kidney injury, left renal transplant EXAM: ULTRASOUND  OF RENAL TRANSPLANT WITH RENAL DOPPLER ULTRASOUND TECHNIQUE: Ultrasound examination of the renal transplant was performed with gray-scale, color and duplex doppler evaluation. COMPARISON:  Ultrasound 10/23/2021 FINDINGS: Transplant kidney location: LLQ Transplant Kidney: Renal measurements: 13.1 x 6.6 x 7.7 cm = volume: 348.49m. There is mild hydronephrosis. Increased renal cortical echogenicity. Color flow in the main renal artery:  Yes Color flow in the main renal vein:  Yes Duplex Doppler Evaluation: Main Renal Artery Velocity: 74.5 cm/sec Main Renal Artery Resistive Index: 0.87 Venous waveform in main renal vein:  Present Intrarenal resistive index in upper pole:  0.88 (normal 0.6-0.8; equivocal 0.8-0.9; abnormal >= 0.9) Intrarenal resistive index in lower pole: 0.98 (normal 0.6-0.8; equivocal 0.8-0.9; abnormal >= 0.9) Bladder: Normal for degree of bladder distention. Other findings: Trace perinephric fluid without focal/drainable collection. IMPRESSION: Mild hydronephrosis of the left lower quadrant transplant kidney. Increased renal cortical echogenicity with elevated intrarenal resistive indices, slightly increased from prior exam in March. Electronically Signed   By: JMaurine SimmeringM.D.   On:  01/18/2022 17:23    amLODipine  10 mg Per Tube Daily   amoxicillin-clavulanate  800 mg Per Tube Q12H   atorvastatin  40 mg Per Tube QPM   carvedilol  25 mg Per Tube BID   Chlorhexidine Gluconate Cloth  6 each Topical Daily   cloNIDine  0.1 mg Per Tube BID   enoxaparin (LOVENOX) injection  30 mg Subcutaneous Q24H   feeding supplement (OSMOLITE 1.5 CAL)  356 mL Per Tube QID   feeding supplement (PROSource TF)  45 mL Per Tube TID   ferrous sulfate  300 mg Per Tube Daily   insulin aspart  0-9 Units Subcutaneous QID   insulin aspart  3 Units Subcutaneous QID   insulin glargine-yfgn  25 Units Subcutaneous QHS   multivitamin  15 mL Per Tube Daily   mycophenolate  250 mg Oral BID   polyethylene glycol  17 g Oral Daily   predniSONE  5 mg Per Tube Daily   tacrolimus (PROGRAF) 1 mg  1 mg Per Tube BID   tamsulosin  0.4 mg Oral Daily   vitamin B-12  500 mcg Per Tube Daily    BMET    Component Value Date/Time   NA 124 (L) 01/19/2022 0337   NA 128 (L) 12/03/2021 1425   NA 119 (LL) 06/19/2013 2156   K 5.0 01/19/2022 0337   K 3.9 06/19/2013 2156   CL 90 (L) 01/19/2022 0337   CL 86 (L) 06/19/2013 2156   CO2 27 01/19/2022 0337   CO2 30 06/19/2013 2156   GLUCOSE 306 (H) 01/19/2022 0337   GLUCOSE 69 06/19/2013 2156   BUN 115 (H) 01/19/2022 0337   BUN 76 (HH) 12/03/2021 1425   BUN 9 06/19/2013 2156   CREATININE 2.35 (H) 01/19/2022 0337   CREATININE 2.42 (H) 12/10/2021 1507   CREATININE 0.82 06/19/2013 2156   CALCIUM 8.0 (L) 01/19/2022 0337   CALCIUM 9.9 06/19/2013 2156   GFRNONAA 32 (L) 01/19/2022 0337   GFRNONAA 31 (L) 12/10/2021 1507   GFRNONAA >60 06/19/2013 2156   GFRAA >60 08/29/2017 0717   GFRAA >60 06/19/2013 2156   CBC    Component Value Date/Time   WBC 9.0 01/19/2022 0337   RBC 2.73 (L) 01/19/2022 0337   HGB 7.8 (L) 01/19/2022 0337   HGB 13.7 06/19/2013 2156   HCT 23.6 (L) 01/19/2022 0337   HCT 39.1 (L) 06/19/2013 2156   PLT 280 01/19/2022 0337   PLT 252 06/19/2013  2156   MCV 86.4 01/19/2022 0337   MCV 84 06/19/2013 2156   MCH 28.6 01/19/2022 0337   MCHC 33.1 01/19/2022 0337   RDW 16.6 (H) 01/19/2022 0337   RDW 14.1 06/19/2013 2156   LYMPHSABS 2.2 01/16/2022 0505   LYMPHSABS 4.8 (H) 04/21/2013 0117   MONOABS 1.5 (H) 01/16/2022 0505   MONOABS 2.3 (H) 04/21/2013 0117   EOSABS 0.1 01/16/2022 0505   EOSABS 0.0 04/21/2013 0117   BASOSABS 0.0 01/16/2022 0505   BASOSABS 0.1 04/21/2013 0117    Assessment/Plan:  AKI/CKD stage IIIb - probably pre-renal due to severe hypoalbuminemia and 3rd spacing (UNa 21).  Also component of urinary retention.  Not much off from his baseline.  Will recheck urine studies and transplanted renal US to r/o hydronephrosis.  BUN elevated out of proportion so may need IVF's.  Will try IV albumin to help with 3rd spacing. S/p kidney transplantation- continue with tacrolimus, cellcept, and prednisone.  Agree with checking prograf level before next scheduled dose, however it will take several days to return. Hyponatremia - corrected sodium 130.  Will recheck urine studies now that he has not had diuretic for 3 days. Urinary retention - foley catheter removed today.  On flomax and undergoing voiding trial today with intermittent I&O catheter as needed for bladder scan results.  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  Scr has ranged 1.6-2.2 over the past 3 years and likely having some progressive CKD. Anemia of CKD stage IIIb - hold off on ESA due to [facility] of throat.  Transfuse as needed. 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  S/p PEG.  Will need some free water boluses when Na improves. Aspiration pneumonia - seen on CXR - currently on Augmentin. Type 1 DM - per primary svc HTN - stalbe  Donetta Potts, MD [facility]

## 2022-01-19 NOTE — Plan of Care (Signed)

## 2022-01-20 DIAGNOSIS — E871 Hypo-osmolality and hyponatremia: Secondary | ICD-10-CM

## 2022-01-20 LAB — RENAL FUNCTION PANEL
Albumin: 2.1 g/dL — ABNORMAL LOW (ref 3.5–5.0)
Anion gap: 7 (ref 5–15)
BUN: 114 mg/dL — ABNORMAL HIGH (ref 6–20)
CO2: 29 mmol/L (ref 22–32)
Calcium: 8.4 mg/dL — ABNORMAL LOW (ref 8.9–10.3)
Chloride: 92 mmol/L — ABNORMAL LOW (ref 98–111)
Creatinine, Ser: 2.23 mg/dL — ABNORMAL HIGH (ref 0.61–1.24)
GFR, Estimated: 34 mL/min — ABNORMAL LOW (ref >60)
Glucose, Bld: 177 mg/dL — ABNORMAL HIGH (ref 70–99)
Phosphorus: 3 mg/dL (ref 2.5–4.6)
Potassium: 4.5 mmol/L (ref 3.5–5.1)
Sodium: 128 mmol/L — ABNORMAL LOW (ref 135–145)

## 2022-01-20 LAB — GLUCOSE, CAPILLARY
Glucose-Capillary: 126 mg/dL — ABNORMAL HIGH (ref 70–99)
Glucose-Capillary: 172 mg/dL — ABNORMAL HIGH (ref 70–99)
Glucose-Capillary: 233 mg/dL — ABNORMAL HIGH (ref 70–99)
Glucose-Capillary: 356 mg/dL — ABNORMAL HIGH (ref 70–99)
Glucose-Capillary: 417 mg/dL — ABNORMAL HIGH (ref 70–99)
Glucose-Capillary: 533 mg/dL (ref 70–99)
Glucose-Capillary: 76 mg/dL (ref 70–99)

## 2022-01-20 LAB — CBC
HCT: 23.7 % — ABNORMAL LOW (ref 39.0–52.0)
Hemoglobin: 7.9 g/dL — ABNORMAL LOW (ref 13.0–17.0)
MCH: 28.9 pg (ref 26.0–34.0)
MCHC: 33.3 g/dL (ref 30.0–36.0)
MCV: 86.8 fL (ref 80.0–100.0)
Platelets: 283 10*3/uL (ref 150–400)
RBC: 2.73 MIL/uL — ABNORMAL LOW (ref 4.22–5.81)
RDW: 16.6 % — ABNORMAL HIGH (ref 11.5–15.5)
WBC: 8.9 10*3/uL (ref 4.0–10.5)
nRBC: 0 % (ref 0.0–0.2)

## 2022-01-20 LAB — MAGNESIUM: Magnesium: 2.5 mg/dL — ABNORMAL HIGH (ref 1.7–2.4)

## 2022-01-20 MED ORDER — INSULIN ASPART 100 UNIT/ML IJ SOLN
9.0000 [IU] | Freq: Once | INTRAMUSCULAR | Status: AC
  Administered 2022-01-20: 9 [IU] via SUBCUTANEOUS

## 2022-01-20 MED ORDER — INSULIN ASPART 100 UNIT/ML IJ SOLN
0.0000 [IU] | Freq: Every day | INTRAMUSCULAR | Status: DC
  Administered 2022-01-20: 5 [IU] via SUBCUTANEOUS

## 2022-01-20 MED ORDER — INSULIN ASPART 100 UNIT/ML IJ SOLN
0.0000 [IU] | Freq: Three times a day (TID) | INTRAMUSCULAR | Status: DC
  Administered 2022-01-20: 3 [IU] via SUBCUTANEOUS
  Administered 2022-01-20: 2 [IU] via SUBCUTANEOUS
  Administered 2022-01-21: 3 [IU] via SUBCUTANEOUS
  Administered 2022-01-21: 2 [IU] via SUBCUTANEOUS

## 2022-01-20 MED ORDER — ALBUMIN HUMAN 25 % IV SOLN
25.0000 g | Freq: Once | INTRAVENOUS | Status: AC
  Administered 2022-01-20: 25 g via INTRAVENOUS
  Filled 2022-01-20: qty 100

## 2022-01-20 MED ORDER — OXYCODONE HCL 5 MG PO TABS
20.0000 mg | ORAL_TABLET | ORAL | Status: DC
  Administered 2022-01-20 – 2022-01-21 (×7): 20 mg
  Filled 2022-01-20 (×6): qty 4

## 2022-01-20 MED ORDER — OXYCODONE HCL 5 MG PO TABS
20.0000 mg | ORAL_TABLET | ORAL | Status: DC
  Filled 2022-01-20: qty 4

## 2022-01-20 MED ORDER — ENOXAPARIN SODIUM 40 MG/0.4ML IJ SOSY
40.0000 mg | PREFILLED_SYRINGE | INTRAMUSCULAR | Status: DC
  Administered 2022-01-20: 40 mg via SUBCUTANEOUS
  Filled 2022-01-20: qty 0.4

## 2022-01-20 NOTE — Progress Notes (Signed)
Got a call from [employer] pt had a 3 beat of V-Tach at 3.43AM. On call provider notified.

## 2022-01-20 NOTE — Progress Notes (Signed)
Patient ID: Arthur Burke, male   DOB: Apr 14, 1966, 56 y.o.   MRN: [id number] S:No complaints.  Foley catheter replaced.  Given IV albumin yesterday with improvement of sodium and Cr but BUN still elevated. O:BP (!) 158/66 (BP Location: Right Arm)   Pulse 63   Temp 98.6 F (37 C) (Oral)   Resp 13   Ht 6' (1.829 m) Comment: patient reported  Wt 67.5 kg   SpO2 98%   BMI 20.19 kg/m   Intake/Output Summary (Last 24 hours) at 01/20/2022 1145 Last data filed at 01/20/2022 1100 Gross per 24 hour  Intake 752.22 ml  Output 2651 ml  Net -1898.78 ml   Intake/Output: I/O last 3 completed shifts: In: 76 [Other:135; NG/GT:841; IV Piggyback:120] Out: 2551 [Urine:2550; Stool:1]  Intake/Output this shift:  Total I/O In: 87.2 [IV Piggyback:87.2] Out: 500 [Urine:500] Weight change:  Gen:NAD CVS: RRR Resp: CTA Abd: +BS, soft, NT/ND Ext: s/p LBKA, 1+ edema of right arm, trace presacral edema.  Recent Labs  Lab 01/13/22 1630 01/14/22 0359 01/15/22 0316 01/16/22 0505 01/17/22 0412 01/18/22 0356 01/18/22 1306 01/19/22 0337 01/20/22 0407  NA 126*   < > 128* 126* 125* 125* 126* 124* 128*  K 4.1   < > 4.2 4.4 4.8 4.9 5.3* 5.0 4.5  CL 87*   < > 93* 87* 89* 89* 88* 90* 92*  CO2 29   < > '30 31 29 29 28 27 29  '$ GLUCOSE 278*   < > 140* 145* 386* 388* 339* 306* 177*  BUN 72*   < > 72* 76* 89* 103* 107* 115* 114*  CREATININE 1.94*   < > 1.85* 1.85* 1.96* 2.37* 2.41* 2.35* 2.23*  ALBUMIN 2.4*  --   --   --   --   --  1.9* 1.9* 2.1*  CALCIUM 8.8*   < > 8.6* 8.3* 7.9* 8.0* 8.2* 8.0* 8.4*  PHOS  --   --   --   --   --   --  3.4 2.9 3.0  AST 16  --   --   --   --   --   --   --   --   ALT 11  --   --   --   --   --   --   --   --    < > = values in this interval not displayed.   Liver Function Tests: Recent Labs  Lab 01/13/22 1630 01/18/22 1306 01/19/22 0337 01/20/22 0407  AST 16  --   --   --   ALT 11  --   --   --   ALKPHOS 125  --   --   --   BILITOT 0.7  --   --   --   PROT 6.3*  --    --   --   ALBUMIN 2.4* 1.9* 1.9* 2.1*   No results for input(s): "LIPASE", "AMYLASE" in the last 168 hours. No results for input(s): "AMMONIA" in the last 168 hours. CBC: Recent Labs  Lab 01/16/22 0505 01/17/22 0847 01/17/22 2042 01/18/22 0356 01/19/22 0337 01/20/22 0407  WBC 15.9*  16.0* 10.5  --  9.3 9.0 8.9  NEUTROABS 12.1*  --   --   --   --   --   HGB 8.1*  7.9* 7.0*   < > 8.5* 7.8* 7.9*  HCT 24.0*  23.8* 21.1*   < > 24.6* 23.6* 23.7*  MCV 87.0  85.9 87.6  --  86.0 86.4 86.8  PLT 405*  367 292  --  286 280 283   < > = values in this interval not displayed.   Cardiac Enzymes: No results for input(s): "CKTOTAL", "CKMB", "CKMBINDEX", "TROPONINI" in the last 168 hours. CBG: Recent Labs  Lab 01/19/22 2024 01/20/22 0051 01/20/22 0536 01/20/22 0743 01/20/22 1120  GLUCAP 296* 356* 126* 76 172*    Iron Studies: No results for input(s): "IRON", "TIBC", "TRANSFERRIN", "FERRITIN" in the last 72 hours. Studies/Results: US Renal Transplant w/Doppler  Result Date: 01/18/2022 CLINICAL DATA:  Acute kidney injury, left renal transplant EXAM: ULTRASOUND OF RENAL TRANSPLANT WITH RENAL DOPPLER ULTRASOUND TECHNIQUE: Ultrasound examination of the renal transplant was performed with gray-scale, color and duplex doppler evaluation. COMPARISON:  Ultrasound 10/23/2021 FINDINGS: Transplant kidney location: LLQ Transplant Kidney: Renal measurements: 13.1 x 6.6 x 7.7 cm = volume: 348.36m. There is mild hydronephrosis. Increased renal cortical echogenicity. Color flow in the main renal artery:  Yes Color flow in the main renal vein:  Yes Duplex Doppler Evaluation: Main Renal Artery Velocity: 74.5 cm/sec Main Renal Artery Resistive Index: 0.87 Venous waveform in main renal vein:  Present Intrarenal resistive index in upper pole:  0.88 (normal 0.6-0.8; equivocal 0.8-0.9; abnormal >= 0.9) Intrarenal resistive index in lower pole: 0.98 (normal 0.6-0.8; equivocal 0.8-0.9; abnormal >= 0.9) Bladder: Normal  for degree of bladder distention. Other findings: Trace perinephric fluid without focal/drainable collection. IMPRESSION: Mild hydronephrosis of the left lower quadrant transplant kidney. Increased renal cortical echogenicity with elevated intrarenal resistive indices, slightly increased from prior exam in March. Electronically Signed   By: JMaurine SimmeringM.D.   On: 01/18/2022 17:23    amLODipine  10 mg Per Tube Daily   amoxicillin-clavulanate  800 mg Per Tube Q12H   atorvastatin  40 mg Per Tube QPM   carvedilol  25 mg Per Tube BID   Chlorhexidine Gluconate Cloth  6 each Topical Daily   cloNIDine  0.1 mg Per Tube BID   enoxaparin (LOVENOX) injection  30 mg Subcutaneous Q24H   feeding supplement (OSMOLITE 1.5 CAL)  356 mL Per Tube QID   feeding supplement (PROSource TF)  45 mL Per Tube TID   ferrous sulfate  300 mg Per Tube Daily   insulin aspart  0-5 Units Subcutaneous QHS   insulin aspart  0-9 Units Subcutaneous TID WC   insulin aspart  3 Units Subcutaneous QID   insulin glargine-yfgn  25 Units Subcutaneous QHS   multivitamin  15 mL Per Tube Daily   mycophenolate  250 mg Oral BID   oxyCODONE  20 mg Per Tube Q4H   polyethylene glycol  17 g Oral Daily   predniSONE  5 mg Per Tube Daily   tacrolimus (PROGRAF) 1 mg  1 mg Per Tube BID   tamsulosin  0.4 mg Oral Daily   vitamin B-12  500 mcg Per Tube Daily    BMET    Component Value Date/Time   NA 128 (L) 01/20/2022 0407   NA 128 (L) 12/03/2021 1425   NA 119 (LL) 06/19/2013 2156   K 4.5 01/20/2022 0407   K 3.9 06/19/2013 2156   CL 92 (L) 01/20/2022 0407   CL 86 (L) 06/19/2013 2156   CO2 29 01/20/2022 0407   CO2 30 06/19/2013 2156   GLUCOSE 177 (H) 01/20/2022 0407   GLUCOSE 69 06/19/2013 2156   BUN 114 (H) 01/20/2022 0407   BUN 76 (HH) 12/03/2021 1425   BUN 9 06/19/2013 2156   CREATININE 2.23 (  H) 01/20/2022 0407   CREATININE 2.42 (H) 12/10/2021 1507   CREATININE 0.82 06/19/2013 2156   CALCIUM 8.4 (L) 01/20/2022 0407   CALCIUM 9.9  06/19/2013 2156   GFRNONAA 34 (L) 01/20/2022 0407   GFRNONAA 31 (L) 12/10/2021 1507   GFRNONAA >60 06/19/2013 2156   GFRAA >60 08/29/2017 0717   GFRAA >60 06/19/2013 2156   CBC    Component Value Date/Time   WBC 8.9 01/20/2022 0407   RBC 2.73 (L) 01/20/2022 0407   HGB 7.9 (L) 01/20/2022 0407   HGB 13.7 06/19/2013 2156   HCT 23.7 (L) 01/20/2022 0407   HCT 39.1 (L) 06/19/2013 2156   PLT 283 01/20/2022 0407   PLT 252 06/19/2013 2156   MCV 86.8 01/20/2022 0407   MCV 84 06/19/2013 2156   MCH 28.9 01/20/2022 0407   MCHC 33.3 01/20/2022 0407   RDW 16.6 (H) 01/20/2022 0407   RDW 14.1 06/19/2013 2156   LYMPHSABS 2.2 01/16/2022 0505   LYMPHSABS 4.8 (H) 04/21/2013 0117   MONOABS 1.5 (H) 01/16/2022 0505   MONOABS 2.3 (H) 04/21/2013 0117   EOSABS 0.1 01/16/2022 0505   EOSABS 0.0 04/21/2013 0117   BASOSABS 0.0 01/16/2022 0505   BASOSABS 0.1 04/21/2013 0117    Assessment/Plan:  AKI/CKD stage IIIb - probably pre-renal due to severe hypoalbuminemia and 3rd spacing (UNa 21).  Also component of urinary retention.  Not much off from his baseline.  Will recheck urine studies and transplanted renal US to r/o hydronephrosis.  BUN elevated out of proportion so may need IVF's.  Will try IV albumin again to help with 3rd spacing. S/p kidney transplantation- continue with tacrolimus, cellcept, and prednisone.  Agree with checking prograf level before next scheduled dose, however it will take several days to return. Hyponatremia -   Urine Na low at 11 which is consistent with pre-renal and likely due to hypoalbuminemia.  Push protein and give IV albumin again today. Urinary retention - foley catheter replaced 01/19/22.  On flomax and he didn't want to undergo bladder retraining.  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  Scr has ranged 1.6-2.2 over the past 3 years and likely having some progressive CKD. Anemia of CKD stage IIIb - hold off on ESA due to [facility] of  throat.  Transfuse as needed. 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  S/p PEG.  Will need some free water boluses when Na improves. Aspiration pneumonia - seen on CXR - currently on Augmentin. Type 1 DM - per primary svc HTN - stalbe  Donetta Potts, MD [facility]

## 2022-01-20 NOTE — Progress Notes (Signed)
  Mobility Specialist Criteria Algorithm Info.   01/20/22 1400  Mobility  HOB Elevated/Bed Position Self regulated  Activity Refused mobility (Pt asleep)   Will f/u as time permits.  01/20/2022 2:00 PM  [place] Tyresha Fede, [place], [place]  LUDAP:[phone] Office: [phone]

## 2022-01-20 NOTE — Progress Notes (Addendum)
HD#6 Subjective:  Overnight Events: Glucose in 200s to 300s overnight. 3 beat run of vtach that was asymptomatic.  Patients assessed at bedside this AM. He states that he is having trouble getting pain medications at times that he needs them. He takes oxycodone at home about every 4 hours. He continues to have some cramping in upper extremities. The swelling in his lower extremities is improved.   Objective:  Vital signs in last 24 hours: Vitals:   01/19/22 1600 01/19/22 1700 01/19/22 1800 01/19/22 2019  BP: 136/60 (!) 145/69  (!) 149/68  Pulse: 62 65 64 65  Resp: '14 12 13 19  '$ Temp:    98.5 F (36.9 C)  TempSrc:    Oral  SpO2: 93% 95% 96% 93%  Weight:      Height:       Supplemental O2: Room Air SpO2: 93 %   Physical Exam:  Constitutional: alert, chronically ill appearing, NAD Neck: post-radiation changes to neck with erythema Cardiovascular: regular rate and rhythm, no m/r/g Pulmonary/Chest: normal work of breathing on room air, lungs clear to auscultation bilaterally Abdominal: soft, non-tender, non-distended, PEG tube in left lower quadrant MSK: left BKA, no edema  Neurological: oriented x4 Skin: warm and dry Psych: flat affect  Filed Weights   01/15/22 0800 01/16/22 0500 01/16/22 0900  Weight: 69.2 kg 71.3 kg 67.5 kg     Intake/Output Summary (Last 24 hours) at 01/20/2022 0701 Last data filed at 01/20/2022 0500 Gross per 24 hour  Intake 1096 ml  Output 2151 ml  Net -1055 ml   Net IO Since Admission: -5,396 mL [01/20/22 0701]  Pertinent Labs:    Latest Ref Rng & Units 01/20/2022    4:07 AM 01/19/2022    3:37 AM 01/18/2022    3:56 AM  CBC  WBC 4.0 - 10.5 K/uL 8.9  9.0  9.3   Hemoglobin 13.0 - 17.0 g/dL 7.9  7.8  8.5   Hematocrit 39.0 - 52.0 % 23.7  23.6  24.6   Platelets 150 - 400 K/uL 283  280  286        Latest Ref Rng & Units 01/20/2022    4:07 AM 01/19/2022    3:37 AM 01/18/2022    1:06 PM  CMP  Glucose 70 - 99 mg/dL 177  306  339   BUN 6 - 20  mg/dL 114  115  107   Creatinine 0.61 - 1.24 mg/dL 2.23  2.35  2.41   Sodium 135 - 145 mmol/L 128  124  126   Potassium 3.5 - 5.1 mmol/L 4.5  5.0  5.3   Chloride 98 - 111 mmol/L 92  90  88   CO2 22 - 32 mmol/L '29  27  28   '$ Calcium 8.9 - 10.3 mg/dL 8.4  8.0  8.2     Imaging: No results found.  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  Hospital course complicated by aspiration pneumonia, urinary retention, and acute on chronic anemia.  AKI on CKD stage 3a ESRD status postrenal transplant Urinary retention Foley catheter was replaced 6/10 due to decreased urine output during voiding trial.  Renal ultrasound of transplanted kidney showed mild hydronephrosis.  Serum creatinine downtrending from 2.35-2.2.  Baseline creatinine around 1.5-1.7.  Appreciate nephrology recommendations.  Urine studies reflect hypotonic hyponatremia.  Patient is intravascularly depleted and AKI likely multifactorial from pre-renal and obstruction etiology. Tacrolimus level pending.  -Avoid nephrotoxic medications -Daily BMP -Foley placed 6/6,  voiding trial 6/8, foley cath replaced 6/10  -Continue prednisone, tacrolimus, and mycophenolate -Continue Flomax -Nephrology following  Chronic hypertonic hyponatremia Hypoalbuminemia Sodium corrected with glucoses at 129, stable.  Free water flushes discontinued.  Patient receiving tube feeds and protein modulators 3 times daily. He received IV albumin 6/10 -Continue tube feeds  -45 mL Prosource tube feeds 3 times daily -Full liquid diet p.o. for comfort per SLP with fluid restriction -Trend BMP  Acute on chronic anemia Iron deficiency anemia Hemoglobin stable.  -Continue iron supplement soln per tube  Aspiration pneumonia Last dose of antibiotic today. -Continue Augmentin for total abx course of 5 days (last dose on 6/11)  Acute on chronic lower extremity edema, resolved Malnutrition of moderate degree G-tube dependent No edema on exam today. Appreciate registered dietitian recommendations.  Will begin to titrate tube feeds up as tolerated. He is at goal for tube feeds with 1.5 cartons. -Dietitian recommendations appreciated   Squamous cell carcinoma s/p radiation treatment Neck swelling Patient reports difficulty swallowing.  Able to manage secretions and no signs of airway compromise. SLP evaluation recommended full liquid diet and ice cream for comfort, patient to rely on PEG for nutrition. Nephrologist at [facility] will follow-up with patient about titrating immunosuppressive medications with cancer at follow-up visit.   Deconditioning -PT/OT recommending SNF   Type 1 diabetes mellitus Blood glucose elevated into 200-300s overnight. AM glucose in 100s. Will add HS sliding scale. -Glargine 25 units daily -SSI 4 times daily with tube feeds -HS sliding scale -Novolog 3 units QID    Diet: Tube Feeds VTE: Enoxaparin Code: Full PT/OT recs: SNF for Subacute PT.   Dispo: Anticipated discharge to Skilled nursing facility on Monday to SNF.   Maudie Shingledecker M. Nethan Caudillo, D.O.   Internal Medicine Resident, PGY-1 [facility]  Pager: [phone] 7:01 AM, 01/20/2022   **Please contact the on call pager after 5 pm and on weekends at [phone].**

## 2022-01-20 NOTE — Progress Notes (Signed)
IM MD notified update of CBG 533

## 2022-01-21 LAB — RENAL FUNCTION PANEL
Albumin: 2.2 g/dL — ABNORMAL LOW (ref 3.5–5.0)
Anion gap: 8 (ref 5–15)
BUN: 112 mg/dL — ABNORMAL HIGH (ref 6–20)
CO2: 27 mmol/L (ref 22–32)
Calcium: 8.4 mg/dL — ABNORMAL LOW (ref 8.9–10.3)
Chloride: 92 mmol/L — ABNORMAL LOW (ref 98–111)
Creatinine, Ser: 2.21 mg/dL — ABNORMAL HIGH (ref 0.61–1.24)
GFR, Estimated: 34 mL/min — ABNORMAL LOW (ref >60)
Glucose, Bld: 493 mg/dL — ABNORMAL HIGH (ref 70–99)
Phosphorus: 3.1 mg/dL (ref 2.5–4.6)
Potassium: 4.9 mmol/L (ref 3.5–5.1)
Sodium: 127 mmol/L — ABNORMAL LOW (ref 135–145)

## 2022-01-21 LAB — GLUCOSE, CAPILLARY
Glucose-Capillary: 558 mg/dL (ref 70–99)
Glucose-Capillary: 220 mg/dL — ABNORMAL HIGH (ref 70–99)
Glucose-Capillary: 166 mg/dL — ABNORMAL HIGH (ref 70–99)
Glucose-Capillary: 122 mg/dL — ABNORMAL HIGH (ref 70–99)
Glucose-Capillary: 214 mg/dL — ABNORMAL HIGH (ref 70–99)
Glucose-Capillary: 179 mg/dL — ABNORMAL HIGH (ref 70–99)

## 2022-01-21 LAB — CULTURE, BLOOD (ROUTINE X 2)
Culture: NO GROWTH
Culture: NO GROWTH
Special Requests: ADEQUATE
Special Requests: ADEQUATE

## 2022-01-21 LAB — CBC
HCT: 23.6 % — ABNORMAL LOW (ref 39.0–52.0)
Hemoglobin: 7.5 g/dL — ABNORMAL LOW (ref 13.0–17.0)
MCH: 28.5 pg (ref 26.0–34.0)
MCHC: 31.8 g/dL (ref 30.0–36.0)
MCV: 89.7 fL (ref 80.0–100.0)
Platelets: 273 10*3/uL (ref 150–400)
RBC: 2.63 MIL/uL — ABNORMAL LOW (ref 4.22–5.81)
RDW: 16.9 % — ABNORMAL HIGH (ref 11.5–15.5)
WBC: 6.9 10*3/uL (ref 4.0–10.5)
nRBC: 0 % (ref 0.0–0.2)

## 2022-01-21 LAB — TACROLIMUS LEVEL: Tacrolimus (FK506) - LabCorp: 4.6 ng/mL (ref 2.0–20.0)

## 2022-01-21 MED ORDER — INSULIN GLARGINE-YFGN 100 UNIT/ML ~~LOC~~ SOLN: 30.0000 [IU] | Freq: Every day | SUBCUTANEOUS | 11 refills | Status: DC

## 2022-01-21 MED ORDER — INSULIN ASPART 100 UNIT/ML IJ SOLN: 3.0000 [IU] | Freq: Four times a day (QID) | INTRAMUSCULAR | 11 refills | Status: DC

## 2022-01-21 MED ORDER — OSMOLITE 1.5 CAL PO LIQD
356.0000 mL | Freq: Four times a day (QID) | ORAL | 2 refills | Status: DC
Start: 2022-01-21 — End: 2024-05-03

## 2022-01-21 MED ORDER — INSULIN ASPART 100 UNIT/ML IJ SOLN
5.0000 [IU] | Freq: Once | INTRAMUSCULAR | Status: AC
  Administered 2022-01-21: 5 [IU] via SUBCUTANEOUS

## 2022-01-21 MED ORDER — NEPRO PO LIQD: 1.0000 | Freq: Every day | ORAL | 30 refills | Status: DC

## 2022-01-21 MED ORDER — HEPARIN SOD (PORK) LOCK FLUSH 100 UNIT/ML IV SOLN
500.0000 [IU] | INTRAVENOUS | Status: AC | PRN
  Administered 2022-01-21: 500 [IU]
  Filled 2022-01-21: qty 5

## 2022-01-21 MED ORDER — PROSOURCE TF PO LIQD: 45.0000 mL | Freq: Three times a day (TID) | ORAL | 2 refills | Status: DC

## 2022-01-21 MED ORDER — INSULIN ASPART 100 UNIT/ML IJ SOLN
0.0000 [IU] | Freq: Every day | INTRAMUSCULAR | 11 refills | Status: DC
Start: 2022-01-21 — End: 2022-01-21

## 2022-01-21 MED ORDER — FERROUS SULFATE 300 (60 FE) MG/5ML PO SYRP: 300.0000 mg | ORAL_SOLUTION | Freq: Every day | ORAL | 3 refills | Status: DC

## 2022-01-21 NOTE — Progress Notes (Signed)
Arthur Burke to be D/C'd to [facility] per MD order. Report called to [facility], LPN. Skin clean and dry, stage one on sacrum.  Chest port deaccessed. 3 bottles of osmolite sent with patient and Progaf returned to patient from pharmacy. An After Visit Summary was printed and given to PTAR Patient escorted via stretcher, and D/C via PTAR.  Patient's brother called by patient to pick up patient's personal wheelchair.  Melonie [place]  01/21/2022 2:41 PM

## 2022-01-21 NOTE — Discharge Summary (Addendum)
Name: Arthur Burke MRN: [id number] DOB: 09/23/1965 56 y.o. PCP: Pcp, No  Date of Admission: 01/13/2022  4:07 PM Date of Discharge: 01/21/22 Attending Physician: Dr. Daryll Drown  Discharge Diagnosis: Principal Problem:   Hyponatremia Active Problems:   History of kidney transplant   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 (HCC)   S/P BKA (below knee amputation) unilateral, left (HCC)   Anemia in chronic kidney disease   Benign essential HTN   AKI (acute kidney injury) (HCC)   Oropharyngeal cancer (HCC)   Malnutrition of moderate degree   Urinary retention   Gastrostomy tube dependent (HCC)   Aspiration pneumonia ([place])    Discharge Medications: Allergies as of 01/21/2022       Reactions   Zolpidem Other (See Comments)   Other Reaction: CNS Disorder        Medication List     STOP taking these medications    free water Soln   HumaLOG KwikPen 100 UNIT/ML KwikPen Generic drug: insulin lispro   insulin glargine 100 unit/mL Sopn Commonly known as: LANTUS   tacrolimus (PROGRAF) oral suspension 1 mg/mL mixture       TAKE these medications    amLODipine 10 MG tablet Commonly known as: NORVASC Place 1 tablet (10 mg total) into feeding tube daily. What changed: when to take this   atorvastatin 40 MG tablet Commonly known as: LIPITOR Place 1 tablet (40 mg total) into feeding tube every evening.   carvedilol 25 MG tablet Commonly known as: COREG Place 1 tablet (25 mg total) into feeding tube 2 (two) times daily.   cloNIDine 0.1 MG tablet Commonly known as: CATAPRES Place 1 tablet (0.1 mg total) into feeding tube 2 (two) times daily.   Dexcom G7 Receiver Devi 1 Product by Does not apply route once a week.   Dexcom G7 Sensor Misc 1 Product by Does not apply route once a week. Check every 10 days   feeding supplement (OSMOLITE 1.5 CAL) Liqd Place 356 mLs into feeding tube 4 (four) times daily. What changed:  how much to take when to take  this   feeding supplement (PROSource TF) liquid Place 45 mLs into feeding tube 3 (three) times daily. What changed: You were already taking a medication with the same name, and this prescription was added. Make sure you understand how and when to take each.   Nepro Liqd Take 1 Can by mouth daily. What changed: You were already taking a medication with the same name, and this prescription was added. Make sure you understand how and when to take each.   ferrous sulfate 300 (60 Fe) MG/5ML syrup Place 5 mLs (300 mg total) into feeding tube daily.   insulin aspart 100 UNIT/ML injection Commonly known as: novoLOG Inject 3 Units into the skin 4 (four) times daily.   insulin glargine-yfgn 100 UNIT/ML injection Commonly known as: SEMGLEE Inject 0.3 mLs (30 Units total) into the skin at bedtime.   lactulose 10 GM/15ML solution Commonly known as: CHRONULAC Place 15 mLs (10 g total) into feeding tube daily as needed for moderate constipation.   lidocaine 2 % solution Commonly known as: XYLOCAINE Patient: Mix 1part 2% viscous lidocaine, 1part H20. Swish & swallow 87m of diluted mixture, 342m before meals and at bedtime, up to QID What changed:  how much to take when to take this additional instructions   mycophenolate 200 MG/ML suspension Commonly known as: CELLCEPT Place 1.3 mLs (260 mg total) into feeding tube 2 (two) times  daily. What changed: how much to take   omeprazole 40 MG capsule Commonly known as: PRILOSEC Take 1 capsule (40 mg total) by mouth daily. Per tube What changed:  how to take this when to take this Another medication with the same name was removed. Continue taking this medication, and follow the directions you see here.   ondansetron 4 MG tablet Commonly known as: Zofran Place 1 tablet (4 mg total) into feeding tube 2 (two) times daily as needed for nausea or vomiting.   Oxycodone HCl 20 MG Tabs Place 20 mg into feeding tube every 4 (four) hours.    polyethylene glycol 17 g packet Commonly known as: MIRALAX / GLYCOLAX Place 17 g into feeding tube daily as needed for mild constipation.   predniSONE 5 MG tablet Commonly known as: DELTASONE Place 5 mg into feeding tube every morning.   PruTect Emul Apply 1 application. topically 2 (two) times daily as needed (Apply to redness/hyperpigmentation in neck from radiation at least 2x day. Call Dr. Pearlie Oyster nurse at [phone] if you have questions).   STOOL SOFTENER PO Place 1 capsule into feeding tube every morning.   tacrolimus 1 MG capsule Commonly known as: PROGRAF 1 mg 2 (two) times daily. Per tube   tamsulosin 0.4 MG Caps capsule Commonly known as: FLOMAX Take 1 capsule (0.4 mg total) by mouth daily. What changed:  how to take this when to take this additional instructions   vitamin B-12 500 MCG tablet Commonly known as: CYANOCOBALAMIN Place 1 tablet (500 mcg total) into feeding tube daily.   Vitrum Senior Tabs Place 1 tablet into feeding tube daily.        Disposition and follow-up:   Arthur Burke was discharged from [facility] in Stable condition.  At the hospital follow up visit please address:  1.  Follow-up: a. AKI- multifactorial pre-renal from hypoalbuminemia and obstruction from urinary retention. [place] nephrology was updated and he will need to see them as outpatient. Tacrolimus level pending.   b. Urinary retention- on flomax at home, failed void trial and foley        catheter  was replaced 6/10.  c. Hyponatremia- hypertonic from hypoalbunemia. RD consulted and  recommended QID tube feeds with Osmolite and Prosource TID. Fluid water restriction at 1200 mL. Na improved to 133 when corrected for glucose.  d. Hyperglycemia- Blood sugar uncontrolled with addition of tube feeds. Discharge home with Glargine 30 units QHS, Humalog 3 units QID, and sliding scale. He does not have a dexcom yet and will bring in a record of blood sugars when he  comes to clinic.  E. Iron deficiency anemia/ Anemia of chronic disease- iron studies consistent with deficiency. Hgb dropped to 7 after several blood draws. He received 1 unit PRBs. He is not able to receive ESA with active cancer  D. Needs to follow-up about immunosuppressive therapy while having active cancer. [facility] nephrologist requested he see him in clinic sometime soon after discharge.  2.  Labs / imaging needed at time of follow-up: BMP, CBC  3.  Pending labs/ test needing follow-up: Tacrolimus level  4.  Medication Changes A. Insulin- Glargine 30 units qhs, humalog 3 units QID, Humalog sliding scale B. Iron supplement suspension  Follow-up Appointments:  Follow-up Information     [place]. Go on 01/25/2022.   Why: You have an appointment with the clinic at 10:15 AM on June 16th, 2023. Contact information: [address]  [place] [phone]        [facility]. Schedule an appointment as soon as possible for a visit in 2 week(s).   Specialty: Nephrology Contact information: [address] [place] [address] [place] Hospital Course by problem list: Non-oliguric AKI ESRD s/p renal tranplant Patient has had decreasing urine output since starting radiation therapy. His renal function has been monitored closely as it has been worsening lately. Foley catheter was placed 6/08 due to urinary retention. Voiding trial was failed and foley placed again  6/10. Creatinine acutely worsened to greater than 2.3. I talked with Dr. Tally Due ([facility] transplant nephrology) and unlikely that acute worsening due to rejection. Renal ultrasound obtained and showed hydronephrosis. Thinking that AKI multifactorial with pre-renal (intravascularly depleted from hypoalbuminemia) and obstruction. Tacrolimus level was checked and is pending. He will need to follow-up with [place]  nephrology and oncology to clarify if immunosuppression dosing was adjusted with him having active cancer.   Urinary retention Foley catheter was placed 6/08 due to urinary retention. Home medication of flomax was continued during admission. He did not pass voiding trial and renal US of transplanted kidney showed mild hydronephrosis. Foley catheter was replaced 6/10. He will need to follow-up with urology.  T1DM Blood sugar uncontrolled into 300s to 500s with increasing tube feeds. He is receiving tube feeds 4 times daily. Regimen at discharge included long acting insulin increased to Glargine 30 units nightly, Humalog 3 units QID with tube feeds with sliding scale.   Iron deficiency anemia Acute on chronic anemia Iron studies consistent with iron deficency. Hgb dropped to 7. No signs of bleeding and I think drop was likely from lab draws. He was given 1 unit of RBCs and started on iron suspension solution. Unsure of last colonoscopy and would recommend making shared decision about indications for colonoscopy with iron deficiency anemia with his other comorbidities.  Acute on chronic lower extremity edema Malnutrition G-tube dependent Patient came in with acute on chronic lower extremity swelling in the setting of hyponatremia and low albumin. Swelling improved with diuresis. Registered dietician assisted in making recommendations for tube feeds. He was tolerating tube feeds at goal prior to discharge.   Moderate Hyponatremia Sodium on admission 126, corrects to 133. Urine studies consistent with hypertonic hyponatremia from third spacing 2/2 to hypoalbuminemia. He was given lasix, albumin and nutrition was maximized with tube feeds.   Aspiration pneumonia He mounted a leukocytosis and elevated temperature. In setting of being immunocompromised, CXR and blood culture were obtained. CXR showed small left pleural effusion with new overlying basilar airspace opacities. He completed Augmentin while  inpatient.   Squamous cell carcinoma s/p radiation treament Neck swelling SLP evaluated and recommended patient have full liquid and eat as able.   Deconditioning PT/ OT recommended SNF to help with ADLs.  HTN Continue home coreg 25, clonidine 0.1, and and amlodipine 10.   Chronic pain Continued home oxycodone 20 Q4 PRN  GERD Continue home omeprazole   HLD Continue home atorvastatin    Discharge Subjective: Patient assessed at bedside. He states that he didn't get much sleep last night from having blood glucose checked but feels ok otherwise. He is ready to go to [place] today. We talked about need for follow-up with [facility], urology, and [place] nephrology.   Discharge Exam:   BP (!) 156/66 (BP Location: Right Arm)   Pulse 64  Temp 98.6 F (37 C) (Oral)   Resp 17   Ht 6' (1.829 m) Comment: patient reported  Wt 67.5 kg   SpO2 97%   BMI 20.19 kg/m  Constitutional: Chronically ill appearing, in no acute distress HENT: radiation changes to anterior neck with edema and erythema Cardiovascular: regular rate and rhythm, 2/6 systolic murmur, left AVF in wrist Pulmonary/Chest: normal work of breathing on room air, lungs clear to auscultation bilaterally Abdominal: soft, non-tender, non-distended, PEG tube in place in left lower abdomen MSK: left BKA, trace edema in lower extremities bilaterally Neurological: alert & oriented x 3 Skin: warm and dry Psych: normal mood and affect   Pertinent Labs, Studies, and Procedures:     Latest Ref Rng & Units 01/21/2022    1:49 AM 01/20/2022    4:07 AM 01/19/2022    3:37 AM  CBC  WBC 4.0 - 10.5 K/uL 6.9  8.9  9.0   Hemoglobin 13.0 - 17.0 g/dL 7.5  7.9  7.8   Hematocrit 39.0 - 52.0 % 23.6  23.7  23.6   Platelets 150 - 400 K/uL 273  283  280        Latest Ref Rng & Units 01/21/2022    1:49 AM 01/20/2022    4:07 AM 01/19/2022    3:37 AM  CMP  Glucose 70 - 99 mg/dL 493  177  306   BUN 6 - 20 mg/dL 112  114  115   Creatinine 0.61 - 1.24  mg/dL 2.21  2.23  2.35   Sodium 135 - 145 mmol/L 127  128  124   Potassium 3.5 - 5.1 mmol/L 4.9  4.5  5.0   Chloride 98 - 111 mmol/L 92  92  90   CO2 22 - 32 mmol/L '27  29  27   '$ Calcium 8.9 - 10.3 mg/dL 8.4  8.4  8.0     DG Chest 2 View  Result Date: 01/13/2022 CLINICAL DATA:  Edema in the lower extremity EXAM: CHEST - 2 VIEW COMPARISON:  12/20/2021 FINDINGS: Transverse diameter of heart is slightly increased. There are no signs of pulmonary edema or focal pulmonary consolidation. Patient's chin is partly obscuring the apices. Left lateral CP angle is not included. As far as seen, there is no significant pleural effusion or pneumothorax. Right IJ chest port is seen with its tip at the junction of superior vena cava and right atrium. Old fracture is seen in the right clavicle. IMPRESSION: No active cardiopulmonary disease. Electronically Signed   By: Elmer Picker M.D.   On: 01/13/2022 17:27     Discharge Instructions: Discharge Instructions     Ambulatory referral to Urology   Complete by: As directed    Urinary retention- foley catheter placed 6/10 after failing void trial.   Diet - low sodium heart healthy   Complete by: As directed    Discharge instructions   Complete by: As directed    Arthur Burke, [place] were recently admitted to [facility] for electrolyte imbalance. You developed a kidney injury, received blood products, and were treated for aspiration pneumonia. You will need to follow-up with urology as an outpatient due to urinary retention. Please also follow-up with Dr. Tresa Garter at [facility].  Continue taking your home medications with the following changes  1. Start taking       A. Osmolite 356 mL 4 times daily       B. Prosource 45 mL 3 times daily  C. Fluid restriction to 1,200 mL daily       D. Iron suspension 2. Insulin requirements have increased with additional tube feeds.      A. Glargine 30 units at bedtime     B. Humalog 3 units 4 times daily with  tube feeds     C. Sliding scale Humalog 4 times daily with tube feeds 0-9 units  Follow-up with the Internal Medicine Clinic on Thursday, June 15 at 10:15 AM. Dennis Bast will need to follow-up with urology and [facility] nephrology. We are so glad that you are feeling better.  Sincerely, Katie Masters, DO   Increase activity slowly   Complete by: As directed    No wound care   Complete by: As directed        Signed: Sanjuan Dame, MD 01/21/2022, 1:05 PM   Pager: [phone]

## 2022-01-21 NOTE — Plan of Care (Signed)
  Problem: Education: Goal: Knowledge of General Education information will improve Description Including pain rating scale, medication(s)/side effects and non-pharmacologic comfort measures Outcome: Progressing   

## 2022-01-21 NOTE — Progress Notes (Signed)
Inpatient Diabetes Program Recommendations  AACE/ADA: New Consensus Statement on Inpatient Glycemic Control   Target Ranges:  Prepandial:   less than 140 mg/dL      Peak postprandial:   less than 180 mg/dL (1-2 hours)      Critically ill patients:  140 - 180 mg/dL    Latest Reference Range & Units 01/21/22 00:33 01/21/22 06:06 01/21/22 08:04 01/21/22 09:57  Glucose-Capillary 70 - 99 mg/dL 558 (HH) 220 (H) 166 (H) 122 (H)    Latest Reference Range & Units 01/20/22 07:43 01/20/22 11:20 01/20/22 15:26 01/20/22 20:55 01/20/22 23:32  Glucose-Capillary 70 - 99 mg/dL 76 172 (H) 233 (H) 417 (H) 533 (HH)   Review of Glycemic Control  Diabetes history: DM1 Outpatient Diabetes medications: Lantus 22 units daily, Humalog 0-9 units TID with meals Current orders for Inpatient glycemic control: Semglee 25 units QHS, Novolog 0-9 units TID with meals, Novolog 0-5 units QHS, Novolog 3 units QID (given with tube feeding timing); Prednisone 5 mg QAM, Osmolite 356 ml QID (10:00, 14:00, 18:00, 22:00)  Inpatient Diabetes Program Recommendations:    Insulin: Please consider increasing tube feeding coverage to Novolog 5 units QID. May want to consider changing CBGs and Novolog correction to Q4H.  Thanks, Barnie Alderman, RN, MSN, [place] Diabetes Coordinator Inpatient Diabetes Program [phone] (Team Pager from 8am to 5pm)'

## 2022-01-21 NOTE — Progress Notes (Signed)
IM notified CBG 558 post 9 unit Novolog

## 2022-01-21 NOTE — Progress Notes (Signed)
Patient ID: Arthur Burke, male   DOB: [birth year], 56 y.o.   MRN: [id number]  Assessment/Plan:  AKI/CKD stage IIIb - probably pre-renal due to severe hypoalbuminemia and 3rd spacing (UNa 21).  Also component of urinary retention.  Not much off from his baseline.  Transplanted renal US shows mild hydronephrosis.  BUN elevated out of proportion so may need IVF's.  Renal function is stable and at his baseline. He used to be on Lasix '40mg'$  twice daily with good results; eventually may need to restart Lasix but would dose based on his weights. S/p kidney transplantation- continue with tacrolimus, cellcept, and prednisone.  Agree with checking prograf level before next scheduled dose, however it will take several days to return. Hyponatremia -   Urine Na low at 11 which is consistent with pre-renal and likely due to hypoalbuminemia.  Push protein and give IV albumin again today. Urinary retention - foley catheter replaced 01/19/22.  On flomax and he didn't want to undergo bladder retraining.  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  Scr has ranged 1.6-2.2 over the past 3 years and likely having some progressive CKD. Anemia of CKD stage IIIb - hold off on ESA due to [facility] of throat.  Transfuse as needed. 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  S/p PEG.  Will need some free water boluses when Na improves. Aspiration pneumonia - seen on CXR - currently on Augmentin. Type 1 DM - per primary svc HTN - stalbe   S:No complaints.  Foley catheter replaced.  Given IV albumin with improvement of sodium and Cr but BUN still elevated.  O:BP (!) 156/66 (BP Location: Right Arm)   Pulse 64   Temp 98.6 F (37 C) (Oral)   Resp 17   Ht 6' (1.829 m) Comment: patient reported  Wt 67.5 kg   SpO2 97%   BMI 20.19 kg/m   Intake/Output Summary (Last 24 hours) at 01/21/2022 1207 Last data filed at 01/21/2022 1122 Gross per 24 hour  Intake 511 ml  Output 1604 ml  Net -1093 ml    Intake/Output: I/O last 3 completed shifts: In: 8250 [Other:140; NG/GT:802; IV Piggyback:100] Out: 1852 [Urine:1850; Stool:2]  Intake/Output this shift:  Total I/O In: -  Out: 1202 [Urine:1200; Stool:2] Weight change:  Gen:NAD CVS: RRR Resp: CTA Abd: +BS, soft, NT/ND Ext: s/p LBKA, 1+ edema of right arm, trace presacral edema. Access: rt chest wall port, left Cimino easily palpable only in arterial limb + thrill  Recent Labs  Lab 01/16/22 0505 01/17/22 0412 01/18/22 0356 01/18/22 1306 01/19/22 0337 01/20/22 0407 01/21/22 0149  NA 126* 125* 125* 126* 124* 128* 127*  K 4.4 4.8 4.9 5.3* 5.0 4.5 4.9  CL 87* 89* 89* 88* 90* 92* 92*  CO2 '31 29 29 28 27 29 27  '$ GLUCOSE 145* 386* 388* 339* 306* 177* 493*  BUN 76* 89* 103* 107* 115* 114* 112*  CREATININE 1.85* 1.96* 2.37* 2.41* 2.35* 2.23* 2.21*  ALBUMIN  --   --   --  1.9* 1.9* 2.1* 2.2*  CALCIUM 8.3* 7.9* 8.0* 8.2* 8.0* 8.4* 8.4*  PHOS  --   --   --  3.4 2.9 3.0 3.1   Liver Function Tests: Recent Labs  Lab 01/19/22 0337 01/20/22 0407 01/21/22 0149  ALBUMIN 1.9* 2.1* 2.2*   No results for input(s): "LIPASE", "AMYLASE" in the last 168 hours. No results for input(s): "AMMONIA" in the last 168 hours. CBC: Recent Labs  Lab 01/16/22 0505 01/17/22 0847 01/17/22 2042 01/18/22 0356 01/19/22 0337 01/20/22 0407 01/21/22 0149  WBC 15.9*  16.0* 10.5  --  9.3 9.0 8.9 6.9  NEUTROABS 12.1*  --   --   --   --   --   --   HGB 8.1*  7.9* 7.0*   < > 8.5* 7.8* 7.9* 7.5*  HCT 24.0*  23.8* 21.1*   < > 24.6* 23.6* 23.7* 23.6*  MCV 87.0  85.9 87.6  --  86.0 86.4 86.8 89.7  PLT 405*  367 292  --  286 280 283 273   < > = values in this interval not displayed.   Cardiac Enzymes: No results for input(s): "CKTOTAL", "CKMB", "CKMBINDEX", "TROPONINI" in the last 168 hours. CBG: Recent Labs  Lab 01/21/22 0033 01/21/22 0606 01/21/22 0804 01/21/22 0957 01/21/22 1200  GLUCAP 558* 220* 166* 122* 214*    Iron Studies: No  results for input(s): "IRON", "TIBC", "TRANSFERRIN", "FERRITIN" in the last 72 hours. Studies/Results: No results found.   amLODipine  10 mg Per Tube Daily   atorvastatin  40 mg Per Tube QPM   carvedilol  25 mg Per Tube BID   Chlorhexidine Gluconate Cloth  6 each Topical Daily   cloNIDine  0.1 mg Per Tube BID   enoxaparin (LOVENOX) injection  40 mg Subcutaneous Q24H   feeding supplement (OSMOLITE 1.5 CAL)  356 mL Per Tube QID   feeding supplement (PROSource TF)  45 mL Per Tube TID   ferrous sulfate  300 mg Per Tube Daily   insulin aspart  0-5 Units Subcutaneous QHS   insulin aspart  0-9 Units Subcutaneous TID WC   insulin aspart  3 Units Subcutaneous QID   insulin glargine-yfgn  25 Units Subcutaneous QHS   multivitamin  15 mL Per Tube Daily   mycophenolate  250 mg Oral BID   oxyCODONE  20 mg Per Tube Q4H   polyethylene glycol  17 g Oral Daily   predniSONE  5 mg Per Tube Daily   tacrolimus (PROGRAF) 1 mg  1 mg Per Tube BID   tamsulosin  0.4 mg Oral Daily   vitamin B-12  500 mcg Per Tube Daily    BMET    Component Value Date/Time   NA 127 (L) 01/21/2022 0149   NA 128 (L) 12/03/2021 1425   NA 119 (LL) 06/19/2013 2156   K 4.9 01/21/2022 0149   K 3.9 06/19/2013 2156   CL 92 (L) 01/21/2022 0149   CL 86 (L) 06/19/2013 2156   CO2 27 01/21/2022 0149   CO2 30 06/19/2013 2156   GLUCOSE 493 (H) 01/21/2022 0149   GLUCOSE 69 06/19/2013 2156   BUN 112 (H) 01/21/2022 0149   BUN 76 (HH) 12/03/2021 1425   BUN 9 06/19/2013 2156   CREATININE 2.21 (H) 01/21/2022 0149   CREATININE 2.42 (H) 12/10/2021 1507   CREATININE 0.82 06/19/2013 2156   CALCIUM 8.4 (L) 01/21/2022 0149   CALCIUM 9.9 06/19/2013 2156   GFRNONAA 34 (L) 01/21/2022 0149   GFRNONAA 31 (L) 12/10/2021 1507   GFRNONAA >60 06/19/2013 2156   GFRAA >60 08/29/2017 0717   GFRAA >60 06/19/2013 2156   CBC    Component Value Date/Time   WBC 6.9 01/21/2022 0149   RBC 2.63 (L) 01/21/2022 0149   HGB 7.5 (L) 01/21/2022 0149    HGB 13.7 06/19/2013 2156   HCT 23.6 (L) 01/21/2022 0149   HCT 39.1 (L) 06/19/2013 2156   PLT 273 01/21/2022  0149   PLT 252 06/19/2013 2156   MCV 89.7 01/21/2022 0149   MCV 84 06/19/2013 2156   MCH 28.5 01/21/2022 0149   MCHC 31.8 01/21/2022 0149   RDW 16.9 (H) 01/21/2022 0149   RDW 14.1 06/19/2013 2156   LYMPHSABS 2.2 01/16/2022 0505   LYMPHSABS 4.8 (H) 04/21/2013 0117   MONOABS 1.5 (H) 01/16/2022 0505   MONOABS 2.3 (H) 04/21/2013 0117   EOSABS 0.1 01/16/2022 0505   EOSABS 0.0 04/21/2013 0117   BASOSABS 0.0 01/16/2022 0505   BASOSABS 0.1 04/21/2013 0117

## 2022-01-21 NOTE — TOC Transition Note (Addendum)
Transition of Care [facility]) - CM/SW Discharge Note   Patient Details  Name: Arthur Burke MRN: [id number] Date of Birth: [birth year]  [facility] CM/SW Contact:  Joanne Chars, LCSW Phone Number: 01/21/2022, 11:56 AM   Clinical Narrative:   Pt discharging to [facility],  RN call [phone] for report.    1100: CSW confirmed with Jan/[facility] that they are ready to receive pt.    Final next level of care: Skilled Nursing Facility Barriers to Discharge: Barriers Resolved   Patient Goals and CMS Choice Patient states their goals for this hospitalization and ongoing recovery are:: get back home   Choice offered to / list presented to : Patient (pt reports brother has arranged for him to go to [facility])  Discharge Placement              Patient chooses bed at:  ([facility]) Patient to be transferred to facility by: [place] Name of family member notified: brother Shanon Brow Patient and family notified of of transfer: 01/21/22  Discharge Plan and Services In-house Referral: Clinical Social Work   Post Acute Care Choice: [place]                               Social Determinants of Health (SDOH) Interventions     Readmission Risk Interventions     No data to display

## 2022-01-21 NOTE — Progress Notes (Signed)
Dr Ileene Musa informed 0600 CBG 220 Via text and Amion

## 2022-01-21 NOTE — Progress Notes (Signed)
Patient has been persistently hyperglycemic despite adequate insulin dosing based on his recent insulin requirements. He has gotten 22 units of short acting in the last 9 hours. Earlier this evening ordered a lab verification of fingerstick glucose which was consistent with the elevated sugars near 500. Went to bedside to assess. Patient says that his insulin has been administered normally, alternating between his arm and his stomach. On inspection his left arm does have significant subcutaneous edema which may be affecting the rate of absorption of his insulin. I am concerned that giving him more insulin at this time will cause a delayed large drop in his sugars. Will change blood sugar checks to Q1 to monitor for improvement in his sugars and to hopefully prevent any lows. He is not getting any further tube feedings tonight so I do not expect his sugars to continue going up.

## 2022-01-21 NOTE — Progress Notes (Signed)
IM notified that blood glucose 493 post 5 units novelog

## 2022-01-22 ENCOUNTER — Telehealth (HOSPITAL_COMMUNITY): Payer: Self-pay

## 2022-01-22 NOTE — Telephone Encounter (Signed)
I called patients brother to attempt to schedule a S/P XRT CK with Dental Medicine. Patients brother stated that he would be seeing him later today and would ask patient about scheduling an appt for around the 2nd week in July. Patients brother also stated that he or patient would give me a call back to schedule appt.

## 2022-01-23 ENCOUNTER — Other Ambulatory Visit (HOSPITAL_COMMUNITY): Payer: Self-pay

## 2022-01-25 ENCOUNTER — Other Ambulatory Visit (HOSPITAL_COMMUNITY): Payer: Self-pay

## 2022-01-25 MED ORDER — MYCOPHENOLATE MOFETIL 200 MG/ML PO SUSR
ORAL | 1 refills | Status: DC
  Filled 2022-01-25: qty 160, 30d supply, fill #0
  Filled 2022-02-22: qty 160, 30d supply, fill #1

## 2022-01-28 ENCOUNTER — Ambulatory Visit: Payer: Medicare Other | Admitting: Radiation Oncology

## 2022-01-28 ENCOUNTER — Other Ambulatory Visit (HOSPITAL_COMMUNITY): Payer: Self-pay

## 2022-01-28 ENCOUNTER — Encounter: Payer: Self-pay | Admitting: Nutrition

## 2022-01-28 ENCOUNTER — Telehealth: Payer: Self-pay | Admitting: *Deleted

## 2022-01-28 ENCOUNTER — Inpatient Hospital Stay: Payer: Medicare Other | Attending: Nutrition | Admitting: Nutrition

## 2022-01-28 NOTE — Telephone Encounter (Signed)
CALLED PATIENT TO INQUIRE IF HE IS COMING TODAY FOR HIS FU, LVM FOR A RETURN CALL

## 2022-01-28 NOTE — Progress Notes (Signed)
Patient did not show up for nutrition follow-up. 

## 2022-01-29 ENCOUNTER — Other Ambulatory Visit (HOSPITAL_COMMUNITY): Payer: Self-pay

## 2022-01-30 ENCOUNTER — Telehealth: Payer: Self-pay

## 2022-01-30 NOTE — Telephone Encounter (Signed)
Called and spoke with staff member at patient's SNF rehab ([facility]) to see if they were aware of patient's F/U appointment with Dr. Isidore Moos this past Monday. Staff member stated they were not aware, but would be able to bring patient to future appointment. Staff member stated transportation would just need to be arranged through their coordinator [place]. Asked to be connected to [facility] or provided her direct contact number. Staff member stated that wasn't possible, but she took my office number and stated she would pass along to [place] to contact me directly.

## 2022-01-31 ENCOUNTER — Encounter: Payer: Medicare Other | Admitting: Internal Medicine

## 2022-01-31 NOTE — Progress Notes (Deleted)
CC: Hospital Follow up  HPI:  Mr.Sekai A Niznik is a 56 y.o. with medical history as below presenting to [facility] for hospital follow up.   Please see problem-based list for further details, assessments, and plans.  Past Medical History:  Diagnosis Date   Acute osteomyelitis of left calcaneus (HCC)    Cancer ([place])    Diabetes mellitus without complication ([place])    Diabetic foot infection ([place])    Hypertension    Infected ulcer of skin, with fat layer exposed ([place])    Lower limb ulcer, ankle, left, with necrosis of muscle (HCC)    MRSA (methicillin resistant staph aureus) culture positive    Renal disorder    Vision impairment     Current Outpatient Medications (Endocrine & Metabolic):    insulin aspart (NOVOLOG) 100 UNIT/ML injection, Inject 3 Units into the skin 4 (four) times daily.   insulin glargine-yfgn (SEMGLEE) 100 UNIT/ML injection, Inject 0.3 mLs (30 Units total) into the skin at bedtime.   predniSONE (DELTASONE) 5 MG tablet, Place 5 mg into feeding tube every morning.  Current Outpatient Medications (Cardiovascular):    amLODipine (NORVASC) 10 MG tablet, Place 1 tablet (10 mg total) into feeding tube daily. (Patient taking differently: Place 10 mg into feeding tube every morning.)   atorvastatin (LIPITOR) 40 MG tablet, Place 1 tablet (40 mg total) into feeding tube every evening.   carvedilol (COREG) 25 MG tablet, Place 1 tablet (25 mg total) into feeding tube 2 (two) times daily.   cloNIDine (CATAPRES) 0.1 MG tablet, Place 1 tablet (0.1 mg total) into feeding tube 2 (two) times daily.   Current Outpatient Medications (Analgesics):    Oxycodone HCl 20 MG TABS, Place 20 mg into feeding tube every 4 (four) hours.  Current Outpatient Medications (Hematological):    ferrous sulfate 300 (60 Fe) MG/5ML syrup, Place 5 mLs (300 mg total) into feeding tube daily.   vitamin B-12 (CYANOCOBALAMIN) 500 MCG tablet, Place 1 tablet (500 mcg total) into feeding tube daily.  Current  Outpatient Medications (Other):    Continuous Blood Gluc Receiver ([place]) [place], 1 Product by Does not apply route once a week.   Continuous Blood Gluc Sensor (DEXCOM G7 SENSOR) MISC, 1 Product by Does not apply route once a week. Check every 10 days   Docusate Calcium (STOOL SOFTENER PO), Place 1 capsule into feeding tube every morning.   lactulose (CHRONULAC) 10 GM/15ML solution, Place 15 mLs (10 g total) into feeding tube daily as needed for moderate constipation.   lidocaine (XYLOCAINE) 2 % solution, Patient: Mix 1part 2% viscous lidocaine, 1part H20. Swish & swallow 84m of diluted mixture, 369m before meals and at bedtime, up to QID (Patient taking differently: 5 mLs See admin instructions. Patient: Mix 1part 2% viscous lidocaine with 1part H20. Swish & swallow 5 ml of diluted mixture, 3014mbefore meals and at bedtime as needed for mouth pain)   Multiple Vitamins-Minerals (VITRUM SENIOR) TABS, Place 1 tablet into feeding tube daily.   mycophenolate (CELLCEPT) 200 MG/ML suspension, Place 1.3 mLs (260 mg total) into feeding tube 2 (two) times daily. (Patient taking differently: Place 500 mg into feeding tube 2 (two) times daily.)   mycophenolate (CELLCEPT) 200 MG/ML suspension, Take 2.5 mLs (500 mg total) by mouth 2 (two) times daily.   Nutritional Supplements (FEEDING SUPPLEMENT, OSMOLITE 1.5 CAL,) LIQD, Place 356 mLs into feeding tube 4 (four) times daily.   Nutritional Supplements (FEEDING SUPPLEMENT, PROSOURCE TF,) liquid, Place 45 mLs into feeding  tube 3 (three) times daily.   Nutritional Supplements (NEPRO) LIQD, Take 1 Can by mouth daily.   omeprazole (PRILOSEC) 40 MG capsule, Take 1 capsule (40 mg total) by mouth daily. Per tube (Patient taking differently: 40 mg every morning. Per tube)   ondansetron (ZOFRAN) 4 MG tablet, Place 1 tablet (4 mg total) into feeding tube 2 (two) times daily as needed for nausea or vomiting.   polyethylene glycol (MIRALAX / GLYCOLAX) 17 g packet,  Place 17 g into feeding tube daily as needed for mild constipation.   tacrolimus (PROGRAF) 1 MG capsule, 1 mg 2 (two) times daily. Per tube   tamsulosin (FLOMAX) 0.4 MG CAPS capsule, Take 1 capsule (0.4 mg total) by mouth daily. (Patient taking differently: 0.4 mg every evening. Per tube)   Wound Dressings (PRUTECT) EMUL, Apply 1 application. topically 2 (two) times daily as needed (Apply to redness/hyperpigmentation in neck from radiation at least 2x day. Call Dr. Pearlie Oyster nurse at [phone] if you have questions).  Review of Systems:  Review of system negative unless stated in the problem list or HPI.    Physical Exam:  There were no vitals filed for this visit.  Physical Exam General: NAD HENT: NCAT Lungs: CTAB, no wheeze, rhonchi or rales.  Cardiovascular: Normal heart sounds, no r/m/g, 2+ pulses in all extremities. No LE edema Abdomen: No TTP, normal bowel sounds MSK: No asymmetry or muscle atrophy.  Skin: no lesions noted on exposed skin Neuro: Alert and oriented x4. CN grossly intact Psych: Normal mood and normal affect   Assessment & Plan:   See Encounters Tab for problem based charting.  Patient discussed with Dr. {NAMES:[id number]::"Butcher","Guilloud","Hoffman","Mullen","Narendra","Raines","Vincent"} Idamae Schuller, MD   Mr. Rickel is a 56 year old man with PMH of ESRD s/p transplant, on chronic prograf and prednisone, DM1, left BKA, SQC of the throat who presented with worsening sore throat and increase swelling in the legs.  He notes finishing a recent course of radiation.    1.  Follow-up: a. AKI- multifactorial pre-renal from hypoalbuminemia and obstruction from urinary retention. [facility] was updated and he will need to see them as outpatient. Tacrolimus level pending.              b. Urinary retention- on flomax at home, failed void trial and foley catheter  was replaced 6/10. Follow with nephrology and urology needed.    c. Hyponatremia- hypertonic from  hypoalbunemia. RD consulted and  recommended QID tube feeds with Osmolite and Prosource TID. Fluid water restriction at 1200 mL. Na improved to 133 when corrected for glucose.   d. Hyperglycemia- Blood sugar uncontrolled with addition of tube feeds. Discharge home with Glargine 30 units QHS, Humalog 3 units QID, and sliding scale. He does not have a dexcom yet and will bring in a record of blood sugars when he comes to clinic.   E. Iron deficiency anemia/ Anemia of chronic disease- iron studies consistent with deficiency. Hgb dropped to 7 after several blood draws. He received 1 unit PRBs. He is not able to receive ESA with active cancer   D. Needs to follow-up about immunosuppressive therapy while having active cancer. [facility] nephrologist requested he see him in clinic sometime soon after discharge.   2.  Labs / imaging needed at time of follow-up: BMP, CBC   3.  Pending labs/ test needing follow-up: Tacrolimus level   4.  Medication Changes A. Insulin- Glargine 30 units qhs, humalog 3 units QID, Humalog sliding scale B. Iron supplement suspension

## 2022-02-01 ENCOUNTER — Other Ambulatory Visit (HOSPITAL_COMMUNITY): Payer: Self-pay

## 2022-02-04 ENCOUNTER — Encounter: Payer: Self-pay | Admitting: Radiation Oncology

## 2022-02-04 ENCOUNTER — Ambulatory Visit: Payer: Medicare Other | Attending: Radiation Oncology

## 2022-02-04 ENCOUNTER — Other Ambulatory Visit: Payer: Self-pay

## 2022-02-04 ENCOUNTER — Ambulatory Visit
Admission: RE | Admit: 2022-02-04 | Discharge: 2022-02-04 | Disposition: A | Discharge status: Home / Self Care | Payer: Medicare Other | Source: Ambulatory Visit | Attending: Radiation Oncology | Admitting: Radiation Oncology

## 2022-02-04 VITALS — BP 151/67 | HR 65 | Temp 98.0°F | Resp 18 | Ht 72.0 in | Wt 149.4 lb

## 2022-02-04 DIAGNOSIS — C090 Malignant neoplasm of tonsillar fossa: Secondary | ICD-10-CM

## 2022-02-06 NOTE — Progress Notes (Signed)
                                                                                                                                                             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  Cancer Staging:  Cancer Staging  Cancer of tonsillar fossa ([place]) Staging form: Pharynx - HPV-Mediated Oropharynx, AJCC 8th Edition - Clinical stage from 11/09/2021: Stage III (cT2, cN3, cM0, p16+) - Signed by Eppie Gibson, MD on 11/09/2021 Stage prefix: Initial diagnosis  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 Multidisciplinary support was coordinated given his comorbid conditions.  Plan: The patient will follow-up with radiation oncology in 2-3 wks.  -----------------------------------  Eppie Gibson, MD

## 2022-02-08 ENCOUNTER — Telehealth (HOSPITAL_COMMUNITY): Payer: Self-pay

## 2022-02-11 ENCOUNTER — Encounter: Payer: Medicare Other | Admitting: Internal Medicine

## 2022-02-14 ENCOUNTER — Other Ambulatory Visit (HOSPITAL_COMMUNITY): Payer: Self-pay

## 2022-02-20 ENCOUNTER — Other Ambulatory Visit (HOSPITAL_COMMUNITY): Payer: Self-pay

## 2022-02-22 ENCOUNTER — Other Ambulatory Visit (HOSPITAL_COMMUNITY): Payer: Self-pay

## 2022-02-25 ENCOUNTER — Other Ambulatory Visit (HOSPITAL_COMMUNITY): Payer: Self-pay

## 2022-02-26 ENCOUNTER — Other Ambulatory Visit (HOSPITAL_COMMUNITY): Payer: Self-pay

## 2022-03-02 ENCOUNTER — Other Ambulatory Visit (HOSPITAL_COMMUNITY): Payer: Self-pay

## 2022-03-06 ENCOUNTER — Other Ambulatory Visit (HOSPITAL_COMMUNITY): Payer: Self-pay

## 2022-03-06 ENCOUNTER — Ambulatory Visit: Payer: Self-pay | Admitting: Radiation Oncology

## 2022-03-19 ENCOUNTER — Other Ambulatory Visit (HOSPITAL_COMMUNITY): Payer: Self-pay

## 2022-03-21 ENCOUNTER — Other Ambulatory Visit (HOSPITAL_COMMUNITY): Payer: Self-pay

## 2022-03-25 ENCOUNTER — Other Ambulatory Visit (HOSPITAL_COMMUNITY): Payer: Self-pay

## 2022-03-29 ENCOUNTER — Other Ambulatory Visit: Payer: Self-pay | Admitting: *Deleted

## 2022-04-01 NOTE — Telephone Encounter (Signed)
Patient gets his care mostly at [facility] so [facility] has been managing his transplant medications.

## 2022-04-04 ENCOUNTER — Telehealth: Payer: Self-pay | Admitting: *Deleted

## 2022-04-04 NOTE — Telephone Encounter (Signed)
CALLED PATIENT TO INFORM OF PET SCAN FOR 04-19-22- ARRIVAL TIME- 7:30 AM @ [facility], PATIENT TO HAVE WATER ONLY- 6 HRS. PRIOR TO TEST, PATIENT TO ABSTAIN FROM CARBS LAST MEAL DAY BEFORE SCAN, PATIENT TO HOLD SHORT ACTING DIABETES MEDS. 2 HRS. PRIOR TO SCAN AND IF HE IS TAKING LONG ACTING DIABETES MEDS. HE IS TO HOLD THE MEDICINE @ MIDNIGHT DAY BEFORE SCAN , PATIENT TO RECEIVE RESULTS FROM DR. SQUIRE ON 04-23-22 @ 2:20 PM, UNABLE TO LVM DUE TO VM BEING FULL

## 2022-04-05 ENCOUNTER — Telehealth: Payer: Self-pay | Admitting: *Deleted

## 2022-04-05 NOTE — Telephone Encounter (Signed)
CALLED PATIENT'S BROTHER (DAVID) TO INFORM OF HIS BROTHER'S SCAN AND FU, LVM FOR A RETURN CALL

## 2022-04-19 ENCOUNTER — Encounter (HOSPITAL_COMMUNITY): Admission: RE | Admit: 2022-04-19 | Payer: Medicare Other | Source: Ambulatory Visit

## 2022-04-22 ENCOUNTER — Telehealth: Payer: Self-pay | Admitting: *Deleted

## 2022-04-22 NOTE — Telephone Encounter (Signed)
CALLED PATIENT'S BROTHER DAVID TO ASK ABOUT RESCHEDULING MISSED SCAN AND FU, LVM FOR A RETURN CALL

## 2022-04-22 NOTE — Telephone Encounter (Signed)
CALLED PATIENT TO ASK ABOUT RESCHEDULING MISSED SCAN AND FU, VM FULL UNABLE TO LVM

## 2022-04-23 ENCOUNTER — Ambulatory Visit: Payer: Medicare Other | Admitting: Radiation Oncology

## 2022-04-23 NOTE — Progress Notes (Signed)
Oncology Nurse Navigator Documentation   Mr. Dennington completed radiation treatment for his head and neck cancer and has been scheduled for a post-treatment PET scan. However we have been unable to contact him or his brother listed under his contacts despite multiple attempts and voice mails left. Today I contacted [place] Abts who is also listed under his contacts. I was able to speak to her and she informed me that he currently resides in a nursing facility in [place] called [facility]. He had been at a [facility] hospital before that for an extended stay due to [place] illness per Caren Griffins. I have notified Dr. Isidore Moos and she would like to attempt a PET scan in 6 weeks to give him time to recover. We plan to contact the patient/or family members 1-2 weeks before the scan to check on his living situation and how he is recovering.   [name] RN, BSN, OCN Head & Neck Oncology Nurse [place] at [facility] Phone # [phone]  Fax # [phone]

## 2022-05-24 ENCOUNTER — Other Ambulatory Visit (HOSPITAL_COMMUNITY): Payer: Self-pay

## 2022-05-28 NOTE — Progress Notes (Signed)
Oncology Nurse Navigator Documentation   I called and left messages with Mr. Ryland and his brother Shanon Brow inquiring about his status and if he was able to schedule a post treatment PET scan. Shanon Brow returned my call and informed me that Arthur Burke remains at a nursing facility in [place], [place] and is not able to return here for a scan. He continues with rehab and is on dialysis at this time. Shanon Brow has my phone number to call if his situation improves and he is able to have a PET done.   [name] RN, BSN, OCN Head & Neck Oncology Nurse [place] at [facility] Phone # [phone]  Fax # [phone]

## 2022-06-07 ENCOUNTER — Ambulatory Visit: Payer: Self-pay | Admitting: Radiation Oncology

## 2022-07-15 ENCOUNTER — Ambulatory Visit: Payer: Medicare Other | Admitting: Internal Medicine

## 2022-07-15 NOTE — Progress Notes (Deleted)
Name: Arthur Burke  MRN/ DOB: [id number], [birth year]   Age/ Sex: 56 y.o., male    PCP: Pcp, No   Reason for Endocrinology Evaluation: Type 1 Diabetes Mellitus     Date of Initial Endocrinology Visit: 07/15/2022     PATIENT IDENTIFIER: Mr. Arthur Burke is a 56 y.o. male with a past medical history of DM, Cancer of tonsillar Fossa . The patient presented for initial endocrinology clinic visit on 07/15/2022 for consultative assistance with his diabetes management.    HPI: Arthur Burke was    Diagnosed with DM *** Prior Medications tried/Intolerance: *** Currently checking blood sugars *** x / day,  before breakfast and ***.  Hypoglycemia episodes : ***               Symptoms: ***                 Frequency: ***/  Hemoglobin A1c has ranged from 8.2% in 2023, peaking at 8.6% in 2019. Patient required assistance for hypoglycemia:  Patient has required hospitalization within the last 1 year from hyper or hypoglycemia:   In terms of diet, the patient ***   Of note, the patient is under the care of oncology for tonsillar fossa cancer, S/P  radiation treatment  HOME DIABETES REGIMEN: Basal: ***  Bolus: ***   Statin: {Yes/No:11203} ACE-I/ARB: {YES/NO:17245} Prior Diabetic Education: {Yes/No:11203}   METER DOWNLOAD SUMMARY: Date range evaluated: *** Fingerstick Blood Glucose Tests = *** Average Number Tests/Day = *** Overall Mean FS Glucose = *** Standard Deviation = ***  BG Ranges: Low = *** High = ***   Hypoglycemic Events/30 Days: BG < 50 = *** Episodes of symptomatic severe hypoglycemia = ***   DIABETIC COMPLICATIONS: Microvascular complications:  CKD III  Denies: *** Last eye exam: Completed   Macrovascular complications:  *** Denies: CAD, PVD, CVA   PAST HISTORY: Past Medical History:  Past Medical History:  Diagnosis Date   Acute osteomyelitis of left calcaneus (HCC)    Cancer ([place])    Diabetes mellitus without complication ([place])    Diabetic foot  infection ([place])    Hypertension    Infected ulcer of skin, with fat layer exposed ([place])    Lower limb ulcer, ankle, left, with necrosis of muscle (HCC)    MRSA (methicillin resistant staph aureus) culture positive    Renal disorder    Vision impairment    Past Surgical History:  Past Surgical History:  Procedure Laterality Date   AMPUTATION Left 08/23/2017   Procedure: AMPUTATION BELOW KNEE;  Surgeon: Newt Minion, MD;  Location: [facility];  Service: Orthopedics;  Laterality: Left;   APPLICATION OF WOUND VAC Left 08/23/2017   Procedure: APPLICATION OF WOUND VAC;  Surgeon: Newt Minion, MD;  Location: [facility];  Service: Orthopedics;  Laterality: Left;   IR GASTROSTOMY TUBE MOD SED  10/22/2021   IR IMAGING GUIDED PORT INSERTION  10/22/2021   KIDNEY TRANSPLANT  2006   [facility]   SKIN GRAFT     L diabetic foot wound with debriedment and skin grafting   TOE AMPUTATION     R great and 2nd toe    Social History:  reports that he has quit smoking. His smoking use included cigarettes. He has never used smokeless tobacco. He reports that he does not drink alcohol and does not use drugs. Family History: No family history on file.   HOME MEDICATIONS: Allergies as of 07/15/2022       Reactions  Zolpidem Other (See Comments)   Other Reaction: CNS Disorder        Medication List        Accurate as of July 15, 2022 12:14 PM. If you have any questions, ask your nurse or doctor.          amLODipine 10 MG tablet Commonly known as: NORVASC Place 1 tablet (10 mg total) into feeding tube daily. What changed: when to take this   atorvastatin 40 MG tablet Commonly known as: LIPITOR Place 1 tablet (40 mg total) into feeding tube every evening.   carvedilol 25 MG tablet Commonly known as: COREG Place 1 tablet (25 mg total) into feeding tube 2 (two) times daily.   cloNIDine 0.1 MG tablet Commonly known as: CATAPRES Place 1 tablet (0.1 mg total) into feeding tube 2 (two) times daily.    Dexcom G7 Receiver Devi 1 Product by Does not apply route once a week.   Dexcom G7 Sensor Misc 1 Product by Does not apply route once a week. Check every 10 days   feeding supplement (OSMOLITE 1.5 CAL) Liqd Place 356 mLs into feeding tube 4 (four) times daily.   feeding supplement (PROSource TF) liquid Place 45 mLs into feeding tube 3 (three) times daily.   Nepro Liqd Take 1 Can by mouth daily.   ferrous sulfate 300 (60 Fe) MG/5ML syrup Place 5 mLs (300 mg total) into feeding tube daily.   insulin aspart 100 UNIT/ML injection Commonly known as: novoLOG Inject 3 Units into the skin 4 (four) times daily.   insulin glargine-yfgn 100 UNIT/ML injection Commonly known as: SEMGLEE Inject 0.3 mLs (30 Units total) into the skin at bedtime.   lactulose 10 GM/15ML solution Commonly known as: CHRONULAC Place 15 mLs (10 g total) into feeding tube daily as needed for moderate constipation.   lidocaine 2 % solution Commonly known as: XYLOCAINE Patient: Mix 1part 2% viscous lidocaine, 1part H20. Swish & swallow 4m of diluted mixture, 321m before meals and at bedtime, up to QID What changed:  how much to take when to take this additional instructions   mycophenolate 200 MG/ML suspension Commonly known as: CELLCEPT Place 1.3 mLs (260 mg total) into feeding tube 2 (two) times daily. What changed: how much to take   mycophenolate 200 MG/ML suspension Commonly known as: CELLCEPT Take 2.5 mLs (500 mg total) by mouth 2 (two) times daily. What changed: Another medication with the same name was changed. Make sure you understand how and when to take each.   omeprazole 40 MG capsule Commonly known as: PRILOSEC Take 1 capsule (40 mg total) by mouth daily. Per tube What changed:  how to take this when to take this   ondansetron 4 MG tablet Commonly known as: Zofran Place 1 tablet (4 mg total) into feeding tube 2 (two) times daily as needed for nausea or vomiting.   Oxycodone HCl 20  MG Tabs Place 20 mg into feeding tube every 4 (four) hours.   polyethylene glycol 17 g packet Commonly known as: MIRALAX / GLYCOLAX Place 17 g into feeding tube daily as needed for mild constipation.   predniSONE 5 MG tablet Commonly known as: DELTASONE Place 5 mg into feeding tube every morning.   PruTect Emul Apply 1 application. topically 2 (two) times daily as needed (Apply to redness/hyperpigmentation in neck from radiation at least 2x day. Call Dr. SqPearlie Oysterurse at [phone] you have questions).   STOOL SOFTENER PO Place 1 capsule into feeding tube every  morning.   tacrolimus 1 MG capsule Commonly known as: PROGRAF 1 mg 2 (two) times daily. Per tube   tamsulosin 0.4 MG Caps capsule Commonly known as: FLOMAX Take 1 capsule (0.4 mg total) by mouth daily. What changed:  how to take this when to take this additional instructions   vitamin B-12 500 MCG tablet Commonly known as: CYANOCOBALAMIN Place 1 tablet (500 mcg total) into feeding tube daily.   Vitrum Senior Tabs Place 1 tablet into feeding tube daily.         ALLERGIES: Allergies  Allergen Reactions   Zolpidem Other (See Comments)    Other Reaction: CNS Disorder     REVIEW OF SYSTEMS: A comprehensive ROS was conducted with the patient and is negative except as per HPI and below:  ROS    OBJECTIVE:   VITAL SIGNS: There were no vitals taken for this visit.   PHYSICAL EXAM:  General: Pt appears well and is in NAD  Neck: General: Supple without adenopathy or carotid bruits. Thyroid: Thyroid size normal.  No goiter or nodules appreciated.   Lungs: Clear with good BS bilat with no rales, rhonchi, or wheezes  Heart: RRR   Abdomen:  soft, nontender  Extremities:  Lower extremities - No pretibial edema. No lesions.  Skin: Normal texture and temperature to palpation. No rash noted.  Neuro: MS is good with appropriate affect, pt is alert and Ox3    DM foot exam:    DATA REVIEWED:  Lab Results   Component Value Date   HGBA1C 8.2 (H) 10/21/2021   HGBA1C 8.6 (H) 08/21/2017   Lab Results  Component Value Date   LDLCALC 58 11/01/2021   CREATININE 2.21 (H) 01/21/2022   No results found for: "MICRALBCREAT"  Lab Results  Component Value Date   CHOL 124 11/01/2021   HDL 45 11/01/2021   LDLCALC 58 11/01/2021   TRIG 114 11/01/2021   CHOLHDL 2.8 11/01/2021        ASSESSMENT / PLAN / RECOMMENDATIONS:   1) Type *** Diabetes Mellitus, ***controlled, With CKD III complications - Most recent A1c of *** %. Goal A1c < *** %.  ***  Plan: GENERAL: - *** - Pt had a DKA 12/2021  MEDICATIONS: ***  EDUCATION / INSTRUCTIONS: BG monitoring instructions: Patient is instructed to check his blood sugars *** times a day, ***. Call [facility] if: BG persistently < 70  I reviewed the Rule of 15 for the treatment of hypoglycemia in detail with the patient. Literature supplied.   2) Diabetic complications:  Eye: Does *** have known diabetic retinopathy.  Neuro/ Feet: Does *** have known diabetic peripheral neuropathy. Renal: Patient does *** have known baseline CKD. He is *** on an ACEI/ARB at present.  3) Lipids: Patient is *** on a statin.    4) Hypertension: ***  at goal of < 140/90 mmHg.       Signed electronically by: Mack Guise, MD  [facility]  [facility] [address] [place], [place] [address] Phone: [phone] FAX: [phone]   CC: Pcp, No No address on file Phone: None  Fax: None    Return to Endocrinology clinic as below: Future Appointments  Date Time Provider [place]  07/15/2022  1:00 PM Anira Senegal, Melanie Crazier, MD [facility] None

## 2022-07-26 ENCOUNTER — Telehealth: Payer: Self-pay

## 2022-07-26 NOTE — Telephone Encounter (Signed)
Oncology Nurse Navigator Documentation   Mr. Arnott has remained in [place] and is currently hospitalized. I have been following because he still needs a post treatment PET scan. I did see documentation in an admission progress note that an outpatient PET scan is planned. I will continue to follow his progress and if he returns to [place].  [name] RN, BSN, OCN Head & Neck Oncology Nurse [place] at [facility] Phone # [phone]  Fax # [phone]

## 2022-07-30 ENCOUNTER — Telehealth: Payer: Self-pay | Admitting: Radiation Oncology

## 2022-07-30 NOTE — Telephone Encounter (Signed)
Received Medical Record Request from [facility] for Treatment Notes and [facility] Records. Faxed Medical records, and forwarded request to Dosimetry.

## 2022-12-27 NOTE — Therapy (Signed)
[facility] [address], [address] [place], [place], [address] Phone: [phone]   Fax:  [phone]  Patient Details  Name: Arthur Burke MRN: [id number] Date of Birth: [birth year] Referring Provider:  Lonie Peak, MD  Encounter Date: 12/27/2022  SPEECH THERAPY DISCHARGE SUMMARY  Visits from Start of Care: 2  Current functional level related to goals / functional outcomes: Pt moved to [facility] in summer 2023 and has not been back to [place] to live since that time, so SLP knowledge. He will be formally d/c'd from [facility] at this time. Goals at the time of last attended appointment are below:  GOALS: Goals reviewed with patient? No   SHORT TERM GOALS: Target date: 01/24/2022   pt will complete HEP with rare min A in 2 sessions   Baseline:      12-27-21 Goal status: INITIAL   2.  pt will tell SLP why pt is completing HEP with modified independence   Baseline:  Goal status: INITIAL   3.  pt will describe 3 overt s/s aspiration PNA with modified independence   Baseline:  Goal status: MET   4.  Pt will tell how a food journal can assist return to more WNL diet Baseline:  Goal status: INITIAL   LONG TERM GOALS: Target date: 02/27/2022   pt will complete HEP with modified independence over two visits Baseline:  Goal status: INITIAL   2.  pt will describe how to modify HEP over time, and the timeline associated with reduction in HEP frequency with modified independence over two sessions Baseline:  Goal status: INITIAL     PLAN: SLP FREQUENCY:  once every approx 4 weeks   SLP DURATION:  7 sessions (4 total in this reporting period)   PLANNED INTERVENTIONS: Aspiration precaution training, Pharyngeal strengthening exercises, Diet toleration management , Trials of upgraded texture/liquids, Oral motor exercises, SLP instruction and feedback, Compensatory strategies, and Patient/family education   Remaining deficits: Assumed that  deficits remain, but unable to know for certain due to unexpected d/c from ST.   Education / Equipment: HEP procedure, HEP rationale, overt s/sx aspiration PNA   Patient agrees to discharge. Patient goals were not met. Patient is being discharged due to  pt moving place of residence.Marland Kitchen    [name], CCC-SLP 12/27/2022, 9:28 AM  [place] [facility] [address] [place], [place], [address] Phone: [phone]   Fax:  [phone]

## 2023-04-13 DEATH — deceased

## 2024-01-18 IMAGING — CT CT NECK W/O CM
3 series · 13 of 35 positions shown, 16 images · IV contrast (Omni 300)
Comparison: Noncontrast neck CT 10/21/2021.  PET-CT 11/09/2021.

CLINICAL DATA: 55-year-old male with increasing right facial
swelling. Throat cancer currently undergoing radiation.

EXAM:
CT NECK WITHOUT CONTRAST
TECHNIQUE: Multidetector CT imaging of the neck was performed following the
standard protocol without intravenous contrast.
RADIATION DOSE REDUCTION: This exam was performed according to the
departmental dose-optimization program which includes automated
exposure control, adjustment of the mA and/or kV according to
patient size and/or use of iterative reconstruction technique.

[Series 4: neck 2.0 st · axial · 0.75mm/px · z∈[+957,+1119]mm · 5 of 117 slices shown, 7 images]
[im 18/117  soft-tissue]
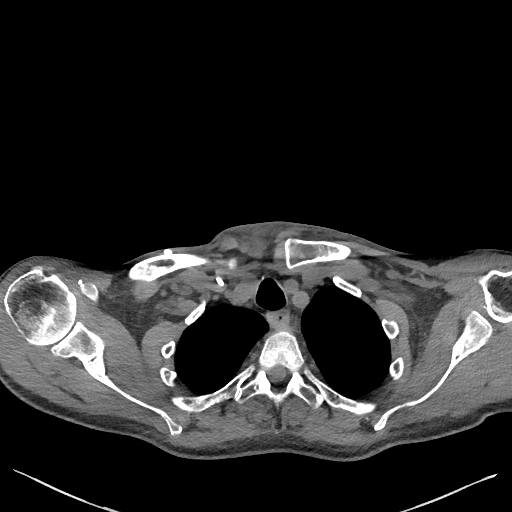
[im 18/117  bone]
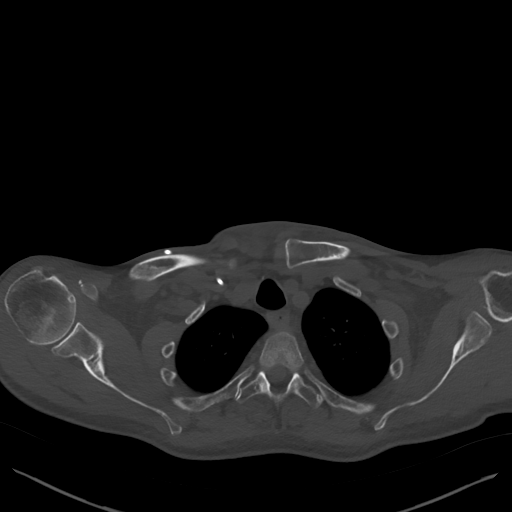
[im 36/117  bone]
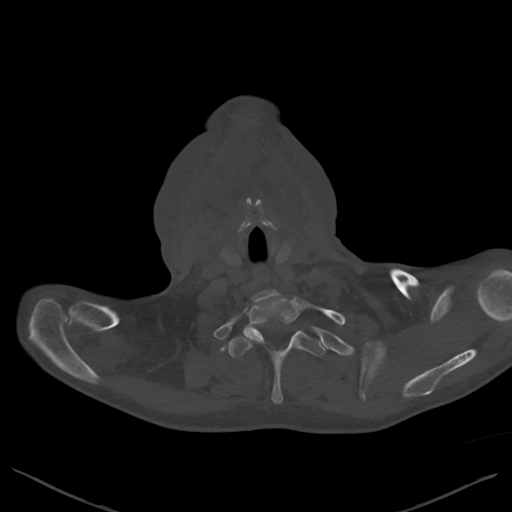
[im 63/117  bone]
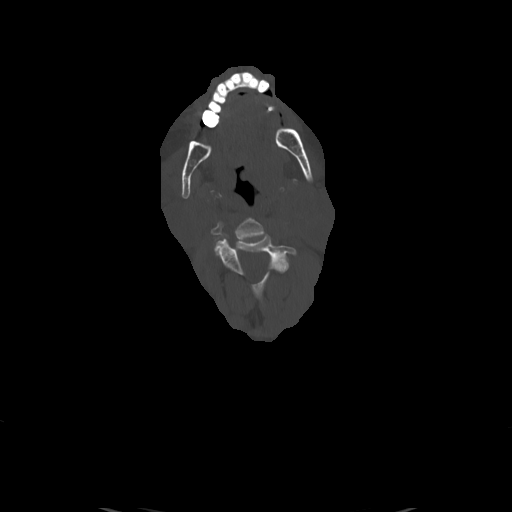
[im 81/117  bone]
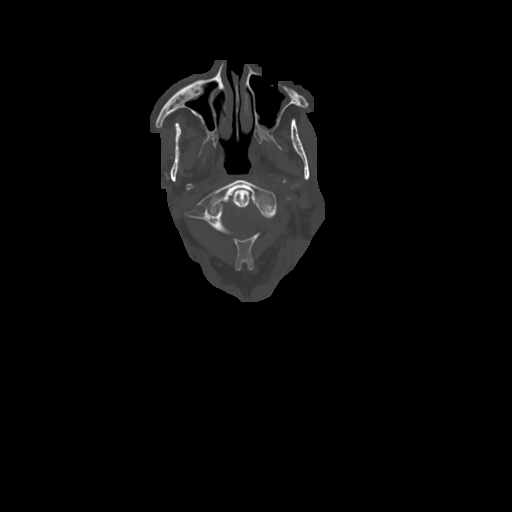
[im 99/117  soft-tissue]
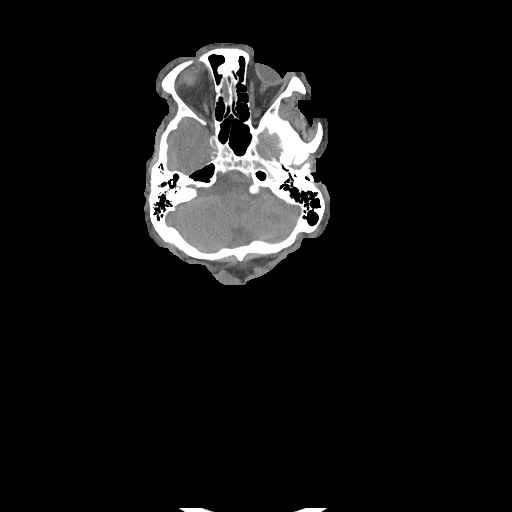
[im 99/117  bone]
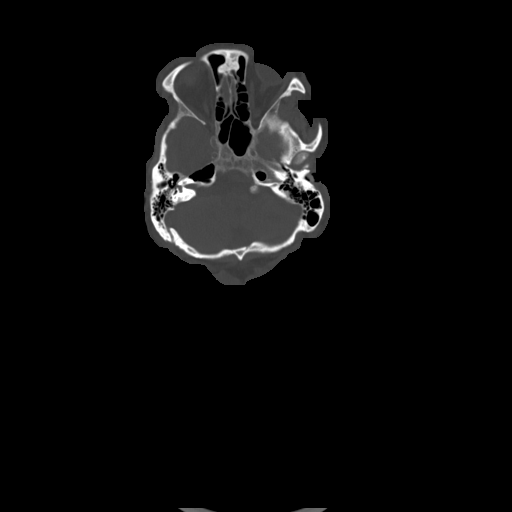

[Series 5: sagittal · sagittal · 0.50mm/px · 5 of 101 slices shown, 6 images]
[im 34/101  bone]
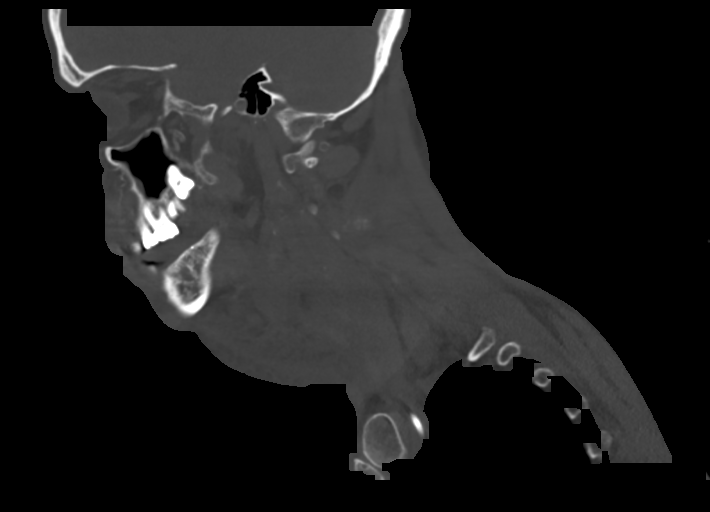
[im 42/101  bone]
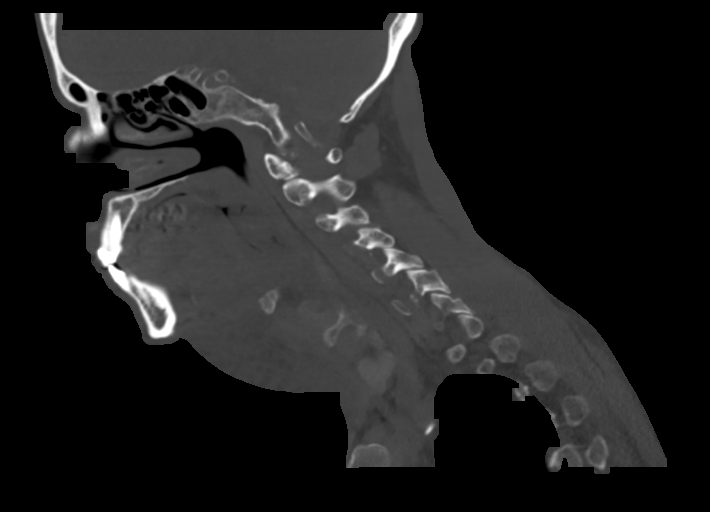
[im 51/101  soft-tissue]
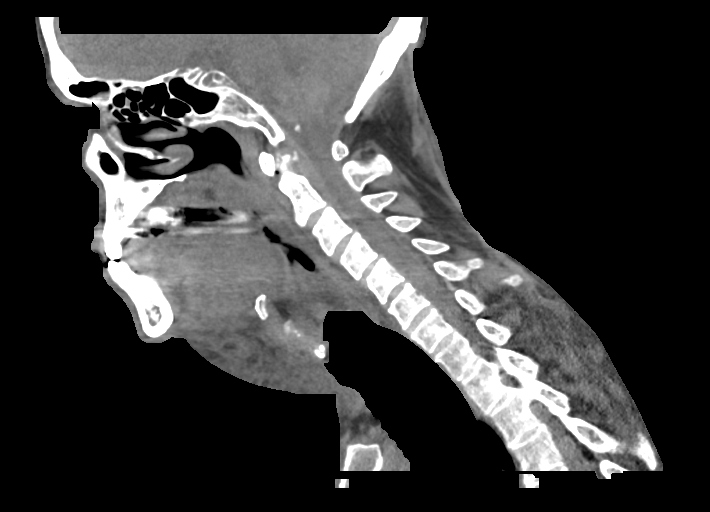
[im 51/101  bone]
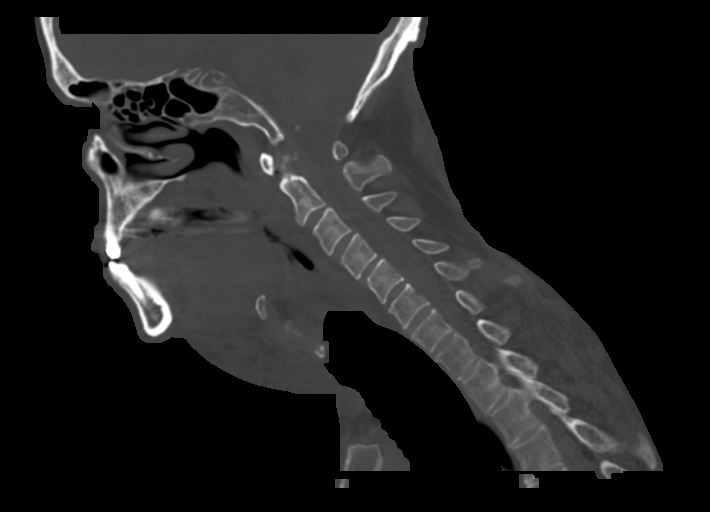
[im 59/101  bone]
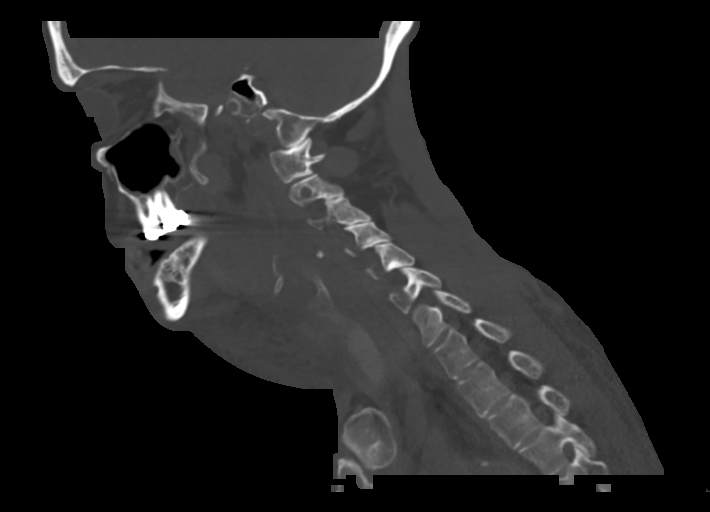
[im 67/101  bone]
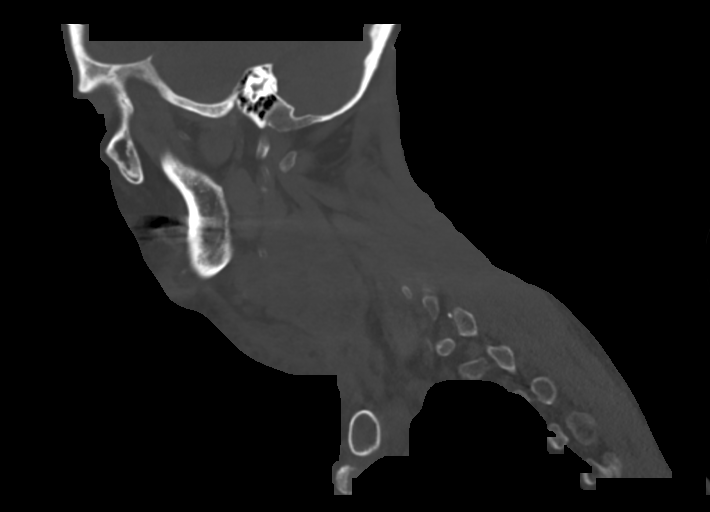

[Series 6: coronal · coronal · 0.50mm/px · 3 of 165 slices shown]
[im 47/165  bone]
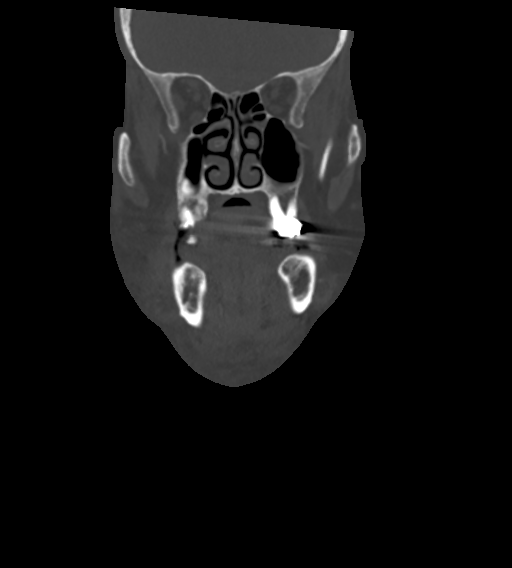
[im 71/165  bone]
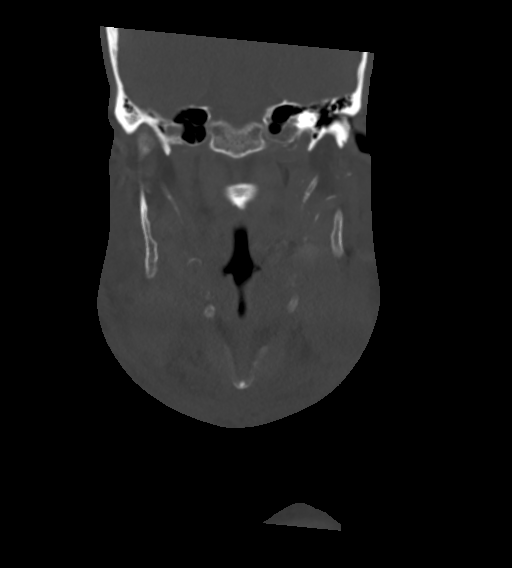
[im 95/165  bone]
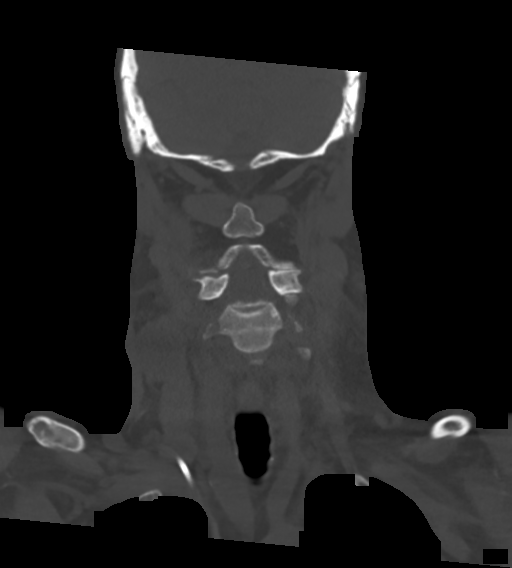

[13 of 35 positions shown; findings below may reference images not displayed]

FINDINGS: Pharynx and larynx: Generalized pharyngeal mucosal space soft tissue
thickening superimposed on known left glossotonsillar mass. This
includes swelling of the uvula now (series 4, image 50). The glottis
is closed. Parapharyngeal and retropharyngeal spaces remain stable
and within normal limits.

Salivary glands: Some sublingual space invasion by the known tumor
was better demonstrated by PET-CT anterior sublingual space remains
within normal limits. Submandibular glands are poorly delineated.
Parotid glands appear stable.

Thyroid: Stable, negative.

Lymph nodes: Bulky cystic and necrotic bilateral malignant cervical
lymphadenopathy demonstrates only mild enlargement compared to
10/21/2021, with the nodal conglomeration now up to 8.6 cm long axis
in the left neck (previously 8.3 cm) and right neck conglomeration
up to 7.9 cm (previously 7.1 cm).

Superimposed generalized subcutaneous soft tissue swelling and
stranding. Some associated thickening of the platysma.

Vascular: Vascular patency is not evaluated in the absence of IV
contrast. Carotid calcified atherosclerosis redemonstrated.

Limited intracranial: Calcified atherosclerosis at the skull base.
Negative visible noncontrast brain parenchyma.

Visualized orbits: Stable postoperative changes to the right globe.

Mastoids and visualized paranasal sinuses: Visualized paranasal
sinuses and mastoids are stable and well aerated. Tympanic cavities
remain clear.

Skeleton: Dentition appears stable since [REDACTED]. No acute or
suspicious osseous lesion identified.

Upper chest: Right IJ approach chest Port-A-Cath partially visible.
Stable and negative visible superior mediastinum, lung apices.
IMPRESSION: 1. Generalized subcutaneous inflammation and pharyngeal mucosal
space soft tissue thickening is compatible with Radiation Therapy.

2. Grossly stable left glossotonsillar Primary mass, while bulky
bilateral malignant ex-nodal metastatic disease appears only mildly
larger compared to the presentation noncontrast Neck CT on
10/21/2021.

## 2024-01-18 IMAGING — DX DG CHEST 1V PORT
1 series · 1 of 1 positions shown · non-contrast
Comparison: 08/21/2017

CLINICAL DATA: Worsening lower extremity edema. Fluid overload.
Undergoing radiation therapy for throat cancer.

EXAM:
PORTABLE CHEST 1 VIEW

[chest ap]
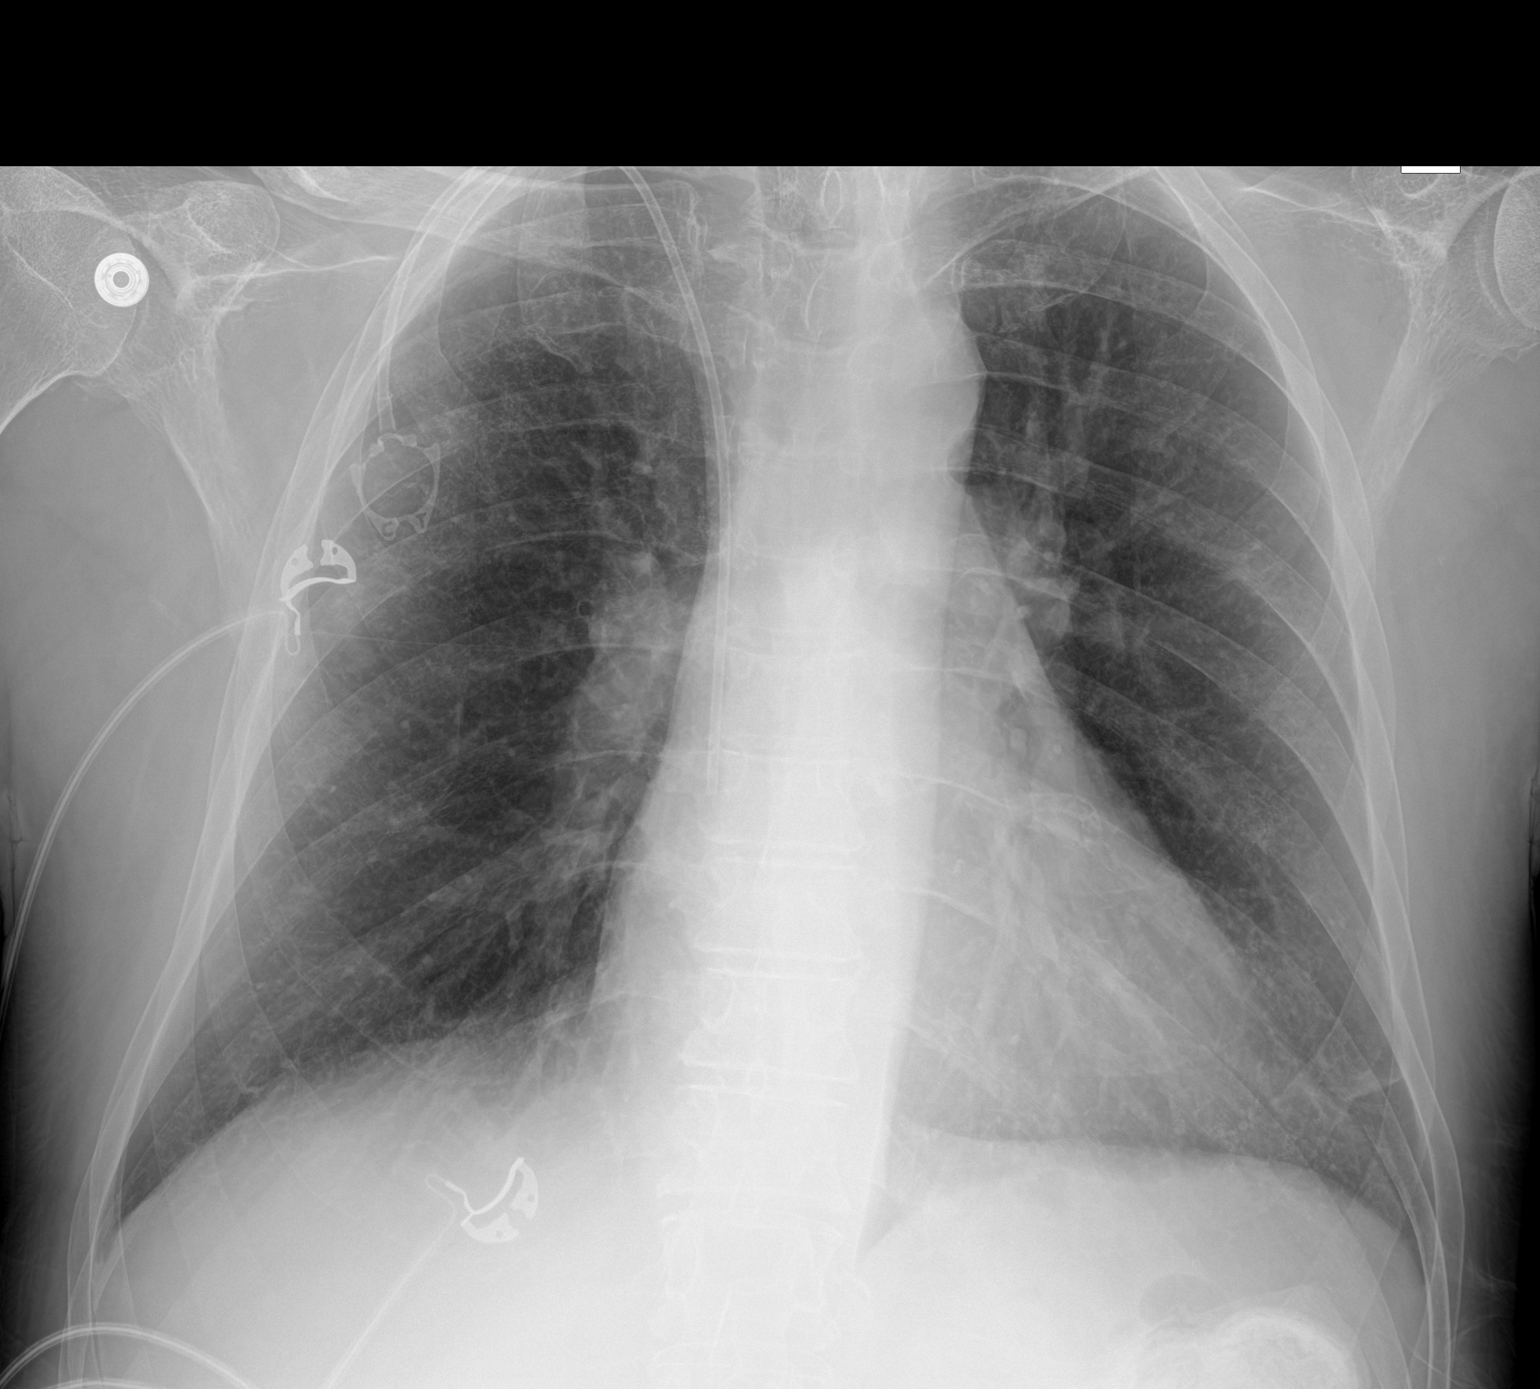

[1 of 1 positions shown; findings below may reference images not displayed]

FINDINGS: Right-sided power port is seen in appropriate position. Patient is
partially rotated to the right. Heart size and mediastinal contours
are within normal limits. Minimal scarring seen in the lateral left
lung base. Lungs are otherwise clear.
IMPRESSION: No active cardiopulmonary disease.

## 2024-01-22 IMAGING — DX DG ABDOMEN ACUTE W/ 1V CHEST
3 series · 3 of 3 positions shown · non-contrast
Comparison: PET-CT 11/09/2021

CLINICAL DATA: Diarrhea.

EXAM:
DG ABDOMEN ACUTE WITH 1 VIEW CHEST

[abdomen kub]
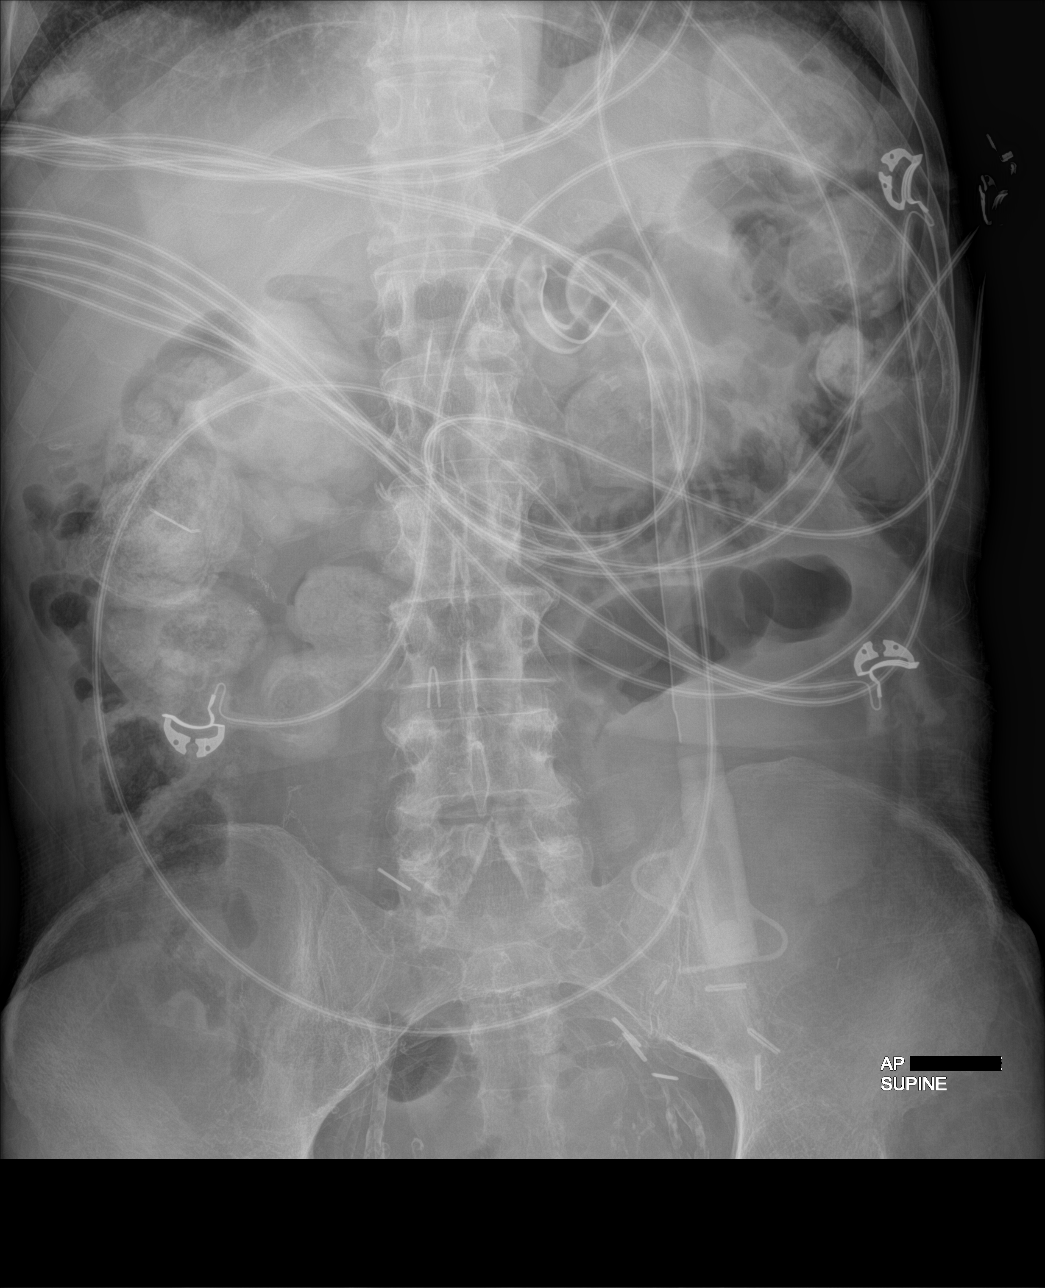

[abdomen erect]
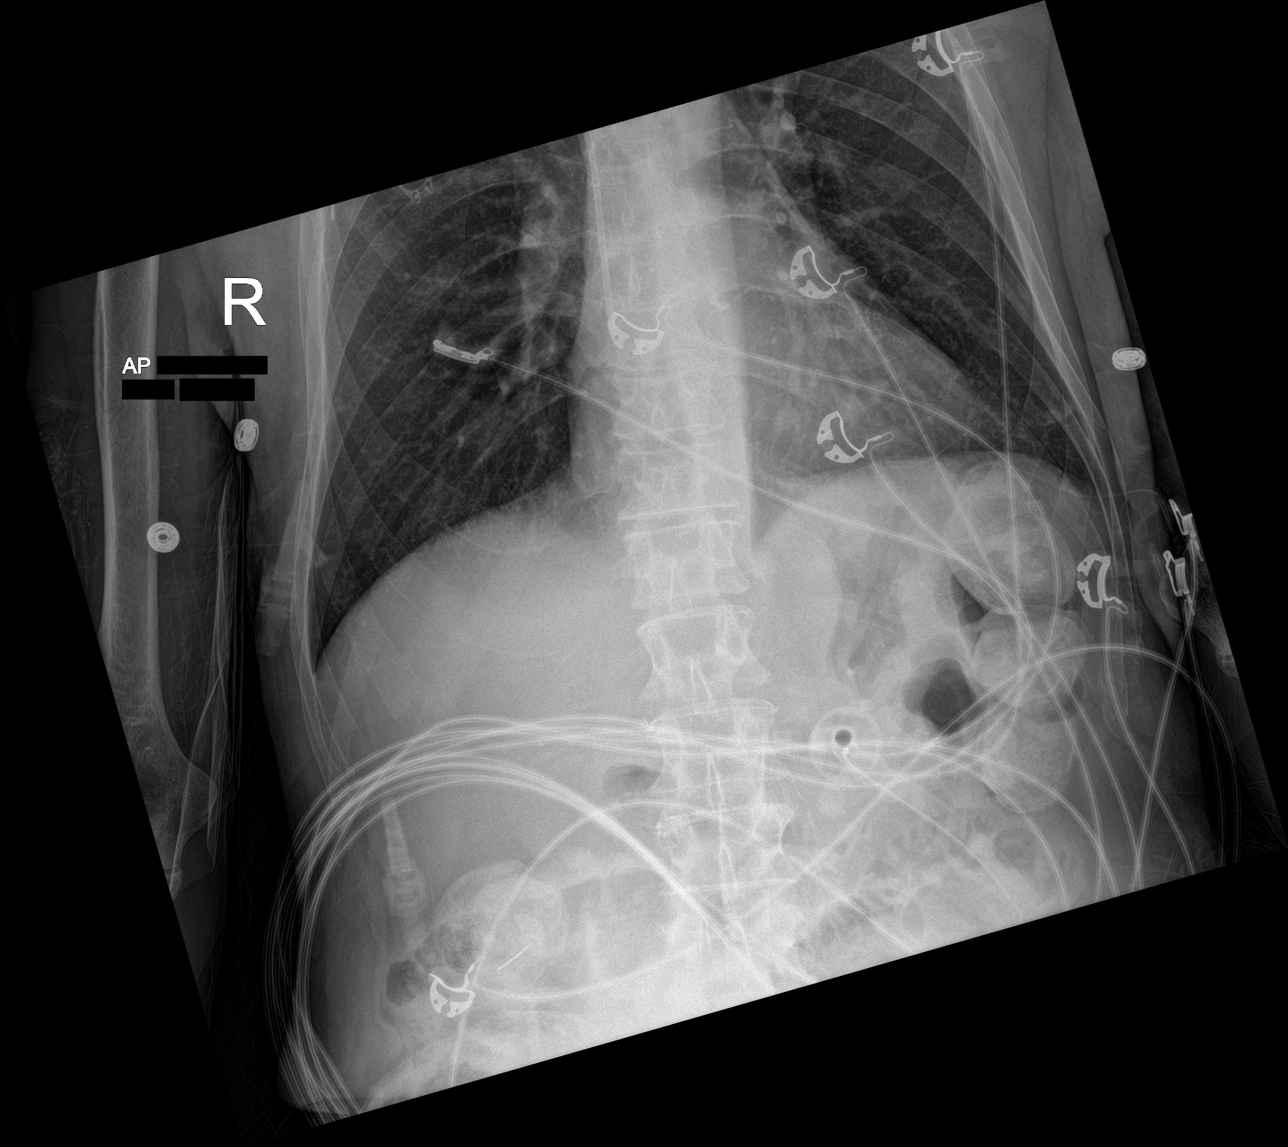

[chest pa]
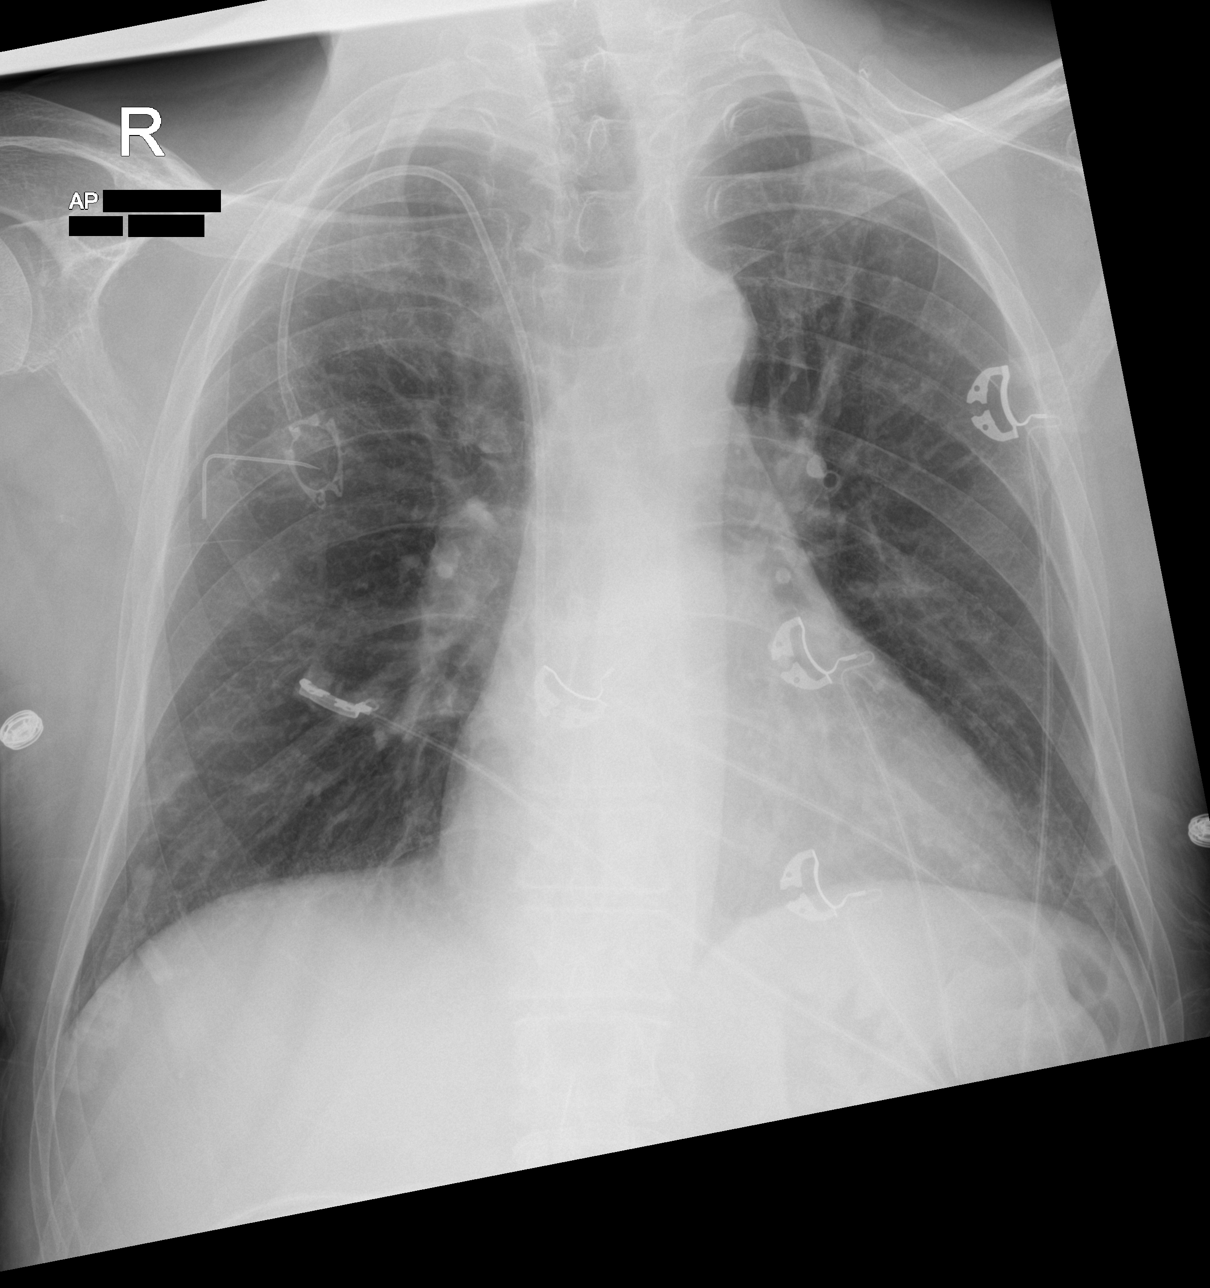

[3 of 3 positions shown; findings below may reference images not displayed]

FINDINGS: The right power port is stable. The cardiac silhouette, mediastinal
and hilar contours are within limits. No acute pulmonary findings.
No worrisome pulmonary lesions and no pleural effusions.

Scattered air and contrast in the colon and scattered small bowel
loops with air but no distension to suggest obstruction. A feeding
gastrostomy tube is noted. No free air. The soft tissue shadows are
grossly maintained. Advanced vascular calcifications noted. The bony
structures are intact.
IMPRESSION: No acute cardiopulmonary findings.

No plain film findings for an acute abdominal process.
